# Patient Record
Sex: Female | Born: 1960 | Race: Black or African American | Hispanic: No | Marital: Married | Smoking: Current some day smoker
Health system: Southern US, Community
[De-identification: ages and names within clinical notes are randomized; demographics above are authoritative.]

## PROBLEM LIST (undated history)

## (undated) DIAGNOSIS — R42 Dizziness and giddiness: Secondary | ICD-10-CM

## (undated) DIAGNOSIS — G4730 Sleep apnea, unspecified: Secondary | ICD-10-CM

## (undated) DIAGNOSIS — I10 Essential (primary) hypertension: Secondary | ICD-10-CM

## (undated) DIAGNOSIS — E119 Type 2 diabetes mellitus without complications: Secondary | ICD-10-CM

## (undated) HISTORY — PX: UMBILICAL HERNIA REPAIR: SHX196

## (undated) HISTORY — PX: HERNIA REPAIR: SHX51

## (undated) HISTORY — PX: ABDOMINAL HYSTERECTOMY: SHX81

---

## 2004-03-20 ENCOUNTER — Other Ambulatory Visit: Payer: Self-pay

## 2004-12-01 ENCOUNTER — Emergency Department: Payer: Self-pay | Admitting: Emergency Medicine

## 2004-12-01 ENCOUNTER — Other Ambulatory Visit: Payer: Self-pay

## 2005-02-25 ENCOUNTER — Emergency Department: Payer: Self-pay | Admitting: Internal Medicine

## 2005-02-25 ENCOUNTER — Other Ambulatory Visit: Payer: Self-pay

## 2007-04-19 ENCOUNTER — Emergency Department: Payer: Self-pay | Admitting: Internal Medicine

## 2007-04-19 ENCOUNTER — Other Ambulatory Visit: Payer: Self-pay

## 2009-03-13 ENCOUNTER — Emergency Department: Payer: Self-pay | Admitting: Unknown Physician Specialty

## 2013-11-15 ENCOUNTER — Emergency Department: Payer: Self-pay | Admitting: Emergency Medicine

## 2013-11-15 LAB — URINALYSIS, COMPLETE
Bacteria: NONE SEEN
Bilirubin,UR: NEGATIVE
Blood: NEGATIVE
Glucose,UR: NEGATIVE mg/dL (ref 0–75)
Ketone: NEGATIVE
Leukocyte Esterase: NEGATIVE
Nitrite: NEGATIVE
Ph: 5 (ref 4.5–8.0)
Protein: NEGATIVE
RBC,UR: 1 /HPF (ref 0–5)
Specific Gravity: 1.024 (ref 1.003–1.030)
Squamous Epithelial: 1
WBC UR: 2 /HPF (ref 0–5)

## 2014-06-06 DIAGNOSIS — K429 Umbilical hernia without obstruction or gangrene: Secondary | ICD-10-CM | POA: Insufficient documentation

## 2014-07-06 ENCOUNTER — Ambulatory Visit: Payer: Self-pay | Admitting: Surgery

## 2014-07-06 LAB — POTASSIUM: Potassium: 3.7 mmol/L (ref 3.5–5.1)

## 2014-07-14 ENCOUNTER — Ambulatory Visit: Payer: Self-pay | Admitting: Surgery

## 2014-08-04 ENCOUNTER — Emergency Department: Payer: Self-pay | Admitting: Emergency Medicine

## 2014-08-04 LAB — CBC WITH DIFFERENTIAL/PLATELET
Basophil #: 0 10*3/uL (ref 0.0–0.1)
Basophil %: 0.4 %
Eosinophil #: 0.4 10*3/uL (ref 0.0–0.7)
Eosinophil %: 3.6 %
HCT: 44.4 % (ref 35.0–47.0)
HGB: 14.6 g/dL (ref 12.0–16.0)
Lymphocyte #: 5.2 10*3/uL — ABNORMAL HIGH (ref 1.0–3.6)
Lymphocyte %: 47.5 %
MCH: 28.1 pg (ref 26.0–34.0)
MCHC: 32.9 g/dL (ref 32.0–36.0)
MCV: 86 fL (ref 80–100)
Monocyte #: 0.8 x10 3/mm (ref 0.2–0.9)
Monocyte %: 7 %
Neutrophil #: 4.5 10*3/uL (ref 1.4–6.5)
Neutrophil %: 41.5 %
Platelet: 113 10*3/uL — ABNORMAL LOW (ref 150–440)
RBC: 5.19 10*6/uL (ref 3.80–5.20)
RDW: 14.4 % (ref 11.5–14.5)
WBC: 10.9 10*3/uL (ref 3.6–11.0)

## 2014-08-04 LAB — COMPREHENSIVE METABOLIC PANEL
Albumin: 3.2 g/dL — ABNORMAL LOW (ref 3.4–5.0)
Alkaline Phosphatase: 77 U/L (ref 46–116)
Anion Gap: 5 — ABNORMAL LOW (ref 7–16)
BUN: 11 mg/dL (ref 7–18)
Bilirubin,Total: 0.3 mg/dL (ref 0.2–1.0)
Calcium, Total: 9 mg/dL (ref 8.5–10.1)
Chloride: 105 mmol/L (ref 98–107)
Co2: 31 mmol/L (ref 21–32)
Creatinine: 0.8 mg/dL (ref 0.60–1.30)
EGFR (African American): >60
EGFR (Non-African Amer.): >60
Glucose: 113 mg/dL — ABNORMAL HIGH (ref 65–99)
Osmolality: 281 (ref 275–301)
Potassium: 4 mmol/L (ref 3.5–5.1)
SGOT(AST): 36 U/L (ref 15–37)
SGPT (ALT): 36 U/L (ref 14–63)
Sodium: 141 mmol/L (ref 136–145)
Total Protein: 7.7 g/dL (ref 6.4–8.2)

## 2014-08-07 LAB — WOUND CULTURE

## 2014-08-13 ENCOUNTER — Emergency Department: Payer: Self-pay | Admitting: Student

## 2014-10-29 NOTE — Op Note (Signed)
PATIENT NAME:  Karen SlickerAYLOR, Karen Potts MR#:  [id number] DATE OF BIRTH:  24-Oct-1960  DATE OF PROCEDURE:  07/14/2014  PREOPERATIVE DIAGNOSIS: Symptomatic umbilical hernia.   POSTOPERATIVE DIAGNOSIS: Symptomatic umbilical hernia.    PROCEDURE: Umbilical hernia repair.   SURGEON: Adella HareJ. Wilton Lela Gell, MD   ANESTHESIA: General.   INDICATIONS FOR PROCEDURE: This 54 year old female has recently felt a bulge at the navel and just slightly to the right of the midline, had some moderate painful discomfort and umbilical mass was palpated, and surgery was recommended for definitive treatment.   DESCRIPTION OF PROCEDURE: The patient was placed on the operating table in the supine position under general endotracheal anesthesia. The abdomen was very obese and was prepared with ChloraPrep, and applied ChloraPrep down deeply within the navel, and found that the navel appeared to be clean. The abdomen was draped in a sterile manner.   A transversely oriented supraumbilical incision was made some 7 cm in length, carried down deeply within the subcutaneous tissues, and I could further identify the extent of the navel. Dissection was carried down to the deep fascia. There was an umbilical hernia sac, which appeared to be approximately 4 cm in dimension and appeared to contain fluid, and was dissected free from surrounding structures up to the fascial ring defect. The defect itself appeared to be small in size. The sac was opened and suctioned. There was no bowel within the sac. A high ligation of the sac was done with a 0 Surgilon suture ligature. The fascial ring defect was only about 6 mm, and it was dilated with large enough to allow reduction of the stump of the hernia sac. Next, a repair was carried out with a 0 Surgilon figure-of-eight suture, which resolved the fascial defect. Next tissues surrounding the repair were infiltrated with 0.5% Sensorcaine with epinephrine, also subcutaneous tissues were infiltrated. The wound was  inspected and determined that hemostasis was intact. The skin was closed with a running 4-0 Monocryl subcuticular suture and LiquiBand. The patient appeared to tolerate the procedure satisfactorily and was prepared for transfer to the recovery room.    ____________________________ Shela CommonsJ. Renda RollsWilton Caidyn Henricksen, MD [email or web address] D: 07/14/2014 17:54:50 ET T: 07/14/2014 18:26:18 ET JOB#: [id number]  cc: Adella HareJ. Wilton Shyam Dawson, MD, <Dictator> Adella HareWILTON J Karter Hellmer MD ELECTRONICALLY SIGNED 07/15/2014 10:45

## 2015-02-15 ENCOUNTER — Emergency Department
Admission: EM | Admit: 2015-02-15 | Discharge: 2015-02-15 | Disposition: A | Discharge status: Home / Self Care | Payer: Worker's Compensation | Attending: Emergency Medicine | Admitting: Emergency Medicine

## 2015-02-15 ENCOUNTER — Encounter: Payer: Self-pay | Admitting: Emergency Medicine

## 2015-02-15 DIAGNOSIS — Y990 Civilian activity done for income or pay: Secondary | ICD-10-CM | POA: Diagnosis not present

## 2015-02-15 DIAGNOSIS — T1501XA Foreign body in cornea, right eye, initial encounter: Secondary | ICD-10-CM | POA: Diagnosis not present

## 2015-02-15 DIAGNOSIS — Y9289 Other specified places as the place of occurrence of the external cause: Secondary | ICD-10-CM | POA: Insufficient documentation

## 2015-02-15 DIAGNOSIS — Y9389 Activity, other specified: Secondary | ICD-10-CM | POA: Diagnosis not present

## 2015-02-15 DIAGNOSIS — X58XXXA Exposure to other specified factors, initial encounter: Secondary | ICD-10-CM | POA: Insufficient documentation

## 2015-02-15 DIAGNOSIS — T1591XA Foreign body on external eye, part unspecified, right eye, initial encounter: Secondary | ICD-10-CM

## 2015-02-15 DIAGNOSIS — Z720 Tobacco use: Secondary | ICD-10-CM | POA: Diagnosis not present

## 2015-02-15 MED ORDER — HYDROCODONE-ACETAMINOPHEN 5-325 MG PO TABS: 1.0000 | ORAL_TABLET | ORAL | Status: DC | PRN

## 2015-02-15 NOTE — ED Notes (Signed)
Patient to ED with report of getting metal in eye from work yesterday, patient reports has seen employee clinic and has been using eye wash most of day. Redness noted to right eye.

## 2015-02-15 NOTE — Discharge Instructions (Signed)
Eye, Foreign Body The term foreign body refers to any object near, on the surface of or in the eye that should not be there. A foreign body may be a small speck of dirt or dust, a hair or eyelash, a splinter or any object. CAUSES  Foreign bodies can get in the eye by:  Flying pieces of something that was broken or destroyed (debris).  A sudden injury (trauma) to the eye. SYMPTOMS  Symptoms depend on what the foreign body is and where it is in the eye. The most common locations are:  On the inner surface of the upper or lower eyelids or on the covering of the white part of the eye (conjunctiva). Symptoms in this location are:  Irritating and painful, especially when blinking.  Feeling like something is in the eye.  On the surface of the clear covering on the front of the eye (cornea). A corneal foreign body has symptoms that:  Are painful and irritating since the cornea is very sensitive.  Form small "rust rings" around a metallic foreign body. Metallic foreign bodies stick more firmly to the surface of the cornea.  Inside the eyeball. Infection can happen fast and can be hard to treat with antibiotics. This is an extremely dangerous situation. Foreign bodies inside the eye can threaten vision. A person may even loose their eye. Foreign bodies inside the eye may cause:  Great pain.  Immediate loss of vision. DIAGNOSIS  Foreign bodies are found during an exam by an eye specialist. Those that are on the eyelids, conjunctiva or cornea are usually (but not always) easily found. When a foreign body is inside the eyeball, a cataract may form almost right away. This makes it hard for an ophthalmologist to find the foreign body. Special tests may be needed, including ultrasound testing, X-rays and CT scans. TREATMENT   Foreign bodies that are on the eyelids, conjunctiva or cornea are often removed easily and painlessly.  If the foreign body has caused a scratch or abrasion of the cornea,  antibiotic drops, ointments and/or a tight patch called a "pressure patch" may be needed. Follow-up exams will be needed for several days until the abrasion heals.  Surgery is needed right away if the foreign body is inside the eyeball. This is a medical emergency. An antibiotic therapy will likely be given to stop an infection. HOME CARE INSTRUCTIONS  The use of eye patches is not universal. Their use varies from state to state and from caregiver to caregiver. If an eye patch was applied:  Keep the eye patch on for as long as directed by your caregiver until the follow-up appointment.  Do not remove the patch to put in medications unless instructed to do so. When replacing the patch, retape it as it was before. Follow the same procedure if the patch becomes loose.  WARNING: Do not drive or operate machinery while the eye is patched. The ability to judge distances will be impaired.  Only take over-the-counter or prescription medicines for pain, discomfort or fever as directed by the caregiver. If no eye patch was applied:  Keep the eye closed as much as possible. Do not rub the eye.  Wear dark glasses as needed to protect the eyes from bright light.  Do not wear contact lenses until the eye feels normal again, or as instructed.  Wear protective eye covering if there is a risk of eye injury. This is important when working with high speed tools.  Only take over-the-counter or   prescription medicines for pain, discomfort or fever as directed by the caregiver. SEEK IMMEDIATE MEDICAL CARE IF:   Pain increases in the eye or the vision changes.  You or your child has problems with the eye patch.  The injury to the eye appears to be getting larger.  There is discharge from the injured eye.  Swelling and/or soreness (inflammation) develops around the affected eye.  You or your child has an oral temperature above 102 F (38.9 C), not controlled by medicine.  Your baby is older than 3  months with a rectal temperature of 102 F (38.9 C) or higher.  Your baby is 3 months old or younger with a rectal temperature of 100.4 F (38 C) or higher. MAKE SURE YOU:   Understand these instructions.  Will watch your condition.  Will get help right away if you are not doing well or get worse. Document Released: 06/16/2005 Document Revised: 09/08/2011 Document Reviewed: 11/11/2012 ExitCare Patient Information 2015 [employer]. This information is not intended to replace advice given to you by your health care provider. Make sure you discuss any questions you have with your health care provider.  

## 2015-02-15 NOTE — ED Provider Notes (Signed)
[facility] Emergency Department Provider Note  ____________________________________________  Time seen: Approximately 6:07 PM  I have reviewed the triage vital signs and the nursing notes.   HISTORY  Chief Complaint Foreign Body in Eye    HPI Karen Potts is a 54 y.o. 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 Patient complains of continuous right hip pain. Despite irrigation   History reviewed. No pertinent past medical history.  There are no active problems to display for this patient.   History reviewed. No pertinent past surgical history.  Current Outpatient Rx  Name  Route  Sig  Dispense  Refill  . HYDROcodone-acetaminophen (NORCO) 5-325 MG per tablet   Oral   Take 1-2 tablets by mouth every 4 (four) hours as needed for moderate pain.   15 tablet   0     Allergies Review of patient's allergies indicates no known allergies.  History reviewed. No pertinent family history.  Social History Social History  Substance Use Topics  . Smoking status: Current Every Day Smoker  . Smokeless tobacco: None  . Alcohol Use: No    Review of Systems Constitutional: No fever/chills Eyes: No visual changes. Positive right eye irritation with redness. ENT: No sore throat. Cardiovascular: Denies chest pain. Respiratory: Denies shortness of breath. Gastrointestinal: No abdominal pain.  No nausea, no vomiting.  No diarrhea.  No constipation. Genitourinary: Negative for dysuria. Musculoskeletal: Negative for back pain. Skin: Negative for rash. Neurological: Negative for headaches, focal weakness or numbness.  10-point ROS otherwise negative.  ____________________________________________   PHYSICAL EXAM:  VITAL SIGNS: ED Triage Vitals  Enc Vitals Group     BP 02/15/15 1632 168/95 mmHg     Pulse Rate 02/15/15 1632 59     Resp 02/15/15 1632 18     Temp 02/15/15 1632  98.5 F (36.9 C)     Temp Source 02/15/15 1632 Oral     SpO2 02/15/15 1632 98 %     Weight 02/15/15 1632 300 lb (136.079 kg)     Height 02/15/15 1632  (1.702 m)     Head Cir --      Peak Flow --      Pain Score 02/15/15 1633 7     Pain Loc --      Pain Edu? --      Excl. in GC? --     Constitutional: Alert and oriented. Well appearing and in no acute distress. Eyes: Conjunctivae are normal. PERRL. EOMI. cornea with noted foreign body embedded at approximately 12:00 minimal rust ring noted. Neurologic:  Normal speech and language. No gross focal neurologic deficits are appreciated. No gait instability. Skin:  Skin is warm, dry and intact. No rash noted. Psychiatric: Mood and affect are normal. Speech and behavior are normal.  ____________________________________________   LABS (all labs ordered are listed, but only abnormal results are displayed)  Labs Reviewed - No data to display ____________________________________________  PROCEDURES  Procedure(s) performed: None  Critical Care performed: No  ____________________________________________   INITIAL IMPRESSION / ASSESSMENT AND PLAN / ED COURSE  Pertinent labs & imaging results that were available during my care of the patient were reviewed by me and considered in my medical decision making (see chart for details).  Tetracaine applied to the right eye with improvement in pain. Rust ring noted referred to ophthalmology on-call. Patient to be seen first thing in the morning at the medical  office. She voices understanding. Rx given for hydrocodone as needed for pain and follow up with Dr. Georgianne Fick. ____________________________________________   FINAL CLINICAL IMPRESSION(S) / ED DIAGNOSES  Final diagnoses:  Foreign body of right eye, initial encounter      Evangeline Dakin, PA-C 02/15/15 1809  Phineas Semen, MD 02/15/15 2141

## 2015-03-27 ENCOUNTER — Encounter: Payer: Self-pay | Admitting: Emergency Medicine

## 2015-03-27 ENCOUNTER — Emergency Department
Admission: EM | Admit: 2015-03-27 | Discharge: 2015-03-27 | Disposition: A | Discharge status: Home / Self Care | Payer: PRIVATE HEALTH INSURANCE | Attending: Emergency Medicine | Admitting: Emergency Medicine

## 2015-03-27 DIAGNOSIS — Z720 Tobacco use: Secondary | ICD-10-CM | POA: Diagnosis not present

## 2015-03-27 DIAGNOSIS — K6289 Other specified diseases of anus and rectum: Secondary | ICD-10-CM | POA: Diagnosis present

## 2015-03-27 DIAGNOSIS — I10 Essential (primary) hypertension: Secondary | ICD-10-CM | POA: Diagnosis not present

## 2015-03-27 DIAGNOSIS — K649 Unspecified hemorrhoids: Secondary | ICD-10-CM | POA: Diagnosis not present

## 2015-03-27 HISTORY — DX: Essential (primary) hypertension: I10

## 2015-03-27 MED ORDER — LIDOCAINE-HYDROCORTISONE ACE 3-1 % RE KIT: 1.0000 "application " | PACK | Freq: Two times a day (BID) | RECTAL | Status: DC

## 2015-03-27 NOTE — Discharge Instructions (Signed)
Please seek medical attention for any high fevers, chest pain, shortness of breath, change in behavior, persistent vomiting, bloody stool or any other new or concerning symptoms. ° ° ° °Hemorrhoids °Hemorrhoids are swollen veins around the rectum or anus. There are two types of hemorrhoids:  °· Internal hemorrhoids. These occur in the veins just inside the rectum. They may poke through to the outside and become irritated and painful. °· External hemorrhoids. These occur in the veins outside the anus and can be felt as a painful swelling or hard lump near the anus. °CAUSES °· Pregnancy.   °· Obesity.   °· Constipation or diarrhea.   °· Straining to have a bowel movement.   °· Sitting for long periods on the toilet. °· Heavy lifting or other activity that caused you to strain. °· Anal intercourse. °SYMPTOMS  °· Pain.   °· Anal itching or irritation.   °· Rectal bleeding.   °· Fecal leakage.   °· Anal swelling.   °· One or more lumps around the anus.   °DIAGNOSIS  °Your caregiver may be able to diagnose hemorrhoids by visual examination. Other examinations or tests that may be performed include:  °· Examination of the rectal area with a gloved hand (digital rectal exam).   °· Examination of anal canal using a small tube (scope).   °· A blood test if you have lost a significant amount of blood. °· A test to look inside the colon (sigmoidoscopy or colonoscopy). °TREATMENT °Most hemorrhoids can be treated at home. However, if symptoms do not seem to be getting better or if you have a lot of rectal bleeding, your caregiver may perform a procedure to help make the hemorrhoids get smaller or remove them completely. Possible treatments include:  °· Placing a rubber band at the base of the hemorrhoid to cut off the circulation (rubber band ligation).   °· Injecting a chemical to shrink the hemorrhoid (sclerotherapy).   °· Using a tool to burn the hemorrhoid (infrared light therapy).   °· Surgically removing the hemorrhoid  (hemorrhoidectomy).   °· Stapling the hemorrhoid to block blood flow to the tissue (hemorrhoid stapling).   °HOME CARE INSTRUCTIONS  °· Eat foods with fiber, such as whole grains, beans, nuts, fruits, and vegetables. Ask your doctor about taking products with added fiber in them (fiber supplements). °· Increase fluid intake. Drink enough water and fluids to keep your urine clear or pale yellow.   °· Exercise regularly.   °· Go to the bathroom when you have the urge to have a bowel movement. Do not wait.   °· Avoid straining to have bowel movements.   °· Keep the anal area dry and clean. Use wet toilet paper or moist towelettes after a bowel movement.   °· Medicated creams and suppositories may be used or applied as directed.   °· Only take over-the-counter or prescription medicines as directed by your caregiver.   °· Take warm sitz baths for 15-20 minutes, 3-4 times a day to ease pain and discomfort.   °· Place ice packs on the hemorrhoids if they are tender and swollen. Using ice packs between sitz baths may be helpful.   °¨ Put ice in a plastic bag.   °¨ Place a towel between your skin and the bag.   °¨ Leave the ice on for 15-20 minutes, 3-4 times a day.   °· Do not use a donut-shaped pillow or sit on the toilet for long periods. This increases blood pooling and pain.   °SEEK MEDICAL CARE IF: °· You have increasing pain and swelling that is not controlled by treatment or medicine. °· You have uncontrolled bleeding. °· You   have difficulty or you are unable to have a bowel movement. °· You have pain or inflammation outside the area of the hemorrhoids. °MAKE SURE YOU: °· Understand these instructions. °· Will watch your condition. °· Will get help right away if you are not doing well or get worse. °Document Released: 06/13/2000 Document Revised: 06/02/2012 Document Reviewed: 04/20/2012 °ExitCare® Patient Information ©2015 [employer]. This information is not intended to replace advice given to you by your health  care provider. Make sure you discuss any questions you have with your health care provider. ° °

## 2015-03-27 NOTE — ED Notes (Signed)
MD at bedside. 

## 2015-03-27 NOTE — ED Notes (Signed)
Pt arrived to the ED for complaints of rectal "burning and pain." Pt reports that her husband looked and states that it was ren and something hanging out. Pt also reports having diarrhea for 2 days.

## 2015-03-27 NOTE — ED Provider Notes (Signed)
[facility] Emergency Department Provider Note    ____________________________________________  Time seen: 2115  I have reviewed the triage vital signs and the nursing notes.   HISTORY  Chief Complaint Hemorrhoids  History limited by: Not Limited   HPI Karen Potts is a 54 y.o. female who presents to the emergency department today with concerns for hemorrhoids. The patient states that she first noticed something coming out and pain to her rectum today. This has been somewhat constant throughout the day. She states she has never had hemorrhoids in the past. She does state for the past couple of days she has had watery diarrhea. She states because of this she has been a longer time on the toilet than normal. The patient denies any GI bleeding or bleeding on wiping. She denies any fevers, nausea or vomiting.  Past Medical History  Diagnosis Date  . Hypertension     There are no active problems to display for this patient.   Past Surgical History  Procedure Laterality Date  . Umbilical hernia repair    . Abdominal hysterectomy      Current Outpatient Rx  Name  Route  Sig  Dispense  Refill  . HYDROcodone-acetaminophen (NORCO) 5-325 MG per tablet   Oral   Take 1-2 tablets by mouth every 4 (four) hours as needed for moderate pain.   15 tablet   0     Allergies Review of patient's allergies indicates no known allergies.  History reviewed. No pertinent family history.  Social History Social History  Substance Use Topics  . Smoking status: Current Every Day Smoker  . Smokeless tobacco: None  . Alcohol Use: No    Review of Systems  Constitutional: Negative for fever. Cardiovascular: Negative for chest pain. Respiratory: Negative for shortness of breath. Gastrointestinal: Negative for abdominal pain, vomiting. Positive for diarrhea Genitourinary: Negative for dysuria. Musculoskeletal: Negative for back pain. Skin: Negative for  rash. Neurological: Negative for headaches, focal weakness or numbness.  10-point ROS otherwise negative.  ____________________________________________   PHYSICAL EXAM:  VITAL SIGNS: ED Triage Vitals  Enc Vitals Group     BP 03/27/15 2054 155/68 mmHg     Pulse Rate 03/27/15 2054 55     Resp 03/27/15 2054 18     Temp 03/27/15 2054 97.9 F (36.6 C)     Temp src --      SpO2 03/27/15 2054 95 %     Weight 03/27/15 2054 295 lb (133.811 kg)     Height 03/27/15 2054  (1.702 m)     Head Cir --      Peak Flow --      Pain Score 03/27/15 2056 5   Constitutional: Alert and oriented. Well appearing and in no distress. Eyes: Conjunctivae are normal. PERRL. Normal extraocular movements. ENT   Head: Normocephalic and atraumatic.   Nose: No congestion/rhinnorhea.   Mouth/Throat: Mucous membranes are moist.   Neck: No stridor. Hematological/Lymphatic/Immunilogical: No cervical lymphadenopathy. Cardiovascular: Normal rate, regular rhythm.  No murmurs, rubs, or gallops. Respiratory: Normal respiratory effort without tachypnea nor retractions. Breath sounds are clear and equal bilaterally. No wheezes/rales/rhonchi. Gastrointestinal: Soft and nontender. No distention. There is no CVA tenderness. Rectal: Small hemorrhoid present, no evidence of thrombosis. No bleeding.  Musculoskeletal: Normal range of motion in all extremities. No joint effusions.   Neurologic:  Normal speech and language. No gross focal neurologic deficits are appreciated. Speech is normal.  Skin:  Skin is warm, dry and intact. No rash  noted. Psychiatric: Mood and affect are normal. Speech and behavior are normal. Patient exhibits appropriate insight and judgment.  ____________________________________________    LABS (pertinent positives/negatives)  None  ____________________________________________   EKG  None  ____________________________________________     RADIOLOGY  None   ____________________________________________   PROCEDURES  Procedure(s) performed: None  Critical Care performed: No  ____________________________________________   INITIAL IMPRESSION / ASSESSMENT AND PLAN / ED COURSE  Pertinent labs & imaging results that were available during my care of the patient were reviewed by me and considered in my medical decision making (see chart for details).  Patient presents to the emergency department today with concern for hemorrhoid. Physical exam is consistent with non thrombosed hemorrhoid. Discussed hemorrhoid care with patient.   ____________________________________________   FINAL CLINICAL IMPRESSION(S) / ED DIAGNOSES  Hemorrhoid  Phineas Semen, MD 03/27/15 2126

## 2016-02-06 ENCOUNTER — Other Ambulatory Visit: Payer: Self-pay | Admitting: Family Medicine

## 2016-02-07 NOTE — Telephone Encounter (Signed)
[employer] pt. Karen DillonEmily Drozdowski, CMA

## 2016-05-09 ENCOUNTER — Encounter: Payer: Self-pay | Admitting: Family Medicine

## 2016-08-14 ENCOUNTER — Other Ambulatory Visit: Payer: Self-pay | Admitting: Family Medicine

## 2016-08-14 DIAGNOSIS — I10 Essential (primary) hypertension: Secondary | ICD-10-CM

## 2016-08-14 MED ORDER — LISINOPRIL-HYDROCHLOROTHIAZIDE 10-12.5 MG PO TABS: 1.0000 | ORAL_TABLET | Freq: Every day | ORAL | 6 refills | Status: DC

## 2016-08-14 NOTE — Progress Notes (Signed)
Sent refill of BP meds to her pharmacy. Will continue [facility] with routine labs regularly.

## 2016-10-20 ENCOUNTER — Emergency Department
Admission: EM | Admit: 2016-10-20 | Discharge: 2016-10-21 | Disposition: A | Discharge status: Home / Self Care | Payer: PRIVATE HEALTH INSURANCE | Attending: Emergency Medicine | Admitting: Emergency Medicine

## 2016-10-20 ENCOUNTER — Emergency Department: Payer: PRIVATE HEALTH INSURANCE

## 2016-10-20 DIAGNOSIS — Z79899 Other long term (current) drug therapy: Secondary | ICD-10-CM | POA: Diagnosis not present

## 2016-10-20 DIAGNOSIS — I10 Essential (primary) hypertension: Secondary | ICD-10-CM | POA: Diagnosis not present

## 2016-10-20 DIAGNOSIS — F17200 Nicotine dependence, unspecified, uncomplicated: Secondary | ICD-10-CM | POA: Diagnosis not present

## 2016-10-20 DIAGNOSIS — R0602 Shortness of breath: Secondary | ICD-10-CM | POA: Diagnosis not present

## 2016-10-20 DIAGNOSIS — R0781 Pleurodynia: Secondary | ICD-10-CM | POA: Insufficient documentation

## 2016-10-20 DIAGNOSIS — R079 Chest pain, unspecified: Secondary | ICD-10-CM

## 2016-10-20 LAB — BASIC METABOLIC PANEL
Anion gap: 4 — ABNORMAL LOW (ref 5–15)
BUN: 17 mg/dL (ref 6–20)
CO2: 30 mmol/L (ref 22–32)
Calcium: 9.3 mg/dL (ref 8.9–10.3)
Chloride: 105 mmol/L (ref 101–111)
Creatinine, Ser: 0.78 mg/dL (ref 0.44–1.00)
GFR calc Af Amer: >60 mL/min (ref >60)
GFR calc non Af Amer: >60 mL/min (ref >60)
Glucose, Bld: 111 mg/dL — ABNORMAL HIGH (ref 65–99)
Potassium: 4.3 mmol/L (ref 3.5–5.1)
Sodium: 139 mmol/L (ref 135–145)

## 2016-10-20 LAB — HEPATIC FUNCTION PANEL
ALT: 17 U/L (ref 14–54)
AST: 39 U/L (ref 15–41)
Albumin: 3.8 g/dL (ref 3.5–5.0)
Alkaline Phosphatase: 54 U/L (ref 38–126)
Bilirubin, Direct: 0.3 mg/dL (ref 0.1–0.5)
Indirect Bilirubin: 0.6 mg/dL (ref 0.3–0.9)
Total Bilirubin: 0.9 mg/dL (ref 0.3–1.2)
Total Protein: 7.7 g/dL (ref 6.5–8.1)

## 2016-10-20 LAB — BRAIN NATRIURETIC PEPTIDE: B Natriuretic Peptide: 19 pg/mL (ref 0.0–100.0)

## 2016-10-20 LAB — FIBRIN DERIVATIVES D-DIMER (ARMC ONLY): Fibrin derivatives D-dimer (ARMC): 461.85 (ref 0.00–499.00)

## 2016-10-20 LAB — TROPONIN I: Troponin I: <0.03 ng/mL (ref <0.03)

## 2016-10-20 MED ORDER — KETOROLAC TROMETHAMINE 30 MG/ML IJ SOLN
15.0000 mg | Freq: Once | INTRAMUSCULAR | Status: AC
  Administered 2016-10-20: 15 mg via INTRAVENOUS
  Filled 2016-10-20: qty 1

## 2016-10-20 NOTE — ED Notes (Signed)
Pt arrived via ems for c/o chest pain on the left side of her chest with no radiation - pt denies N/V but does c/o shortness of breath with exertion - ems gave  of ASA and 1 ntg spray in route

## 2016-10-20 NOTE — ED Triage Notes (Signed)
Pt arrived via ems for c/o chest pain on the left side of her chest with no radiation - pt denies N/V but does c/o shortness of breath with exertion - ems gave 324mg of ASA and 1 ntg spray in route 

## 2016-10-20 NOTE — ED Provider Notes (Signed)
[facility] Emergency Department Provider Note  ____________________________________________   First MD Initiated Contact with Patient 10/20/16 2047     (approximate)  I have reviewed the triage vital signs and the nursing notes.   HISTORY  Chief Complaint Chest Pain    HPI Karen Potts is a 56 y.o. female who comes to the emergency department via EMS with left-sided sharp pleuritic chest pain that began today roughly around 7 PM. The pain is sharp under her left breast lasting seconds to a minute at a time and worse with deep inspiration. It is nonexertional. She does not have shortness of breath. No history of DVT or pulmonary embolism and reports no recent surgeries travel or immobilization. She does say that she's had some swelling and discomfort in her left leg recently. She's never had a heart attack or stroke. The pain is sharp moderate to severe.   Past Medical History:  Diagnosis Date  . Hypertension     There are no active problems to display for this patient.   Past Surgical History:  Procedure Laterality Date  . ABDOMINAL HYSTERECTOMY    . UMBILICAL HERNIA REPAIR      Prior to Admission medications   Medication Sig Start Date End Date Taking? Authorizing Provider  HYDROcodone-acetaminophen (NORCO) 5-325 MG per tablet Take 1-2 tablets by mouth every 4 (four) hours as needed for moderate pain. 02/15/15   Pierce Crane Beers, PA-C  lidocaine-hydrocortisone (ANAMANTLE) 3-1 % KIT Place 1 application rectally 2 (two) times daily. 03/27/15   Nance Pear, MD  lisinopril-hydrochlorothiazide (PRINZIDE,ZESTORETIC) 10-12.5 MG tablet Take 1 tablet by mouth daily. 08/14/16   [place], PA    Allergies Patient has no known allergies.  No family history on file.  Social History Social History  Substance Use Topics  . Smoking status: Current Every Day Smoker  . Smokeless tobacco: Never Used  . Alcohol use No    Review of  Systems Constitutional: No fever/chills Eyes: No visual changes. ENT: No sore throat. Cardiovascular: Positive chest pain. Respiratory: Denies shortness of breath. Gastrointestinal: No abdominal pain.  No nausea, no vomiting.  No diarrhea.  No constipation. Genitourinary: Negative for dysuria. Musculoskeletal: Negative for back pain. Skin: Negative for rash. Neurological: Negative for headaches, focal weakness or numbness.  10-point ROS otherwise negative.  ____________________________________________   PHYSICAL EXAM:  VITAL SIGNS: ED Triage Vitals  Enc Vitals Group     BP 10/20/16 2042 (!) 190/93     Pulse Rate 10/20/16 2042 (!) 58     Resp 10/20/16 2042 16     Temp 10/20/16 2042 97.5 F (36.4 C)     Temp Source 10/20/16 2042 Oral     SpO2 10/20/16 2042 99 %     Weight 10/20/16 2043 286 lb (129.7 kg)     Height 10/20/16 2043 _0  (1.702 m)     Head Circumference --      Peak Flow --      Pain Score 10/20/16 2042 2     Pain Loc --      Pain Edu? --      Excl. in [place]? --     Constitutional: Alert and oriented x 4 well appearing nontoxic no diaphoresis speaks in full, clear sentences Eyes: PERRL EOMI. Head: Atraumatic. Nose: No congestion/rhinnorhea. Mouth/Throat: No trismus Neck: No stridor.  Able to lie completely flat with no jugular venous distention Cardiovascular: Bradycardic rate, regular rhythm. Grossly normal heart sounds.  Good peripheral circulation. Respiratory:  Normal respiratory effort.  No retractions. Lungs CTAB and moving good air Gastrointestinal: Soft nondistended nontender no rebound or guarding no peritonitis Musculoskeletal: No lower extremity edema  left lower extremity is slightly tender to the touch but not warm Neurologic:  Normal speech and language. No gross focal neurologic deficits are appreciated. Skin:  Skin is warm, dry and intact. No rash noted. Psychiatric: Mood and affect are normal. Speech and behavior are  normal.    ____________________________________________   DIFFERENTIAL  Acute coronary syndrome, pulmonary embolism, pleuritic chest pain, Boerhaave syndrome, aortic dissection ____________________________________________   LABS (all labs ordered are listed, but only abnormal results are displayed)  Labs Reviewed  BASIC METABOLIC PANEL - Abnormal; Notable for the following:       Result Value   Glucose, Bld 111 (*)    Anion gap 4 (*)    All other components within normal limits  CBC - Abnormal; Notable for the following:    WBC 12.3 (*)    All other components within normal limits  TROPONIN I  HEPATIC FUNCTION PANEL  FIBRIN DERIVATIVES D-DIMER ([facility] ONLY)  BRAIN NATRIURETIC PEPTIDE  TROPONIN I  First troponin negative and d-dimer is less than 500 suggestive of no pulmonary embolism __________________________________________  EKG  ED ECG REPORT I, Darel Hong, the attending physician, personally viewed and interpreted this ECG.  Date: 10/20/2016 Rate: 51 Rhythm: normal sinus rhythm QRS Axis: normal Intervals: normal ST/T Wave abnormalities: normal Conduction Disturbances: none Narrative Interpretation: Borderline  ____________________________________________  RADIOLOGY  Chest x-ray with no acute disease ____________________________________________   PROCEDURES  Procedure(s) performed: no  Procedures  Critical Care performed: no  Observation: yes  ----------------------------------------- 9:21 PM on 10/20/2016 -----------------------------------------   OBSERVATION CARE: This patient is being placed under observation care for the following reasons: Chest pain with repeat testing to rule out ischemia   2230: The patient's pain is somewhat improved and she is reassured to know that her d-dimer is negative and first troponin is negative. She understands that a single troponin does not rule out myocardial ischemia and that she still requires the  second one.  ____________________________________________   INITIAL IMPRESSION / ASSESSMENT AND PLAN / ED COURSE  Pertinent labs & imaging results that were available during my care of the patient were reviewed by me and considered in my medical decision making (see chart for details).  On arrival the patient has somewhat atypical chest pain, however she does have a history of hypertension and she and her family seem quite concerned about the pain which is concerning. She received 324 mg of aspirin en route and a spray of nitroglycerin which did help somewhat. At this point differential is broad but most prominently includes acute coronary syndrome and pulmonary embolism. She will require at least 2 troponins and I believe she is low risk enough that a d-dimer would be able to rule out pulmonary embolism.      ____________________________________________   FINAL CLINICAL IMPRESSION(S) / ED DIAGNOSES  Final diagnoses:  Chest pain, unspecified type      NEW MEDICATIONS STARTED DURING THIS VISIT:  New Prescriptions   No medications on file     Note:  This document was prepared using Dragon voice recognition software and may include unintentional dictation errors.     Darel Hong, MD 10/20/16 [phone]

## 2016-10-21 LAB — CBC
HCT: 42.1 % (ref 35.0–47.0)
Hemoglobin: 14 g/dL (ref 12.0–16.0)
MCH: 28.4 pg (ref 26.0–34.0)
MCHC: 33.2 g/dL (ref 32.0–36.0)
MCV: 85.5 fL (ref 80.0–100.0)
Platelets: 96 10*3/uL — ABNORMAL LOW (ref 150–440)
RBC: 4.92 MIL/uL (ref 3.80–5.20)
RDW: 14.5 % (ref 11.5–14.5)
WBC: 12.3 10*3/uL — ABNORMAL HIGH (ref 3.6–11.0)

## 2016-10-21 LAB — TROPONIN I: Troponin I: <0.03 ng/mL (ref <0.03)

## 2016-10-21 NOTE — ED Provider Notes (Addendum)
-----------------------------------------   2:19 AM on 10/21/2016 -----------------------------------------   Blood pressure (!) 140/58, pulse (!) 54, temperature 97.5 F (36.4 C), temperature source Oral, resp. rate 16, height  (1.702 m), weight 286 lb (129.7 kg), SpO2 96 %.  Assuming care from Dr. Lamont Snowball.  In short, Karen Potts is a 56 y.o. female with a chief complaint of Chest Pain .  Refer to the original H&P for additional details.  The current plan of care is to follow up the results of the troponin.  The patient's repeat  Is unremarkable. The patient will be discharged to follow-up with her primary care physician      ----------------------------------------- 2:26 AM on 10/21/2016 -----------------------------------------   END OF OBSERVATION STATUS: After an appropriate period of observation, this patient is being discharged due to the following reason(s):  Negative troponin and no pain    Karen Apley, MD 10/21/16 1610    Karen Apley, MD 10/21/16 [phone]

## 2016-10-21 NOTE — Discharge Instructions (Signed)
Please follow-up with cardiology for further evaluation of your chest pain. At this time your chest pain is unremarkable.

## 2017-01-06 ENCOUNTER — Other Ambulatory Visit: Payer: Self-pay | Admitting: Family Medicine

## 2017-01-06 DIAGNOSIS — N63 Unspecified lump in unspecified breast: Secondary | ICD-10-CM

## 2017-01-15 ENCOUNTER — Other Ambulatory Visit: Payer: Self-pay | Admitting: Family Medicine

## 2017-01-15 DIAGNOSIS — N63 Unspecified lump in unspecified breast: Secondary | ICD-10-CM

## 2017-03-09 ENCOUNTER — Ambulatory Visit
Admission: RE | Admit: 2017-03-09 | Discharge: 2017-03-09 | Disposition: A | Discharge status: Home / Self Care | Payer: Self-pay | Source: Ambulatory Visit | Attending: Family Medicine | Admitting: Family Medicine

## 2017-03-09 DIAGNOSIS — N63 Unspecified lump in unspecified breast: Secondary | ICD-10-CM

## 2017-05-01 ENCOUNTER — Other Ambulatory Visit: Payer: Self-pay | Admitting: Family Medicine

## 2017-06-10 ENCOUNTER — Other Ambulatory Visit: Payer: Self-pay

## 2017-06-10 ENCOUNTER — Emergency Department: Payer: Worker's Compensation

## 2017-06-10 ENCOUNTER — Emergency Department
Admission: EM | Admit: 2017-06-10 | Discharge: 2017-06-10 | Disposition: A | Discharge status: Home / Self Care | Payer: Worker's Compensation | Attending: Emergency Medicine | Admitting: Emergency Medicine

## 2017-06-10 DIAGNOSIS — Z79899 Other long term (current) drug therapy: Secondary | ICD-10-CM | POA: Diagnosis not present

## 2017-06-10 DIAGNOSIS — Y929 Unspecified place or not applicable: Secondary | ICD-10-CM | POA: Insufficient documentation

## 2017-06-10 DIAGNOSIS — S43402A Unspecified sprain of left shoulder joint, initial encounter: Secondary | ICD-10-CM

## 2017-06-10 DIAGNOSIS — Y990 Civilian activity done for income or pay: Secondary | ICD-10-CM | POA: Diagnosis not present

## 2017-06-10 DIAGNOSIS — W000XXA Fall on same level due to ice and snow, initial encounter: Secondary | ICD-10-CM | POA: Diagnosis not present

## 2017-06-10 DIAGNOSIS — F17200 Nicotine dependence, unspecified, uncomplicated: Secondary | ICD-10-CM | POA: Insufficient documentation

## 2017-06-10 DIAGNOSIS — Y9301 Activity, walking, marching and hiking: Secondary | ICD-10-CM | POA: Insufficient documentation

## 2017-06-10 DIAGNOSIS — S4992XA Unspecified injury of left shoulder and upper arm, initial encounter: Secondary | ICD-10-CM | POA: Diagnosis present

## 2017-06-10 DIAGNOSIS — I10 Essential (primary) hypertension: Secondary | ICD-10-CM | POA: Insufficient documentation

## 2017-06-10 MED ORDER — NAPROXEN 500 MG PO TABS: 500.0000 mg | ORAL_TABLET | Freq: Two times a day (BID) | ORAL | 2 refills | Status: DC

## 2017-06-10 MED ORDER — KETOROLAC TROMETHAMINE 30 MG/ML IJ SOLN
30.0000 mg | Freq: Once | INTRAMUSCULAR | Status: AC
  Administered 2017-06-10: 30 mg via INTRAMUSCULAR
  Filled 2017-06-10: qty 1

## 2017-06-10 NOTE — ED Notes (Signed)
Pt states she slipped and fell on the ice walking into work. Pt states she landing on the right side, c/o right lower back/hip pain, pt ambulatory. Pt also c/o left shoulder pain with limited movement..Marland Kitchen

## 2017-06-10 NOTE — ED Triage Notes (Signed)
Pt to ER via POV c/o slip and trip on ice. Right hip pain and left shoulder pain since. Pt alert and oriented X4, active, cooperative, pt in NAD. RR even and unlabored, color WNL.

## 2017-06-10 NOTE — ED Notes (Signed)
Pt fall is a WC. Pt stated that she "does not need to pee at this moment." Pt aware to use call bell when does have urge to urinate.

## 2017-06-10 NOTE — ED Provider Notes (Signed)
[facility] Emergency Department Provider Note   ____________________________________________    I have reviewed the triage vital signs and the nursing notes.   HISTORY  Chief Complaint Fall     HPI Karen Potts is a 56 y.o. female who presents for fall.  Patient reports she slipped on ice at work and fell onto her side injuring her left shoulder.  She reports her shoulder is painful primarily at the [facility] joint and it is painful to lift her arm above 90 degrees.  She has put an ice pack on this with little improvement.  Fall occurred just prior to arrival no other injuries reported.  No neck pain.  No chest pain.  No abdominal pain.  No back pain.   Past Medical History:  Diagnosis Date  . Hypertension     There are no active problems to display for this patient.   Past Surgical History:  Procedure Laterality Date  . ABDOMINAL HYSTERECTOMY    . UMBILICAL HERNIA REPAIR      Prior to Admission medications   Medication Sig Start Date End Date Taking? Authorizing Provider  lisinopril-hydrochlorothiazide (PRINZIDE,ZESTORETIC) 10-12.5 MG tablet TAKE 1 TABLET BY MOUTH ONCE DAILY 05/01/17  Yes Chrismon, Vickki Muff, PA  HYDROcodone-acetaminophen (NORCO) 5-325 MG per tablet Take 1-2 tablets by mouth every 4 (four) hours as needed for moderate pain. Patient not taking: Reported on 06/10/2017 02/15/15   Beers, Pierce Crane, PA-C  lidocaine-hydrocortisone (ANAMANTLE) 3-1 % KIT Place 1 application rectally 2 (two) times daily. Patient not taking: Reported on 06/10/2017 03/27/15   Nance Pear, MD  naproxen (NAPROSYN) 500 MG tablet Take 1 tablet (500 mg total) by mouth 2 (two) times daily with a meal. 06/10/17   Lavonia Drafts, MD     Allergies Patient has no known allergies.  No family history on file.  Social History Social History   Tobacco Use  . Smoking status: Current Every Day Smoker  . Smokeless tobacco: Never Used  Substance Use Topics  .  Alcohol use: No  . Drug use: No    Review of Systems  Constitutional: No dizziness  ENT: No neck pain   Gastrointestinal: No abdominal pain.  No nausea, no vomiting.   Genitourinary: Negative for dysuria. Musculoskeletal: Negative for back pain.  Shoulder pain as above skin: Negative for laceration Neurological: Negative for headaches     ____________________________________________   PHYSICAL EXAM:  VITAL SIGNS: ED Triage Vitals  Enc Vitals Group     BP 06/10/17 1006 (!) 166/63     Pulse Rate 06/10/17 1006 (!) 59     Resp 06/10/17 1006 13     Temp 06/10/17 1006 97.7 F (36.5 C)     Temp Source 06/10/17 1006 Oral     SpO2 06/10/17 1006 100 %     Weight 06/10/17 1006 127 kg (280 lb)     Height 06/10/17 1006 1.702 m (_0 )     Head Circumference --      Peak Flow --      Pain Score 06/10/17 1008 7     Pain Loc --      Pain Edu? --      Excl. in [place]? --     Constitutional: Alert and oriented. No acute distress.  Eyes: Conjunctivae are normal.  Head: Atraumatic. Nose: No congestion/rhinnorhea. Mouth/Throat: Mucous membranes are moist.   Cardiovascular: Normal rate, regular rhythm.  Respiratory: Normal respiratory effort.  No retractions.  Musculoskeletal: Patient is able  to lift her left arm to approximately 90 degrees although it is uncomfortable for her, it is too painful for her to lift above 90 degrees.  She has tenderness palpation over the [facility] joint.  No bony abnormalities felt.  Clavicles normal.  No vertebral tenderness Neurologic:  Normal speech and language. No gross focal neurologic deficits are appreciated.   Skin:  Skin is warm, dry and intact. No rash noted.   ____________________________________________   LABS (all labs ordered are listed, but only abnormal results are displayed)  Labs Reviewed - No data to display ____________________________________________  EKG   ____________________________________________  RADIOLOGY  Left shoulder  x-ray ____________________________________________   PROCEDURES  Procedure(s) performed: No  Procedures   Critical Care performed: No ____________________________________________   INITIAL IMPRESSION / ASSESSMENT AND PLAN / ED COURSE  Pertinent labs & imaging results that were available during my care of the patient were reviewed by me and considered in my medical decision making (see chart for details).  Patient presents after a fall with left shoulder pain. Differential includes AC separation, muscle strain, rotator cuff tear  X-ray negative for bony injury.  Patient given intramuscular Toradol and sling, outpatient follow-up with Ortho  ____________________________________________   FINAL CLINICAL IMPRESSION(S) / ED DIAGNOSES  Final diagnoses:  Sprain of left shoulder, unspecified shoulder sprain type, initial encounter      NEW MEDICATIONS STARTED DURING THIS VISIT:  This SmartLink is deprecated. Use AVSMEDLIST instead to display the medication list for a patient.   Note:  This document was prepared using Dragon voice recognition software and may include unintentional dictation errors.    Lavonia Drafts, MD 06/10/17 1218

## 2017-06-30 DIAGNOSIS — I252 Old myocardial infarction: Secondary | ICD-10-CM

## 2017-06-30 HISTORY — DX: Old myocardial infarction: I25.2

## 2017-07-03 ENCOUNTER — Other Ambulatory Visit: Payer: Self-pay | Admitting: Physician Assistant

## 2017-07-03 DIAGNOSIS — Z77018 Contact with and (suspected) exposure to other hazardous metals: Secondary | ICD-10-CM

## 2017-07-03 DIAGNOSIS — M25512 Pain in left shoulder: Secondary | ICD-10-CM

## 2017-07-08 ENCOUNTER — Ambulatory Visit
Admission: RE | Admit: 2017-07-08 | Discharge: 2017-07-08 | Disposition: A | Discharge status: Home / Self Care | Payer: Worker's Compensation | Source: Ambulatory Visit | Attending: Physician Assistant | Admitting: Physician Assistant

## 2017-07-08 DIAGNOSIS — M25512 Pain in left shoulder: Secondary | ICD-10-CM

## 2017-07-08 DIAGNOSIS — Z77018 Contact with and (suspected) exposure to other hazardous metals: Secondary | ICD-10-CM

## 2017-07-28 ENCOUNTER — Ambulatory Visit: Payer: Self-pay | Admitting: Orthopedic Surgery

## 2017-07-31 DIAGNOSIS — M75120 Complete rotator cuff tear or rupture of unspecified shoulder, not specified as traumatic: Secondary | ICD-10-CM | POA: Insufficient documentation

## 2017-08-07 ENCOUNTER — Other Ambulatory Visit: Payer: Self-pay

## 2017-08-07 ENCOUNTER — Encounter
Admission: RE | Admit: 2017-08-07 | Discharge: 2017-08-07 | Disposition: A | Discharge status: Home / Self Care | Payer: Worker's Compensation | Source: Ambulatory Visit | Attending: Orthopedic Surgery | Admitting: Orthopedic Surgery

## 2017-08-07 DIAGNOSIS — R9431 Abnormal electrocardiogram [ECG] [EKG]: Secondary | ICD-10-CM | POA: Insufficient documentation

## 2017-08-07 DIAGNOSIS — I10 Essential (primary) hypertension: Secondary | ICD-10-CM | POA: Diagnosis not present

## 2017-08-07 DIAGNOSIS — R001 Bradycardia, unspecified: Secondary | ICD-10-CM | POA: Diagnosis not present

## 2017-08-07 DIAGNOSIS — Z01818 Encounter for other preprocedural examination: Secondary | ICD-10-CM | POA: Insufficient documentation

## 2017-08-07 HISTORY — DX: Sleep apnea, unspecified: G47.30

## 2017-08-07 LAB — BASIC METABOLIC PANEL
Anion gap: 8 (ref 5–15)
BUN: 14 mg/dL (ref 6–20)
CO2: 26 mmol/L (ref 22–32)
Calcium: 9.1 mg/dL (ref 8.9–10.3)
Chloride: 106 mmol/L (ref 101–111)
Creatinine, Ser: 0.73 mg/dL (ref 0.44–1.00)
GFR calc Af Amer: >60 mL/min (ref >60)
GFR calc non Af Amer: >60 mL/min (ref >60)
Glucose, Bld: 110 mg/dL — ABNORMAL HIGH (ref 65–99)
Potassium: 3.8 mmol/L (ref 3.5–5.1)
Sodium: 140 mmol/L (ref 135–145)

## 2017-08-07 LAB — CBC
HCT: 42.5 % (ref 35.0–47.0)
Hemoglobin: 13.8 g/dL (ref 12.0–16.0)
MCH: 27.6 pg (ref 26.0–34.0)
MCHC: 32.6 g/dL (ref 32.0–36.0)
MCV: 84.9 fL (ref 80.0–100.0)
Platelets: 95 10*3/uL — ABNORMAL LOW (ref 150–440)
RBC: 5.01 MIL/uL (ref 3.80–5.20)
RDW: 14.9 % — ABNORMAL HIGH (ref 11.5–14.5)
WBC: 9.4 10*3/uL (ref 3.6–11.0)

## 2017-08-07 NOTE — Care Management (Signed)
EKG reviewed. P waves present. I don't think this is a junctional rhythm.

## 2017-08-07 NOTE — Patient Instructions (Signed)
Your procedure is scheduled on: Friday 08/14/17 Report to DAY SURGERY DEPARTMENT LOCATED ON 2ND FLOOR MEDICAL MALL ENTRANCE. To find out your arrival time please call [phone] between 1PM - 3PM on Thursday 08/13/17.  Remember: Instructions that are not followed completely may result in serious medical risk, up to and including death, or upon the discretion of your surgeon and anesthesiologist your surgery may need to be rescheduled.     _X__ 1. Do not eat food after midnight the night before your procedure.                 No gum chewing or hard candies. You may drink clear liquids up to 2 hours                 before you are scheduled to arrive for your surgery- DO not drink clear                 liquids within 2 hours of the start of your surgery.                 Clear Liquids include:  water, apple juice without pulp, clear carbohydrate                 drink such as Clearfast of Gartorade, Black Coffee or Tea (Do not add                 anything to coffee or tea).  __X__2.  On the morning of surgery brush your teeth with toothpaste and water, you                 may rinse your mouth with mouthwash if you wish.  Do not swallow any              toothpaste of mouthwash.     _X__ 3.  No Alcohol for 24 hours before or after surgery.   _X__ 4.  Do Not Smoke or use e-cigarettes For 24 Hours Prior to Your Surgery.                 Do not use any chewable tobacco products for at least 6 hours prior to                 surgery.  ____  5.  Bring all medications with you on the day of surgery if instructed.   __X__  6.  Notify your doctor if there is any change in your medical condition      (cold, fever, infections).     Do not wear jewelry, make-up, hairpins, clips or nail polish. Do not wear lotions, powders, or perfumes. You may wear deodorant. Do not shave 48 hours prior to surgery. Men may shave face and neck. Do not bring valuables to the hospital.    [facility] not [facility]  for any belongings or valuables.  Contacts, dentures or bridgework may not be worn into surgery. Leave your suitcase in the car. After surgery it may be brought to your room. For patients admitted to the hospital, discharge time is determined by your treatment team.   Patients discharged the day of surgery will not be allowed to drive home.   Please read over the following fact sheets that you were given:   MRSA Information   __X__ Take these medicines the morning of surgery with A SIP OF WATER:    1. NONE  2. YOU MAY TAKE YOUR TRAMADOL ONLY IF NEEDED  3.   4.  5.  6.  ____ Fleet Enema (as directed)   __X__ Use CHG Soap as directed  ____ Use inhalers on the day of surgery  ____ Stop metformin/Janumet/Farxiga 2 days prior to surgery    ____ Take 1/2 of usual insulin dose the night before surgery. No insulin the morning          of surgery.   ____ Stop Blood Thinners Coumadin/Plavix/Xarelto/Pleta/Pradaxa/Eliquis/Effient/Aspirin  on   __X__ Stop Anti-inflammatories such as Advil, Ibuprofen, Motrin, BC or Goodies  Powder, Naprosyn, Naproxen, Aleve  STOP MELOXICAM TODAY   __X__ Stop herbal supplements, fish oil or vitamin E until after surgery.    ____ Bring C-Pap to the hospital.

## 2017-08-13 MED ORDER — CEFAZOLIN SODIUM-DEXTROSE 2-4 GM/100ML-% IV SOLN
2.0000 g | INTRAVENOUS | Status: AC
  Administered 2017-08-14: 2 g via INTRAVENOUS

## 2017-08-14 ENCOUNTER — Encounter: Payer: Self-pay | Admitting: *Deleted

## 2017-08-14 ENCOUNTER — Ambulatory Visit
Admission: RE | Admit: 2017-08-14 | Discharge: 2017-08-14 | Disposition: A | Discharge status: Home / Self Care | Payer: Worker's Compensation | Source: Ambulatory Visit | Attending: Orthopedic Surgery | Admitting: Orthopedic Surgery

## 2017-08-14 ENCOUNTER — Encounter
Admission: RE | Disposition: A | Discharge status: Home / Self Care | Payer: Self-pay | Source: Ambulatory Visit | Attending: Orthopedic Surgery

## 2017-08-14 ENCOUNTER — Other Ambulatory Visit: Payer: Self-pay

## 2017-08-14 ENCOUNTER — Ambulatory Visit: Payer: Worker's Compensation | Admitting: Anesthesiology

## 2017-08-14 DIAGNOSIS — I10 Essential (primary) hypertension: Secondary | ICD-10-CM | POA: Diagnosis not present

## 2017-08-14 DIAGNOSIS — F17200 Nicotine dependence, unspecified, uncomplicated: Secondary | ICD-10-CM | POA: Diagnosis not present

## 2017-08-14 DIAGNOSIS — G4730 Sleep apnea, unspecified: Secondary | ICD-10-CM | POA: Diagnosis not present

## 2017-08-14 DIAGNOSIS — M75122 Complete rotator cuff tear or rupture of left shoulder, not specified as traumatic: Secondary | ICD-10-CM | POA: Insufficient documentation

## 2017-08-14 DIAGNOSIS — K219 Gastro-esophageal reflux disease without esophagitis: Secondary | ICD-10-CM | POA: Insufficient documentation

## 2017-08-14 DIAGNOSIS — M7542 Impingement syndrome of left shoulder: Secondary | ICD-10-CM | POA: Diagnosis not present

## 2017-08-14 DIAGNOSIS — Z79899 Other long term (current) drug therapy: Secondary | ICD-10-CM | POA: Insufficient documentation

## 2017-08-14 HISTORY — PX: SHOULDER ARTHROSCOPY WITH OPEN ROTATOR CUFF REPAIR: SHX6092

## 2017-08-14 SURGERY — ARTHROSCOPY, SHOULDER WITH REPAIR, ROTATOR CUFF, OPEN
Anesthesia: General | Laterality: Left

## 2017-08-14 MED ORDER — LIDOCAINE HCL (PF) 1 % IJ SOLN
INTRAMUSCULAR | Status: DC | PRN
  Administered 2017-08-14: .8 mL via SUBCUTANEOUS

## 2017-08-14 MED ORDER — EPINEPHRINE PF 1 MG/ML IJ SOLN
INTRAMUSCULAR | Status: AC
  Filled 2017-08-14: qty 1

## 2017-08-14 MED ORDER — LIDOCAINE HCL (PF) 1 % IJ SOLN
INTRAMUSCULAR | Status: AC
  Filled 2017-08-14: qty 5

## 2017-08-14 MED ORDER — LIDOCAINE HCL (CARDIAC) 20 MG/ML IV SOLN
INTRAVENOUS | Status: DC | PRN
  Administered 2017-08-14: 80 mg via INTRAVENOUS

## 2017-08-14 MED ORDER — MIDAZOLAM HCL 2 MG/2ML IJ SOLN
INTRAMUSCULAR | Status: AC
  Administered 2017-08-14: 1 mg via INTRAVENOUS
  Filled 2017-08-14: qty 2

## 2017-08-14 MED ORDER — PROPOFOL 10 MG/ML IV BOLUS
INTRAVENOUS | Status: DC | PRN
  Administered 2017-08-14: 180 mg via INTRAVENOUS

## 2017-08-14 MED ORDER — ROCURONIUM BROMIDE 100 MG/10ML IV SOLN
INTRAVENOUS | Status: DC | PRN
  Administered 2017-08-14: 50 mg via INTRAVENOUS

## 2017-08-14 MED ORDER — PROPOFOL 10 MG/ML IV BOLUS
INTRAVENOUS | Status: AC
  Filled 2017-08-14: qty 20

## 2017-08-14 MED ORDER — BUPIVACAINE HCL (PF) 0.5 % IJ SOLN
INTRAMUSCULAR | Status: DC | PRN
  Administered 2017-08-14: 10 mL via PERINEURAL

## 2017-08-14 MED ORDER — SUGAMMADEX SODIUM 200 MG/2ML IV SOLN
INTRAVENOUS | Status: DC | PRN
  Administered 2017-08-14: 200 mg via INTRAVENOUS

## 2017-08-14 MED ORDER — DEXAMETHASONE SODIUM PHOSPHATE 10 MG/ML IJ SOLN
INTRAMUSCULAR | Status: AC
  Filled 2017-08-14: qty 1

## 2017-08-14 MED ORDER — LACTATED RINGERS IV SOLN
INTRAVENOUS | Status: DC
  Administered 2017-08-14: 13:00:00 via INTRAVENOUS

## 2017-08-14 MED ORDER — BUPIVACAINE-EPINEPHRINE (PF) 0.25% -1:200000 IJ SOLN
INTRAMUSCULAR | Status: DC | PRN
  Administered 2017-08-14: 20 mL

## 2017-08-14 MED ORDER — CHLORHEXIDINE GLUCONATE 4 % EX LIQD: 60.0000 mL | Freq: Once | CUTANEOUS | Status: DC

## 2017-08-14 MED ORDER — FENTANYL CITRATE (PF) 100 MCG/2ML IJ SOLN
INTRAMUSCULAR | Status: DC | PRN
  Administered 2017-08-14: 25 ug via INTRAVENOUS
  Administered 2017-08-14: 50 ug via INTRAVENOUS
  Administered 2017-08-14: 25 ug via INTRAVENOUS

## 2017-08-14 MED ORDER — ACETAMINOPHEN 500 MG PO TABS
1000.0000 mg | ORAL_TABLET | Freq: Once | ORAL | Status: AC
  Administered 2017-08-14: 1000 mg via ORAL

## 2017-08-14 MED ORDER — BUPIVACAINE HCL (PF) 0.25 % IJ SOLN
INTRAMUSCULAR | Status: AC
  Filled 2017-08-14: qty 30

## 2017-08-14 MED ORDER — FENTANYL CITRATE (PF) 100 MCG/2ML IJ SOLN
INTRAMUSCULAR | Status: AC
  Administered 2017-08-14: 50 ug via INTRAVENOUS
  Filled 2017-08-14: qty 2

## 2017-08-14 MED ORDER — FENTANYL CITRATE (PF) 100 MCG/2ML IJ SOLN
50.0000 ug | Freq: Once | INTRAMUSCULAR | Status: AC
  Administered 2017-08-14: 50 ug via INTRAVENOUS

## 2017-08-14 MED ORDER — ONDANSETRON HCL 4 MG/2ML IJ SOLN
INTRAMUSCULAR | Status: DC | PRN
  Administered 2017-08-14: 4 mg via INTRAVENOUS

## 2017-08-14 MED ORDER — CEFAZOLIN SODIUM-DEXTROSE 2-4 GM/100ML-% IV SOLN
INTRAVENOUS | Status: AC
  Filled 2017-08-14: qty 100

## 2017-08-14 MED ORDER — ONDANSETRON HCL 4 MG/2ML IJ SOLN
INTRAMUSCULAR | Status: AC
  Filled 2017-08-14: qty 2

## 2017-08-14 MED ORDER — EPHEDRINE SULFATE 50 MG/ML IJ SOLN
INTRAMUSCULAR | Status: DC | PRN
  Administered 2017-08-14: 15 mg via INTRAVENOUS
  Administered 2017-08-14: 5 mg via INTRAVENOUS

## 2017-08-14 MED ORDER — BUPIVACAINE LIPOSOME 1.3 % IJ SUSP
INTRAMUSCULAR | Status: DC | PRN
  Administered 2017-08-14: 20 mL via PERINEURAL

## 2017-08-14 MED ORDER — HYDROCODONE-ACETAMINOPHEN 5-325 MG PO TABS: 1.0000 | ORAL_TABLET | ORAL | 0 refills | Status: DC | PRN

## 2017-08-14 MED ORDER — ACETAMINOPHEN 500 MG PO TABS
ORAL_TABLET | ORAL | Status: AC
  Administered 2017-08-14: 1000 mg via ORAL
  Filled 2017-08-14: qty 2

## 2017-08-14 MED ORDER — BUPIVACAINE LIPOSOME 1.3 % IJ SUSP
INTRAMUSCULAR | Status: AC
  Filled 2017-08-14: qty 20

## 2017-08-14 MED ORDER — MIDAZOLAM HCL 2 MG/2ML IJ SOLN
INTRAMUSCULAR | Status: AC
  Filled 2017-08-14: qty 2

## 2017-08-14 MED ORDER — MIDAZOLAM HCL 2 MG/2ML IJ SOLN
INTRAMUSCULAR | Status: AC
  Administered 2017-08-14: 2 mg via INTRAVENOUS
  Filled 2017-08-14: qty 2

## 2017-08-14 MED ORDER — EPINEPHRINE 30 MG/30ML IJ SOLN
INTRAMUSCULAR | Status: AC
  Filled 2017-08-14: qty 1

## 2017-08-14 MED ORDER — BUPIVACAINE HCL (PF) 0.5 % IJ SOLN
INTRAMUSCULAR | Status: AC
  Filled 2017-08-14: qty 10

## 2017-08-14 MED ORDER — FAMOTIDINE 20 MG PO TABS
20.0000 mg | ORAL_TABLET | Freq: Once | ORAL | Status: AC
  Administered 2017-08-14: 20 mg via ORAL

## 2017-08-14 MED ORDER — DEXAMETHASONE SODIUM PHOSPHATE 10 MG/ML IJ SOLN
INTRAMUSCULAR | Status: DC | PRN
  Administered 2017-08-14: 10 mg via INTRAVENOUS

## 2017-08-14 MED ORDER — MIDAZOLAM HCL 2 MG/2ML IJ SOLN
1.0000 mg | Freq: Once | INTRAMUSCULAR | Status: AC
  Administered 2017-08-14: 1 mg via INTRAVENOUS

## 2017-08-14 MED ORDER — ROCURONIUM BROMIDE 50 MG/5ML IV SOLN
INTRAVENOUS | Status: AC
  Filled 2017-08-14: qty 1

## 2017-08-14 MED ORDER — DOCUSATE SODIUM 100 MG PO CAPS: 100.0000 mg | ORAL_CAPSULE | Freq: Every day | ORAL | 2 refills | Status: DC | PRN

## 2017-08-14 MED ORDER — FAMOTIDINE 20 MG PO TABS
ORAL_TABLET | ORAL | Status: AC
  Administered 2017-08-14: 20 mg via ORAL
  Filled 2017-08-14: qty 1

## 2017-08-14 MED ORDER — MIDAZOLAM HCL 2 MG/2ML IJ SOLN
2.0000 mg | Freq: Once | INTRAMUSCULAR | Status: AC
  Administered 2017-08-14: 2 mg via INTRAVENOUS

## 2017-08-14 MED ORDER — LIDOCAINE HCL (PF) 2 % IJ SOLN
INTRAMUSCULAR | Status: AC
  Filled 2017-08-14: qty 10

## 2017-08-14 MED ORDER — FENTANYL CITRATE (PF) 100 MCG/2ML IJ SOLN
INTRAMUSCULAR | Status: AC
  Filled 2017-08-14: qty 2

## 2017-08-14 MED ORDER — MIDAZOLAM HCL 2 MG/2ML IJ SOLN
INTRAMUSCULAR | Status: DC | PRN
  Administered 2017-08-14: 2 mg via INTRAVENOUS

## 2017-08-14 SURGICAL SUPPLY — 59 items
ADAPTER IRRIG TUBE 2 SPIKE SOL (ADAPTER) ×4 IMPLANT
ANCHOR SUT BIO SW 4.75X19.1 (Anchor) ×2 IMPLANT
BLADE FULL RADIUS 3.5 (BLADE) ×2 IMPLANT
BLADE INCISOR PLUS 4.5 (BLADE) ×2 IMPLANT
BRUSH SCRUB EZ  4% CHG (MISCELLANEOUS) ×1
BRUSH SCRUB EZ 4% CHG (MISCELLANEOUS) ×1 IMPLANT
BUR BR 5.5 WIDE MOUTH (BURR) ×2 IMPLANT
CANNULA 5.75X7 CRYSTAL CLEAR (CANNULA) ×2 IMPLANT
CANNULA PARTIAL THREAD 2X7 (CANNULA) ×2 IMPLANT
CANNULA TWIST IN 8.25X9CM (CANNULA) ×2 IMPLANT
CHLORAPREP W/TINT 26ML (MISCELLANEOUS) ×2 IMPLANT
COOLER POLAR GLACIER W/PUMP (MISCELLANEOUS) IMPLANT
CRADLE LAMINECT ARM (MISCELLANEOUS) ×2 IMPLANT
DEVICE SUCT BLK HOLE OR FLOOR (MISCELLANEOUS) IMPLANT
DRAPE IMP U-DRAPE 54X76 (DRAPES) ×4 IMPLANT
DRAPE INCISE IOBAN 66X45 STRL (DRAPES) ×2 IMPLANT
DRAPE SHEET LG 3/4 BI-LAMINATE (DRAPES) ×2 IMPLANT
DRAPE STERI 35X30 U-POUCH (DRAPES) ×2 IMPLANT
DRAPE U-SHAPE 47X51 STRL (DRAPES) ×2 IMPLANT
ELECT REM PT RETURN 9FT ADLT (ELECTROSURGICAL) ×2
ELECTRODE REM PT RTRN 9FT ADLT (ELECTROSURGICAL) ×1 IMPLANT
GAUZE PETRO XEROFOAM 1X8 (MISCELLANEOUS) ×2 IMPLANT
GAUZE SPONGE 4X4 12PLY STRL (GAUZE/BANDAGES/DRESSINGS) ×2 IMPLANT
GLOVE SURG ORTHO 8.0 STRL STRW (GLOVE) ×2 IMPLANT
GOWN STRL REUS W/ TWL LRG LVL3 (GOWN DISPOSABLE) ×2 IMPLANT
GOWN STRL REUS W/ TWL XL LVL3 (GOWN DISPOSABLE) ×2 IMPLANT
GOWN STRL REUS W/TWL LRG LVL3 (GOWN DISPOSABLE) ×2
GOWN STRL REUS W/TWL XL LVL3 (GOWN DISPOSABLE) ×2
IV LACTATED RINGER IRRG 3000ML (IV SOLUTION) ×8
IV LR IRRIG 3000ML ARTHROMATIC (IV SOLUTION) ×8 IMPLANT
KIT STABILIZATION SHOULDER (MISCELLANEOUS) ×2 IMPLANT
KIT TURNOVER KIT A (KITS) ×2 IMPLANT
MANIFOLD NEPTUNE II (INSTRUMENTS) ×4 IMPLANT
MASK FACE SPIDER DISP (MASK) ×2 IMPLANT
MAT BLUE FLOOR 46X72 FLO (MISCELLANEOUS) ×2 IMPLANT
NDL SAFETY ECLIPSE 18X1.5 (NEEDLE) ×1 IMPLANT
NEEDLE HYPO 18GX1.5 SHARP (NEEDLE) ×1
NEEDLE HYPO 22GX1.5 SAFETY (NEEDLE) ×2 IMPLANT
NEEDLE SCORPION MULTI FIRE (NEEDLE) ×2 IMPLANT
NEEDLE SPNL 18GX3.5 QUINCKE PK (NEEDLE) ×2 IMPLANT
PACK ARTHROSCOPY SHOULDER (MISCELLANEOUS) ×2 IMPLANT
PAD ABD DERMACEA PRESS 5X9 (GAUZE/BANDAGES/DRESSINGS) IMPLANT
PAD WRAPON POLAR SHDR XLG (MISCELLANEOUS) ×1 IMPLANT
SET TUBE SUCT SHAVER OUTFL 24K (TUBING) ×2 IMPLANT
SET TUBE TIP INTRA-ARTICULAR (MISCELLANEOUS) ×2 IMPLANT
SLING ARM LRG DEEP (SOFTGOODS) IMPLANT
SPONGE XRAY 4X4 16PLY STRL (MISCELLANEOUS) IMPLANT
STRAP SAFETY 5IN WIDE (MISCELLANEOUS) ×2 IMPLANT
STRIP CLOSURE SKIN 1/4X4 (GAUZE/BANDAGES/DRESSINGS) IMPLANT
SUT ETHILON NAB PS2 4-0 18IN (SUTURE) ×2 IMPLANT
SUT FIBERWIRE #2 38 T-5 BLUE (SUTURE)
SUT TIGER TAPE 7 IN WHITE (SUTURE) ×2 IMPLANT
SUTURE FIBERWR #2 38 T-5 BLUE (SUTURE) IMPLANT
SYR 10ML LL (SYRINGE) ×2 IMPLANT
TAPE MICROFOAM 4IN (TAPE) ×2 IMPLANT
TUBING ARTHRO INFLOW-ONLY STRL (TUBING) ×2 IMPLANT
TUBING CONNECTING 10 (TUBING) ×2 IMPLANT
WAND HAND CNTRL MULTIVAC 90 (MISCELLANEOUS) ×2 IMPLANT
WRAPON POLAR PAD SHDR XLG (MISCELLANEOUS) ×2

## 2017-08-14 NOTE — H&P (Signed)
The patient has been re-examined, and the chart reviewed, and there have been no interval changes to the documented history and physical.  Plan a left shoulder rotator cuff repair today.  Anesthesia is consulted regarding a peripheral nerve block for post-operative pain.  The risks, benefits, and alternatives have been discussed at length, and the patient is willing to proceed.

## 2017-08-14 NOTE — Op Note (Signed)
08/14/2017  4:21 PM  PATIENT:  Karen Potts  57 y.o. 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    The risks benefits and alternatives were discussed with the patient preoperatively including but not limited to the risks of infection, bleeding, nerve injury, persistent pain or weakness, failure of the hardware, re-tear of the rotator cuff and the need for further surgery. Medical risks include DVT and pulmonary embolism, myocardial infarction, stroke, pneumonia, respiratory failure and death. Patient understood these risks and wished to proceed.  OPERATIVE IMPLANTS: Arthrex SpeedFix System  OPERATIVE PROCEDURE: The patient was met in the preoperative area. The left shoulder was signed with my initials according the hospital's correct site of surgery protocol. The patient is brought to the OR and underwent a supraclavicular block and general endotracheal intubation by the anesthesia service.  The patient was placed in a beachchair position. A spider arm positioner was used for this case. Examination under anesthesia revealed negative sulcus sign, full range of motion and crepitus.  The patient was prepped and draped in a sterile fashion. A timeout was performed to verify the patient's name, date of birth, medical record number, correct site of  surgery and correct procedure to be performed there was also used to verify the patient received antibiotics that all appropriate instruments, implants and radiographs studies were available in the room. Once all in attendance were in agreement case began.  Bony landmarks were drawn out with a surgical marker along with proposed arthroscopy incisions. These were pre-injected with 1% lidocaine plain. An 11 blade was used to establish a posterior portal through which the arthroscope was placed in the glenohumeral joint. A full diagnostic examination of the shoulder was performed. The anterior portal was established under direct visualization with an 18-gauge spinal needle.  A 5.75 mm arthroscopic cannula was placed through the anterior portal.   The intra-articular portion of the biceps tendon was found to have a partial tear involving greater than 50% of the diameter. Therefore the decision was made to perform a tenotomy. An electrocautery was used to release the biceps tendon off the superior labrum. The arthroscopic shaver was then used to debride the frayed edges of the labrum. There were no anterior or superior labral tears seen.  Subscapularis tendon was intact. Patient had a full-thickness tear involving the supraspinatus with retraction. There were no loose bodies within the inferior recess and no evidence of HAGL lesion.  The arthroscope was then placed in the subacromial space. A lateral portal was then established using an 18-gauge spinal needle for localization.   The greater tuberosity was debrided using a 5.5 mm resector shaver blade to remove all remaining foreign fibers of the rotator cuff.  Debridement was performed until punctate bleeding was seen at the greater tuberosity footprint, which will allow for rotator cuff healing.  Extensive bursitis was encountered and debrided using a 4-0 resector shaver blade and a 90 ArthroCare wand  from the lateral portal. Using the SpeedFix system with  FiberTape, the cuff was mobilized and the tape passed through the rotator cuff. The tape was then crossed in usual fashion and fixated on the lateral side with one SwiveLock anchor. The final construct was stable and moved as a unit with excellent coverage of the humeral head.  All incisions were copiously irrigated. Skin closure for the arthroscopic incisions was performed with 4-0 nylon.  A dry sterile dressing including Steri-Strips was applied . The patient was placed in an abduction sling.  All sharp and instrument counts were correct at the conclusion of the case. I was scrubbed and present for the entire case. I spoke with the patient's family in the post-op consultation room and informed them that the case had been performed without complication and the patient was stable in recovery room.   Kurtis Bushman, MD

## 2017-08-14 NOTE — Progress Notes (Signed)
Dr Randa Ngopiscitello in to see pt   Continues to state that she can not breath  Pt has had a block   Versed one mg given for anxiousness

## 2017-08-14 NOTE — Progress Notes (Signed)
More alert up to chair  Breathing sensation a little better

## 2017-08-14 NOTE — Anesthesia Post-op Follow-up Note (Signed)
Anesthesia QCDR form completed.        

## 2017-08-14 NOTE — Anesthesia Postprocedure Evaluation (Signed)
Anesthesia Post Note  Patient: Programmer, systemsJanifer R Slatten  Procedure(s) Performed: SHOULDER ARTHROSCOPY WITH ROTATOR CUFF REPAIR, BICEPS TENOTOMY, SUBACROMIAL DECOMPRESSION (Left )  Patient location during evaluation: PACU Anesthesia Type: General Level of consciousness: awake and alert Pain management: pain level controlled Vital Signs Assessment: post-procedure vital signs reviewed and stable Respiratory status: spontaneous breathing, nonlabored ventilation, respiratory function stable and patient connected to nasal cannula oxygen Cardiovascular status: blood pressure returned to baseline and stable Postop Assessment: no apparent nausea or vomiting Anesthetic complications: no Comments: Patient initially endorsed dyspnea while saturating 100% on room air which has since resolved.  No other symptoms. Pain well controlled.   Lungs clear bilaterally at that time.  It was explained to her that her dyspnea was probably from her nerve block and that it should resolve.     Last Vitals:  Vitals:   08/14/17 1724 08/14/17 1735  BP: (!) 160/56 (!) 147/63  Pulse: 60 60  Resp: 12 (!) 21  Temp:  (!) 36.2 C  SpO2: 100% 100%    Last Pain:  Vitals:   08/14/17 1735  TempSrc:   PainSc: 2                  Cleda MccreedyJoseph K Beverley Allender

## 2017-08-14 NOTE — Anesthesia Procedure Notes (Signed)
Anesthesia Regional Block: Interscalene brachial plexus block   Pre-Anesthetic Checklist: ,, timeout performed, Correct Patient, Correct Site, Correct Laterality, Correct Procedure, Correct Position, site marked, Risks and benefits discussed,  Surgical consent,  Pre-op evaluation,  At surgeon's request and post-op pain management  Laterality: Left  Prep: chloraprep       Needles:  Injection technique: Single-shot  Needle Type: Echogenic Stimulator Needle     Needle Length: 9cm  Needle Gauge: 21     Additional Needles:   Procedures:, nerve stimulator,,, ultrasound used (permanent image in chart),,,,   Nerve Stimulator or Paresthesia:  Response: biceps flexion, 0.8 mA,   Additional Responses:   Narrative:  Start time: 08/14/2017 1:20 PM End time: 08/14/2017 1:25 PM Injection made incrementally with aspirations every 5 mL.  Performed by: Personally   Additional Notes: Functioning IV was confirmed and monitors were applied.  A 50mm 22ga Stimuplex needle was used. Sterile prep and drape,hand hygiene and sterile gloves were used.  Negative aspiration and negative test dose prior to incremental administration of local anesthetic. The patient tolerated the procedure well.

## 2017-08-14 NOTE — Transfer of Care (Signed)
Immediate Anesthesia Transfer of Care Note  Patient: Karen SlickerJanifer R Potts  Procedure(s) Performed: SHOULDER ARTHROSCOPY WITH ROTATOR CUFF REPAIR, BICEPS TENOTOMY, SUBACROMIAL DECOMPRESSION (Left )  Patient Location: PACU  Anesthesia Type:General  Level of Consciousness: sedated  Airway & Oxygen Therapy: Patient Spontanous Breathing and Patient connected to face mask oxygen  Post-op Assessment: Report given to RN and Post -op Vital signs reviewed and stable  Post vital signs: Reviewed and stable  Last Vitals:  Vitals:   08/14/17 1344 08/14/17 1614  BP: (!) 117/54 (!) 165/58  Pulse: (!) 55 78  Resp: 15   Temp:  (!) 35.8 C  SpO2: 96% 100%    Last Pain:  Vitals:   08/14/17 1309  TempSrc:   PainSc: 0-No pain         Complications: No apparent anesthesia complications

## 2017-08-14 NOTE — Anesthesia Procedure Notes (Signed)
Procedure Name: Intubation Date/Time: 08/14/2017 2:05 PM Performed by: Eben Burow, CRNA Pre-anesthesia Checklist: Patient identified, Emergency Drugs available, Suction available, Patient being monitored and Timeout performed Patient Re-evaluated:Patient Re-evaluated prior to induction Oxygen Delivery Method: Circle system utilized Preoxygenation: Pre-oxygenation with 100% oxygen Induction Type: IV induction Ventilation: Mask ventilation without difficulty Laryngoscope Size: Miller and 2 Grade View: Grade I Tube type: Oral Tube size: 7.0 mm Number of attempts: 1 Airway Equipment and Method: Stylet and LTA kit utilized Placement Confirmation: ETT inserted through vocal cords under direct vision,  positive ETCO2 and breath sounds checked- equal and bilateral Secured at: 21 cm Tube secured with: Tape Dental Injury: Teeth and Oropharynx as per pre-operative assessment

## 2017-08-14 NOTE — Anesthesia Preprocedure Evaluation (Addendum)
Anesthesia Evaluation  Patient identified by MRN, date of birth, ID band Patient awake    Reviewed: Allergy & Precautions, H&P , NPO status , Patient's Chart, lab work & pertinent test results  History of Anesthesia Complications Negative for: history of anesthetic complications  Airway Mallampati: III  TM Distance: >3 FB Neck ROM: full    Dental  (+) Chipped, Poor Dentition   Pulmonary neg shortness of breath, sleep apnea and Continuous Positive Airway Pressure Ventilation , Current Smoker,           Cardiovascular Exercise Tolerance: Good hypertension, Pt. on medications (-) angina(-) Past MI      Neuro/Psych negative neurological ROS  negative psych ROS   GI/Hepatic negative GI ROS, Neg liver ROS, neg GERD  ,  Endo/Other  negative endocrine ROS  Renal/GU      Musculoskeletal   Abdominal   Peds  Hematology negative hematology ROS (+)   Anesthesia Other Findings Past Medical History: No date: Hypertension No date: Sleep apnea  Past Surgical History: No date: ABDOMINAL HYSTERECTOMY No date: UMBILICAL HERNIA REPAIR     Reproductive/Obstetrics negative OB ROS                           Anesthesia Physical Anesthesia Plan  ASA: III  Anesthesia Plan: General ETT   Post-op Pain Management: GA combined w/ Regional for post-op pain   Induction: Intravenous  PONV Risk Score and Plan: Ondansetron, Dexamethasone and Midazolam  Airway Management Planned: Oral ETT  Additional Equipment:   Intra-op Plan:   Post-operative Plan: Extubation in OR  Informed Consent: I have reviewed the patients History and Physical, chart, labs and discussed the procedure including the risks, benefits and alternatives for the proposed anesthesia with the patient or authorized representative who has indicated his/her understanding and acceptance.   Dental Advisory Given  Plan Discussed with:  Anesthesiologist, CRNA and Surgeon  Anesthesia Plan Comments: (Patient consented for risks of anesthesia including but not limited to:  - adverse reactions to medications - damage to teeth, lips or other oral mucosa - sore throat or hoarseness - Damage to heart, brain, lungs or loss of life  Patient voiced understanding.)        Anesthesia Quick Evaluation

## 2017-08-14 NOTE — Progress Notes (Signed)
Pt states that she can not breath  Lungs present posteriorly  resp slight shallow but pt sleepy   Oxygen sat 100  On room air

## 2017-08-14 NOTE — Discharge Instructions (Signed)
Apply ice to shoulder 20 minutes on 20 minutes off, use a light cloth between the ice and the skin Call with questions or concerns, such as fever, drainage or shortness of breath to Dr. Odis LusterBowers with [facility] at [phone] May perform elbow, wrist and hand range of motion and shoulder pendulums     Interscalene Nerve Block with Exparel  1.  For your surgery you have received an Interscalene Nerve Block with Exparel. 2. Nerve Blocks affect many types of nerves, including nerves that control movement, pain and normal sensation.  You may experience feelings such as numbness, tingling, heaviness, weakness or the inability to move your arm or the feeling or sensation that your arm has "fallen asleep". 3. A nerve block with Exparel can last up to 5 days.  Usually the weakness wears off first.  The tingling and heaviness usually wear off next.  Finally you may start to notice pain.  Keep in mind that this may occur in any order.  Once a nerve block starts to wear off it is usually completely gone within 60 minutes. 4. ISNB may cause mild shortness of breath, a hoarse voice, blurry vision, unequal pupils, or drooping of the face on the same side as the nerve block.  These symptoms will usually resolve with the numbness.  Very rarely the procedure itself can cause mild seizures. 5. If needed, your surgeon will give you a prescription for pain medication.  It will take about 60 minutes for the oral pain medication to become fully effective.  So, it is recommended that you start taking this medication before the nerve block first begins to wear off, or when you first begin to feel discomfort. 6. Take your pain medication only as prescribed.  Pain medication can cause sedation and decrease your breathing if you take more than you need for the level of pain that you have. 7. Nausea is a common side effect of many pain medications.  You may want to eat something before taking your pain medicine to prevent  nausea. 8. After an Interscalene nerve block, you cannot feel pain, pressure or extremes in temperature in the effected arm.  Because your arm is numb it is at an increased risk for injury.  To decrease the possibility of injury, please practice the following:  a. While you are awake change the position of your arm frequently to prevent too much pressure on any one area for prolonged periods of time. b.  If you have a cast or tight dressing, check the color or your fingers every couple of hours.  Call your surgeon with the appearance of any discoloration (white or blue). c. If you are given a sling to wear before you go home, please wear it  at all times until the block has completely worn off.  Do not get up at night without your sling. d. Please contact [facility] or your surgeon if you do not begin to regain sensation after 7 days from the surgery.  Anesthesia may be contacted by calling the Same Day Surgery Department, Mon. through Fri., 6 am to 4 pm at [phone].   e. If you experience any other problems or concerns, please contact your surgeon's office. f. If you experience severe or prolonged shortness of breath go to the nearest emergency department.

## 2017-08-17 ENCOUNTER — Encounter: Payer: Self-pay | Admitting: Orthopedic Surgery

## 2017-09-28 ENCOUNTER — Ambulatory Visit: Payer: Self-pay | Attending: Oncology

## 2017-09-28 VITALS — Ht 68.0 in | Wt 292.0 lb

## 2017-09-28 DIAGNOSIS — N63 Unspecified lump in unspecified breast: Secondary | ICD-10-CM

## 2017-09-28 NOTE — Progress Notes (Signed)
Subjective:     Patient ID: Karen Potts, female   DOB: May 26, 1961, 57 y.o.   MRN: [id number]  HPI   Review of Systems     Objective:   Physical Exam  Pulmonary/Chest: Right breast exhibits no inverted nipple, no mass, no nipple discharge, no skin change and no tenderness. Left breast exhibits no inverted nipple, no mass, no nipple discharge, no skin change and no tenderness. Breasts are symmetrical.    Scar from rotator cuff surgery February 2019       Assessment:     57 [facility]  Patient screened, and meets [facility] eligibility.  Patient does not have insurance, Medicare or Medicaid.  Handout given on Affordable Care Act.  Instructed patient on breast self awareness using teach back method.  Patient had rotator cuff surgery on left shoulder in February 2019.  She is unable to lift arm for exam.  [facility].  Patient needs to be able to lift arm for mammogram.  She is going for physical therapy.     Plan:     Karen Potts to schedule mammogram appointment for one month out.  Orders are in.

## 2017-10-28 HISTORY — PX: BREAST BIOPSY: SHX20

## 2017-11-03 ENCOUNTER — Other Ambulatory Visit: Payer: Self-pay

## 2017-11-13 ENCOUNTER — Other Ambulatory Visit: Payer: Self-pay | Admitting: *Deleted

## 2017-11-13 ENCOUNTER — Ambulatory Visit
Admission: RE | Admit: 2017-11-13 | Discharge: 2017-11-13 | Disposition: A | Discharge status: Home / Self Care | Payer: Self-pay | Source: Ambulatory Visit | Attending: Oncology | Admitting: Oncology

## 2017-11-13 DIAGNOSIS — N63 Unspecified lump in unspecified breast: Secondary | ICD-10-CM | POA: Insufficient documentation

## 2017-11-25 ENCOUNTER — Ambulatory Visit
Admission: RE | Admit: 2017-11-25 | Discharge: 2017-11-25 | Disposition: A | Discharge status: Home / Self Care | Payer: Self-pay | Source: Ambulatory Visit | Attending: Oncology | Admitting: Oncology

## 2017-11-25 DIAGNOSIS — N63 Unspecified lump in unspecified breast: Secondary | ICD-10-CM | POA: Insufficient documentation

## 2017-11-26 LAB — SURGICAL PATHOLOGY

## 2017-12-13 ENCOUNTER — Emergency Department: Payer: Medicaid Other

## 2017-12-13 ENCOUNTER — Other Ambulatory Visit: Payer: Self-pay

## 2017-12-13 ENCOUNTER — Encounter: Payer: Self-pay | Admitting: *Deleted

## 2017-12-13 ENCOUNTER — Emergency Department
Admission: EM | Admit: 2017-12-13 | Discharge: 2017-12-13 | Disposition: A | Discharge status: Home / Self Care | Payer: Medicaid Other | Attending: Emergency Medicine | Admitting: Emergency Medicine

## 2017-12-13 DIAGNOSIS — Z79899 Other long term (current) drug therapy: Secondary | ICD-10-CM | POA: Insufficient documentation

## 2017-12-13 DIAGNOSIS — R42 Dizziness and giddiness: Secondary | ICD-10-CM | POA: Diagnosis present

## 2017-12-13 DIAGNOSIS — F17210 Nicotine dependence, cigarettes, uncomplicated: Secondary | ICD-10-CM | POA: Diagnosis not present

## 2017-12-13 DIAGNOSIS — I10 Essential (primary) hypertension: Secondary | ICD-10-CM | POA: Diagnosis not present

## 2017-12-13 DIAGNOSIS — R1031 Right lower quadrant pain: Secondary | ICD-10-CM | POA: Insufficient documentation

## 2017-12-13 DIAGNOSIS — R109 Unspecified abdominal pain: Secondary | ICD-10-CM

## 2017-12-13 HISTORY — DX: Dizziness and giddiness: R42

## 2017-12-13 LAB — URINALYSIS, COMPLETE (UACMP) WITH MICROSCOPIC
Bilirubin Urine: NEGATIVE
Glucose, UA: NEGATIVE mg/dL
Hgb urine dipstick: NEGATIVE
Ketones, ur: NEGATIVE mg/dL
Leukocytes, UA: NEGATIVE
Nitrite: NEGATIVE
Protein, ur: NEGATIVE mg/dL
Specific Gravity, Urine: 1.012 (ref 1.005–1.030)
pH: 7 (ref 5.0–8.0)

## 2017-12-13 LAB — CBC
HCT: 43.1 % (ref 35.0–47.0)
Hemoglobin: 14.4 g/dL (ref 12.0–16.0)
MCH: 28.4 pg (ref 26.0–34.0)
MCHC: 33.4 g/dL (ref 32.0–36.0)
MCV: 85 fL (ref 80.0–100.0)
Platelets: 96 10*3/uL — ABNORMAL LOW (ref 150–440)
RBC: 5.07 MIL/uL (ref 3.80–5.20)
RDW: 14.8 % — ABNORMAL HIGH (ref 11.5–14.5)
WBC: 8.5 10*3/uL (ref 3.6–11.0)

## 2017-12-13 LAB — COMPREHENSIVE METABOLIC PANEL
ALT: 29 U/L (ref 14–54)
AST: 37 U/L (ref 15–41)
Albumin: 4 g/dL (ref 3.5–5.0)
Alkaline Phosphatase: 46 U/L (ref 38–126)
Anion gap: 8 (ref 5–15)
BUN: 10 mg/dL (ref 6–20)
CO2: 25 mmol/L (ref 22–32)
Calcium: 9.1 mg/dL (ref 8.9–10.3)
Chloride: 102 mmol/L (ref 101–111)
Creatinine, Ser: 0.64 mg/dL (ref 0.44–1.00)
GFR calc Af Amer: >60 mL/min (ref >60)
GFR calc non Af Amer: >60 mL/min (ref >60)
Glucose, Bld: 123 mg/dL — ABNORMAL HIGH (ref 65–99)
Potassium: 3.6 mmol/L (ref 3.5–5.1)
Sodium: 135 mmol/L (ref 135–145)
Total Bilirubin: 1 mg/dL (ref 0.3–1.2)
Total Protein: 7.7 g/dL (ref 6.5–8.1)

## 2017-12-13 LAB — LIPASE, BLOOD: Lipase: 30 U/L (ref 11–51)

## 2017-12-13 MED ORDER — MECLIZINE HCL 12.5 MG PO TABS: 12.5000 mg | ORAL_TABLET | Freq: Three times a day (TID) | ORAL | 1 refills | Status: AC | PRN

## 2017-12-13 MED ORDER — MECLIZINE HCL 25 MG PO TABS
25.0000 mg | ORAL_TABLET | Freq: Once | ORAL | Status: AC
  Administered 2017-12-13: 25 mg via ORAL
  Filled 2017-12-13: qty 1

## 2017-12-13 NOTE — Discharge Instructions (Addendum)
You were seen in the emergency room for abdominal pain and dizziness (vertigo). It is important that you follow up closely with your primary care doctor in the next couple of days.  Please return to the emergency room right away if you are to develop a fever, severe nausea, your pain becomes severe or worsens, you are unable to keep food down, begin vomiting any dark or bloody fluid, you develop any dark or bloody stools, feel dehydrated, or other new concerns or symptoms arise.

## 2017-12-13 NOTE — ED Provider Notes (Signed)
[facility] Emergency Department Provider Note   ____________________________________________   First MD Initiated Contact with Patient 12/13/17 1152     (approximate)  I have reviewed the triage vital signs and the nursing notes.   HISTORY  Chief Complaint Right flank discomfort and then dizziness   HPI Karen Potts is a 57 y.o. female history of hypertension and vertigo  Patient reports this morning she got up, around 930 she experienced a what feels like a cramp" that occurred in her right flank and right lower abdomen.  It was sharp in nature and then went away after about an hour.  During the episode she got up and felt "swimmy headed" reporting she felt slightly dizzy as though she was spinning.  She reports she has had the same with regard to the spinning treated and told it was vertigo in the past, last had this a couple years ago.  She reports that that did not concern her too much, but she was concerned because of the flank pain.  She reports she gets the pain off and on and she has had a few times in the last few years, and it comes as a very crampy hard to describe pain in the right abdomen.  It is since gone away.  Patient reports right now all of her symptoms are gone.  She is not having any nausea.  She did not have any vomiting.  She is not having any ongoing abdominal pain.  The dizziness seems to have resolved itself.  At the present time, reports everything seems much better, but she would like to know what is causing these episodes of pain in her abdomen that seem to come and go like this several times the last few years.  She never had any speech changes.  No numbness.  No weakness in the arms legs or face.  Past Medical History:  Diagnosis Date  . Hypertension   . Sleep apnea   . Vertigo     There are no active problems to display for this patient.   Past Surgical History:  Procedure Laterality Date  . ABDOMINAL HYSTERECTOMY      . SHOULDER ARTHROSCOPY WITH OPEN ROTATOR CUFF REPAIR Left 08/14/2017   Procedure: SHOULDER ARTHROSCOPY WITH ROTATOR CUFF REPAIR, BICEPS TENOTOMY, SUBACROMIAL DECOMPRESSION;  Surgeon: Lyndle Herrlich, MD;  Location: [facility] ORS;  Service: Orthopedics;  Laterality: Left;  . UMBILICAL HERNIA REPAIR      Prior to Admission medications   Medication Sig Start Date End Date Taking? Authorizing Provider  lisinopril-hydrochlorothiazide (PRINZIDE,ZESTORETIC) 10-12.5 MG tablet TAKE 1 TABLET BY MOUTH ONCE DAILY 05/01/17  Yes Chrismon, Jodell Cipro, PA  meloxicam (MOBIC) 15 MG tablet Take 15 mg by mouth daily.   Yes [provider]  docusate sodium (COLACE) 100 MG capsule Take 1 capsule (100 mg total) by mouth daily as needed. Patient not taking: Reported on 12/13/2017 08/14/17 08/14/18  Lyndle Herrlich, MD  HYDROcodone-acetaminophen (NORCO/VICODIN) 5-325 MG tablet Take 1 tablet by mouth every 4 (four) hours as needed for moderate pain. Patient not taking: Reported on 12/13/2017 08/14/17 08/14/18  Lyndle Herrlich, MD  meclizine (ANTIVERT) 12.5 MG tablet Take 1 tablet (12.5 mg total) by mouth 3 (three) times daily as needed for dizziness or nausea. 12/13/17   Sharyn Creamer, MD    Allergies Patient has no known allergies.  No family history on file.  Social History Social History   Tobacco Use  . Smoking status: Current Every  Day Smoker    Types: Cigarettes  . Smokeless tobacco: Never Used  Substance Use Topics  . Alcohol use: No  . Drug use: No    Review of Systems Constitutional: No fever/chills Eyes: No visual changes. ENT: No sore throat. Cardiovascular: Denies chest pain. Respiratory: Denies shortness of breath. Gastrointestinal: No nausea, no vomiting.  No diarrhea.  No constipation. Genitourinary: Negative for dysuria. Musculoskeletal: Negative for back pain. Skin: Negative for rash. Neurological: Negative for headaches, focal weakness or  numbness.    ____________________________________________   PHYSICAL EXAM:  VITAL SIGNS: ED Triage Vitals  Enc Vitals Group     BP 12/13/17 1141 (!) 167/74     Pulse Rate 12/13/17 1141 (!) 53     Resp 12/13/17 1141 16     Temp 12/13/17 1141 98.1 F (36.7 C)     Temp Source 12/13/17 1141 Oral     SpO2 12/13/17 1141 100 %     Weight 12/13/17 1143 300 lb (136.1 kg)     Height 12/13/17 1143 5\' 7"  (1.702 m)     Head Circumference --      Peak Flow --      Pain Score 12/13/17 1143 0     Pain Loc --      Pain Edu? --      Excl. in GC? --     Constitutional: Alert and oriented. Well appearing and in no acute distress. Eyes: Conjunctivae are normal. Head: Atraumatic. Nose: No congestion/rhinnorhea. Mouth/Throat: Mucous membranes are moist. Neck: No stridor.   Cardiovascular: Normal rate, regular rhythm. Grossly normal heart sounds.  Good peripheral circulation. Respiratory: Normal respiratory effort.  No retractions. Lungs CTAB. Gastrointestinal: Soft and nontender. No distention.  No pain at McBurney's point.  Negative Murphy.  Palpation in all quadrants reveals no pain or discomfort.  Patient reports all of her abdominal pain is completely gone now. Musculoskeletal: No lower extremity tenderness nor edema. Neurologic:  Normal speech and language. No gross focal neurologic deficits are appreciated.  Normal cranial nerve exam.  Extraocular movements are normal.  No observed ataxia.  Moves all extremities well 5 out of 5 strength normal sensation.  Clear speech. Skin:  Skin is warm, dry and intact. No rash noted. Psychiatric: Mood and affect are normal. Speech and behavior are normal.  ____________________________________________   LABS (all labs ordered are listed, but only abnormal results are displayed)  Labs Reviewed  CBC - Abnormal; Notable for the following components:      Result Value   RDW 14.8 (*)    Platelets 96 (*)    All other components within normal limits   URINALYSIS, COMPLETE (UACMP) WITH MICROSCOPIC - Abnormal; Notable for the following components:   Color, Urine YELLOW (*)    APPearance CLEAR (*)    Bacteria, UA RARE (*)    All other components within normal limits  COMPREHENSIVE METABOLIC PANEL - Abnormal; Notable for the following components:   Glucose, Bld 123 (*)    All other components within normal limits  LIPASE, BLOOD  CBG MONITORING, ED   ____________________________________________  EKG  Reviewed and entered by me at 11:45 AM Heart rate 50 QRS 100 QTc 430 Normal sinus rhythm, no evidence of acute ischemia. ____________________________________________  RADIOLOGY  CT head negative for acute  Patient reports all symptoms resolved.  No ongoing abdominal pain.  The present time with resolution of symptoms, no pain and blood work demonstrates I think the likelihood of an acute intra-abdominal process extremely low.  I do not see indication for CT or abdominal imaging at the present time. ____________________________________________   PROCEDURES  Procedure(s) performed: None  Procedures  Critical Care performed: No  ____________________________________________   INITIAL IMPRESSION / ASSESSMENT AND PLAN / ED COURSE  Pertinent labs & imaging results that were available during my care of the patient were reviewed by me and considered in my medical decision making (see chart for details).  Patient returns for evaluation of what she describes as an episode of dizziness and vertigo seem to be connected with an episode of a crampy right-sided abdominal pain that is now resolved.  Very reassuring exam with no ongoing abdominal pain, reassuring lab work normal white count.  She also reports her dizziness and vertigo seem to have resolved now as well.  She does have a history of vertigo and she has a normal neurologic examination without evidence of central process.  CT of the head negative for acute, no evidence of  intracranial hemorrhage, old stroke or mass lesion.  Suspect the abdominal pain may be something crampy in nature, cannot exclude kidney stone though no blood seen in the urine and her symptoms are completely resolved now.  ----------------------------------------- 3:39 PM on 12/13/2017 -----------------------------------------  Patient reports she remains well.  No ongoing pain or symptoms.  No abdominal pain, vertigo has gone away.  Comfortable with plan for discharge close follow-up and I discussed with her and her family careful return precautions regarding both abdominal pain and dizziness.  Return precautions and treatment recommendations and follow-up discussed with the patient who is agreeable with the plan.       ____________________________________________   FINAL CLINICAL IMPRESSION(S) / ED DIAGNOSES  Final diagnoses:  Vertigo  Right flank pain      NEW MEDICATIONS STARTED DURING THIS VISIT:  New Prescriptions   MECLIZINE (ANTIVERT) 12.5 MG TABLET    Take 1 tablet (12.5 mg total) by mouth 3 (three) times daily as needed for dizziness or nausea.     Note:  This document was prepared using Dragon voice recognition software and may include unintentional dictation errors.     Sharyn CreamerQuale, Tiana Sivertson, MD 12/13/17 1540

## 2017-12-13 NOTE — ED Notes (Signed)
Patient taken to MRI from ultrasound.

## 2017-12-13 NOTE — ED Triage Notes (Signed)
Per EMS report, patient was awakened at 0930 this morning with a RLQ pain that was sharp, stabbing. Patient states pain increased when rolling over, but has since resolved. Patient c/o dizziness that occurred since then, "head is swimming." Patient states dizziness is resolving. Patient states she has a history of vertigo. Patient c/o nausea.

## 2018-01-06 ENCOUNTER — Other Ambulatory Visit: Payer: Self-pay | Admitting: Family Medicine

## 2018-01-15 ENCOUNTER — Other Ambulatory Visit: Payer: Self-pay

## 2018-01-15 DIAGNOSIS — N63 Unspecified lump in unspecified breast: Secondary | ICD-10-CM

## 2018-01-15 NOTE — Progress Notes (Signed)
Patient to be scheduled for 6 month follow-up mammo and ultrasound left breast.  Orders are in.  Copy to [facility].

## 2018-01-20 NOTE — Progress Notes (Signed)
Mailed letter with scheduled appointment information .  Patient [facility] 05/[facility] at 11:00.

## 2018-02-24 ENCOUNTER — Encounter
Admission: EM | Disposition: A | Discharge status: Home / Self Care | Payer: Self-pay | Source: Home / Self Care | Attending: Internal Medicine

## 2018-02-24 ENCOUNTER — Emergency Department: Payer: Medicaid Other

## 2018-02-24 ENCOUNTER — Inpatient Hospital Stay
Admission: EM | Admit: 2018-02-24 | Discharge: 2018-02-26 | DRG: 247 | Disposition: A | Discharge status: Home / Self Care | Payer: Medicaid Other | Attending: Internal Medicine | Admitting: Internal Medicine

## 2018-02-24 ENCOUNTER — Other Ambulatory Visit: Payer: Self-pay

## 2018-02-24 DIAGNOSIS — R079 Chest pain, unspecified: Secondary | ICD-10-CM

## 2018-02-24 DIAGNOSIS — F17210 Nicotine dependence, cigarettes, uncomplicated: Secondary | ICD-10-CM | POA: Diagnosis present

## 2018-02-24 DIAGNOSIS — E119 Type 2 diabetes mellitus without complications: Secondary | ICD-10-CM | POA: Diagnosis present

## 2018-02-24 DIAGNOSIS — I10 Essential (primary) hypertension: Secondary | ICD-10-CM | POA: Diagnosis present

## 2018-02-24 DIAGNOSIS — Z6841 Body Mass Index (BMI) 40.0 and over, adult: Secondary | ICD-10-CM | POA: Diagnosis not present

## 2018-02-24 DIAGNOSIS — Z90710 Acquired absence of both cervix and uterus: Secondary | ICD-10-CM | POA: Diagnosis not present

## 2018-02-24 DIAGNOSIS — I200 Unstable angina: Secondary | ICD-10-CM | POA: Diagnosis present

## 2018-02-24 DIAGNOSIS — I214 Non-ST elevation (NSTEMI) myocardial infarction: Secondary | ICD-10-CM | POA: Diagnosis present

## 2018-02-24 DIAGNOSIS — I25110 Atherosclerotic heart disease of native coronary artery with unstable angina pectoris: Secondary | ICD-10-CM | POA: Diagnosis present

## 2018-02-24 DIAGNOSIS — G4733 Obstructive sleep apnea (adult) (pediatric): Secondary | ICD-10-CM | POA: Diagnosis present

## 2018-02-24 DIAGNOSIS — R001 Bradycardia, unspecified: Secondary | ICD-10-CM | POA: Diagnosis present

## 2018-02-24 DIAGNOSIS — Z79899 Other long term (current) drug therapy: Secondary | ICD-10-CM | POA: Diagnosis not present

## 2018-02-24 DIAGNOSIS — Z716 Tobacco abuse counseling: Secondary | ICD-10-CM | POA: Diagnosis not present

## 2018-02-24 DIAGNOSIS — Z713 Dietary counseling and surveillance: Secondary | ICD-10-CM | POA: Diagnosis not present

## 2018-02-24 HISTORY — PX: LEFT HEART CATH AND CORONARY ANGIOGRAPHY: CATH118249

## 2018-02-24 HISTORY — DX: Type 2 diabetes mellitus without complications: E11.9

## 2018-02-24 HISTORY — PX: CORONARY STENT INTERVENTION: CATH118234

## 2018-02-24 LAB — LIPID PANEL
Cholesterol: 228 mg/dL — ABNORMAL HIGH (ref 0–200)
HDL: 36 mg/dL — ABNORMAL LOW (ref >40)
LDL Cholesterol: 165 mg/dL — ABNORMAL HIGH (ref 0–99)
Total CHOL/HDL Ratio: 6.3 RATIO
Triglycerides: 134 mg/dL (ref <150)
VLDL: 27 mg/dL (ref 0–40)

## 2018-02-24 LAB — CBC
HCT: 43.6 % (ref 35.0–47.0)
Hemoglobin: 14.7 g/dL (ref 12.0–16.0)
MCH: 28.6 pg (ref 26.0–34.0)
MCHC: 33.8 g/dL (ref 32.0–36.0)
MCV: 84.6 fL (ref 80.0–100.0)
Platelets: UNDETERMINED 10*3/uL (ref 150–440)
RBC: 5.15 MIL/uL (ref 3.80–5.20)
RDW: 14.7 % — ABNORMAL HIGH (ref 11.5–14.5)
WBC: 9.8 10*3/uL (ref 3.6–11.0)

## 2018-02-24 LAB — TROPONIN I
Troponin I: <0.03 ng/mL (ref <0.03)
Troponin I: 19.44 ng/mL (ref <0.03)

## 2018-02-24 LAB — BASIC METABOLIC PANEL
Anion gap: 11 (ref 5–15)
BUN: 11 mg/dL (ref 6–20)
CO2: 24 mmol/L (ref 22–32)
Calcium: 9 mg/dL (ref 8.9–10.3)
Chloride: 104 mmol/L (ref 98–111)
Creatinine, Ser: 0.88 mg/dL (ref 0.44–1.00)
GFR calc Af Amer: >60 mL/min (ref >60)
GFR calc non Af Amer: >60 mL/min (ref >60)
Glucose, Bld: 169 mg/dL — ABNORMAL HIGH (ref 70–99)
Potassium: 3.8 mmol/L (ref 3.5–5.1)
Sodium: 139 mmol/L (ref 135–145)

## 2018-02-24 LAB — GLUCOSE, CAPILLARY
Glucose-Capillary: 152 mg/dL — ABNORMAL HIGH (ref 70–99)
Glucose-Capillary: 116 mg/dL — ABNORMAL HIGH (ref 70–99)
Glucose-Capillary: 105 mg/dL — ABNORMAL HIGH (ref 70–99)

## 2018-02-24 LAB — HEMOGLOBIN A1C
Hgb A1c MFr Bld: 6.4 % — ABNORMAL HIGH (ref 4.8–5.6)
Mean Plasma Glucose: 136.98 mg/dL

## 2018-02-24 LAB — POCT ACTIVATED CLOTTING TIME: Activated Clotting Time: 379 seconds

## 2018-02-24 SURGERY — LEFT HEART CATH AND CORONARY ANGIOGRAPHY
Anesthesia: Moderate Sedation | Site: Groin | Laterality: Right

## 2018-02-24 MED ORDER — OXYCODONE HCL 5 MG PO TABS
5.0000 mg | ORAL_TABLET | ORAL | Status: DC | PRN
  Administered 2018-02-25 – 2018-02-26 (×4): 5 mg via ORAL
  Filled 2018-02-24 (×4): qty 1

## 2018-02-24 MED ORDER — FENTANYL CITRATE (PF) 100 MCG/2ML IJ SOLN
INTRAMUSCULAR | Status: DC | PRN
  Administered 2018-02-24 (×2): 25 ug via INTRAVENOUS

## 2018-02-24 MED ORDER — SODIUM CHLORIDE 0.9% FLUSH: 3.0000 mL | INTRAVENOUS | Status: DC | PRN

## 2018-02-24 MED ORDER — IOPAMIDOL (ISOVUE-300) INJECTION 61%
INTRAVENOUS | Status: DC | PRN
  Administered 2018-02-24: 280 mL via INTRA_ARTERIAL

## 2018-02-24 MED ORDER — MORPHINE SULFATE (PF) 2 MG/ML IV SOLN
INTRAVENOUS | Status: AC
  Filled 2018-02-24: qty 1

## 2018-02-24 MED ORDER — LABETALOL HCL 5 MG/ML IV SOLN: 10.0000 mg | INTRAVENOUS | Status: AC | PRN

## 2018-02-24 MED ORDER — BIVALIRUDIN TRIFLUOROACETATE 250 MG IV SOLR
INTRAVENOUS | Status: AC
  Filled 2018-02-24: qty 250

## 2018-02-24 MED ORDER — PRASUGREL HCL 10 MG PO TABS
10.0000 mg | ORAL_TABLET | Freq: Every day | ORAL | Status: DC
  Administered 2018-02-25 – 2018-02-26 (×2): 10 mg via ORAL
  Filled 2018-02-24 (×2): qty 1

## 2018-02-24 MED ORDER — HEPARIN SODIUM (PORCINE) 5000 UNIT/ML IJ SOLN: 4000.0000 [IU] | Freq: Once | INTRAMUSCULAR | Status: DC

## 2018-02-24 MED ORDER — MIDAZOLAM HCL 2 MG/2ML IJ SOLN
INTRAMUSCULAR | Status: DC | PRN
  Administered 2018-02-24 (×2): 1 mg via INTRAVENOUS

## 2018-02-24 MED ORDER — HYDROCHLOROTHIAZIDE 12.5 MG PO CAPS
12.5000 mg | ORAL_CAPSULE | Freq: Every day | ORAL | Status: DC
  Administered 2018-02-25: 12.5 mg via ORAL
  Filled 2018-02-24: qty 1

## 2018-02-24 MED ORDER — ACETAMINOPHEN 325 MG PO TABS: 650.0000 mg | ORAL_TABLET | ORAL | Status: DC | PRN

## 2018-02-24 MED ORDER — TICAGRELOR 90 MG PO TABS
ORAL_TABLET | ORAL | Status: DC | PRN
  Administered 2018-02-24: 180 mg via ORAL

## 2018-02-24 MED ORDER — ONDANSETRON HCL 4 MG/2ML IJ SOLN: 4.0000 mg | Freq: Four times a day (QID) | INTRAMUSCULAR | Status: DC | PRN

## 2018-02-24 MED ORDER — SODIUM CHLORIDE 0.9 % IV SOLN
INTRAVENOUS | Status: DC
  Administered 2018-02-25: 01:00:00 via INTRAVENOUS

## 2018-02-24 MED ORDER — BIVALIRUDIN BOLUS VIA INFUSION - CUPID
INTRAVENOUS | Status: DC | PRN
  Administered 2018-02-24: 102 mg via INTRAVENOUS

## 2018-02-24 MED ORDER — ACETAMINOPHEN 650 MG RE SUPP: 650.0000 mg | Freq: Four times a day (QID) | RECTAL | Status: DC | PRN

## 2018-02-24 MED ORDER — NITROGLYCERIN 1 MG/10 ML FOR IR/CATH LAB
INTRA_ARTERIAL | Status: DC | PRN
  Administered 2018-02-24: 200 ug via INTRACORONARY

## 2018-02-24 MED ORDER — INSULIN ASPART 100 UNIT/ML ~~LOC~~ SOLN: 0.0000 [IU] | Freq: Every day | SUBCUTANEOUS | Status: DC

## 2018-02-24 MED ORDER — ACETAMINOPHEN 325 MG PO TABS: 650.0000 mg | ORAL_TABLET | Freq: Four times a day (QID) | ORAL | Status: DC | PRN

## 2018-02-24 MED ORDER — ASPIRIN 81 MG PO CHEW
81.0000 mg | CHEWABLE_TABLET | Freq: Every day | ORAL | Status: DC
  Administered 2018-02-25 – 2018-02-26 (×2): 81 mg via ORAL
  Filled 2018-02-24 (×2): qty 1

## 2018-02-24 MED ORDER — NITROGLYCERIN 0.4 MG SL SUBL: 0.4000 mg | SUBLINGUAL_TABLET | SUBLINGUAL | Status: DC | PRN

## 2018-02-24 MED ORDER — HYDRALAZINE HCL 20 MG/ML IJ SOLN: 5.0000 mg | INTRAMUSCULAR | Status: AC | PRN

## 2018-02-24 MED ORDER — ONDANSETRON HCL 4 MG PO TABS: 4.0000 mg | ORAL_TABLET | Freq: Four times a day (QID) | ORAL | Status: DC | PRN

## 2018-02-24 MED ORDER — POLYETHYLENE GLYCOL 3350 17 G PO PACK: 17.0000 g | PACK | Freq: Every day | ORAL | Status: DC | PRN

## 2018-02-24 MED ORDER — LISINOPRIL-HYDROCHLOROTHIAZIDE 10-12.5 MG PO TABS: 1.0000 | ORAL_TABLET | Freq: Every day | ORAL | Status: DC

## 2018-02-24 MED ORDER — SODIUM CHLORIDE 0.9% FLUSH
3.0000 mL | Freq: Two times a day (BID) | INTRAVENOUS | Status: DC
  Administered 2018-02-25 (×2): 3 mL via INTRAVENOUS

## 2018-02-24 MED ORDER — LISINOPRIL 10 MG PO TABS
10.0000 mg | ORAL_TABLET | Freq: Every day | ORAL | Status: DC
  Administered 2018-02-25: 10 mg via ORAL
  Filled 2018-02-24: qty 1

## 2018-02-24 MED ORDER — SODIUM CHLORIDE 0.9% FLUSH: 3.0000 mL | Freq: Two times a day (BID) | INTRAVENOUS | Status: DC

## 2018-02-24 MED ORDER — HEPARIN SODIUM (PORCINE) 1000 UNIT/ML IJ SOLN
4000.0000 [IU] | Freq: Once | INTRAMUSCULAR | Status: AC
  Administered 2018-02-24: 4000 [IU] via INTRAVENOUS
  Filled 2018-02-24: qty 4

## 2018-02-24 MED ORDER — MORPHINE SULFATE (PF) 4 MG/ML IV SOLN
2.0000 mg | INTRAVENOUS | Status: DC | PRN
  Administered 2018-02-24 (×2): 1 mg via INTRAVENOUS
  Administered 2018-02-25: 2 mg via INTRAVENOUS
  Filled 2018-02-24: qty 1

## 2018-02-24 MED ORDER — ASPIRIN 81 MG PO CHEW
CHEWABLE_TABLET | ORAL | Status: AC
  Filled 2018-02-24: qty 4

## 2018-02-24 MED ORDER — ATROPINE SULFATE 1 MG/10ML IJ SOSY
PREFILLED_SYRINGE | INTRAMUSCULAR | Status: AC
  Filled 2018-02-24: qty 10

## 2018-02-24 MED ORDER — HEPARIN SODIUM (PORCINE) 5000 UNIT/ML IJ SOLN
5000.0000 [IU] | Freq: Three times a day (TID) | INTRAMUSCULAR | Status: DC
  Administered 2018-02-24 – 2018-02-26 (×4): 5000 [IU] via SUBCUTANEOUS
  Filled 2018-02-24 (×4): qty 1

## 2018-02-24 MED ORDER — INSULIN ASPART 100 UNIT/ML ~~LOC~~ SOLN: 0.0000 [IU] | Freq: Three times a day (TID) | SUBCUTANEOUS | Status: DC

## 2018-02-24 MED ORDER — SODIUM CHLORIDE 0.9 % IV SOLN: 250.0000 mL | INTRAVENOUS | Status: DC | PRN

## 2018-02-24 MED ORDER — HEPARIN (PORCINE) IN NACL 1000-0.9 UT/500ML-% IV SOLN
INTRAVENOUS | Status: AC
  Filled 2018-02-24: qty 1000

## 2018-02-24 MED ORDER — FENTANYL CITRATE (PF) 100 MCG/2ML IJ SOLN
INTRAMUSCULAR | Status: AC
  Filled 2018-02-24: qty 2

## 2018-02-24 MED ORDER — SODIUM CHLORIDE 0.9 % WEIGHT BASED INFUSION
1.0000 mL/kg/h | INTRAVENOUS | Status: AC
  Administered 2018-02-24 (×2): 1 mL/kg/h via INTRAVENOUS

## 2018-02-24 MED ORDER — SODIUM CHLORIDE 0.9 % IV SOLN
INTRAVENOUS | Status: AC | PRN
  Administered 2018-02-24 (×2): 1.75 mg/kg/h via INTRAVENOUS

## 2018-02-24 MED ORDER — TICAGRELOR 90 MG PO TABS
ORAL_TABLET | ORAL | Status: AC
  Filled 2018-02-24: qty 2

## 2018-02-24 MED ORDER — NITROGLYCERIN 5 MG/ML IV SOLN
INTRAVENOUS | Status: AC
  Filled 2018-02-24: qty 10

## 2018-02-24 MED ORDER — SODIUM CHLORIDE 0.9 % IV SOLN
0.2500 mg/kg/h | INTRAVENOUS | Status: AC
  Filled 2018-02-24: qty 250

## 2018-02-24 MED ORDER — SODIUM CHLORIDE 0.9 % WEIGHT BASED INFUSION: 1.0000 mL/kg/h | INTRAVENOUS | Status: DC

## 2018-02-24 MED ORDER — ASPIRIN 81 MG PO CHEW: 81.0000 mg | CHEWABLE_TABLET | ORAL | Status: DC

## 2018-02-24 MED ORDER — SODIUM CHLORIDE 0.9 % WEIGHT BASED INFUSION: 3.0000 mL/kg/h | INTRAVENOUS | Status: AC

## 2018-02-24 MED ORDER — ONDANSETRON HCL 4 MG/2ML IJ SOLN
INTRAMUSCULAR | Status: AC
  Filled 2018-02-24: qty 2

## 2018-02-24 MED ORDER — ATORVASTATIN CALCIUM 20 MG PO TABS
40.0000 mg | ORAL_TABLET | Freq: Every day | ORAL | Status: DC
  Administered 2018-02-25: 40 mg via ORAL
  Filled 2018-02-24: qty 2

## 2018-02-24 MED ORDER — ASPIRIN 81 MG PO CHEW: 81.0000 mg | CHEWABLE_TABLET | Freq: Every day | ORAL | Status: DC

## 2018-02-24 MED ORDER — ATROPINE SULFATE 1 MG/10ML IJ SOSY
PREFILLED_SYRINGE | INTRAMUSCULAR | Status: DC | PRN
  Administered 2018-02-24: 1 mg via INTRAVENOUS

## 2018-02-24 MED ORDER — ONDANSETRON HCL 4 MG/2ML IJ SOLN
4.0000 mg | Freq: Once | INTRAMUSCULAR | Status: AC
  Administered 2018-02-24: 4 mg via INTRAVENOUS

## 2018-02-24 MED ORDER — MIDAZOLAM HCL 2 MG/2ML IJ SOLN
INTRAMUSCULAR | Status: AC
  Filled 2018-02-24: qty 2

## 2018-02-24 SURGICAL SUPPLY — 17 items
BALLN TREK RX 2.5X15 (BALLOONS) ×4
BALLOON TREK RX 2.5X15 (BALLOONS) ×2 IMPLANT
CATH INFINITI 5FR ANG PIGTAIL (CATHETERS) ×4 IMPLANT
CATH INFINITI 5FR JL4 (CATHETERS) ×4 IMPLANT
CATH INFINITI JR4 5F (CATHETERS) ×4 IMPLANT
CATH VISTA GUIDE 6FR JR4 (CATHETERS) ×4 IMPLANT
CATH VISTA GUIDE 6FR JR4 SH (CATHETERS) ×4 IMPLANT
DEVICE CLOSURE MYNXGRIP 6/7F (Vascular Products) ×4 IMPLANT
DEVICE INFLAT 30 PLUS (MISCELLANEOUS) ×4 IMPLANT
KIT MANI 3VAL PERCEP (MISCELLANEOUS) ×4 IMPLANT
NEEDLE PERC 18GX7CM (NEEDLE) ×4 IMPLANT
PACK CARDIAC CATH (CUSTOM PROCEDURE TRAY) ×4 IMPLANT
SHEATH AVANTI 6FR X 11CM (SHEATH) ×4 IMPLANT
STENT SIERRA 2.50 X 12 MM (Permanent Stent) ×4 IMPLANT
STENT SIERRA 2.75 X 18 MM (Permanent Stent) ×4 IMPLANT
WIRE ASAHI PROWATER 180CM (WIRE) ×4 IMPLANT
WIRE GUIDERIGHT .035X150 (WIRE) ×4 IMPLANT

## 2018-02-24 NOTE — ED Notes (Addendum)
Pt state she was getting out of shower when "the weakness came over me." went to lay down when CP, SOB, N began. Pt was dry heaving during assessment prior to zofran administration. +smoker, +HTN, prediabetic.   Pt states HR is usually 60.   Alert and oriented.

## 2018-02-24 NOTE — Consult Note (Signed)
[facility] Card[facility]  CARDIOLOGY CONSULT NOTE  Patient ID: JOSI ROEDIGER MRN: [id number] DOB/AGE: 07/26/1960 57 y.o.  Admit date: 02/24/2018 Referring Physician Mody Primary Physician [facility] Primary Cardiologist  Reason for Consultation unstable angina  HPI: 57 year old female referred for evaluation of new onset chest pain.  Patient has a history of essential hypertension.  She was a you state of health until 11 AM this morning when she developed sternal chest pain.  She describes substernal left-sided chest pain, pressure-like in sensation, with pain radiating down her left arm.  She was brought to [facility] ED where initial ECG revealed sinus bradycardia, less than 1 mm ST elevation in leads III and aVF, ST-T abnormalities laterally.  Review of systems complete and found to be negative unless listed above     Past Medical History:  Diagnosis Date  . Diabetes mellitus without complication (HCC)   . Hypertension   . Sleep apnea   . Vertigo     Past Surgical History:  Procedure Laterality Date  . ABDOMINAL HYSTERECTOMY    . SHOULDER ARTHROSCOPY WITH OPEN ROTATOR CUFF REPAIR Left 08/14/2017   Procedure: SHOULDER ARTHROSCOPY WITH ROTATOR CUFF REPAIR, BICEPS TENOTOMY, SUBACROMIAL DECOMPRESSION;  Surgeon: Lyndle Herrlich, MD;  Location: [facility] ORS;  Service: Orthopedics;  Laterality: Left;  . UMBILICAL HERNIA REPAIR       (Not in a hospital admission) Social History   Socioeconomic History  . Marital status: Married    Spouse name: Not on file  . Number of children: Not on file  . Years of education: Not on file  . Highest education level: Not on file  Occupational History  . Not on file  Social Needs  . Financial resource strain: Not on file  . Food insecurity:    Worry: Not on file    Inability: Not on file  . Transportation needs:    Medical: Not on file    Non-medical: Not on file  Tobacco Use  . Smoking status: Current Every Day Smoker    Types: Cigarettes  .  Smokeless tobacco: Never Used  Substance and Sexual Activity  . Alcohol use: No  . Drug use: No  . Sexual activity: Yes    Birth control/protection: Condom  Lifestyle  . Physical activity:    Days per week: Not on file    Minutes per session: Not on file  . Stress: Not on file  Relationships  . Social connections:    Talks on phone: Not on file    Gets together: Not on file    Attends religious service: Not on file    Active member of club or organization: Not on file    Attends meetings of clubs or organizations: Not on file    Relationship status: Not on file  . Intimate partner violence:    Fear of current or ex partner: Not on file    Emotionally abused: Not on file    Physically abused: Not on file    Forced sexual activity: Not on file  Other Topics Concern  . Not on file  Social History Narrative  . Not on file    History reviewed. No pertinent family history.    Review of systems complete and found to be negative unless listed above      PHYSICAL EXAM  General: Well developed, well nourished, in no acute distress HEENT:  Normocephalic and atramatic Neck:  No JVD.  Lungs: Clear bilaterally to auscultation and percussion. Heart: HRRR . Normal S1  and S2 without gallops or murmurs.  Abdomen: Bowel sounds are positive, abdomen soft and non-tender  Msk:  Back normal, normal gait. Normal strength and tone for age. Extremities: No clubbing, cyanosis or edema.   Neuro: Alert and oriented X 3. Psych:  Good affect, responds appropriately  Labs:   Lab Results  Component Value Date   WBC 9.8 02/24/2018   HGB 14.7 02/24/2018   HCT 43.6 02/24/2018   MCV 84.6 02/24/2018   PLT PENDING 02/24/2018   Recent Labs  Lab 02/24/18 1307  NA 139  K 3.8  CL 104  CO2 24  BUN 11  CREATININE 0.88  CALCIUM 9.0  GLUCOSE 169*   Lab Results  Component Value Date   TROPONINI <0.03 02/24/2018   No results found for: CHOL No results found for: HDL No results found for:  LDLCALC No results found for: TRIG No results found for: CHOLHDL No results found for: LDLDIRECT    Radiology: Dg Chest Portable 1 View  Result Date: 02/24/2018 CLINICAL DATA:  Chest pain since this morning EXAM: PORTABLE CHEST 1 VIEW COMPARISON:  Portable exam at 1315 hours compared to 10/20/2016 FINDINGS: Normal heart size, mediastinal contours, and pulmonary vascularity. Lungs clear. No pleural effusion or pneumothorax. Bones unremarkable. IMPRESSION: No acute abnormalities. Electronically Signed   By: Ulyses SouthwardMark  Boles M.D.   On: 02/24/2018 13:36    EKG: Normal sinus rhythm, less than 1 mm ST elevation in leads III and aVF, ST-T abnormalities laterally  ASSESSMENT AND PLAN:   1.  New onset chest pain, abnormal ECG, nondiagnostic ST elevation inferiorly, and lateral ST-T wave abnormalities, certainly consistent with unstable angina  Recommendations  1.  Proceed with urgent cardiac catheterization with selective coronary arteriography, and possible percutaneous coronary mention.  The risks, benefits and alternatives of cardiac catheterization and PCI were explained to the patient and informed consent was obtained.  Signed: Marcina MillardAlexander Adler Chartrand MD,PhD, [facility] Pa[facility]ACC 02/24/2018, 1:51 PM

## 2018-02-24 NOTE — ED Provider Notes (Signed)
[facility] Emergency Department Provider Note  Time seen: 1:09 PM  I have reviewed the triage vital signs and the nursing notes.   HISTORY  Chief Complaint Chest Pain    HPI Karen Potts is a 57 y.o. female with a past medical history of hypertension, morbid obesity, presents to the emergency department for chest pain.  According to the patient around 1130 this morning she had acute onset of chest pain states it was 10 out of 10 in severity associated with nausea diaphoresis and shortness of breath.  Patient vomited several times.  Patient states the pain failed to resolve so eventually she called EMS EMS brought the patient to the emergency department.  Upon arrival status post full-strength aspirin nitroglycerin and fentanyl patient rates her pain as an 8/10 currently appears uncomfortable and vomited once again in the emergency department.  Patient also states pain in her left arm described as a dull aching pain.     Past Medical History:  Diagnosis Date  . Hypertension   . Sleep apnea   . Vertigo     There are no active problems to display for this patient.   Past Surgical History:  Procedure Laterality Date  . ABDOMINAL HYSTERECTOMY    . SHOULDER ARTHROSCOPY WITH OPEN ROTATOR CUFF REPAIR Left 08/14/2017   Procedure: SHOULDER ARTHROSCOPY WITH ROTATOR CUFF REPAIR, BICEPS TENOTOMY, SUBACROMIAL DECOMPRESSION;  Surgeon: Lyndle HerrlichBowers, James R, MD;  Location: [facility] ORS;  Service: Orthopedics;  Laterality: Left;  . UMBILICAL HERNIA REPAIR      Prior to Admission medications   Medication Sig Start Date End Date Taking? Authorizing Provider  docusate sodium (COLACE) 100 MG capsule Take 1 capsule (100 mg total) by mouth daily as needed. Patient not taking: Reported on 12/13/2017 08/14/17 08/14/18  Lyndle HerrlichBowers, James R, MD  HYDROcodone-acetaminophen (NORCO/VICODIN) 5-325 MG tablet Take 1 tablet by mouth every 4 (four) hours as needed for moderate pain. Patient not  taking: Reported on 12/13/2017 08/14/17 08/14/18  Lyndle HerrlichBowers, James R, MD  lisinopril-hydrochlorothiazide (PRINZIDE,ZESTORETIC) 10-12.5 MG tablet TAKE 1 TABLET BY MOUTH ONCE DAILY 05/01/17   Chrismon, Jodell Ciproennis E, PA  meclizine (ANTIVERT) 12.5 MG tablet Take 1 tablet (12.5 mg total) by mouth 3 (three) times daily as needed for dizziness or nausea. 12/13/17   Sharyn CreamerQuale, Mark, MD  meloxicam (MOBIC) 15 MG tablet Take 15 mg by mouth daily.    [provider]    No Known Allergies  No family history on file.  Social History Social History   Tobacco Use  . Smoking status: Current Every Day Smoker    Types: Cigarettes  . Smokeless tobacco: Never Used  Substance Use Topics  . Alcohol use: No  . Drug use: No    Review of Systems Constitutional: Negative for fever. Eyes: Negative for visual complaints ENT: Negative for recent illness/congestion Cardiovascular: Positive for 10/10 chest pain. Respiratory: Positive for shortness of breath Gastrointestinal: Negative for abdominal pain.  Positive for nausea and vomiting.  Negative for diarrhea Genitourinary: Negative for urinary compaints Musculoskeletal: Negative for leg pain. Skin: Positive for diaphoresis, now resolved. Neurological: Negative for headache All other ROS negative  ____________________________________________   PHYSICAL EXAM:  VITAL SIGNS: ED Triage Vitals  Enc Vitals Group     BP --      Pulse Rate 02/24/18 1308 (!) 52     Resp 02/24/18 1308 (!) 22     Temp 02/24/18 1308 98.5 F (36.9 C)     Temp Source 02/24/18 1308 Oral  SpO2 02/24/18 1308 100 %     Weight 02/24/18 1306 300 lb (136.1 kg)     Height 02/24/18 1306 5\' 7"  (1.702 m)     Head Circumference --      Peak Flow --      Pain Score 02/24/18 1306 7     Pain Loc --      Pain Edu? --      Excl. in GC? --    Constitutional: Alert and oriented. Well appearing and in no distress. Eyes: Normal exam ENT   Head: Normocephalic and atraumatic.    Mouth/Throat: Mucous membranes are moist. Cardiovascular: Normal rate, regular rhythm. No murmur Respiratory: Normal respiratory effort without tachypnea nor retractions. Breath sounds are clear.  Mild chest wall tenderness to palpation. Gastrointestinal: Soft and nontender. No distention.   Musculoskeletal: Nontender with normal range of motion in all extremities. Neurologic:  Normal speech and language. No gross focal neurologic deficits  Skin:  Skin is warm, dry and intact.  Psychiatric: Mood and affect are normal.  ____________________________________________    EKG  EKG reviewed and interpreted by myself shows sinus rhythm at 51 bpm narrow QRS, normal axis, normal intervals.  Patient does have slight ST elevation in lead III possibly aVF, she has T wave inversion mild ST depression in leads I and aVL and somewhat in lead V6.  I reviewed the patient's old EKG from 2 months ago which appeared largely normal, definitely new changes from prior EKG.  ____________________________________________    RADIOLOGY  X-ray negative  ____________________________________________   INITIAL IMPRESSION / ASSESSMENT AND PLAN / ED COURSE  Pertinent labs & imaging results that were available during my care of the patient were reviewed by me and considered in my medical decision making (see chart for details).  Patient presents to the emergency department with acute onset of chest pain nausea shortness of breath diaphoresis with pain radiating to the left arm.  Patient has a history of hypertension and is morbidly obese estimates weighed around 300 pounds.  Patient has concerning EKG findings although it does not definitively meet STEMI criteria I highly suspect ACS to be the cause of the patient's discomfort.  We will discuss with on-call cardiology for STEMI to discuss further management.  I will bolus 4000 units of IV heparin while awaiting cardiology response.  Dr. Darrold Junker has seen the patient,  will be taking up to the Cath Lab to evaluate given her suspicious EKG and concerning story although it does not currently meet STEMI criteria.  I discussed with the hospitalist they will be admitting to their service.  Labs are normal including a negative troponin however again given her concerning story and EKG she will be taken to the catheterization lab for evaluation.   CRITICAL CARE Performed by: Minna Antis   Total critical care time: 30 minutes  Critical care time was exclusive of separately billable procedures and treating other patients.  Critical care was necessary to treat or prevent imminent or life-threatening deterioration.  Critical care was time spent personally by me on the following activities: development of treatment plan with patient and/or surrogate as well as nursing, discussions with consultants, evaluation of patient's response to treatment, examination of patient, obtaining history from patient or surrogate, ordering and performing treatments and interventions, ordering and review of laboratory studies, ordering and review of radiographic studies, pulse oximetry and re-evaluation of patient's condition.   ____________________________________________   FINAL CLINICAL IMPRESSION(S) / ED DIAGNOSES  chest pain    Monya Kozakiewicz,  Caryn Bee, MD 02/24/18 1347

## 2018-02-24 NOTE — ED Notes (Signed)
Heparin push verified with Trula Orehristina RN

## 2018-02-24 NOTE — H&P (Addendum)
[facility] [place] [facility]   PATIENT NAME: Karen Potts    MR#:  [id number]  DATE OF BIRTH:  1960/12/23  DATE OF ADMISSION:  02/24/2018  PRIMARY CARE PHYSICIAN: [facility]   REQUESTING/REFERRING PHYSICIAN: dr Lenard Lance  CHIEF COMPLAINT:   Chest pain HISTORY OF PRESENT ILLNESS:  Karen Potts  is a 57 y.o. female with a known history of obesity, diabetes, essential hypertension, sleep apnea not using CPAP who presents today to the emergency room due to chest pain.  Patient reports after she took a shower at approximate 11:00 AM this morning she developed sudden onset 10 out of 10 substernal chest pain radiating to the left arm.  It was associate with shortness of breath, lightheadedness and nausea. Her family called EMS and she presents to the ER with the symptoms.  In the EMS truck she received baby aspirin and when she came to the ER she received nitroglycerin and morphine.  Her EKG shows some minimal ST elevations in aVF and lead III as well as ST depressions in 1 and aVL.  She was evaluated by Dr. Evette Georges who is recommending patient be emergently sent to cardiac catheterization lab due to ongoing chest pain and concerning EKG changes.  He is currently having 5 out of 10 chest pain.  No exacerbating factors are reported.  She does report that the nitro and morphine did relieve some of the pain.  PAST MEDICAL HISTORY:   Past Medical History:  Diagnosis Date  . Diabetes mellitus without complication (HCC)   . Hypertension   . Sleep apnea   . Vertigo     PAST SURGICAL HISTORY:   Past Surgical History:  Procedure Laterality Date  . ABDOMINAL HYSTERECTOMY    . SHOULDER ARTHROSCOPY WITH OPEN ROTATOR CUFF REPAIR Left 08/14/2017   Procedure: SHOULDER ARTHROSCOPY WITH ROTATOR CUFF REPAIR, BICEPS TENOTOMY, SUBACROMIAL DECOMPRESSION;  Surgeon: Lyndle Herrlich, MD;  Location: [facility] ORS;  Service: Orthopedics;  Laterality: Left;  .  UMBILICAL HERNIA REPAIR      SOCIAL HISTORY:   Social History   Tobacco Use  . Smoking status: Current Every Day Smoker    Types: Cigarettes  . Smokeless tobacco: Never Used  Substance Use Topics  . Alcohol use: No    FAMILY HISTORY:  History reviewed. No pertinent family history.  DRUG ALLERGIES:  No Known Allergies  REVIEW OF SYSTEMS:   Review of Systems  Constitutional: Negative.  Negative for chills, fever and malaise/fatigue.  HENT: Negative.  Negative for ear discharge, ear pain, hearing loss, nosebleeds and sore throat.   Eyes: Negative.  Negative for blurred vision and pain.  Respiratory: Positive for shortness of breath. Negative for cough, hemoptysis and wheezing.   Cardiovascular: Positive for chest pain. Negative for palpitations and leg swelling.  Gastrointestinal: Negative.  Negative for abdominal pain, blood in stool, diarrhea, nausea and vomiting.  Genitourinary: Negative.  Negative for dysuria.  Musculoskeletal: Negative.  Negative for back pain.  Skin: Negative.   Neurological: Negative for dizziness, tremors, speech change, focal weakness, seizures and headaches.  Endo/Heme/Allergies: Negative.  Does not bruise/bleed easily.  Psychiatric/Behavioral: Negative.  Negative for depression, hallucinations and suicidal ideas.    MEDICATIONS AT HOME:   Prior to Admission medications   Medication Sig Start Date End Date Taking? Authorizing Provider  lisinopril-hydrochlorothiazide (PRINZIDE,ZESTORETIC) 10-12.5 MG tablet TAKE 1 TABLET BY MOUTH ONCE DAILY 05/01/17  Yes Chrismon, Jodell Cipro, PA  meclizine (ANTIVERT) 12.5 MG tablet Take 1 tablet (12.5  mg total) by mouth 3 (three) times daily as needed for dizziness or nausea. 12/13/17  Yes Sharyn CreamerQuale, Mark, MD  docusate sodium (COLACE) 100 MG capsule Take 1 capsule (100 mg total) by mouth daily as needed. Patient not taking: Reported on 12/13/2017 08/14/17 08/14/18  Lyndle HerrlichBowers, James R, MD  HYDROcodone-acetaminophen (NORCO/VICODIN)  5-325 MG tablet Take 1 tablet by mouth every 4 (four) hours as needed for moderate pain. Patient not taking: Reported on 12/13/2017 08/14/17 08/14/18  Lyndle HerrlichBowers, James R, MD      VITAL SIGNS:  Blood pressure (!) 153/62, pulse (!) 50, temperature 98.5 F (36.9 C), temperature source Oral, resp. rate 17, height 5\' 7"  (1.702 m), weight 136.1 kg, SpO2 100 %.  PHYSICAL EXAMINATION:   Physical Exam  Constitutional: She is oriented to person, place, and time. No distress.  Obese in moderate distress due to chest pain  HENT:  Head: Normocephalic.  Eyes: No scleral icterus.  Neck: Normal range of motion. Neck supple. No JVD present. No tracheal deviation present.  Cardiovascular: Normal rate, regular rhythm and normal heart sounds. Exam reveals no gallop and no friction rub.  No murmur heard. Pulmonary/Chest: Effort normal and breath sounds normal. No respiratory distress. She has no wheezes. She has no rales. She exhibits no tenderness.  Abdominal: Soft. Bowel sounds are normal. She exhibits no distension and no mass. There is no tenderness. There is no rebound and no guarding.  Musculoskeletal: Normal range of motion. She exhibits no edema.       Right lower leg: Normal.       Left lower leg: Normal.  Neurological: She is alert and oriented to person, place, and time.  Skin: Skin is warm. No rash noted. No erythema.  Psychiatric: Judgment normal.      LABORATORY PANEL:   CBC Recent Labs  Lab 02/24/18 1307  WBC 9.8  HGB 14.7  HCT 43.6  PLT PENDING   ------------------------------------------------------------------------------------------------------------------  Chemistries  Recent Labs  Lab 02/24/18 1307  NA 139  K 3.8  CL 104  CO2 24  GLUCOSE 169*  BUN 11  CREATININE 0.88  CALCIUM 9.0   ------------------------------------------------------------------------------------------------------------------  Cardiac Enzymes Recent Labs  Lab 02/24/18 1307  TROPONINI <0.03    ------------------------------------------------------------------------------------------------------------------  RADIOLOGY:  Dg Chest Portable 1 View  Result Date: 02/24/2018 CLINICAL DATA:  Chest pain since this morning EXAM: PORTABLE CHEST 1 VIEW COMPARISON:  Portable exam at 1315 hours compared to 10/20/2016 FINDINGS: Normal heart size, mediastinal contours, and pulmonary vascularity. Lungs clear. No pleural effusion or pneumothorax. Bones unremarkable. IMPRESSION: No acute abnormalities. Electronically Signed   By: Ulyses SouthwardMark  Boles M.D.   On: 02/24/2018 13:36    EKG:  Slight ST elevation in lead III and aVF with ST depression in 1 and aVL  IMPRESSION AND PLAN:   57 year old female with a history of diabetes, essential hypertension, OSA not using CPAP who presents to the ER due to chest pain.  1.  Unstable angina without complete ST elevation MI: Patient will go emergently to cardiac catheterization for further evaluation due to EKG changes as well as ongoing chest pain. Follow telemetry and troponins Start aspirin Check lipid panel and A1c Check echocardiogram [facility]  2.  Essential hypertension: Continue lisinopril/HCTZ Consider adding beta-blocker pending cardiac catheterization   3.  Diabetes: Continue sliding scale with ADA diet Check A1c  4.  Obesity: Patient is encouraged to lose weight as tolerated  5.  OSA: Patient reports that she has not used  her CPAP in several years.  6. Tobacco dependence: Patient is encouraged to quit smoking. Counseling was provided for 4 minutes.   All the records are reviewed and case discussed with ED provider. Management plans discussed with the patient and she is in agreement  CODE STATUS: FULL  TOTAL TIME TAKING CARE OF THIS PATIENT: 52 minutes.    Emersynn Deatley M.D on 02/24/2018 at 1:55 PM  Between 7am to 6pm - Pager - [phone]  After 6pm go to [email or web address] - Social research officer, government  [facility]  [place] [facility]  Office  [phone]  CC: Primary care physician; [facility]

## 2018-02-24 NOTE — ED Triage Notes (Signed)
Pt arrives to ED via [facility] from home for dizziness after shower and CP that began this AM. Arrives in hospital gown. 1 nitro given by EMS. 50 mcg fentanyl, 324 ASA given in route. VSS with EMS. Describes CP as pressure.

## 2018-02-24 NOTE — ED Notes (Signed)
Admitting MD at bedside.

## 2018-02-24 NOTE — ED Notes (Signed)
Pt signed consent form for cath lab.  Zoll pads placed on pt at this time.

## 2018-02-24 NOTE — Progress Notes (Signed)
Pt complaining of chronic lower back pain 8/10, worsened by lying flat. Morphine in signed and held orders per Hospitalist. Dr. Darrold JunkerParaschos paged and updated on Pt pain, ok per MD to use Morphine post cardiac cath if needed. Pt transferred to hospital bed and states her back is more comfortable now 5/10, will continue to reassess for need of pain meds.

## 2018-02-24 NOTE — Progress Notes (Signed)
Family Meeting Note  Advance Directive:no  Today a meeting took place with the Patient. The following clinical team members were present during this meeting:MD  The following were discussed:Patient's diagnosis: , Patient's progosis: > 12 months and Goals for treatment: Full Code  Additional follow-up to be provided: Chaplain consultation to start advanced directives  Time spent during discussion: 16 minutes  Caelie R[place], MD

## 2018-02-24 NOTE — Progress Notes (Signed)
Pt taken to Cath Lab for procedure emergently from [facility], Bag of fluids hung at 75 ml/hr, unable to start bolus rate.

## 2018-02-24 NOTE — ED Notes (Signed)
Cardiologist at bedside.  

## 2018-02-25 ENCOUNTER — Encounter: Payer: Self-pay | Admitting: Cardiology

## 2018-02-25 ENCOUNTER — Other Ambulatory Visit: Payer: Self-pay

## 2018-02-25 LAB — BASIC METABOLIC PANEL
Anion gap: 5 (ref 5–15)
Anion gap: 7 (ref 5–15)
BUN: 10 mg/dL (ref 6–20)
BUN: 12 mg/dL (ref 6–20)
CO2: 24 mmol/L (ref 22–32)
CO2: 25 mmol/L (ref 22–32)
Calcium: 8.3 mg/dL — ABNORMAL LOW (ref 8.9–10.3)
Calcium: 8.3 mg/dL — ABNORMAL LOW (ref 8.9–10.3)
Chloride: 110 mmol/L (ref 98–111)
Chloride: 109 mmol/L (ref 98–111)
Creatinine, Ser: 0.68 mg/dL (ref 0.44–1.00)
Creatinine, Ser: 0.71 mg/dL (ref 0.44–1.00)
GFR calc Af Amer: >60 mL/min (ref >60)
GFR calc Af Amer: >60 mL/min (ref >60)
GFR calc non Af Amer: >60 mL/min (ref >60)
GFR calc non Af Amer: >60 mL/min (ref >60)
Glucose, Bld: 113 mg/dL — ABNORMAL HIGH (ref 70–99)
Glucose, Bld: 121 mg/dL — ABNORMAL HIGH (ref 70–99)
Potassium: 3.4 mmol/L — ABNORMAL LOW (ref 3.5–5.1)
Potassium: 3.7 mmol/L (ref 3.5–5.1)
Sodium: 139 mmol/L (ref 135–145)
Sodium: 141 mmol/L (ref 135–145)

## 2018-02-25 LAB — CBC
HCT: 39.6 % (ref 35.0–47.0)
HCT: 37.7 % (ref 35.0–47.0)
Hemoglobin: 13.3 g/dL (ref 12.0–16.0)
Hemoglobin: 12.4 g/dL (ref 12.0–16.0)
MCH: 28.3 pg (ref 26.0–34.0)
MCH: 28.2 pg (ref 26.0–34.0)
MCHC: 33.4 g/dL (ref 32.0–36.0)
MCHC: 33 g/dL (ref 32.0–36.0)
MCV: 84.8 fL (ref 80.0–100.0)
MCV: 85.3 fL (ref 80.0–100.0)
Platelets: UNDETERMINED 10*3/uL (ref 150–440)
Platelets: UNDETERMINED 10*3/uL (ref 150–440)
RBC: 4.68 MIL/uL (ref 3.80–5.20)
RBC: 4.42 MIL/uL (ref 3.80–5.20)
RDW: 14.5 % (ref 11.5–14.5)
RDW: 14.9 % — ABNORMAL HIGH (ref 11.5–14.5)
WBC: 10.9 10*3/uL (ref 3.6–11.0)
WBC: 11.1 10*3/uL — ABNORMAL HIGH (ref 3.6–11.0)

## 2018-02-25 LAB — GLUCOSE, CAPILLARY
Glucose-Capillary: 118 mg/dL — ABNORMAL HIGH (ref 70–99)
Glucose-Capillary: 102 mg/dL — ABNORMAL HIGH (ref 70–99)
Glucose-Capillary: 119 mg/dL — ABNORMAL HIGH (ref 70–99)
Glucose-Capillary: 136 mg/dL — ABNORMAL HIGH (ref 70–99)

## 2018-02-25 LAB — TROPONIN I
Troponin I: 40.73 ng/mL (ref <0.03)
Troponin I: 29.39 ng/mL (ref <0.03)

## 2018-02-25 MED ORDER — LISINOPRIL 10 MG PO TABS
10.0000 mg | ORAL_TABLET | Freq: Every day | ORAL | Status: DC
  Administered 2018-02-26: 10 mg via ORAL
  Filled 2018-02-25 (×2): qty 1

## 2018-02-25 MED ORDER — POTASSIUM CHLORIDE CRYS ER 20 MEQ PO TBCR
40.0000 meq | EXTENDED_RELEASE_TABLET | Freq: Once | ORAL | Status: AC
  Administered 2018-02-25: 40 meq via ORAL
  Filled 2018-02-25: qty 2

## 2018-02-25 MED ORDER — FUROSEMIDE 20 MG PO TABS
20.0000 mg | ORAL_TABLET | Freq: Every day | ORAL | Status: DC
  Administered 2018-02-25 – 2018-02-26 (×2): 20 mg via ORAL
  Filled 2018-02-25 (×2): qty 1

## 2018-02-25 NOTE — Progress Notes (Signed)
[facility] Cardiology  SUBJECTIVE:  Patient laying in bed, denies chest pain.   Vitals:   02/24/18 1928 02/25/18 0231 02/25/18 0438 02/25/18 0755  BP: (!) 136/55 (!) 153/53 (!) 137/50 119/61  Pulse: (!) 57 (!) 43 (!) 45 (!) 48  Resp: 16  17 16   Temp: 98.5 F (36.9 C)  98.6 F (37 C) 97.9 F (36.6 C)  TempSrc: Oral  Oral Oral  SpO2: 98%  97% 99%  Weight: 134.7 kg     Height: 5\' 7"  (1.702 m)        Intake/Output Summary (Last 24 hours) at 02/25/2018 1146 Last data filed at 02/25/2018 1018 Gross per 24 hour  Intake 1463.34 ml  Output 500 ml  Net 963.34 ml      PHYSICAL EXAM  General: Well developed, well nourished, in no acute distress HEENT:  Normocephalic and atramatic Neck:  No JVD.  Lungs: Clear bilaterally to auscultation and percussion. Heart: HRRR . Normal S1 and S2 without gallops or murmurs.  Abdomen: Bowel sounds are positive, abdomen soft and non-tender  Msk:  Back normal, normal gait. Normal strength and tone for age. Extremities: No clubbing, cyanosis or edema.   Neuro: Alert and oriented X 3. Psych:  Good affect, responds appropriately   LABS: Basic Metabolic Panel: Recent Labs    02/25/18 0134 02/25/18 0743  NA 139 141  K 3.4* 3.7  CL 110 109  CO2 24 25  GLUCOSE 113* 121*  BUN 10 12  CREATININE 0.68 0.71  CALCIUM 8.3* 8.3*   Liver Function Tests: No results for input(s): AST, ALT, ALKPHOS, BILITOT, PROT, ALBUMIN in the last 72 hours. No results for input(s): LIPASE, AMYLASE in the last 72 hours. CBC: Recent Labs    02/25/18 0134 02/25/18 0743  WBC 10.9 11.1*  HGB 13.3 12.4  HCT 39.6 37.7  MCV 84.8 85.3  PLT PLATELET CLUMPS NOTED ON SMEAR, UNABLE TO ESTIMATE PLATELET CLUMPS NOTED ON SMEAR, UNABLE TO ESTIMATE   Cardiac Enzymes: Recent Labs    02/24/18 1939 02/25/18 0134 02/25/18 0743  TROPONINI 19.44* 40.73* 29.39*   BNP: Invalid input(s): POCBNP D-Dimer: No results for input(s): DDIMER in the last 72 hours. Hemoglobin A1C: Recent  Labs    02/24/18 1944  HGBA1C 6.4*   Fasting Lipid Panel: Recent Labs    02/24/18 1944  CHOL 228*  HDL 36*  LDLCALC 165*  TRIG 134  CHOLHDL 6.3   Thyroid Function Tests: No results for input(s): TSH, T4TOTAL, T3FREE, THYROIDAB in the last 72 hours.  Invalid input(s): FREET3 Anemia Panel: No results for input(s): VITAMINB12, FOLATE, FERRITIN, TIBC, IRON, RETICCTPCT in the last 72 hours.  Dg Chest Portable 1 View  Result Date: 02/24/2018 CLINICAL DATA:  Chest pain since this morning EXAM: PORTABLE CHEST 1 VIEW COMPARISON:  Portable exam at 1315 hours compared to 10/20/2016 FINDINGS: Normal heart size, mediastinal contours, and pulmonary vascularity. Lungs clear. No pleural effusion or pneumothorax. Bones unremarkable. IMPRESSION: No acute abnormalities. Electronically Signed   By: Ulyses Southward M.D.   On: 02/24/2018 13:36     Echo   TELEMETRY: Sinus rhythm:  ASSESSMENT AND PLAN:  Active Problems:   Unstable angina (HCC)   NSTEMI (non-ST elevated myocardial infarction) (HCC)    1.  Non-ST elevation myocardial infarction 2.  One-vessel CAD, status post DES proximal and mid RCA 3.  Sinus bradycardia, asymptomatic  Recommendations  1.  Continue current medication 2.  Increase activity, begin ambulation 3.  Defer beta-blocker with baseline bradycardia 4.  Probable discharge in a.m.  Sign off for now, please call if any questions   Marcina MillardAlexander Zyen Triggs, MD, PhD, [facility] 02/25/2018 11:46 AM

## 2018-02-25 NOTE — Plan of Care (Signed)
  Problem: Education: Goal: Understanding of CV disease, CV risk reduction, and recovery process will improve Outcome: Progressing   Problem: Cardiovascular: Goal: Vascular access site(s) Level 0-1 will be maintained Outcome: Progressing   Problem: Health Behavior/Discharge Planning: Goal: Ability to safely manage health-related needs after discharge will improve Outcome: Progressing   

## 2018-02-25 NOTE — Care Management (Signed)
Patient to discharge on Effient which will be new medication.  Is on the medicaid preferred list under generic.  Communicated to attending.  [facility]

## 2018-02-25 NOTE — Progress Notes (Signed)
[facility] -  at [facility]   PATIENT NAME: Karen AcostaJanifer Potts    MR#:  [id number]  DATE OF BIRTH:  Nov 27, 1960  SUBJECTIVE:  CHIEF COMPLAINT:   Chief Complaint  Patient presents with  . Chest Pain  Patient seen today Has some shortness of breath whenever she gets up Chest pain better Has some groin pain at the cath site No fever and cough  REVIEW OF SYSTEMS:    ROS  CONSTITUTIONAL: No documented fever. No fatigue, weakness. No weight gain, no weight loss.  EYES: No blurry or double vision.  ENT: No tinnitus. No postnasal drip. No redness of the oropharynx.  RESPIRATORY: No cough, no wheeze, no hemoptysis.  Mild dyspnea.  CARDIOVASCULAR: Some chest pain. No orthopnea. No palpitations. No syncope.  GASTROINTESTINAL: No nausea, no vomiting or diarrhea. No abdominal pain. No melena or hematochezia.  GENITOURINARY: No dysuria or hematuria.  ENDOCRINE: No polyuria or nocturia. No heat or cold intolerance.  HEMATOLOGY: No anemia. No bruising. No bleeding.  INTEGUMENTARY: No rashes. No lesions.  MUSCULOSKELETAL: No arthritis. No swelling. No gout.  NEUROLOGIC: No numbness, tingling, or ataxia. No seizure-type activity.  PSYCHIATRIC: No anxiety. No insomnia. No ADD.   DRUG ALLERGIES:  No Known Allergies  VITALS:  Blood pressure 119/61, pulse (!) 48, temperature 97.9 F (36.6 C), temperature source Oral, resp. rate 16, height 5\' 7"  (1.702 m), weight 134.7 kg, SpO2 99 %.  PHYSICAL EXAMINATION:   Physical Exam  GENERAL:  57 y.o.-year-old patient lying in the bed with no acute distress.  EYES: Pupils equal, round, reactive to light and accommodation. No scleral icterus. Extraocular muscles intact.  HEENT: Head atraumatic, normocephalic. Oropharynx and nasopharynx clear.  NECK:  Supple, no jugular venous distention. No thyroid enlargement, no tenderness.  LUNGS: Normal breath sounds bilaterally, no wheezing, rales, rhonchi. No use of accessory muscles of  respiration.  CARDIOVASCULAR: S1, S2 normal. No murmurs, rubs, or gallops.  ABDOMEN: Soft, nontender, nondistended. Bowel sounds present. No organomegaly or mass.  EXTREMITIES: No cyanosis, clubbing or edema b/l.    Groin area Catheterization site no discharge NEUROLOGIC: Cranial nerves II through XII are intact. No focal Motor or sensory deficits b/l.   PSYCHIATRIC: The patient is alert and oriented x 3.  SKIN: No obvious rash, lesion, or ulcer.   LABORATORY PANEL:   CBC Recent Labs  Lab 02/25/18 0743  WBC 11.1*  HGB 12.4  HCT 37.7  PLT PLATELET CLUMPS NOTED ON SMEAR, UNABLE TO ESTIMATE   ------------------------------------------------------------------------------------------------------------------ Chemistries  Recent Labs  Lab 02/25/18 0743  NA 141  K 3.7  CL 109  CO2 25  GLUCOSE 121*  BUN 12  CREATININE 0.71  CALCIUM 8.3*   ------------------------------------------------------------------------------------------------------------------  Cardiac Enzymes Recent Labs  Lab 02/25/18 0743  TROPONINI 29.39*   ------------------------------------------------------------------------------------------------------------------  RADIOLOGY:  Dg Chest Portable 1 View  Result Date: 02/24/2018 CLINICAL DATA:  Chest pain since this morning EXAM: PORTABLE CHEST 1 VIEW COMPARISON:  Portable exam at 1315 hours compared to 10/20/2016 FINDINGS: Normal heart size, mediastinal contours, and pulmonary vascularity. Lungs clear. No pleural effusion or pneumothorax. Bones unremarkable. IMPRESSION: No acute abnormalities. Electronically Signed   By: Ulyses SouthwardMark  Boles M.D.   On: 02/24/2018 13:36     ASSESSMENT AND PLAN:  57 year old female patient with history of obesity, type 2 diabetes mellitus, hypertension, sleep apnea currently under hospitalist service for stable angina  -Coronary artery disease with obstruction Status post PCI DES  proximal and mid RCA Continue aspirin, statin, ace  inhibitor Has bradycardia, hold betablocker Cardiac rehab Appreciate cardiology evaluation  -Hypertension Continue ACE inhibitor  Add lasix Discontinue Hctz  -Diabetes mellitus type 2 Diabetic diet with sliding scale coverage with insulin  -Obstructive sleep apnea CPAP at bedtime  -Obesity Weight loss advised  -Tobacco abuse Tobacco cessation provided for 6 minutes Nicotine patch offered   All the records are reviewed and case discussed with Care Management/Social Worker. Management plans discussed with the patient, family and they are in agreement.  CODE STATUS:   DVT Prophylaxis: SCDs  TOTAL TIME TAKING CARE OF THIS PATIENT: 35 minutes.   POSSIBLE D/C IN 1 to 2 DAYS, DEPENDING ON CLINICAL CONDITION.  Ihor Austin M.D on 02/25/2018 at 11:48 AM  Between 7am to 6pm - Pager - [phone]  After 6pm go to [email or web address] - password [email or web address]  [facility] [place] [facility]  Office  [phone]  CC: Primary care physician; Center, [facility]  Note: This dictation was prepared with Dragon dictation along with smaller phrase technology. Any transcriptional errors that result from this process are unintentional.

## 2018-02-25 NOTE — Progress Notes (Addendum)
Patient is postop day 1 cardiac cath/ PCI through the right groin groin. Vascular site remained clean dry and intact. Post cath tenderness around the vascular site being managed with PRN med. Patient complaining of shortness of breath at rest. Dr. Darrold JunkerParaschos on the floor and notified. No new orders received.

## 2018-02-25 NOTE — Plan of Care (Signed)
  Problem: Education: Goal: Knowledge of General Education information will improve Description Including pain rating scale, medication(s)/side effects and non-pharmacologic comfort measures Outcome: Progressing   Problem: Health Behavior/Discharge Planning: Goal: Ability to manage health-related needs will improve Outcome: Progressing   Problem: Pain Managment: Goal: General experience of comfort will improve Outcome: Progressing   Problem: Education: Goal: Understanding of CV disease, CV risk reduction, and recovery process will improve Outcome: Progressing

## 2018-02-25 NOTE — Progress Notes (Signed)
Chaplain provided Advanced Directives education to the patient. Patient said, "I will be around for a long time," was not predisposed towards completion.  Still, Chaplain explained the booklet and the patient confirmed that she understood the explanation. Patient will probably require additional education.  Chaplain provided emotional support, active listening, and prayer. Patient was appreciative of the visit.

## 2018-02-25 NOTE — Progress Notes (Signed)
Patient is postop day 1 cardiac cath/ PCI through thr right groin groin. Vascular assess site remained clean dry and intact. Post cath tenderness around the vascular site is been managed with PRN pain med. Patient remained in asymptomatic SB in the 40s overnight.

## 2018-02-25 NOTE — Progress Notes (Addendum)
Pt was offered to walk in the nurses station but she states that she would like to to walked around 7:30AM-8:00Am. Will continue to monitor. Pt also complained of Right AC IV pained and pt states wants it removed. IV was removed. Will continue to monitor.

## 2018-02-26 LAB — BASIC METABOLIC PANEL
Anion gap: 8 (ref 5–15)
BUN: 14 mg/dL (ref 6–20)
CO2: 25 mmol/L (ref 22–32)
Calcium: 8.4 mg/dL — ABNORMAL LOW (ref 8.9–10.3)
Chloride: 106 mmol/L (ref 98–111)
Creatinine, Ser: 0.69 mg/dL (ref 0.44–1.00)
GFR calc Af Amer: >60 mL/min (ref >60)
GFR calc non Af Amer: >60 mL/min (ref >60)
Glucose, Bld: 121 mg/dL — ABNORMAL HIGH (ref 70–99)
Potassium: 3.6 mmol/L (ref 3.5–5.1)
Sodium: 139 mmol/L (ref 135–145)

## 2018-02-26 LAB — GLUCOSE, CAPILLARY: Glucose-Capillary: 111 mg/dL — ABNORMAL HIGH (ref 70–99)

## 2018-02-26 LAB — HIV ANTIBODY (ROUTINE TESTING W REFLEX): HIV Screen 4th Generation wRfx: NONREACTIVE

## 2018-02-26 MED ORDER — PRASUGREL HCL 10 MG PO TABS: 10.0000 mg | ORAL_TABLET | Freq: Every day | ORAL | 0 refills | Status: AC

## 2018-02-26 MED ORDER — NITROGLYCERIN 0.4 MG SL SUBL: 0.4000 mg | SUBLINGUAL_TABLET | SUBLINGUAL | 12 refills | Status: AC | PRN

## 2018-02-26 MED ORDER — ATORVASTATIN CALCIUM 80 MG PO TABS: 80.0000 mg | ORAL_TABLET | Freq: Every day | ORAL | 0 refills | Status: AC

## 2018-02-26 MED ORDER — ASPIRIN 81 MG PO CHEW: 81.0000 mg | CHEWABLE_TABLET | Freq: Every day | ORAL | 0 refills | Status: AC

## 2018-02-26 NOTE — Progress Notes (Signed)
Discharge instructions regarding Cardiac Rehab, Follow up appointments with PCP and Dr. Darrold JunkerParaschos, diet and exercise changes for heart disease prevention, and quitting smoking.

## 2018-02-26 NOTE — Care Management (Addendum)
[facility] No anticipated discharge needs. Update at 1254P: RNCM request that RN contact MD for outpatient cardiac rehab order to be entered in Epic.

## 2018-02-26 NOTE — Progress Notes (Signed)
Discharged to home with her sister.

## 2018-02-26 NOTE — Discharge Summary (Signed)
[facility] - [place] at [facility]   PATIENT NAME: Karen Potts    MR#:  [id number]  DATE OF BIRTH:  1960-08-01  DATE OF ADMISSION:  02/24/2018 ADMITTING PHYSICIAN: Marcina Millard, MD  DATE OF DISCHARGE: 02/26/2018 12:54 PM  PRIMARY CARE PHYSICIAN: Center, [facility]   ADMISSION DIAGNOSIS:  Chest pain, unspecified type [R07.9] NSTEMI (non-ST elevated myocardial infarction) (HCC) [I21.4] Hypertension Sleep apnea Type 2 diabetes mellitus DISCHARGE DIAGNOSIS:  Active Problems:   Unstable angina (HCC)   NSTEMI (non-ST elevated myocardial infarction) (HCC) Hypertension Sleep apnea Type 2 diabetes mellitus  SECONDARY DIAGNOSIS:   Past Medical History:  Diagnosis Date  . Diabetes mellitus without complication (HCC)   . Hypertension   . Sleep apnea   . Vertigo      ADMITTING HISTORY Karen Potts  is a 57 y.o. female with a known history of obesity, diabetes, essential hypertension, sleep apnea not using CPAP who presents today to the emergency room due to chest pain.  Patient reports after she took a shower at approximate 11:00 AM this morning she developed sudden onset 10 out of 10 substernal chest pain radiating to the left arm.  It was associate with shortness of breath, lightheadedness and nausea.Her family called EMS and she presents to the ER with the symptoms.  In the EMS truck she received baby aspirin and when she came to the ER she received nitroglycerin and morphine.  Her EKG shows some minimal ST elevations in aVF and lead III as well as ST depressions in 1 and aVL.  She was evaluated by Dr. Evette Georges who is recommending patient be emergently sent to cardiac catheterization lab due to ongoing chest pain and concerning EKG changes.He is currently having 5 out of 10 chest pain.  No exacerbating factors are reported.  She does report that the nitro and morphine did relieve some of the pain.   HOSPITAL COURSE:  Patient was admitted  to telemetry.  Was seen by [facility] cardiology during the hospitalization.  Her troponins were significantly elevated underwent cardiac catheterization and stents were placed successfully in the proximal and mid RCA.  Patient received aspirin, statin and ACE inhibitor during the hospitalization.  In view of bradycardia beta-blocker was held.  Cardiology follow-up was appreciated.  Patient's chest pain and shortness of breath completely resolved.  Tobacco cessation was counseled.  She will will be discharged home on cardiac meds along with enrollment in cardiac rehab program.  CONSULTS OBTAINED:  Treatment Team:  Marcina Millard, MD  DRUG ALLERGIES:  No Known Allergies  DISCHARGE MEDICATIONS:   Allergies as of 02/26/2018   No Known Allergies     Medication List    STOP taking these medications   docusate sodium 100 MG capsule Commonly known as:  COLACE   HYDROcodone-acetaminophen 5-325 MG tablet Commonly known as:  NORCO/VICODIN     TAKE these medications   aspirin 81 MG chewable tablet Chew 1 tablet (81 mg total) by mouth daily. Start taking on:  02/27/2018   atorvastatin 80 MG tablet Commonly known as:  LIPITOR Take 1 tablet (80 mg total) by mouth daily at 6 PM. What changed:    medication strength  how much to take   lisinopril-hydrochlorothiazide 10-12.5 MG tablet Commonly known as:  PRINZIDE,ZESTORETIC TAKE 1 TABLET BY MOUTH ONCE DAILY   meclizine 12.5 MG tablet Commonly known as:  ANTIVERT Take 1 tablet (12.5 mg total) by mouth 3 (three) times daily as needed for dizziness or nausea.  metFORMIN 500 MG tablet Commonly known as:  GLUCOPHAGE Take 500 mg by mouth 2 (two) times daily with a meal.   nitroGLYCERIN 0.4 MG SL tablet Commonly known as:  NITROSTAT Place 1 tablet (0.4 mg total) under the tongue every 5 (five) minutes as needed for chest pain.   prasugrel 10 MG Tabs tablet Commonly known as:  EFFIENT Take 1 tablet (10 mg total) by mouth  daily. Start taking on:  02/27/2018       Today  Patient seen and evaluated today No chest pain No shortness of breath Tolerating diet well  VITAL SIGNS:  Blood pressure (!) 134/55, pulse (!) 49, temperature 98.3 F (36.8 C), temperature source Oral, resp. rate 18, height 5\' 7"  (1.702 m), weight 134.7 kg, SpO2 97 %.  I/O:    Intake/Output Summary (Last 24 hours) at 02/26/2018 1520 Last data filed at 02/26/2018 1044 Gross per 24 hour  Intake 600 ml  Output 1400 ml  Net -800 ml    PHYSICAL EXAMINATION:  Physical Exam  GENERAL:  57 y.o.-year-old patient lying in the bed with no acute distress.  LUNGS: Normal breath sounds bilaterally, no wheezing, rales,rhonchi or crepitation. No use of accessory muscles of respiration.  CARDIOVASCULAR: S1, S2 normal. No murmurs, rubs, or gallops.  ABDOMEN: Soft, non-tender, non-distended. Bowel sounds present. No organomegaly or mass.  NEUROLOGIC: Moves all 4 extremities. PSYCHIATRIC: The patient is alert and oriented x 3.  SKIN: No obvious rash, lesion, or ulcer.   DATA REVIEW:   CBC Recent Labs  Lab 02/25/18 0743  WBC 11.1*  HGB 12.4  HCT 37.7  PLT PLATELET CLUMPS NOTED ON SMEAR, UNABLE TO ESTIMATE    Chemistries  Recent Labs  Lab 02/26/18 0443  NA 139  K 3.6  CL 106  CO2 25  GLUCOSE 121*  BUN 14  CREATININE 0.69  CALCIUM 8.4*    Cardiac Enzymes Recent Labs  Lab 02/25/18 0743  TROPONINI 29.39*    Microbiology Results  No results found for this or any previous visit.  RADIOLOGY:  No results found.  Follow up with PCP in 1 week.  Management plans discussed with the patient, family and they are in agreement.  CODE STATUS: Full code    Code Status Orders  (From admission, onward)         Start     Ordered   02/24/18 1939  Full code  Continuous     02/24/18 1938        Code Status History    Date Active Date Inactive Code Status Order ID Comments User Context   02/24/2018 1544 02/24/2018 1938 Full  Code [id number]  Marcina MillardParaschos, Alexander, MD Inpatient      TOTAL TIME TAKING CARE OF THIS PATIENT ON DAY OF DISCHARGE: more than 35 minutes.   Ihor AustinPavan Bowen Goyal M.D on 02/26/2018 at 3:20 PM  Between 7am to 6pm - Pager - [phone]  After 6pm go to [email or web address] - password [email or web address]  [facility] [place] [facility]  Office  [phone]  CC: Primary care physician; [facility]  Note: This dictation was prepared with Dragon dictation along with smaller phrase technology. Any transcriptional errors that result from this process are unintentional.

## 2018-03-01 ENCOUNTER — Telehealth: Payer: Self-pay

## 2018-03-01 NOTE — Telephone Encounter (Signed)
EMMI Follow-up: Noted on the report that the patient wanted to know who to call if there was a change in her condition and she had other questions/problems.  I talked with Mrs. Karen Potts and she wanted to know if she could continue with her physical therapy (PT) on her arm as she had shoulder surgery prior to her having a heart attack and was getting towards the end of her PT appointments.  I suggested she contact her PCP if she had any change in condition and to call on Tuesday to talk with someone in the Cardiac/Pulmonary Dept. About continuing her PT and setting up a follow-up appointment..   She said to thank the RN CM who gave her the coupon for the Effient as it would have been over $400 without the coupon and she wouldn't have been able to get it. She thanked me for calling back from the [facility] call. I let her know there would be a second automated call with a different series of questions and to let us know if she had concerns at that time.

## 2018-03-08 ENCOUNTER — Encounter: Payer: Medicaid Other | Attending: Cardiology

## 2018-03-10 ENCOUNTER — Telehealth: Payer: Self-pay

## 2018-03-10 NOTE — Telephone Encounter (Signed)
EMMI Follow-up: Noted on the report that the patient had some other questions.  I called and talked with Ms. Karen Potts and she had gone to her follow-up appointment this morning and PCP suggested when she sees the cardiologist to ask about putting her on a less expensive blood thinner.  She is interested and feels like the 8 week course will be helpful to her but due to the cost she is waiting to see if they approve her Medicaid.  No needs noted for today.

## 2018-03-15 DIAGNOSIS — Z955 Presence of coronary angioplasty implant and graft: Secondary | ICD-10-CM | POA: Insufficient documentation

## 2018-04-10 ENCOUNTER — Other Ambulatory Visit: Payer: Self-pay

## 2018-04-10 ENCOUNTER — Emergency Department
Admission: EM | Admit: 2018-04-10 | Discharge: 2018-04-10 | Disposition: A | Discharge status: Home / Self Care | Payer: Medicaid Other | Attending: Emergency Medicine | Admitting: Emergency Medicine

## 2018-04-10 ENCOUNTER — Encounter: Payer: Self-pay | Admitting: Emergency Medicine

## 2018-04-10 DIAGNOSIS — I252 Old myocardial infarction: Secondary | ICD-10-CM | POA: Diagnosis not present

## 2018-04-10 DIAGNOSIS — R42 Dizziness and giddiness: Secondary | ICD-10-CM

## 2018-04-10 DIAGNOSIS — E119 Type 2 diabetes mellitus without complications: Secondary | ICD-10-CM | POA: Insufficient documentation

## 2018-04-10 DIAGNOSIS — Z79899 Other long term (current) drug therapy: Secondary | ICD-10-CM | POA: Insufficient documentation

## 2018-04-10 DIAGNOSIS — F17210 Nicotine dependence, cigarettes, uncomplicated: Secondary | ICD-10-CM | POA: Insufficient documentation

## 2018-04-10 DIAGNOSIS — I10 Essential (primary) hypertension: Secondary | ICD-10-CM | POA: Diagnosis not present

## 2018-04-10 DIAGNOSIS — Z7984 Long term (current) use of oral hypoglycemic drugs: Secondary | ICD-10-CM | POA: Insufficient documentation

## 2018-04-10 LAB — COMPREHENSIVE METABOLIC PANEL
ALT: 32 U/L (ref 0–44)
AST: 41 U/L (ref 15–41)
Albumin: 3.9 g/dL (ref 3.5–5.0)
Alkaline Phosphatase: 47 U/L (ref 38–126)
Anion gap: 10 (ref 5–15)
BUN: 14 mg/dL (ref 6–20)
CO2: 25 mmol/L (ref 22–32)
Calcium: 9 mg/dL (ref 8.9–10.3)
Chloride: 105 mmol/L (ref 98–111)
Creatinine, Ser: 0.7 mg/dL (ref 0.44–1.00)
GFR calc Af Amer: >60 mL/min (ref >60)
GFR calc non Af Amer: >60 mL/min (ref >60)
Glucose, Bld: 125 mg/dL — ABNORMAL HIGH (ref 70–99)
Potassium: 3.5 mmol/L (ref 3.5–5.1)
Sodium: 140 mmol/L (ref 135–145)
Total Bilirubin: 0.8 mg/dL (ref 0.3–1.2)
Total Protein: 7.6 g/dL (ref 6.5–8.1)

## 2018-04-10 LAB — CBC WITH DIFFERENTIAL/PLATELET
Abs Immature Granulocytes: 0.01 10*3/uL (ref 0.00–0.07)
Basophils Absolute: 0 10*3/uL (ref 0.0–0.1)
Basophils Relative: 0 %
Eosinophils Absolute: 0.4 10*3/uL (ref 0.0–0.5)
Eosinophils Relative: 4 %
HCT: 42 % (ref 36.0–46.0)
Hemoglobin: 13.7 g/dL (ref 12.0–15.0)
Immature Granulocytes: 0 %
Lymphocytes Relative: 56 %
Lymphs Abs: 5 10*3/uL — ABNORMAL HIGH (ref 0.7–4.0)
MCH: 27.9 pg (ref 26.0–34.0)
MCHC: 32.6 g/dL (ref 30.0–36.0)
MCV: 85.5 fL (ref 80.0–100.0)
Monocytes Absolute: 0.6 10*3/uL (ref 0.1–1.0)
Monocytes Relative: 6 %
Neutro Abs: 3.1 10*3/uL (ref 1.7–7.7)
Neutrophils Relative %: 34 %
Platelets: 107 10*3/uL — ABNORMAL LOW (ref 150–400)
RBC: 4.91 MIL/uL (ref 3.87–5.11)
RDW: 14.6 % (ref 11.5–15.5)
WBC: 9.2 10*3/uL (ref 4.0–10.5)
nRBC: 0 % (ref 0.0–0.2)

## 2018-04-10 LAB — GLUCOSE, CAPILLARY: Glucose-Capillary: 112 mg/dL — ABNORMAL HIGH (ref 70–99)

## 2018-04-10 MED ORDER — DIAZEPAM 5 MG PO TABS: 5.0000 mg | ORAL_TABLET | Freq: Once | ORAL | Status: DC

## 2018-04-10 MED ORDER — MECLIZINE HCL 25 MG PO TABS: 25.0000 mg | ORAL_TABLET | Freq: Three times a day (TID) | ORAL | 0 refills | Status: DC | PRN

## 2018-04-10 MED ORDER — MECLIZINE HCL 25 MG PO TABS
25.0000 mg | ORAL_TABLET | Freq: Once | ORAL | Status: AC
  Administered 2018-04-10: 25 mg via ORAL
  Filled 2018-04-10: qty 1

## 2018-04-10 NOTE — ED Notes (Signed)
Pt able to ambulate to the rest room with minimal assistance. Pt reports that when ambulating she does not have sensation of room spinning.

## 2018-04-10 NOTE — ED Notes (Addendum)
Dr. Derrill Kay notified of pts symptoms of sudden onset dizziness and headache by Aundra Millet, RN. MD advised not to call Code stroke until he has evaluated pt

## 2018-04-10 NOTE — ED Provider Notes (Signed)
[facility] Provider Note  ____________________________________________   I have reviewed the triage vital signs and the nursing notes.   HISTORY  Chief Complaint Dizzinesss  History limited by: Not Limited   HPI Karen Potts is a 57 y.o. female who presents to the emergency department today concerns for dizziness.  The patient first noticed it this morning.  She states it is a sensation of the room spinning around her.  It is worse when she moves her head or tried to get up.  When she lies flat she states that the symptoms resolved.  She denies any headache with this but states she gets an odd feeling in her head when the dizziness comes.  Patient also states she has some left arm numbness and tingling which has been present since her heart attack a couple of months ago.  Appears the symptoms wax and wane and they were little worse this morning although by the time my evaluation the numbness is resolved.  Patient denies any recent head trauma.    Per medical record review patient has a history of DM, HTN.  Past Medical History:  Diagnosis Date  . Diabetes mellitus without complication (HCC)   . Hypertension   . Sleep apnea   . Vertigo     Patient Active Problem List   Diagnosis Date Noted  . Unstable angina (HCC) 02/24/2018  . NSTEMI (non-ST elevated myocardial infarction) (HCC) 02/24/2018    Past Surgical History:  Procedure Laterality Date  . ABDOMINAL HYSTERECTOMY    . CORONARY STENT INTERVENTION N/A 02/24/2018   Procedure: CORONARY STENT INTERVENTION;  Surgeon: Marcina Millard, MD;  Location: [facility];  Service: Cardiovascular;  Laterality: N/A;  . LEFT HEART CATH AND CORONARY ANGIOGRAPHY Right 02/24/2018   Procedure: LEFT HEART CATH AND CORONARY ANGIOGRAPHY;  Surgeon: Marcina Millard, MD;  Location: [facility];  Service: Cardiovascular;  Laterality: Right;  . SHOULDER ARTHROSCOPY WITH OPEN  ROTATOR CUFF REPAIR Left 08/14/2017   Procedure: SHOULDER ARTHROSCOPY WITH ROTATOR CUFF REPAIR, BICEPS TENOTOMY, SUBACROMIAL DECOMPRESSION;  Surgeon: Lyndle Herrlich, MD;  Location: [facility];  Service: Orthopedics;  Laterality: Left;  . UMBILICAL HERNIA REPAIR      Prior to Admission medications   Medication Sig Start Date End Date Taking? Authorizing Provider  atorvastatin (LIPITOR) 80 MG tablet Take 1 tablet (80 mg total) by mouth daily at 6 PM. 02/26/18 03/28/18  Pyreddy, Vivien Rota, MD  lisinopril-hydrochlorothiazide (PRINZIDE,ZESTORETIC) 10-12.5 MG tablet TAKE 1 TABLET BY MOUTH ONCE DAILY 05/01/17   Chrismon, Jodell Cipro, PA  meclizine (ANTIVERT) 12.5 MG tablet Take 1 tablet (12.5 mg total) by mouth 3 (three) times daily as needed for dizziness or nausea. 12/13/17   Sharyn Creamer, MD  metFORMIN (GLUCOPHAGE) 500 MG tablet Take 500 mg by mouth 2 (two) times daily with a meal.    [provider]  nitroGLYCERIN (NITROSTAT) 0.4 MG SL tablet Place 1 tablet (0.4 mg total) under the tongue every 5 (five) minutes as needed for chest pain. 02/26/18   Ihor Austin, MD    Allergies Patient has no known allergies.  No family history on file.  Social History Social History   Tobacco Use  . Smoking status: Current Every Day Smoker    Types: Cigarettes  . Smokeless tobacco: Never Used  Substance Use Topics  . Alcohol use: No  . Drug use: No    Review of Systems Constitutional: No fever/chills Eyes: No visual changes. ENT: No sore throat.  Cardiovascular: Denies chest pain. Respiratory: Denies shortness of breath. Gastrointestinal: No abdominal pain.  No nausea, no vomiting.  No diarrhea.   Genitourinary: Negative for dysuria. Musculoskeletal: Negative for back pain. Skin: Negative for rash. Neurological: Positive for dizziness. ____________________________________________   PHYSICAL EXAM:  VITAL SIGNS: ED Triage Vitals  Enc Vitals Group     BP 04/10/18 0903 (!) 175/63     Pulse Rate  04/10/18 0903 (!) 49     Resp 04/10/18 0903 15     Temp 04/10/18 0915 97.7 F (36.5 C)     Temp Source 04/10/18 0903 Oral     SpO2 04/10/18 0903 94 %     Weight 04/10/18 0903 290 lb (131.5 kg)     Height 04/10/18 0903 5\' 7"  (1.702 m)     Head Circumference --      Peak Flow --      Pain Score 04/10/18 0903 6   Constitutional: Alert and oriented.  Eyes: Conjunctivae are normal.  ENT      Head: Normocephalic and atraumatic.      Nose: No congestion/rhinnorhea.      Mouth/Throat: Mucous membranes are moist.      Neck: No stridor. Hematological/Lymphatic/Immunilogical: No cervical lymphadenopathy. Cardiovascular: Normal rate, regular rhythm.  No murmurs, rubs, or gallops.  Respiratory: Normal respiratory effort without tachypnea nor retractions. Breath sounds are clear and equal bilaterally. No wheezes/rales/rhonchi. Gastrointestinal: Soft and non tender. No rebound. No guarding.  Genitourinary: Deferred Musculoskeletal: Normal range of motion in all extremities. No lower extremity edema. Neurologic:  Normal speech and language. No gross focal neurologic deficits are appreciated.  Skin:  Skin is warm, dry and intact. No rash noted. Psychiatric: Mood and affect are normal. Speech and behavior are normal. Patient exhibits appropriate insight and judgment.  ____________________________________________    LABS (pertinent positives/negatives)  CBC wbc 9.2, hgb 13.7, plt 107 CMP wnl except glu 125   ____________________________________________   EKG  I, Phineas Semen, attending physician, personally viewed and interpreted this EKG  EKG Time: 0906 Rate: 52 Rhythm: sinus bradycardia Axis: normal Intervals: qtc 448 QRS: narrow, LVH ST changes: no st elevation Impression: abnormal ekg   ____________________________________________     RADIOLOGY  None  ____________________________________________   PROCEDURES  Procedures  ____________________________________________   INITIAL IMPRESSION / ASSESSMENT AND PLAN / ED COURSE  Pertinent labs & imaging results that were available during my care of the patient were reviewed by me and considered in my medical decision making (see chart for details).   Patient presented to the emergency department today because of concerns for dizziness. Concern primarily for vertigo which patient has had in the past. Patient was given antivert with good relief of her symptoms. Will discharge with antivert. Will give ENT follow up information  ____________________________________________   FINAL CLINICAL IMPRESSION(S) / ED DIAGNOSES  Final diagnoses:  Vertigo     Note: This dictation was prepared with Dragon dictation. Any transcriptional errors that result from this process are unintentional     Phineas Semen, MD 04/10/18 1219

## 2018-04-10 NOTE — Discharge Instructions (Addendum)
Please seek medical attention for any high fevers, chest pain, shortness of breath, change in behavior, persistent vomiting, bloody stool or any other new or concerning symptoms.  

## 2018-04-10 NOTE — ED Notes (Signed)
Pt c/o tingling in left arm, pt noted to have little effort against gravity in left arm. Pt states that left arm is weaker than right at baseline but she is usually able to hold it up.

## 2018-04-10 NOTE — ED Triage Notes (Signed)
Pt to ED via [facility] from home for sudden onset dizziness this morning. Pt states that she woke up around 0800 and then dizziness started around 0805. Upon arrival to ED pt also c/o headache 6/10. Pt is in NAD at this time.

## 2018-04-10 NOTE — ED Notes (Signed)
RN in to reassess pt, pt states that dizziness has improved some but she still has dizziness when turning head. Pt has no further needs at this time. RN will continue to monitor.

## 2018-04-10 NOTE — ED Notes (Signed)
Discharge instructions and RX were reviewed with pt and family. Pt and family denies any questions at this time and verbalized understanding of D/C instructions and medication. Pt signed discharge document, however, computer logged off and did not save signature.

## 2018-05-31 ENCOUNTER — Ambulatory Visit
Admission: RE | Admit: 2018-05-31 | Discharge: 2018-05-31 | Disposition: A | Discharge status: Home / Self Care | Payer: Medicaid Other | Source: Ambulatory Visit | Attending: Oncology | Admitting: Oncology

## 2018-05-31 DIAGNOSIS — N63 Unspecified lump in unspecified breast: Secondary | ICD-10-CM

## 2018-06-02 ENCOUNTER — Other Ambulatory Visit: Payer: Self-pay | Admitting: *Deleted

## 2018-06-02 ENCOUNTER — Encounter: Payer: Self-pay | Admitting: *Deleted

## 2018-06-02 DIAGNOSIS — N63 Unspecified lump in unspecified breast: Secondary | ICD-10-CM

## 2018-06-02 NOTE — Progress Notes (Signed)
Letter mailed to inform patient of need to return in 6 months for her next mammogram.  Appointment to return to our [facility] clinic on 12/01/18.  [facility] to [place].

## 2018-07-05 ENCOUNTER — Encounter: Payer: Medicaid Other | Attending: Cardiology | Admitting: *Deleted

## 2018-07-05 ENCOUNTER — Encounter: Payer: Self-pay | Admitting: *Deleted

## 2018-07-05 VITALS — Ht 68.5 in | Wt 294.0 lb

## 2018-07-05 DIAGNOSIS — I252 Old myocardial infarction: Secondary | ICD-10-CM | POA: Insufficient documentation

## 2018-07-05 DIAGNOSIS — Z87891 Personal history of nicotine dependence: Secondary | ICD-10-CM | POA: Insufficient documentation

## 2018-07-05 DIAGNOSIS — Z79899 Other long term (current) drug therapy: Secondary | ICD-10-CM | POA: Diagnosis not present

## 2018-07-05 DIAGNOSIS — E119 Type 2 diabetes mellitus without complications: Secondary | ICD-10-CM | POA: Insufficient documentation

## 2018-07-05 DIAGNOSIS — Z7984 Long term (current) use of oral hypoglycemic drugs: Secondary | ICD-10-CM | POA: Insufficient documentation

## 2018-07-05 DIAGNOSIS — G4730 Sleep apnea, unspecified: Secondary | ICD-10-CM | POA: Diagnosis not present

## 2018-07-05 DIAGNOSIS — Z955 Presence of coronary angioplasty implant and graft: Secondary | ICD-10-CM | POA: Diagnosis not present

## 2018-07-05 DIAGNOSIS — I10 Essential (primary) hypertension: Secondary | ICD-10-CM | POA: Insufficient documentation

## 2018-07-05 DIAGNOSIS — Z7902 Long term (current) use of antithrombotics/antiplatelets: Secondary | ICD-10-CM | POA: Diagnosis not present

## 2018-07-05 DIAGNOSIS — I214 Non-ST elevation (NSTEMI) myocardial infarction: Secondary | ICD-10-CM

## 2018-07-05 DIAGNOSIS — Z720 Tobacco use: Secondary | ICD-10-CM | POA: Insufficient documentation

## 2018-07-05 NOTE — Patient Instructions (Signed)
Patient Instructions  Patient Details  Name: Karen Potts MRN: [id number] Date of Birth: August 17, 1960 Referring Provider:  Marcina Millard, MD  Below are your personal goals for exercise, nutrition, and risk factors. Our goal is to help you stay on track towards obtaining and maintaining these goals. We will be discussing your progress on these goals with you throughout the program.  Initial Exercise Prescription: Initial Exercise Prescription - 07/05/18 1400      Date of Initial Exercise RX and Referring Provider   Date  07/05/18    Referring Provider  Paraschos, Alexander MD      Treadmill   MPH  1.9    Grade  0.5    Minutes  15    METs  2.59      Recumbant Bike   Level  3    RPM  50    Watts  42    Minutes  15    METs  2.5      NuStep   Level  3    SPM  80    Minutes  15    METs  2.5      Prescription Details   Frequency (times per week)  2    Duration  Progress to 30 minutes of continuous aerobic without signs/symptoms of physical distress      Intensity   THRR 40-80% of Max Heartrate  109-144    Ratings of Perceived Exertion  11-13    Perceived Dyspnea  0-4      Progression   Progression  Continue to progress workloads to maintain intensity without signs/symptoms of physical distress.      Resistance Training   Training Prescription  Yes    Weight  3 lbs    Reps  10-15       Exercise Goals: Frequency: Be able to perform aerobic exercise two to three times per week in program working toward 2-5 days per week of home exercise.  Intensity: Work with a perceived exertion of 11 (fairly light) - 15 (hard) while following your exercise prescription.  We will make changes to your prescription with you as you progress through the program.   Duration: Be able to do 30 to 45 minutes of continuous aerobic exercise in addition to a 5 minute warm-up and a 5 minute cool-down routine.   Nutrition Goals: Your personal nutrition goals will be established when  you do your nutrition analysis with the dietician.  The following are general nutrition guidelines to follow: Cholesterol < 200mg /day Sodium < 1500mg /day Fiber: Women over 50 yrs - 21 grams per day  Personal Goals: Personal Goals and Risk Factors at Admission - 07/05/18 1256      Core Components/Risk Factors/Patient Goals on Admission    Weight Management  Yes;Obesity    Intervention  Weight Management: Develop a combined nutrition and exercise program designed to reach desired caloric intake, while maintaining appropriate intake of nutrient and fiber, sodium and fats, and appropriate energy expenditure required for the weight goal.    Admit Weight  294 lb (133.4 kg)    Goal Weight: Short Term  290 lb (131.5 kg)    Goal Weight: Long Term  275 lb (124.7 kg)    Expected Outcomes  Short Term: Continue to assess and modify interventions until short term weight is achieved;Long Term: Adherence to nutrition and physical activity/exercise program aimed toward attainment of established weight goal;Weight Loss: Understanding of general recommendations for a balanced deficit meal plan, which promotes 1-2  lb weight loss per week and includes a negative energy balance of [phone] kcal/d;Understanding recommendations for meals to include 15-35% energy as protein, 25-35% energy from fat, 35-60% energy from carbohydrates, less than 200mg  of dietary cholesterol, 20-35 gm of total fiber daily    Tobacco Cessation  Yes   Karen Potts said she quit smoking the day of her heart attack.   Number of packs per day  0    Expected Outcomes  Long Term: Complete abstinence from all tobacco products for at least 12 months from quit date.    Diabetes  Yes    Intervention  Provide education about signs/symptoms and action to take for hypo/hyperglycemia.;Provide education about proper nutrition, including hydration, and aerobic/resistive exercise prescription along with prescribed medications to achieve blood glucose in normal  ranges: Fasting glucose 65-99 mg/dL    Expected Outcomes  Short Term: Participant verbalizes understanding of the signs/symptoms and immediate care of hyper/hypoglycemia, proper foot care and importance of medication, aerobic/resistive exercise and nutrition plan for blood glucose control.;Long Term: Attainment of HbA1C < 7%.    Stress  Yes    Intervention  Offer individual and/or small group education and counseling on adjustment to heart disease, stress management and health-related lifestyle change. Teach and support self-help strategies.;Refer participants experiencing significant psychosocial distress to appropriate mental health specialists for further evaluation and treatment. When possible, include family members and significant others in education/counseling sessions.    Expected Outcomes  Short Term: Participant demonstrates changes in health-related behavior, relaxation and other stress management skills, ability to obtain effective social support, and compliance with psychotropic medications if prescribed.       Tobacco Use Initial Evaluation: Social History   Tobacco Use  Smoking Status Former Smoker  . Types: Cigarettes  . Last attempt to quit: 02/24/2018  . Years since quitting: 0.3  Smokeless Tobacco Never Used  Tobacco Comment   Karen Potts said she quit smoking the day of her heart attack.    Exercise Goals and Review: Exercise Goals    Row Name 07/05/18 1419             Exercise Goals   Increase Physical Activity  Yes       Intervention  Provide advice, education, support and counseling about physical activity/exercise needs.;Develop an individualized exercise prescription for aerobic and resistive training based on initial evaluation findings, risk stratification, comorbidities and participant's personal goals.       Expected Outcomes  Short Term: Attend rehab on a regular basis to increase amount of physical activity.;Long Term: Add in home exercise to make exercise  part of routine and to increase amount of physical activity.;Long Term: Exercising regularly at least 3-5 days a week.       Increase Strength and Stamina  Yes       Intervention  Provide advice, education, support and counseling about physical activity/exercise needs.;Develop an individualized exercise prescription for aerobic and resistive training based on initial evaluation findings, risk stratification, comorbidities and participant's personal goals.       Expected Outcomes  Short Term: Increase workloads from initial exercise prescription for resistance, speed, and METs.;Short Term: Perform resistance training exercises routinely during rehab and add in resistance training at home;Long Term: Improve cardiorespiratory fitness, muscular endurance and strength as measured by increased METs and functional capacity (6MWT)       Able to understand and use rate of perceived exertion (RPE) scale  Yes       Intervention  Provide education and explanation on how  to use RPE scale       Expected Outcomes  Short Term: Able to use RPE daily in rehab to express subjective intensity level;Long Term:  Able to use RPE to guide intensity level when exercising independently       Able to understand and use Dyspnea scale  Yes       Intervention  Provide education and explanation on how to use Dyspnea scale       Expected Outcomes  Short Term: Able to use Dyspnea scale daily in rehab to express subjective sense of shortness of breath during exertion;Long Term: Able to use Dyspnea scale to guide intensity level when exercising independently       Knowledge and understanding of Target Heart Rate Range (THRR)  Yes       Intervention  Provide education and explanation of THRR including how the numbers were predicted and where they are located for reference       Expected Outcomes  Short Term: Able to state/look up THRR;Short Term: Able to use daily as guideline for intensity in rehab;Long Term: Able to use THRR to govern  intensity when exercising independently       Able to check pulse independently  Yes       Intervention  Provide education and demonstration on how to check pulse in carotid and radial arteries.;Review the importance of being able to check your own pulse for safety during independent exercise       Expected Outcomes  Short Term: Able to explain why pulse checking is important during independent exercise;Long Term: Able to check pulse independently and accurately       Understanding of Exercise Prescription  Yes       Intervention  Provide education, explanation, and written materials on patient's individual exercise prescription       Expected Outcomes  Short Term: Able to explain program exercise prescription;Long Term: Able to explain home exercise prescription to exercise independently          Copy of goals given to participant.

## 2018-07-05 NOTE — Progress Notes (Signed)
Cardiac Individual Treatment Plan  Patient Details  Name: Karen Potts MRN: [id number] Date of Birth: Aug 19, 1960 Referring Provider:     Cardiac Rehab from 07/05/2018 in [facility] Cardiac and Pulmonary Rehab  Referring Provider  Isaias Cowman MD      Initial Encounter Date:    Cardiac Rehab from 07/05/2018 in [facility] Cardiac and Pulmonary Rehab  Date  07/05/18      Visit Diagnosis: NSTEMI (non-ST elevated myocardial infarction) [facility])  Status post coronary artery stent placement  Patient's Home Medications on Admission:  Current Outpatient Medications:  .  lisinopril-hydrochlorothiazide (PRINZIDE,ZESTORETIC) 10-12.5 MG tablet, TAKE 1 TABLET BY MOUTH ONCE DAILY, Disp: 90 tablet, Rfl: 1 .  loratadine (CLARITIN) 10 MG tablet, Take by mouth., Disp: , Rfl:  .  meclizine (ANTIVERT) 25 MG tablet, Take 1 tablet (25 mg total) by mouth 3 (three) times daily as needed for dizziness., Disp: 30 tablet, Rfl: 0 .  metFORMIN (GLUCOPHAGE) 500 MG tablet, Take 500 mg by mouth 2 (two) times daily with a meal., Disp: , Rfl:  .  nitroGLYCERIN (NITROSTAT) 0.4 MG SL tablet, Place 1 tablet (0.4 mg total) under the tongue every 5 (five) minutes as needed for chest pain., Disp: 30 tablet, Rfl: 12 .  prasugrel (EFFIENT) 10 MG TABS tablet, Take by mouth., Disp: , Rfl:  .  atorvastatin (LIPITOR) 80 MG tablet, Take 1 tablet (80 mg total) by mouth daily at 6 PM. (Patient not taking: Reported on 07/05/2018), Disp: 30 tablet, Rfl: 0 .  cyclobenzaprine (FLEXERIL) 10 MG tablet, cyclobenzaprine 10 mg tablet  Take 1 tablet every 8 hours by oral route for 3 days., Disp: , Rfl:  .  meclizine (ANTIVERT) 12.5 MG tablet, Take 1 tablet (12.5 mg total) by mouth 3 (three) times daily as needed for dizziness or nausea. (Patient not taking: Reported on 07/05/2018), Disp: 30 tablet, Rfl: 1 .  meloxicam (MOBIC) 15 MG tablet, meloxicam 15 mg tablet, Disp: , Rfl:  .  metFORMIN (GLUCOPHAGE) 500 MG tablet, Take by mouth., Disp: , Rfl:    Past Medical History: Past Medical History:  Diagnosis Date  . Diabetes mellitus without complication ([place])   . Hypertension   . Sleep apnea   . Vertigo     Tobacco Use: Social History   Tobacco Use  Smoking Status Former Smoker  . Types: Cigarettes  . Last attempt to quit: 02/24/2018  . Years since quitting: 0.3  Smokeless Tobacco Never Used  Tobacco Comment   Khamora said she quit smoking the day of her heart attack.    Labs: Recent Review Flowsheet Data    Labs for ITP Cardiac and Pulmonary Rehab Latest Ref Rng & Units 02/24/2018   Cholestrol 0 - 200 mg/dL 228(H)   LDLCALC 0 - 99 mg/dL 165(H)   HDL >40 mg/dL 36(L)   Trlycerides <150 mg/dL 134   Hemoglobin A1c 4.8 - 5.6 % 6.4(H)       Exercise Target Goals: Exercise Program Goal: Individual exercise prescription set using results from initial 6 min walk test and THRR while considering  patient's activity barriers and safety.   Exercise Prescription Goal: Initial exercise prescription builds to 30-45 minutes a day of aerobic activity, 2-3 days per week.  Home exercise guidelines will be given to patient during program as part of exercise prescription that the participant will acknowledge.  Activity Barriers & Risk Stratification: Activity Barriers & Cardiac Risk Stratification - 07/05/18 1413      Activity Barriers & Cardiac Risk Stratification  Activity Barriers  Balance Concerns;Deconditioning;Muscular Weakness;Shortness of Breath;Joint Problems   vertigo, rotator cuff repair 07/2017, hip pain   Cardiac Risk Stratification  Moderate       6 Minute Walk: 6 Minute Walk    Row Name 07/05/18 1410         6 Minute Walk   Phase  Initial     Distance  1020 feet     Walk Time  6 minutes     # of Rest Breaks  0     MPH  1.93     METS  3     RPE  17     Perceived Dyspnea   3     VO2 Peak  10.53     Symptoms  Yes (comment)     Comments  hip pain 8/10; side stitch 4/10     Resting HR  73 bpm     Resting  BP  142/74     Resting Oxygen Saturation   97 %     Exercise Oxygen Saturation  during 6 min walk  99 %     Max Ex. HR  145 bpm     Max Ex. BP  176/84     2 Minute Post BP  172/80 140/80        Oxygen Initial Assessment:   Oxygen Re-Evaluation:   Oxygen Discharge (Final Oxygen Re-Evaluation):   Initial Exercise Prescription: Initial Exercise Prescription - 07/05/18 1400      Date of Initial Exercise RX and Referring Provider   Date  07/05/18    Referring Provider  Paraschos, Alexander MD      Treadmill   MPH  1.9    Grade  0.5    Minutes  15    METs  2.59      Recumbant Bike   Level  3    RPM  50    Watts  42    Minutes  15    METs  2.5      NuStep   Level  3    SPM  80    Minutes  15    METs  2.5      Prescription Details   Frequency (times per week)  2    Duration  Progress to 30 minutes of continuous aerobic without signs/symptoms of physical distress      Intensity   THRR 40-80% of Max Heartrate  109-144    Ratings of Perceived Exertion  11-13    Perceived Dyspnea  0-4      Progression   Progression  Continue to progress workloads to maintain intensity without signs/symptoms of physical distress.      Resistance Training   Training Prescription  Yes    Weight  3 lbs    Reps  10-15       Perform Capillary Blood Glucose checks as needed.  Exercise Prescription Changes:  Exercise Prescription Changes    Row Name 07/05/18 1400             Response to Exercise   Blood Pressure (Admit)  142/74       Blood Pressure (Exercise)  176/84       Blood Pressure (Exit)  172/80 rck 142/74       Heart Rate (Admit)  73 bpm       Heart Rate (Exercise)  145 bpm       Heart Rate (Exit)  75 bpm       Oxygen  Saturation (Admit)  97 %       Oxygen Saturation (Exercise)  99 %       Rating of Perceived Exertion (Exercise)  17       Perceived Dyspnea (Exercise)  3       Symptoms  SOB, hip pain 8/10; side stitch 4/10       Comments  walk test results           Exercise Comments:   Exercise Goals and Review:  Exercise Goals    Row Name 07/05/18 1419             Exercise Goals   Increase Physical Activity  Yes       Intervention  Provide advice, education, support and counseling about physical activity/exercise needs.;Develop an individualized exercise prescription for aerobic and resistive training based on initial evaluation findings, risk stratification, comorbidities and participant's personal goals.       Expected Outcomes  Short Term: Attend rehab on a regular basis to increase amount of physical activity.;Long Term: Add in home exercise to make exercise part of routine and to increase amount of physical activity.;Long Term: Exercising regularly at least 3-5 days a week.       Increase Strength and Stamina  Yes       Intervention  Provide advice, education, support and counseling about physical activity/exercise needs.;Develop an individualized exercise prescription for aerobic and resistive training based on initial evaluation findings, risk stratification, comorbidities and participant's personal goals.       Expected Outcomes  Short Term: Increase workloads from initial exercise prescription for resistance, speed, and METs.;Short Term: Perform resistance training exercises routinely during rehab and add in resistance training at home;Long Term: Improve cardiorespiratory fitness, muscular endurance and strength as measured by increased METs and functional capacity (6MWT)       Able to understand and use rate of perceived exertion (RPE) scale  Yes       Intervention  Provide education and explanation on how to use RPE scale       Expected Outcomes  Short Term: Able to use RPE daily in rehab to express subjective intensity level;Long Term:  Able to use RPE to guide intensity level when exercising independently       Able to understand and use Dyspnea scale  Yes       Intervention  Provide education and explanation on how to use Dyspnea  scale       Expected Outcomes  Short Term: Able to use Dyspnea scale daily in rehab to express subjective sense of shortness of breath during exertion;Long Term: Able to use Dyspnea scale to guide intensity level when exercising independently       Knowledge and understanding of Target Heart Rate Range (THRR)  Yes       Intervention  Provide education and explanation of THRR including how the numbers were predicted and where they are located for reference       Expected Outcomes  Short Term: Able to state/look up THRR;Short Term: Able to use daily as guideline for intensity in rehab;Long Term: Able to use THRR to govern intensity when exercising independently       Able to check pulse independently  Yes       Intervention  Provide education and demonstration on how to check pulse in carotid and radial arteries.;Review the importance of being able to check your own pulse for safety during independent exercise       Expected Outcomes  Short Term: Able to explain why pulse checking is important during independent exercise;Long Term: Able to check pulse independently and accurately       Understanding of Exercise Prescription  Yes       Intervention  Provide education, explanation, and written materials on patient's individual exercise prescription       Expected Outcomes  Short Term: Able to explain program exercise prescription;Long Term: Able to explain home exercise prescription to exercise independently          Exercise Goals Re-Evaluation :   Discharge Exercise Prescription (Final Exercise Prescription Changes): Exercise Prescription Changes - 07/05/18 1400      Response to Exercise   Blood Pressure (Admit)  142/74    Blood Pressure (Exercise)  176/84    Blood Pressure (Exit)  172/80   rck 142/74   Heart Rate (Admit)  73 bpm    Heart Rate (Exercise)  145 bpm    Heart Rate (Exit)  75 bpm    Oxygen Saturation (Admit)  97 %    Oxygen Saturation (Exercise)  99 %    Rating of Perceived  Exertion (Exercise)  17    Perceived Dyspnea (Exercise)  3    Symptoms  SOB, hip pain 8/10; side stitch 4/10    Comments  walk test results       Nutrition:  Target Goals: Understanding of nutrition guidelines, daily intake of sodium <158m, cholesterol <202m calories 30% from fat and 7% or less from saturated fats, daily to have 5 or more servings of fruits and vegetables.  Biometrics: Pre Biometrics - 07/05/18 1420      Pre Biometrics   Height  5' 8.5" (1.74 m)    Weight  294 lb (133.4 kg)    Waist Circumference  46.5 inches    Hip Circumference  59.5 inches    Waist to Hip Ratio  0.78 %    BMI (Calculated)  44.05    Single Leg Stand  11.38 seconds        Nutrition Therapy Plan and Nutrition Goals: Nutrition Therapy & Goals - 07/05/18 1252      Nutrition Therapy   Drug/Food Interactions  Statins/Certain Fruits      Intervention Plan   Intervention  Prescribe, educate and counsel regarding individualized specific dietary modifications aiming towards targeted core components such as weight, hypertension, lipid management, diabetes, heart failure and other comorbidities.    Expected Outcomes  Short Term Goal: Understand basic principles of dietary content, such as calories, fat, sodium, cholesterol and nutrients.;Long Term Goal: Adherence to prescribed nutrition plan.       Nutrition Assessments: Nutrition Assessments - 07/05/18 1252      MEDFICTS Scores   Pre Score  41       Nutrition Goals Re-Evaluation:   Nutrition Goals Discharge (Final Nutrition Goals Re-Evaluation):   Psychosocial: Target Goals: Acknowledge presence or absence of significant depression and/or stress, maximize coping skills, provide positive support system. Participant is able to verbalize types and ability to use techniques and skills needed for reducing stress and depression.   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 One month before it closed she fell at work and  hurt her shoulder so got Workmen's Comp help and unemployment. Had a heart attack then was told the place she rented was being sold and they had to move in less than a month. Currently she is living in a hotel. Her grandchildren and son moved in with his girlfriend. Her husband is in the [place] [facility] waiting for long term placement.       Family Dynamics   Good Support System?  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 One month before it closed she fell at work and hurt her shoulder so got Workmen's Comp help and unemployment which has run out. Had a heart attack then was told the place she rented was being sold and they had to move in less than a month. Currently she is living in a hotel. Her grandchildren and son moved in with his girlfriend. Her husband is in the [place] hospital waiting for long term placement. Alyssabeth recently got Medicaid. She said she quit reapplying for food stamps since they based it off her husbands income so she only got $12/month. [place] said Voc Rehab may not help her now since "I really dont' have a place to live yet". Has filled out paperwork for [place] [place] [facility] housing and waiting to get approved. Serenna said she prays a lot.      Barriers   Psychosocial barriers to participate in program  The patient should benefit from training in stress management and relaxation.      Screening Interventions   Interventions  Encouraged to exercise;Program counselor consult       Quality of Life Scores:  Quality of Life - 07/05/18 1251      Quality of Life   Select  Quality of Life      Quality of Life Scores    Health/Function Pre  15 %    Socioeconomic Pre  11 %    Psych/Spiritual Pre  19.71 %    Family Pre  14.4 %    GLOBAL Pre  15.19 %      Scores of 19 and below usually indicate a poorer quality of life in these areas.  A difference of  2-3 points is a clinically meaningful difference.  A difference of 2-3 points in the total score of the Quality of Life Index has been associated with significant improvement in overall quality of life, self-image, physical symptoms, and general health in studies assessing change in quality of life.  PHQ-9: Recent Review Flowsheet Data    Depression screen [facility] 2/9 07/05/2018   Decreased Interest 0   Down, Depressed, Hopeless 1   PHQ - 2 Score 1   Altered sleeping 0   Tired, decreased energy 3   Change in appetite 2   Feeling bad or failure about yourself  1   Trouble concentrating 0   Moving slowly or fidgety/restless 0   Suicidal thoughts 0   PHQ-9 Score 7   Difficult doing work/chores Not difficult at all     Interpretation of Total Score  Total Score Depression Severity:  1-4 = Minimal depression, 5-9 = Mild depression, 10-14 = Moderate depression, 15-19 = Moderately severe depression, 20-27 = Severe depression   Psychosocial Evaluation and Intervention: Psychosocial Evaluation - 07/05/18 1251      Psychosocial Evaluation & Interventions   Interventions  Therapist referral    Continue  Psychosocial Services   Follow up required by counselor       Psychosocial Re-Evaluation:   Psychosocial Discharge (Final Psychosocial Re-Evaluation):   Vocational Rehabilitation: Provide vocational rehab assistance to qualifying candidates.   Vocational Rehab Evaluation & Intervention: Vocational Rehab - 07/05/18 1255      Initial Vocational Rehab Evaluation & Intervention   Assessment shows need for Vocational Rehabilitation  No      Vocational Rehab Re-Evaulation   Comments  Sabrin wants to wait until she gets housing before she thinks about Voc  Rehab.       Education: Education Goals: Education classes will be provided on a variety of topics geared toward better understanding of heart health and risk factor modification. Participant will state understanding/return demonstration of topics presented as noted by education test scores.  Learning Barriers/Preferences: Learning Barriers/Preferences - 07/05/18 1253      Learning Barriers/Preferences   Learning Barriers  Sight    Learning Preferences  Verbal Instruction;Written Material       Education Topics:  AED/CPR: - Group verbal and written instruction with the use of models to demonstrate the basic use of the AED with the basic ABC's of resuscitation.   General Nutrition Guidelines/Fats and Fiber: -Group instruction provided by verbal, written material, models and posters to present the general guidelines for heart healthy nutrition. Gives an explanation and review of dietary fats and fiber.   Controlling Sodium/Reading Food Labels: -Group verbal and written material supporting the discussion of sodium use in heart healthy nutrition. Review and explanation with models, verbal and written materials for utilization of the food label.   Exercise Physiology & General Exercise Guidelines: - Group verbal and written instruction with models to review the exercise physiology of the cardiovascular system and associated critical values. Provides general exercise guidelines with specific guidelines to those with heart or lung disease.    Aerobic Exercise & Resistance Training: - Gives group verbal and written instruction on the various components of exercise. Focuses on aerobic and resistive training programs and the benefits of this training and how to safely progress through these programs..   Flexibility, Balance, Mind/Body Relaxation: Provides group verbal/written instruction on the benefits of flexibility and balance training, including mind/body exercise modes such as yoga,  pilates and tai chi.  Demonstration and skill practice provided.   Stress and Anxiety: - Provides group verbal and written instruction about the health risks of elevated stress and causes of high stress.  Discuss the correlation between heart/lung disease and anxiety and treatment options. Review healthy ways to manage with stress and anxiety.   Depression: - Provides group verbal and written instruction on the correlation between heart/lung disease and depressed mood, treatment options, and the stigmas associated with seeking treatment.   Anatomy & Physiology of the Heart: - Group verbal and written instruction and models provide basic cardiac anatomy and physiology, with the coronary electrical and arterial systems. Review of Valvular disease and Heart Failure   Cardiac Procedures: - Group verbal and written instruction to review commonly prescribed medications for heart disease. Reviews the medication, class of the drug, and side effects. Includes the steps to properly store meds and maintain the prescription regimen. (beta blockers and nitrates)   Cardiac Medications I: - Group verbal and written instruction to review commonly prescribed medications for heart disease. Reviews the medication, class of the drug, and side effects. Includes the steps to properly store meds and maintain the prescription regimen.   Cardiac Medications II: -Group verbal and written  instruction to review commonly prescribed medications for heart disease. Reviews the medication, class of the drug, and side effects. (all other drug classes)    Go Sex-Intimacy & Heart Disease, Get SMART - Goal Setting: - Group verbal and written instruction through game format to discuss heart disease and the return to sexual intimacy. Provides group verbal and written material to discuss and apply goal setting through the application of the S.M.A.R.T. Method.   Other Matters of the Heart: - Provides group verbal, written  materials and models to describe Stable Angina and Peripheral Artery. Includes description of the disease process and treatment options available to the cardiac patient.   Exercise & Equipment Safety: - Individual verbal instruction and demonstration of equipment use and safety with use of the equipment.   Cardiac Rehab from 07/05/2018 in [facility] Cardiac and Pulmonary Rehab  Date  07/05/18  Educator  Renita Papa, RN  Instruction Review Code  1- Verbalizes Understanding      Infection Prevention: - Provides verbal and written material to individual with discussion of infection control including proper hand washing and proper equipment cleaning during exercise session.   Cardiac Rehab from 07/05/2018 in [facility] Cardiac and Pulmonary Rehab  Date  07/05/18  Educator  Renita Papa, RN  Instruction Review Code  1- Verbalizes Understanding      Falls Prevention: - Provides verbal and written material to individual with discussion of falls prevention and safety.   Cardiac Rehab from 07/05/2018 in [facility] Cardiac and Pulmonary Rehab  Date  07/05/18  Educator  Renita Papa, RN  Instruction Review Code  1- Verbalizes Understanding      Diabetes: - Individual verbal and written instruction to review signs/symptoms of diabetes, desired ranges of glucose level fasting, after meals and with exercise. Acknowledge that pre and post exercise glucose checks will be done for 3 sessions at entry of program.   Know Your Numbers and Risk Factors: -Group verbal and written instruction about important numbers in your health.  Discussion of what are risk factors and how they play a role in the disease process.  Review of Cholesterol, Blood Pressure, Diabetes, and BMI and the role they play in your overall health.   Sleep Hygiene: -Provides group verbal and written instruction about how sleep can affect your health.  Define sleep hygiene, discuss sleep cycles and impact of sleep habits. Review good sleep  hygiene tips.    Other: -Provides group and verbal instruction on various topics (see comments)   Knowledge Questionnaire Score: Knowledge Questionnaire Score - 07/05/18 1254      Knowledge Questionnaire Score   Pre Score  20       Core Components/Risk Factors/Patient Goals at Admission: Personal Goals and Risk Factors at Admission - 07/05/18 1256      Core Components/Risk Factors/Patient Goals on Admission    Weight Management  Yes;Obesity    Intervention  Weight Management: Develop a combined nutrition and exercise program designed to reach desired caloric intake, while maintaining appropriate intake of nutrient and fiber, sodium and fats, and appropriate energy expenditure required for the weight goal.    Admit Weight  294 lb (133.4 kg)    Goal Weight: Short Term  290 lb (131.5 kg)    Goal Weight: Long Term  275 lb (124.7 kg)    Expected Outcomes  Short Term: Continue to assess and modify interventions until short term weight is achieved;Long Term: Adherence to nutrition and physical activity/exercise program aimed toward attainment of established weight goal;Weight Loss:  Understanding of general recommendations for a balanced deficit meal plan, which promotes 1-2 lb weight loss per week and includes a negative energy balance of [phone] kcal/d;Understanding recommendations for meals to include 15-35% energy as protein, 25-35% energy from fat, 35-60% energy from carbohydrates, less than 221m of dietary cholesterol, 20-35 gm of total fiber daily    Tobacco Cessation  Yes   Latrece said she quit smoking the day of her heart attack.   Number of packs per day  0    Expected Outcomes  Long Term: Complete abstinence from all tobacco products for at least 12 months from quit date.    Diabetes  Yes    Intervention  Provide education about signs/symptoms and action to take for hypo/hyperglycemia.;Provide education about proper nutrition, including hydration, and aerobic/resistive exercise  prescription along with prescribed medications to achieve blood glucose in normal ranges: Fasting glucose 65-99 mg/dL    Expected Outcomes  Short Term: Participant verbalizes understanding of the signs/symptoms and immediate care of hyper/hypoglycemia, proper foot care and importance of medication, aerobic/resistive exercise and nutrition plan for blood glucose control.;Long Term: Attainment of HbA1C < 7%.    Stress  Yes    Intervention  Offer individual and/or small group education and counseling on adjustment to heart disease, stress management and health-related lifestyle change. Teach and support self-help strategies.;Refer participants experiencing significant psychosocial distress to appropriate mental health specialists for further evaluation and treatment. When possible, include family members and significant others in education/counseling sessions.    Expected Outcomes  Short Term: Participant demonstrates changes in health-related behavior, relaxation and other stress management skills, ability to obtain effective social support, and compliance with psychotropic medications if prescribed.       Core Components/Risk Factors/Patient Goals Review:    Core Components/Risk Factors/Patient Goals at Discharge (Final Review):    ITP Comments: ITP Comments    Row Name 07/05/18 1420           ITP Comments  Med review completed. Initial ITP created. [facility]          Comments: Med Review completed. Initial ITP Created.   d [facility]2018.

## 2018-07-05 NOTE — Progress Notes (Signed)
Daily Session Note  Patient Details  Name: Karen Potts MRN: [id number] Date of Birth: August 01, 1960 Referring Provider:    Encounter Date: 07/05/2018  Check In: Session Check In - 07/05/18 1234      Check-In   Supervising physician immediately available to respond to emergencies  See telemetry face sheet for immediately available ER MD    Location  [facility]-Cardiac & Pulmonary Rehab    Staff Present  Karen Papa, RN BSN;Karen Stead, RN, BSN;Karen Hawkins, MA, RCEP, CCRP, Exercise Physiologist    Medication changes reported      No    Fall or balance concerns reported     No    Tobacco Cessation  Use Decreased   states she quit smoking the dayof her heart attack.   Warm-up and Cool-down  Not performed (comment)    Resistance Training Performed  Yes    VAD Patient?  No    PAD/SET Patient?  No      Pain Assessment   Currently in Pain?  No/denies          Social History   Tobacco Use  Smoking Status Former Smoker  . Types: Cigarettes  Smokeless Tobacco Never Used  Tobacco Comment   Karen Potts said she quit smoking the day of her heart attack.    Goals Met:  Proper associated with RPD/PD & O2 Sat Personal goals reviewed No report of cardiac concerns or symptoms Strength training completed today  Goals Unmet:  Not Applicable  Comments:  Med Review completed

## 2018-07-08 DIAGNOSIS — Z955 Presence of coronary angioplasty implant and graft: Secondary | ICD-10-CM

## 2018-07-08 DIAGNOSIS — I252 Old myocardial infarction: Secondary | ICD-10-CM | POA: Diagnosis not present

## 2018-07-08 DIAGNOSIS — I214 Non-ST elevation (NSTEMI) myocardial infarction: Secondary | ICD-10-CM

## 2018-07-08 LAB — GLUCOSE, CAPILLARY
Glucose-Capillary: 158 mg/dL — ABNORMAL HIGH (ref 70–99)
Glucose-Capillary: 151 mg/dL — ABNORMAL HIGH (ref 70–99)

## 2018-07-08 NOTE — Progress Notes (Signed)
Daily Session Note  Patient Details  Name: Karen Potts MRN: [id number] Date of Birth: [birth year] Referring Provider:     Cardiac Rehab from 07/05/2018 in [facility] Cardiac and Pulmonary Rehab  Referring Provider  Isaias Cowman MD      Encounter Date: 07/08/2018  Check In: Session Check In - 07/08/18 0926      Check-In   Supervising physician immediately available to respond to emergencies  See telemetry face sheet for immediately available ER MD    Location  [facility]    Staff Present  Gerlene Burdock, RN, BSN;Dawanna Grauberger BS, Exercise Physiologist;Jessica Stanhope, MA, RCEP, CCRP, Exercise Physiologist    Medication changes reported      No    Fall or balance concerns reported     No    Tobacco Cessation  No Change    Warm-up and Cool-down  Performed as group-led instruction    Resistance Training Performed  Yes    VAD Patient?  No    PAD/SET Patient?  No      Pain Assessment   Currently in Pain?  No/denies    Multiple Pain Sites  No          Social History   Tobacco Use  Smoking Status Former Smoker  . Types: Cigarettes  . Last attempt to quit: 02/24/2018  . Years since quitting: 0.3  Smokeless Tobacco Never Used  Tobacco Comment   Heli said she quit smoking the day of her heart attack.    Goals Met:  Independence with exercise equipment Exercise tolerated well No report of cardiac concerns or symptoms Strength training completed today  Goals Unmet:  Not Applicable  Comments: Pt able to follow exercise prescription today without complaint.  Will continue to monitor for progression. First full day of exercise!  Patient was oriented to gym and equipment including functions, settings, policies, and procedures.  Patient's individual exercise prescription and treatment plan were reviewed.  All starting workloads were established based on the results of the 6 minute walk test done at initial orientation visit.  The plan for exercise  progression was also introduced and progression will be customized based on patient's performance and goals.   Dr. Emily Filbert is Medical Director for [place] and [facility].

## 2018-07-13 ENCOUNTER — Encounter: Payer: Medicaid Other | Admitting: *Deleted

## 2018-07-13 DIAGNOSIS — I214 Non-ST elevation (NSTEMI) myocardial infarction: Secondary | ICD-10-CM

## 2018-07-13 DIAGNOSIS — I252 Old myocardial infarction: Secondary | ICD-10-CM | POA: Diagnosis not present

## 2018-07-13 DIAGNOSIS — Z955 Presence of coronary angioplasty implant and graft: Secondary | ICD-10-CM

## 2018-07-13 LAB — GLUCOSE, CAPILLARY
Glucose-Capillary: 161 mg/dL — ABNORMAL HIGH (ref 70–99)
Glucose-Capillary: 128 mg/dL — ABNORMAL HIGH (ref 70–99)

## 2018-07-13 NOTE — Progress Notes (Signed)
Daily Session Note  Patient Details  Name: Karen Potts MRN: [id number] Date of Birth: 07/25/1960 Referring Provider:     Cardiac Rehab from 07/05/2018 in [facility] Rehab  Referring Provider  Isaias Cowman MD      Encounter Date: 07/13/2018  Check In: Session Check In - 07/13/18 0931      Check-In   Supervising physician immediately available to respond to emergencies  See telemetry face sheet for immediately available ER MD    Location  [facility]    Staff Present  Heath Lark, RN, BSN, CCRP;Seham Gardenhire [place], MA, RCEP, CCRP, Exercise Physiologist;Joseph [employer], [place], ACSM CEP, Exercise Physiologist    Medication changes reported      No    Fall or balance concerns reported     No    Warm-up and Cool-down  Performed as group-led instruction    Resistance Training Performed  Yes    VAD Patient?  No    PAD/SET Patient?  No      Pain Assessment   Currently in Pain?  No/denies          Social History   Tobacco Use  Smoking Status Former Smoker  . Types: Cigarettes  . Last attempt to quit: 02/24/2018  . Years since quitting: 0.3  Smokeless Tobacco Never Used  Tobacco Comment   [name] said she quit smoking the day of her heart attack.    Goals Met:  Independence with exercise equipment Exercise tolerated well No report of cardiac concerns or symptoms Strength training completed today  Goals Unmet:  Not Applicable  Comments: Pt able to follow exercise prescription today without complaint.  Will continue to monitor for progression.    Dr. Emily Filbert is Medical Director for [place] and [facility].

## 2018-07-15 ENCOUNTER — Encounter: Payer: Medicaid Other | Admitting: *Deleted

## 2018-07-15 DIAGNOSIS — Z955 Presence of coronary angioplasty implant and graft: Secondary | ICD-10-CM

## 2018-07-15 DIAGNOSIS — I252 Old myocardial infarction: Secondary | ICD-10-CM | POA: Diagnosis not present

## 2018-07-15 DIAGNOSIS — I214 Non-ST elevation (NSTEMI) myocardial infarction: Secondary | ICD-10-CM

## 2018-07-15 LAB — GLUCOSE, CAPILLARY
Glucose-Capillary: 125 mg/dL — ABNORMAL HIGH (ref 70–99)
Glucose-Capillary: 113 mg/dL — ABNORMAL HIGH (ref 70–99)

## 2018-07-15 NOTE — Progress Notes (Signed)
Daily Session Note  Patient Details  Name: Karen Potts MRN: [id number] Date of Birth: 03-28-1961 Referring Provider:     Cardiac Rehab from 07/05/2018 in [facility] Rehab  Referring Provider  Isaias Cowman MD      Encounter Date: 07/15/2018  Check In: Session Check In - 07/15/18 0929      Check-In   Supervising physician immediately available to respond to emergencies  See telemetry face sheet for immediately available ER MD    Location  [facility]    Staff Present  Vida Rigger RN, BSN;Jeanna Durrell BS, Exercise Physiologist;Noor Witte Luan Pulling, MA, RCEP, CCRP, Exercise Physiologist;Joseph Tessie Fass RCP,RRT,BSRT    Medication changes reported      No    Fall or balance concerns reported     No    Warm-up and Cool-down  Performed as group-led instruction    Resistance Training Performed  Yes    VAD Patient?  No    PAD/SET Patient?  No      Pain Assessment   Currently in Pain?  No/denies          Social History   Tobacco Use  Smoking Status Former Smoker  . Types: Cigarettes  . Last attempt to quit: 02/24/2018  . Years since quitting: 0.3  Smokeless Tobacco Never Used  Tobacco Comment   Jurni said she quit smoking the day of her heart attack.    Goals Met:  Independence with exercise equipment Exercise tolerated well No report of cardiac concerns or symptoms Strength training completed today  Goals Unmet:  Not Applicable  Comments: Pt able to follow exercise prescription today without complaint.  Will continue to monitor for progression.    Dr. Emily Filbert is Medical Director for [place] and [facility].

## 2018-07-20 DIAGNOSIS — Z955 Presence of coronary angioplasty implant and graft: Secondary | ICD-10-CM

## 2018-07-20 DIAGNOSIS — I252 Old myocardial infarction: Secondary | ICD-10-CM | POA: Diagnosis not present

## 2018-07-20 DIAGNOSIS — I214 Non-ST elevation (NSTEMI) myocardial infarction: Secondary | ICD-10-CM

## 2018-07-20 NOTE — Progress Notes (Signed)
Daily Session Note  Patient Details  Name: Karen Potts MRN: [id number] Date of Birth: Nov 26, 1960 Referring Provider:     Cardiac Rehab from 07/05/2018 in [facility] Cardiac and Pulmonary Rehab  Referring Provider  Isaias Cowman MD      Encounter Date: 07/20/2018  Check In: Session Check In - 07/20/18 0945      Check-In   Staff Present  Heath Lark, RN, BSN, CCRP;Jeanna Durrell BS, Exercise Physiologist;Jessica Eleele, MA, RCEP, CCRP, Exercise Physiologist    Medication changes reported      No    Fall or balance concerns reported     No    Tobacco Cessation  No Change    Warm-up and Cool-down  Performed as group-led instruction    Resistance Training Performed  Yes    VAD Patient?  No    PAD/SET Patient?  No      Pain Assessment   Currently in Pain?  No/denies    Multiple Pain Sites  No          Social History   Tobacco Use  Smoking Status Former Smoker  . Types: Cigarettes  . Last attempt to quit: 02/24/2018  . Years since quitting: 0.4  Smokeless Tobacco Never Used  Tobacco Comment   Florestine said she quit smoking the day of her heart attack.    Goals Met:  Independence with exercise equipment Exercise tolerated well No report of cardiac concerns or symptoms Strength training completed today  Goals Unmet:  Not Applicable  Comments: Pt able to follow exercise prescription today without complaint.  Will continue to monitor for progression.  Reviewed home exercise with pt today.  Pt plans to walk or join [facility] for exercise.  Reviewed THR, pulse, RPE, sign and symptoms, NTG use, and when to call 911 or MD.  Also discussed weather considerations and indoor options.  She is also interested in getting help with purchasing new shoes.  We will work with the foundation to help her.  Pt voiced understanding.    Dr. Emily Filbert is Medical Director for [place] and [facility].

## 2018-07-22 DIAGNOSIS — I214 Non-ST elevation (NSTEMI) myocardial infarction: Secondary | ICD-10-CM

## 2018-07-22 DIAGNOSIS — I252 Old myocardial infarction: Secondary | ICD-10-CM | POA: Diagnosis not present

## 2018-07-22 NOTE — Progress Notes (Signed)
Daily Session Note  Patient Details  Name: Karen Potts MRN: [id number] Date of Birth: 10-28-1960 Referring Provider:     Cardiac Rehab from 07/05/2018 in [facility] Cardiac and Pulmonary Rehab  Referring Provider  Karen Cowman MD      Encounter Date: 07/22/2018  Check In: Session Check In - 07/22/18 0935      Check-In   Supervising physician immediately available to respond to emergencies  See telemetry face sheet for immediately available ER MD    Location  [facility]-Cardiac [facility] Pulmonary Rehab    Staff Present  Karen Sam, MA, RCEP, CCRP, Exercise Physiologist;Karen Enterkin, RN, BSN;Karen Potts RCP,RRT,BSRT    Medication changes reported      No    Fall or balance concerns reported     No    Warm-up and Cool-down  Performed as group-led instruction    Resistance Training Performed  Yes    VAD Patient?  No    PAD/SET Patient?  No      Pain Assessment   Currently in Pain?  No/denies          Social History   Tobacco Use  Smoking Status Former Smoker  . Types: Cigarettes  . Last attempt to quit: 02/24/2018  . Years since quitting: 0.4  Smokeless Tobacco Never Used  Tobacco Comment   Karen Potts said she quit smoking the day of her heart attack.    Goals Met:  Independence with exercise equipment Exercise tolerated well No report of cardiac concerns or symptoms Strength training completed today  Goals Unmet:  Not Applicable  Comments: Pt able to follow exercise prescription today without complaint.  Will continue to monitor for progression.    Dr. Emily Potts is Medical Director for [place] and [facility].

## 2018-07-27 DIAGNOSIS — Z955 Presence of coronary angioplasty implant and graft: Secondary | ICD-10-CM

## 2018-07-27 DIAGNOSIS — I214 Non-ST elevation (NSTEMI) myocardial infarction: Secondary | ICD-10-CM

## 2018-07-27 NOTE — Progress Notes (Signed)
Daily Session Note  Patient Details  Name: Karen Potts MRN: [id number] Date of Birth: 1960-12-17 Referring Provider:     Cardiac Rehab from 07/05/2018 in [facility] Cardiac and Pulmonary Rehab  Referring Provider  Karen Cowman MD      Encounter Date: 07/27/2018  Check In: Session Check In - 07/27/18 1001      Check-In   Supervising physician immediately available to respond to emergencies  See telemetry face sheet for immediately available ER MD    Location  [facility]Cardiac & Pulmonary Rehab    Staff Present  Karen Hong, RN BSN;Karen Potts BS, Exercise Physiologist;Karen Luan Pulling, MA, RCEP, CCRP, Exercise Physiologist;Karen Potts, BA, ACSM CEP, Exercise Physiologist    Medication changes reported      No    Fall or balance concerns reported     No    Tobacco Cessation  No Change    Warm-up and Cool-down  Performed as group-led instruction    Resistance Training Performed  Yes    VAD Patient?  No    PAD/SET Patient?  No      Pain Assessment   Currently in Pain?  No/denies    Multiple Pain Sites  No          Social History   Tobacco Use  Smoking Status Former Smoker  . Types: Cigarettes  . Last attempt to quit: 02/24/2018  . Years since quitting: 0.4  Smokeless Tobacco Never Used  Tobacco Comment   [name] said she quit smoking the day of her heart attack.    Goals Met:  Independence with exercise equipment Exercise tolerated well No report of cardiac concerns or symptoms Strength training completed today  Goals Unmet:  Not Applicable  Comments: Pt able to follow exercise prescription today without complaint.  Will continue to monitor for progression.    Dr. Emily Potts is Medical Director for [place] and [facility].

## 2018-07-28 ENCOUNTER — Encounter: Payer: Self-pay | Admitting: *Deleted

## 2018-07-28 DIAGNOSIS — Z955 Presence of coronary angioplasty implant and graft: Secondary | ICD-10-CM

## 2018-07-28 DIAGNOSIS — I214 Non-ST elevation (NSTEMI) myocardial infarction: Secondary | ICD-10-CM

## 2018-07-28 NOTE — Progress Notes (Signed)
Cardiac Individual Treatment Plan  Patient Details  Name: Karen Potts MRN: [id number] Date of Birth: 18-Sep-1960 Referring Provider:     Cardiac Rehab from 07/05/2018 in [facility] Cardiac and Pulmonary Rehab  Referring Provider  Isaias Cowman MD      Initial Encounter Date:    Cardiac Rehab from 07/05/2018 in [facility] Cardiac and Pulmonary Rehab  Date  07/05/18      Visit Diagnosis: NSTEMI (non-ST elevated myocardial infarction) [facility])  Status post coronary artery stent placement  Patient's Home Medications on Admission:  Current Outpatient Medications:  .  atorvastatin (LIPITOR) 80 MG tablet, Take 1 tablet (80 mg total) by mouth daily at 6 PM. (Patient not taking: Reported on 07/05/2018), Disp: 30 tablet, Rfl: 0 .  cyclobenzaprine (FLEXERIL) 10 MG tablet, cyclobenzaprine 10 mg tablet  Take 1 tablet every 8 hours by oral route for 3 days., Disp: , Rfl:  .  lisinopril-hydrochlorothiazide (PRINZIDE,ZESTORETIC) 10-12.5 MG tablet, TAKE 1 TABLET BY MOUTH ONCE DAILY, Disp: 90 tablet, Rfl: 1 .  loratadine (CLARITIN) 10 MG tablet, Take by mouth., Disp: , Rfl:  .  meclizine (ANTIVERT) 12.5 MG tablet, Take 1 tablet (12.5 mg total) by mouth 3 (three) times daily as needed for dizziness or nausea. (Patient not taking: Reported on 07/05/2018), Disp: 30 tablet, Rfl: 1 .  meclizine (ANTIVERT) 25 MG tablet, Take 1 tablet (25 mg total) by mouth 3 (three) times daily as needed for dizziness., Disp: 30 tablet, Rfl: 0 .  meloxicam (MOBIC) 15 MG tablet, meloxicam 15 mg tablet, Disp: , Rfl:  .  metFORMIN (GLUCOPHAGE) 500 MG tablet, Take 500 mg by mouth 2 (two) times daily with a meal., Disp: , Rfl:  .  metFORMIN (GLUCOPHAGE) 500 MG tablet, Take by mouth., Disp: , Rfl:  .  nitroGLYCERIN (NITROSTAT) 0.4 MG SL tablet, Place 1 tablet (0.4 mg total) under the tongue every 5 (five) minutes as needed for chest pain., Disp: 30 tablet, Rfl: 12 .  prasugrel (EFFIENT) 10 MG TABS tablet, Take by mouth., Disp: , Rfl:    Past Medical History: Past Medical History:  Diagnosis Date  . Diabetes mellitus without complication ([place])   . Hypertension   . Sleep apnea   . Vertigo     Tobacco Use: Social History   Tobacco Use  Smoking Status Former Smoker  . Types: Cigarettes  . Last attempt to quit: 02/24/2018  . Years since quitting: 0.4  Smokeless Tobacco Never Used  Tobacco Comment   Arabelle said she quit smoking the day of her heart attack.    Labs: Recent Review Flowsheet Data    Labs for ITP Cardiac and Pulmonary Rehab Latest Ref Rng & Units 02/24/2018   Cholestrol 0 - 200 mg/dL 228(H)   LDLCALC 0 - 99 mg/dL 165(H)   HDL >40 mg/dL 36(L)   Trlycerides <150 mg/dL 134   Hemoglobin A1c 4.8 - 5.6 % 6.4(H)       Exercise Target Goals: Exercise Program Goal: Individual exercise prescription set using results from initial 6 min walk test and THRR while considering  patient's activity barriers and safety.   Exercise Prescription Goal: Initial exercise prescription builds to 30-45 minutes a day of aerobic activity, 2-3 days per week.  Home exercise guidelines will be given to patient during program as part of exercise prescription that the participant will acknowledge.  Activity Barriers & Risk Stratification: Activity Barriers & Cardiac Risk Stratification - 07/05/18 1413      Activity Barriers & Cardiac Risk Stratification  Activity Barriers  Balance Concerns;Deconditioning;Muscular Weakness;Shortness of Breath;Joint Problems   vertigo, rotator cuff repair 07/2017, hip pain   Cardiac Risk Stratification  Moderate       6 Minute Walk: 6 Minute Walk    Row Name 07/05/18 1410         6 Minute Walk   Phase  Initial     Distance  1020 feet     Walk Time  6 minutes     # of Rest Breaks  0     MPH  1.93     METS  3     RPE  17     Perceived Dyspnea   3     VO2 Peak  10.53     Symptoms  Yes (comment)     Comments  hip pain 8/10; side stitch 4/10     Resting HR  73 bpm     Resting  BP  142/74     Resting Oxygen Saturation   97 %     Exercise Oxygen Saturation  during 6 min walk  99 %     Max Ex. HR  145 bpm     Max Ex. BP  176/84     2 Minute Post BP  172/80 140/80        Oxygen Initial Assessment:   Oxygen Re-Evaluation:   Oxygen Discharge (Final Oxygen Re-Evaluation):   Initial Exercise Prescription: Initial Exercise Prescription - 07/05/18 1400      Date of Initial Exercise RX and Referring Provider   Date  07/05/18    Referring Provider  Paraschos, Alexander MD      Treadmill   MPH  1.9    Grade  0.5    Minutes  15    METs  2.59      Recumbant Bike   Level  3    RPM  50    Watts  42    Minutes  15    METs  2.5      NuStep   Level  3    SPM  80    Minutes  15    METs  2.5      Prescription Details   Frequency (times per week)  2    Duration  Progress to 30 minutes of continuous aerobic without signs/symptoms of physical distress      Intensity   THRR 40-80% of Max Heartrate  109-144    Ratings of Perceived Exertion  11-13    Perceived Dyspnea  0-4      Progression   Progression  Continue to progress workloads to maintain intensity without signs/symptoms of physical distress.      Resistance Training   Training Prescription  Yes    Weight  3 lbs    Reps  10-15       Perform Capillary Blood Glucose checks as needed.  Exercise Prescription Changes: Exercise Prescription Changes    Row Name 07/05/18 1400 07/20/18 1100           Response to Exercise   Blood Pressure (Admit)  142/74  130/62      Blood Pressure (Exercise)  176/84  132/74      Blood Pressure (Exit)  172/80 rck 142/74  128/62      Heart Rate (Admit)  73 bpm  72 bpm      Heart Rate (Exercise)  145 bpm  91 bpm      Heart Rate (Exit)  75 bpm  67 bpm      Oxygen Saturation (Admit)  97 %  -      Oxygen Saturation (Exercise)  99 %  -      Rating of Perceived Exertion (Exercise)  17  15      Perceived Dyspnea (Exercise)  3  -      Symptoms  SOB, hip pain  8/10; side stitch 4/10  none      Comments  walk test results  -      Duration  -  Continue with 30 min of aerobic exercise without signs/symptoms of physical distress.      Intensity  -  THRR unchanged        Progression   Progression  -  Continue to progress workloads to maintain intensity without signs/symptoms of physical distress.      Average METs  -  1.63        Resistance Training   Training Prescription  -  Yes      Weight  -  3 lbs      Reps  -  10-15        Interval Training   Interval Training  -  No        Treadmill   MPH  -  0.5      Grade  -  0.5      Minutes  -  15      METs  -  1.4        NuStep   Level  -  3      Minutes  -  15      METs  -  1.6        Arm Ergometer   Level  -  1      Minutes  -  15      METs  -  1.9        Home Exercise Plan   Plans to continue exercise at  -  [employer] (comment) [employer] under husband, walking      Frequency  -  Add 3 additional days to program exercise sessions.      Initial Home Exercises Provided  -  07/20/18         Exercise Comments: Exercise Comments    Row Name 07/08/18 8675           Exercise Comments   First full day of exercise!  Patient was oriented to gym and equipment including functions, settings, policies, and procedures.  Patient's individual exercise prescription and treatment plan were reviewed.  All starting workloads were established based on the results of the 6 minute walk test done at initial orientation visit.  The plan for exercise progression was also introduced and progression will be customized based on patient's performance and goals.          Exercise Goals and Review: Exercise Goals    Row Name 07/05/18 1419             Exercise Goals   Increase Physical Activity  Yes       Intervention  Provide advice, education, support and counseling about physical activity/exercise needs.;Develop an individualized exercise prescription for aerobic and resistive training based on  initial evaluation findings, risk stratification, comorbidities and participant's personal goals.       Expected Outcomes  Short Term: Attend rehab on a regular basis to increase amount of physical activity.;Long Term: Add in home exercise to make exercise part of routine  and to increase amount of physical activity.;Long Term: Exercising regularly at least 3-5 days a week.       Increase Strength and Stamina  Yes       Intervention  Provide advice, education, support and counseling about physical activity/exercise needs.;Develop an individualized exercise prescription for aerobic and resistive training based on initial evaluation findings, risk stratification, comorbidities and participant's personal goals.       Expected Outcomes  Short Term: Increase workloads from initial exercise prescription for resistance, speed, and METs.;Short Term: Perform resistance training exercises routinely during rehab and add in resistance training at home;Long Term: Improve cardiorespiratory fitness, muscular endurance and strength as measured by increased METs and functional capacity (6MWT)       Able to understand and use rate of perceived exertion (RPE) scale  Yes       Intervention  Provide education and explanation on how to use RPE scale       Expected Outcomes  Short Term: Able to use RPE daily in rehab to express subjective intensity level;Long Term:  Able to use RPE to guide intensity level when exercising independently       Able to understand and use Dyspnea scale  Yes       Intervention  Provide education and explanation on how to use Dyspnea scale       Expected Outcomes  Short Term: Able to use Dyspnea scale daily in rehab to express subjective sense of shortness of breath during exertion;Long Term: Able to use Dyspnea scale to guide intensity level when exercising independently       Knowledge and understanding of Target Heart Rate Range (THRR)  Yes       Intervention  Provide education and explanation of  THRR including how the numbers were predicted and where they are located for reference       Expected Outcomes  Short Term: Able to state/look up THRR;Short Term: Able to use daily as guideline for intensity in rehab;Long Term: Able to use THRR to govern intensity when exercising independently       Able to check pulse independently  Yes       Intervention  Provide education and demonstration on how to check pulse in carotid and radial arteries.;Review the importance of being able to check your own pulse for safety during independent exercise       Expected Outcomes  Short Term: Able to explain why pulse checking is important during independent exercise;Long Term: Able to check pulse independently and accurately       Understanding of Exercise Prescription  Yes       Intervention  Provide education, explanation, and written materials on patient's individual exercise prescription       Expected Outcomes  Short Term: Able to explain program exercise prescription;Long Term: Able to explain home exercise prescription to exercise independently          Exercise Goals Re-Evaluation : Exercise Goals Re-Evaluation    Row Name 07/08/18 0929 07/20/18 1126           Exercise Goal Re-Evaluation   Exercise Goals Review  Increase Physical Activity;Able to understand and use rate of perceived exertion (RPE) scale;Knowledge and understanding of Target Heart Rate Range (THRR);Understanding of Exercise Prescription;Increase Strength and Stamina  Increase Physical Activity;Increase Strength and Stamina;Able to understand and use rate of perceived exertion (RPE) scale;Knowledge and understanding of Target Heart Rate Range (THRR);Able to check pulse independently;Understanding of Exercise Prescription      Comments  Reviewed RPE scale, THR and program prescription with pt today.  Pt voiced understanding and was given a copy of goals to take home.   Reviewed home exercise with pt today.  Pt plans to walk or join [facility]  for exercise.  Reviewed THR, pulse, RPE, sign and symptoms, NTG use, and when to call 911 or MD.  Also discussed weather considerations and indoor options.  She is also interested in getting help with purchasing new shoes.  We will work with the foundation to help her.       Expected Outcomes  Short: Use RPE daily to regulate intensity. Long: Follow program prescription in THR.  Short: Talk to [facility] about using husband's membership  Long: Exercise more at home         Discharge Exercise Prescription (Final Exercise Prescription Changes): Exercise Prescription Changes - 07/20/18 1100      Response to Exercise   Blood Pressure (Admit)  130/62    Blood Pressure (Exercise)  132/74    Blood Pressure (Exit)  128/62    Heart Rate (Admit)  72 bpm    Heart Rate (Exercise)  91 bpm    Heart Rate (Exit)  67 bpm    Rating of Perceived Exertion (Exercise)  15    Symptoms  none    Duration  Continue with 30 min of aerobic exercise without signs/symptoms of physical distress.    Intensity  THRR unchanged      Progression   Progression  Continue to progress workloads to maintain intensity without signs/symptoms of physical distress.    Average METs  1.63      Resistance Training   Training Prescription  Yes    Weight  3 lbs    Reps  10-15      Interval Training   Interval Training  No      Treadmill   MPH  0.5    Grade  0.5    Minutes  15    METs  1.4      NuStep   Level  3    Minutes  15    METs  1.6      Arm Ergometer   Level  1    Minutes  15    METs  1.9      Home Exercise Plan   Plans to continue exercise at  [employer] (comment)   [employer] under husband, walking   Frequency  Add 3 additional days to program exercise sessions.    Initial Home Exercises Provided  07/20/18       Nutrition:  Target Goals: Understanding of nutrition guidelines, daily intake of sodium <1513m, cholesterol <2070m calories 30% from fat and 7% or less from saturated fats, daily to have 5 or  more servings of fruits and vegetables.  Biometrics: Pre Biometrics - 07/05/18 1420      Pre Biometrics   Height  5' 8.5" (1.74 m)    Weight  294 lb (133.4 kg)    Waist Circumference  46.5 inches    Hip Circumference  59.5 inches    Waist to Hip Ratio  0.78 %    BMI (Calculated)  44.05    Single Leg Stand  11.38 seconds        Nutrition Therapy Plan and Nutrition Goals: Nutrition Therapy & Goals - 07/08/18 1104      Nutrition Therapy   Diet  DM    Drug/Food Interactions  Statins/Certain Fruits    Protein (specify units)  11oz    Fiber  20 grams    Whole Grain Foods  3 servings   recently switched from white to white-wheat bread   Saturated Fats  13 max. grams    Fruits and Vegetables  4 servings/day   8 ideal; eats 2 meals/day but tries to include soft fruits and vegetables   Sodium  1500 grams      Personal Nutrition Goals   Nutrition Goal  Include sources of protein and fiber as part of your regular, daily diet to better manage BG levels    Personal Goal #2  Continue to decrease sugar concentration of soda by watering it down, and work on decreasing soda / sugar sweetened beverage intake in general    Personal Goal #3  Great job for switching from white to white-wheat bread. Work your way to choosing 100% whole wheat bread, especially since you eat bread so often    Comments  Pt and her husband are both diabetics. She had all of her teeth pulled last year and is waiting on Medicare to approve her for dentures. Wt loss of 10-15# reported d/t not being able to eat all of the foods she used to with teeth and eating less overall. She is happier at this weight. She has recently applied for Food Stamps d/t change in both job and living situations. She eats soft fruits (whole or fruit cup) and vegetables. She rinses off canned vegetables before consuming. Does not eat fried foods often d/t difficulty chewing. She has been trialing yogurt as a snack option and typically eats 2  meals/day. Brunch: cereal, sausage egg biscuit from [employer], eggs, bread. Dinner: salad, vegetables, hamburger, fish, potatoes, bread, peanut butter and jelly sandwich. Beverages: water (trying to drink more), 2% milk, orange juice, soda. Explains she drinks OJ and milk together at brunch and has been watering down her soda d/t disliking diet soda. She does not add extra salt to anything she eats      Intervention Plan   Intervention  Prescribe, educate and counsel regarding individualized specific dietary modifications aiming towards targeted core components such as weight, hypertension, lipid management, diabetes, heart failure and other comorbidities.;Nutrition handout(s) given to patient.   General meal planning guidelines for DM type II   Expected Outcomes  Short Term Goal: A plan has been developed with personal nutrition goals set during dietitian appointment.;Short Term Goal: Understand basic principles of dietary content, such as calories, fat, sodium, cholesterol and nutrients.;Long Term Goal: Adherence to prescribed nutrition plan.       Nutrition Assessments: Nutrition Assessments - 07/05/18 1252      MEDFICTS Scores   Pre Score  41       Nutrition Goals Re-Evaluation: Nutrition Goals Re-Evaluation    [place] Name 07/08/18 1133             Goals   Nutrition Goal  Great job for switching from white to white-wheat bread. Work your way to choosing 100% whole wheat bread, especially since you eat bread so often       Comment  She eats bread with most meals and recently switched to the white-wheat variety       Expected Outcome  She will choose 100% whole wheat bread and monitor portions of bread consumed daily in relation to other foods to encourage diet variety         Personal Goal #2 Re-Evaluation   Personal Goal #2  Continue to decrease sugar concentration of soda by watering it  down, and work on decreasing soda / sugar sweetened beverage intake in general         Personal  Goal #3 Re-Evaluation   Personal Goal #3  Include sources of protein and fiber as part of your regular, daily diet to better manage BG levels          Nutrition Goals Discharge (Final Nutrition Goals Re-Evaluation): Nutrition Goals Re-Evaluation - 07/08/18 1133      Goals   Nutrition Goal  Great job for switching from white to white-wheat bread. Work your way to choosing 100% whole wheat bread, especially since you eat bread so often    Comment  She eats bread with most meals and recently switched to the white-wheat variety    Expected Outcome  She will choose 100% whole wheat bread and monitor portions of bread consumed daily in relation to other foods to encourage diet variety      Personal Goal #2 Re-Evaluation   Personal Goal #2  Continue to decrease sugar concentration of soda by watering it down, and work on decreasing soda / sugar sweetened beverage intake in general      Personal Goal #3 Re-Evaluation   Personal Goal #3  Include sources of protein and fiber as part of your regular, daily diet to better manage BG levels       Psychosocial: Target Goals: Acknowledge presence or absence of significant depression and/or stress, maximize coping skills, provide positive support system. Participant is able to verbalize types and ability to use techniques and skills needed for reducing stress and depression.   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 One month before it closed she fell at work and hurt her shoulder so got Workmen's Comp help and unemployment. Had a heart attack then was told the place she rented was being sold and they had to move in less than a month. Currently she is living in a hotel. Her grandchildren  and son moved in with his girlfriend. Her husband is in the [place] hospital waiting for long term placement.       Family Dynamics   Good Support System?  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 One month before it closed she fell at work and hurt her shoulder so got Workmen's Comp help and unemployment which has run out. Had a heart attack then was told the place she rented was being sold and they had to move in less than a month. Currently she is living in a hotel. Her grandchildren and son moved in with his girlfriend. Her husband is in the [place] hospital waiting for long term placement. [place] recently got Medicaid. She said she quit reapplying for food stamps since they based it off her husbands income so she only got $12/month. Fredi said Voc Rehab may not help her now since "I really dont' have a place to live yet". Has filled out paperwork for [place] housing and waiting to get approved. Maicey said she prays a lot.      Barriers  Psychosocial barriers to participate in program  The patient should benefit from training in stress management and relaxation.      Screening Interventions   Interventions  Encouraged to exercise;Program counselor consult       Quality of Life Scores:  Quality of Life - 07/05/18 1251      Quality of Life   Select  Quality of Life      Quality of Life Scores   Health/Function Pre  15 %    Socioeconomic Pre  11 %    Psych/Spiritual Pre  19.71 %    Family Pre  14.4 %    GLOBAL Pre  15.19 %      Scores of 19 and below usually indicate a poorer quality of life in these areas.  A difference of  2-3 points is a clinically meaningful difference.  A difference of 2-3 points in the total score of the Quality of Life Index has been associated with significant improvement in overall quality of life, self-image, physical symptoms, and general  health in studies assessing change in quality of life.  PHQ-9: Recent Review Flowsheet Data    Depression screen [facility] 2/9 07/05/2018   Decreased Interest 0   Down, Depressed, Hopeless 1   PHQ - 2 Score 1   Altered sleeping 0   Tired, decreased energy 3   Change in appetite 2   Feeling bad or failure about yourself  1   Trouble concentrating 0   Moving slowly or fidgety/restless 0   Suicidal thoughts 0   PHQ-9 Score 7   Difficult doing work/chores Not difficult at all     Interpretation of Total Score  Total Score Depression Severity:  1-4 = Minimal depression, 5-9 = Mild depression, 10-14 = Moderate depression, 15-19 = Moderately severe depression, 20-27 = Severe depression   Psychosocial Evaluation and Intervention: Psychosocial Evaluation - 07/15/18 1048      Psychosocial Evaluation & Interventions   Interventions  Stress management education;Encouraged to exercise with the program and follow exercise prescription;Relaxation education    Comments  Counselor met with Ms. Lovena Le ([name]) for initial psychosocial evaluation.  She is a 58 year old who had a heart attack in August and unable to attend this program due to lack of insurance.  She has a good support system with a spouse; a son and (2) daughters who all live locally.  She also has quite a few good friends.  [name] has multiple health issues as well as her cardiac condition with a rotator cuff surgery in the last few years and chronic knee problems.  She reports sleeping well and has a good appetite.  She has a history of depression but no current symptoms.  [name] has multiple stressors with her spouse being in the [place] in [place] with serious health problems; her own living situation being "homeless" recently due to the house renting being sold; her health and finances/insurance issues.  She has goals for this program to increase her stamina and strength for her mental health and her health.  Counselor provided [name] a list of community  resources for homelessness and possible assistance during this time.  Counselor and staff will follow with her.     Expected Outcomes  Short:  [name] will obtain housing to help her life settle down and reduce stress.  She will exercise for her health and mental health.  She will attend and participate in the psychoeducational components of this program on stress and anxiety and learn positive  coping strategies to manage her stress better.  Long:  [name] will find a way to meet her housing and financial needs while caring for herself and others in positive ways.     Continue Psychosocial Services   Follow up required by counselor       Psychosocial Re-Evaluation:   Psychosocial Discharge (Final Psychosocial Re-Evaluation):   Vocational Rehabilitation: Provide vocational rehab assistance to qualifying candidates.   Vocational Rehab Evaluation & Intervention: Vocational Rehab - 07/05/18 1255      Initial Vocational Rehab Evaluation & Intervention   Assessment shows need for Vocational Rehabilitation  No      Vocational Rehab Re-Evaulation   Comments  Elliett wants to wait until she gets housing before she thinks about Voc Rehab.       Education: Education Goals: Education classes will be provided on a variety of topics geared toward better understanding of heart health and risk factor modification. Participant will state understanding/return demonstration of topics presented as noted by education test scores.  Learning Barriers/Preferences: Learning Barriers/Preferences - 07/05/18 1253      Learning Barriers/Preferences   Learning Barriers  Sight    Learning Preferences  Verbal Instruction;Written Material       Education Topics:  AED/CPR: - Group verbal and written instruction with the use of models to demonstrate the basic use of the AED with the basic ABC's of resuscitation.   General Nutrition Guidelines/Fats and Fiber: -Group instruction provided by verbal, written material,  models and posters to present the general guidelines for heart healthy nutrition. Gives an explanation and review of dietary fats and fiber.   Cardiac Rehab from 07/27/2018 in [facility] Cardiac and Pulmonary Rehab  Date  07/13/18  Educator  LB  Instruction Review Code  1- Verbalizes Understanding      Controlling Sodium/Reading Food Labels: -Group verbal and written material supporting the discussion of sodium use in heart healthy nutrition. Review and explanation with models, verbal and written materials for utilization of the food label.   Cardiac Rehab from 07/27/2018 in [facility] Cardiac and Pulmonary Rehab  Date  07/15/18  Educator  LB  Instruction Review Code  1- Verbalizes Understanding      Exercise Physiology & General Exercise Guidelines: - Group verbal and written instruction with models to review the exercise physiology of the cardiovascular system and associated critical values. Provides general exercise guidelines with specific guidelines to those with heart or lung disease.    Cardiac Rehab from 07/27/2018 in [facility] Cardiac and Pulmonary Rehab  Date  07/20/18  Educator  [facility]  Instruction Review Code  1- Verbalizes Understanding      Aerobic Exercise & Resistance Training: - Gives group verbal and written instruction on the various components of exercise. Focuses on aerobic and resistive training programs and the benefits of this training and how to safely progress through these programs..   Cardiac Rehab from 07/27/2018 in [facility] Cardiac and Pulmonary Rehab  Date  07/22/18  Educator  [place]  Instruction Review Code  1- Verbalizes Understanding      Flexibility, Balance, Mind/Body Relaxation: Provides group verbal/written instruction on the benefits of flexibility and balance training, including mind/body exercise modes such as yoga, pilates and tai chi.  Demonstration and skill practice provided.   Cardiac Rehab from 07/27/2018 in [facility] Cardiac and Pulmonary Rehab  Date  07/27/18   Educator  AS  Instruction Review Code  1- Verbalizes Understanding      Stress and Anxiety: - Provides group verbal and written instruction  about the health risks of elevated stress and causes of high stress.  Discuss the correlation between heart/lung disease and anxiety and treatment options. Review healthy ways to manage with stress and anxiety.   Depression: - Provides group verbal and written instruction on the correlation between heart/lung disease and depressed mood, treatment options, and the stigmas associated with seeking treatment.   Anatomy & Physiology of the Heart: - Group verbal and written instruction and models provide basic cardiac anatomy and physiology, with the coronary electrical and arterial systems. Review of Valvular disease and Heart Failure   Cardiac Procedures: - Group verbal and written instruction to review commonly prescribed medications for heart disease. Reviews the medication, class of the drug, and side effects. Includes the steps to properly store meds and maintain the prescription regimen. (beta blockers and nitrates)   Cardiac Medications I: - Group verbal and written instruction to review commonly prescribed medications for heart disease. Reviews the medication, class of the drug, and side effects. Includes the steps to properly store meds and maintain the prescription regimen.   Cardiac Medications II: -Group verbal and written instruction to review commonly prescribed medications for heart disease. Reviews the medication, class of the drug, and side effects. (all other drug classes)    Go Sex-Intimacy & Heart Disease, Get SMART - Goal Setting: - Group verbal and written instruction through game format to discuss heart disease and the return to sexual intimacy. Provides group verbal and written material to discuss and apply goal setting through the application of the S.M.A.R.T. Method.   Other Matters of the Heart: - Provides group verbal,  written materials and models to describe Stable Angina and Peripheral Artery. Includes description of the disease process and treatment options available to the cardiac patient.   Exercise & Equipment Safety: - Individual verbal instruction and demonstration of equipment use and safety with use of the equipment.   Cardiac Rehab from 07/27/2018 in [facility] Cardiac and Pulmonary Rehab  Date  07/05/18  Educator  Renita Papa, RN  Instruction Review Code  1- Verbalizes Understanding      Infection Prevention: - Provides verbal and written material to individual with discussion of infection control including proper hand washing and proper equipment cleaning during exercise session.   Cardiac Rehab from 07/27/2018 in [facility] Cardiac and Pulmonary Rehab  Date  07/05/18  Educator  Renita Papa, RN  Instruction Review Code  1- Verbalizes Understanding      Falls Prevention: - Provides verbal and written material to individual with discussion of falls prevention and safety.   Cardiac Rehab from 07/27/2018 in [facility] Cardiac and Pulmonary Rehab  Date  07/05/18  Educator  Renita Papa, RN  Instruction Review Code  1- Verbalizes Understanding      Diabetes: - Individual verbal and written instruction to review signs/symptoms of diabetes, desired ranges of glucose level fasting, after meals and with exercise. Acknowledge that pre and post exercise glucose checks will be done for 3 sessions at entry of program.   Know Your Numbers and Risk Factors: -Group verbal and written instruction about important numbers in your health.  Discussion of what are risk factors and how they play a role in the disease process.  Review of Cholesterol, Blood Pressure, Diabetes, and BMI and the role they play in your overall health.   Sleep Hygiene: -Provides group verbal and written instruction about how sleep can affect your health.  Define sleep hygiene, discuss sleep cycles and impact of sleep habits. Review good  sleep  hygiene tips.    Other: -Provides group and verbal instruction on various topics (see comments)   Knowledge Questionnaire Score: Knowledge Questionnaire Score - 07/05/18 1254      Knowledge Questionnaire Score   Pre Score  20       Core Components/Risk Factors/Patient Goals at Admission: Personal Goals and Risk Factors at Admission - 07/05/18 1256      Core Components/Risk Factors/Patient Goals on Admission    Weight Management  Yes;Obesity    Intervention  Weight Management: Develop a combined nutrition and exercise program designed to reach desired caloric intake, while maintaining appropriate intake of nutrient and fiber, sodium and fats, and appropriate energy expenditure required for the weight goal.    Admit Weight  294 lb (133.4 kg)    Goal Weight: Short Term  290 lb (131.5 kg)    Goal Weight: Long Term  275 lb (124.7 kg)    Expected Outcomes  Short Term: Continue to assess and modify interventions until short term weight is achieved;Long Term: Adherence to nutrition and physical activity/exercise program aimed toward attainment of established weight goal;Weight Loss: Understanding of general recommendations for a balanced deficit meal plan, which promotes 1-2 lb weight loss per week and includes a negative energy balance of [phone] kcal/d;Understanding recommendations for meals to include 15-35% energy as protein, 25-35% energy from fat, 35-60% energy from carbohydrates, less than 272m of dietary cholesterol, 20-35 gm of total fiber daily    Tobacco Cessation  Yes   Shakerra said she quit smoking the day of her heart attack.   Number of packs per day  0    Expected Outcomes  Long Term: Complete abstinence from all tobacco products for at least 12 months from quit date.    Diabetes  Yes    Intervention  Provide education about signs/symptoms and action to take for hypo/hyperglycemia.;Provide education about proper nutrition, including hydration, and aerobic/resistive  exercise prescription along with prescribed medications to achieve blood glucose in normal ranges: Fasting glucose 65-99 mg/dL    Expected Outcomes  Short Term: Participant verbalizes understanding of the signs/symptoms and immediate care of hyper/hypoglycemia, proper foot care and importance of medication, aerobic/resistive exercise and nutrition plan for blood glucose control.;Long Term: Attainment of HbA1C < 7%.    Stress  Yes    Intervention  Offer individual and/or small group education and counseling on adjustment to heart disease, stress management and health-related lifestyle change. Teach and support self-help strategies.;Refer participants experiencing significant psychosocial distress to appropriate mental health specialists for further evaluation and treatment. When possible, include family members and significant others in education/counseling sessions.    Expected Outcomes  Short Term: Participant demonstrates changes in health-related behavior, relaxation and other stress management skills, ability to obtain effective social support, and compliance with psychotropic medications if prescribed.       Core Components/Risk Factors/Patient Goals Review:    Core Components/Risk Factors/Patient Goals at Discharge (Final Review):    ITP Comments: ITP Comments    Row Name 07/05/18 1420 07/28/18 1354         ITP Comments  Med review completed. Initial ITP created. [facility]  30 day review completed. ITP sent to Dr. MEmily Filbert Medical Director of Cardiac Rehab. Continue with ITP unless changes are made by physician.         Comments: 30 day review

## 2018-07-29 DIAGNOSIS — Z955 Presence of coronary angioplasty implant and graft: Secondary | ICD-10-CM

## 2018-07-29 DIAGNOSIS — I214 Non-ST elevation (NSTEMI) myocardial infarction: Secondary | ICD-10-CM

## 2018-07-29 DIAGNOSIS — I252 Old myocardial infarction: Secondary | ICD-10-CM | POA: Diagnosis not present

## 2018-07-29 NOTE — Progress Notes (Signed)
Daily Session Note  Patient Details  Name: Karen Potts MRN: [id number] Date of Birth: Sep 15, 1960 Referring Provider:     Cardiac Rehab from 07/05/2018 in [facility] Cardiac and Pulmonary Rehab  Referring Provider  Isaias Cowman MD      Encounter Date: 07/29/2018  Check In: Session Check In - 07/29/18 0926      Check-In   Supervising physician immediately available to respond to emergencies  See telemetry face sheet for immediately available ER MD    Location  [facility]-Cardiac & Pulmonary Rehab    Staff Present  Gerlene Burdock, RN, BSN;Kemp Gomes BS, Exercise Physiologist;Jessica [place], MA, RCEP, CCRP, Exercise Physiologist    Medication changes reported      No    Fall or balance concerns reported     No    Tobacco Cessation  No Change    Warm-up and Cool-down  Performed as group-led instruction    Resistance Training Performed  Yes    VAD Patient?  No    PAD/SET Patient?  No      Pain Assessment   Currently in Pain?  No/denies    Multiple Pain Sites  No          Social History   Tobacco Use  Smoking Status Former Smoker  . Types: Cigarettes  . Last attempt to quit: 02/24/2018  . Years since quitting: 0.4  Smokeless Tobacco Never Used  Tobacco Comment   Amonda said she quit smoking the day of her heart attack.    Goals Met:  Independence with exercise equipment Exercise tolerated well No report of cardiac concerns or symptoms Strength training completed today  Goals Unmet:  Not Applicable  Comments: Pt able to follow exercise prescription today without complaint.  Will continue to monitor for progression.    Dr. Emily Filbert is Medical Director for [place] and [facility].

## 2018-08-03 ENCOUNTER — Encounter: Payer: Medicaid Other | Attending: Cardiology

## 2018-08-03 DIAGNOSIS — I214 Non-ST elevation (NSTEMI) myocardial infarction: Secondary | ICD-10-CM

## 2018-08-03 DIAGNOSIS — Z7902 Long term (current) use of antithrombotics/antiplatelets: Secondary | ICD-10-CM | POA: Insufficient documentation

## 2018-08-03 DIAGNOSIS — I10 Essential (primary) hypertension: Secondary | ICD-10-CM | POA: Insufficient documentation

## 2018-08-03 DIAGNOSIS — Z955 Presence of coronary angioplasty implant and graft: Secondary | ICD-10-CM | POA: Diagnosis not present

## 2018-08-03 DIAGNOSIS — Z87891 Personal history of nicotine dependence: Secondary | ICD-10-CM | POA: Diagnosis not present

## 2018-08-03 DIAGNOSIS — G4730 Sleep apnea, unspecified: Secondary | ICD-10-CM | POA: Insufficient documentation

## 2018-08-03 DIAGNOSIS — E119 Type 2 diabetes mellitus without complications: Secondary | ICD-10-CM | POA: Insufficient documentation

## 2018-08-03 DIAGNOSIS — Z79899 Other long term (current) drug therapy: Secondary | ICD-10-CM | POA: Insufficient documentation

## 2018-08-03 DIAGNOSIS — Z7984 Long term (current) use of oral hypoglycemic drugs: Secondary | ICD-10-CM | POA: Diagnosis not present

## 2018-08-03 DIAGNOSIS — I252 Old myocardial infarction: Secondary | ICD-10-CM | POA: Diagnosis not present

## 2018-08-03 NOTE — Progress Notes (Signed)
Daily Session Note  Patient Details  Name: Karen Potts MRN: [id number] Date of Birth: [birth year] Referring Provider:     Cardiac Rehab from 07/05/2018 in [facility] Cardiac and Pulmonary Rehab  Referring Provider  Karen Cowman MD      Encounter Date: 08/03/2018  Check In: Session Check In - 08/03/18 0929      Check-In   Supervising physician immediately available to respond to emergencies  See telemetry face sheet for immediately available ER MD    Location  [facility]-Cardiac & Pulmonary Rehab    Staff Present  Heath Lark, RN, BSN, CCRP;Jeanna Durrell BS, Exercise Physiologist;Jessica [place], MA, RCEP, CCRP, Exercise Physiologist    Medication changes reported      No    Fall or balance concerns reported     No    Tobacco Cessation  No Change    Warm-up and Cool-down  Performed as group-led instruction    Resistance Training Performed  Yes    VAD Patient?  No    PAD/SET Patient?  No      Pain Assessment   Currently in Pain?  No/denies    Multiple Pain Sites  No          Social History   Tobacco Use  Smoking Status Former Smoker  . Types: Cigarettes  . Last attempt to quit: 02/24/2018  . Years since quitting: 0.4  Smokeless Tobacco Never Used  Tobacco Comment   Karen Potts said she quit smoking the day of her heart attack.    Goals Met:  Independence with exercise equipment Exercise tolerated well No report of cardiac concerns or symptoms Strength training completed today  Goals Unmet:  Not Applicable  Comments: Pt able to follow exercise prescription today without complaint.  Will continue to monitor for progression.    Dr. Emily Potts is Medical Director for [place] and [facility].

## 2018-08-10 DIAGNOSIS — I252 Old myocardial infarction: Secondary | ICD-10-CM | POA: Diagnosis not present

## 2018-08-10 DIAGNOSIS — Z955 Presence of coronary angioplasty implant and graft: Secondary | ICD-10-CM

## 2018-08-10 DIAGNOSIS — I214 Non-ST elevation (NSTEMI) myocardial infarction: Secondary | ICD-10-CM

## 2018-08-10 NOTE — Progress Notes (Signed)
Daily Session Note  Patient Details  Name: Karen Potts MRN: [id number] Date of Birth: 03-Mar-1961 Referring Provider:     Cardiac Rehab from 07/05/2018 in [facility] Cardiac and Pulmonary Rehab  Referring Provider  Karen Cowman MD      Encounter Date: 08/10/2018  Check In: Session Check In - 08/10/18 0913      Check-In   Supervising physician immediately available to respond to emergencies  See telemetry face sheet for immediately available ER MD    Location  [facility]-Cardiac & Pulmonary Rehab    Staff Present  Karen Lark, RN, BSN, CCRP;Karen Potts BS, Exercise Physiologist;Karen [place], MA, RCEP, CCRP, Exercise Physiologist    Medication changes reported      No    Fall or balance concerns reported     No    Tobacco Cessation  No Change    Warm-up and Cool-down  Performed as group-led instruction    Resistance Training Performed  Yes    VAD Patient?  No    PAD/SET Patient?  No      Pain Assessment   Currently in Pain?  No/denies    Multiple Pain Sites  No          Social History   Tobacco Use  Smoking Status Former Smoker  . Types: Cigarettes  . Last attempt to quit: 02/24/2018  . Years since quitting: 0.4  Smokeless Tobacco Never Used  Tobacco Comment   Karen Potts said she quit smoking the day of her heart attack.    Goals Met:  Independence with exercise equipment Exercise tolerated well No report of cardiac concerns or symptoms Strength training completed today  Goals Unmet:  Not Applicable  Comments: Pt able to follow exercise prescription today without complaint.  Will continue to monitor for progression.    Dr. Emily Potts is Medical Director for [place] and [facility].

## 2018-08-12 DIAGNOSIS — I252 Old myocardial infarction: Secondary | ICD-10-CM | POA: Diagnosis not present

## 2018-08-12 DIAGNOSIS — I214 Non-ST elevation (NSTEMI) myocardial infarction: Secondary | ICD-10-CM

## 2018-08-12 DIAGNOSIS — Z955 Presence of coronary angioplasty implant and graft: Secondary | ICD-10-CM

## 2018-08-12 NOTE — Progress Notes (Signed)
Daily Session Note  Patient Details  Name: Karen Potts MRN: [id number] Date of Birth: 03/18/1961 Referring Provider:     Cardiac Rehab from 07/05/2018 in [facility] Cardiac and Pulmonary Rehab  Referring Provider  Isaias Cowman MD      Encounter Date: 08/12/2018  Check In: Session Check In - 08/12/18 0920      Check-In   Supervising physician immediately available to respond to emergencies  See telemetry face sheet for immediately available ER MD    Location  [facility]-Cardiac & Pulmonary Rehab    Staff Present  Gerlene Burdock, RN, BSN;Jeanna Durrell BS, Exercise Physiologist;Jessica [place], MA, RCEP, CCRP, Exercise Physiologist    Medication changes reported      No    Fall or balance concerns reported     No    Tobacco Cessation  No Change    Warm-up and Cool-down  Performed as group-led instruction    Resistance Training Performed  Yes    VAD Patient?  No    PAD/SET Patient?  No      Pain Assessment   Currently in Pain?  No/denies    Multiple Pain Sites  No          Social History   Tobacco Use  Smoking Status Former Smoker  . Types: Cigarettes  . Last attempt to quit: 02/24/2018  . Years since quitting: 0.4  Smokeless Tobacco Never Used  Tobacco Comment   Savayah said she quit smoking the day of her heart attack.    Goals Met:  Independence with exercise equipment Exercise tolerated well No report of cardiac concerns or symptoms Strength training completed today  Goals Unmet:  Not Applicable  Comments: Pt able to follow exercise prescription today without complaint.  Will continue to monitor for progression.    Dr. Emily Filbert is Medical Director for [place] and [facility].

## 2018-08-17 DIAGNOSIS — Z955 Presence of coronary angioplasty implant and graft: Secondary | ICD-10-CM

## 2018-08-17 DIAGNOSIS — I214 Non-ST elevation (NSTEMI) myocardial infarction: Secondary | ICD-10-CM

## 2018-08-17 DIAGNOSIS — I252 Old myocardial infarction: Secondary | ICD-10-CM | POA: Diagnosis not present

## 2018-08-17 NOTE — Progress Notes (Signed)
Daily Session Note  Patient Details  Name: Karen Potts MRN: [id number] Date of Birth: August 14, 1960 Referring Provider:     Cardiac Rehab from 07/05/2018 in [facility] Cardiac and Pulmonary Rehab  Referring Provider  Isaias Cowman MD      Encounter Date: 08/17/2018  Check In: Session Check In - 08/17/18 1106      Check-In   Supervising physician immediately available to respond to emergencies  See telemetry face sheet for immediately available ER MD    Location  [facility]-Cardiac & Pulmonary Rehab    Staff Present  Heath Lark, RN, BSN, CCRP;Maham Quintin BS, Exercise Physiologist;Amanda Sommer, BA, ACSM CEP, Exercise Physiologist    Medication changes reported      No    Fall or balance concerns reported     No    Tobacco Cessation  No Change    Warm-up and Cool-down  Performed as group-led instruction    Resistance Training Performed  Yes    VAD Patient?  No    PAD/SET Patient?  No      Pain Assessment   Currently in Pain?  No/denies    Multiple Pain Sites  No          Social History   Tobacco Use  Smoking Status Former Smoker  . Types: Cigarettes  . Last attempt to quit: 02/24/2018  . Years since quitting: 0.4  Smokeless Tobacco Never Used  Tobacco Comment   [name] said she quit smoking the day of her heart attack.    Goals Met:  Independence with exercise equipment Exercise tolerated well No report of cardiac concerns or symptoms Strength training completed today  Goals Unmet:  Not Applicable  Comments: Pt able to follow exercise prescription today without complaint.  Will continue to monitor for progression.    Dr. Emily Filbert is Medical Director for [place] and [facility].

## 2018-08-19 DIAGNOSIS — Z955 Presence of coronary angioplasty implant and graft: Secondary | ICD-10-CM

## 2018-08-19 DIAGNOSIS — I214 Non-ST elevation (NSTEMI) myocardial infarction: Secondary | ICD-10-CM

## 2018-08-19 NOTE — Progress Notes (Signed)
Daily Session Note  Patient Details  Name: Karen Potts MRN: [id number] Date of Birth: 10-07-1960 Referring Provider:     Cardiac Rehab from 07/05/2018 in [facility] Cardiac and Pulmonary Rehab  Referring Provider  Karen Cowman MD      Encounter Date: 08/19/2018  Check In: Session Check In - 08/19/18 0920      Check-In   Supervising physician immediately available to respond to emergencies  See telemetry face sheet for immediately available ER MD    Location  [facility]-Cardiac & Pulmonary Rehab    Staff Present  Gerlene Burdock, RN, BSN;Jeanna Durrell BS, Exercise Physiologist;Jessica [place], MA, RCEP, CCRP, Exercise Physiologist    Medication changes reported      No    Fall or balance concerns reported     No    Tobacco Cessation  No Change    Warm-up and Cool-down  Performed as group-led instruction    Resistance Training Performed  Yes    VAD Patient?  No    PAD/SET Patient?  No      Pain Assessment   Currently in Pain?  No/denies          Social History   Tobacco Use  Smoking Status Former Smoker  . Types: Cigarettes  . Last attempt to quit: 02/24/2018  . Years since quitting: 0.4  Smokeless Tobacco Never Used  Tobacco Comment   [name] said she quit smoking the day of her heart attack.    Goals Met:  Independence with exercise equipment Exercise tolerated well No report of cardiac concerns or symptoms Strength training completed today  Goals Unmet:  Not Applicable  Comments: Pt able to follow exercise prescription today without complaint.  Will continue to monitor for progression.    Dr. Emily Potts is Medical Director for [facility] and [facility].

## 2018-08-24 ENCOUNTER — Telehealth: Payer: Self-pay

## 2018-08-24 NOTE — Telephone Encounter (Signed)
Karen Potts has a Dr appt about her knee and cant attend HT today

## 2018-08-25 ENCOUNTER — Encounter: Payer: Self-pay | Admitting: *Deleted

## 2018-08-25 DIAGNOSIS — I214 Non-ST elevation (NSTEMI) myocardial infarction: Secondary | ICD-10-CM

## 2018-08-25 DIAGNOSIS — Z955 Presence of coronary angioplasty implant and graft: Secondary | ICD-10-CM

## 2018-08-25 NOTE — Progress Notes (Signed)
Cardiac Individual Treatment Plan  Patient Details  Name: Karen Potts MRN: [id number] Date of Birth: 18-Sep-1960 Referring Provider:     Cardiac Rehab from 07/05/2018 in [facility] Cardiac and Pulmonary Rehab  Referring Provider  Isaias Cowman MD      Initial Encounter Date:    Cardiac Rehab from 07/05/2018 in [facility] Cardiac and Pulmonary Rehab  Date  07/05/18      Visit Diagnosis: NSTEMI (non-ST elevated myocardial infarction) [facility])  Status post coronary artery stent placement  Patient's Home Medications on Admission:  Current Outpatient Medications:  .  atorvastatin (LIPITOR) 80 MG tablet, Take 1 tablet (80 mg total) by mouth daily at 6 PM. (Patient not taking: Reported on 07/05/2018), Disp: 30 tablet, Rfl: 0 .  cyclobenzaprine (FLEXERIL) 10 MG tablet, cyclobenzaprine 10 mg tablet  Take 1 tablet every 8 hours by oral route for 3 days., Disp: , Rfl:  .  lisinopril-hydrochlorothiazide (PRINZIDE,ZESTORETIC) 10-12.5 MG tablet, TAKE 1 TABLET BY MOUTH ONCE DAILY, Disp: 90 tablet, Rfl: 1 .  loratadine (CLARITIN) 10 MG tablet, Take by mouth., Disp: , Rfl:  .  meclizine (ANTIVERT) 12.5 MG tablet, Take 1 tablet (12.5 mg total) by mouth 3 (three) times daily as needed for dizziness or nausea. (Patient not taking: Reported on 07/05/2018), Disp: 30 tablet, Rfl: 1 .  meclizine (ANTIVERT) 25 MG tablet, Take 1 tablet (25 mg total) by mouth 3 (three) times daily as needed for dizziness., Disp: 30 tablet, Rfl: 0 .  meloxicam (MOBIC) 15 MG tablet, meloxicam 15 mg tablet, Disp: , Rfl:  .  metFORMIN (GLUCOPHAGE) 500 MG tablet, Take 500 mg by mouth 2 (two) times daily with a meal., Disp: , Rfl:  .  metFORMIN (GLUCOPHAGE) 500 MG tablet, Take by mouth., Disp: , Rfl:  .  nitroGLYCERIN (NITROSTAT) 0.4 MG SL tablet, Place 1 tablet (0.4 mg total) under the tongue every 5 (five) minutes as needed for chest pain., Disp: 30 tablet, Rfl: 12 .  prasugrel (EFFIENT) 10 MG TABS tablet, Take by mouth., Disp: , Rfl:    Past Medical History: Past Medical History:  Diagnosis Date  . Diabetes mellitus without complication ([place])   . Hypertension   . Sleep apnea   . Vertigo     Tobacco Use: Social History   Tobacco Use  Smoking Status Former Smoker  . Types: Cigarettes  . Last attempt to quit: 02/24/2018  . Years since quitting: 0.4  Smokeless Tobacco Never Used  Tobacco Comment   Arabelle said she quit smoking the day of her heart attack.    Labs: Recent Review Flowsheet Data    Labs for ITP Cardiac and Pulmonary Rehab Latest Ref Rng & Units 02/24/2018   Cholestrol 0 - 200 mg/dL 228(H)   LDLCALC 0 - 99 mg/dL 165(H)   HDL >40 mg/dL 36(L)   Trlycerides <150 mg/dL 134   Hemoglobin A1c 4.8 - 5.6 % 6.4(H)       Exercise Target Goals: Exercise Program Goal: Individual exercise prescription set using results from initial 6 min walk test and THRR while considering  patient's activity barriers and safety.   Exercise Prescription Goal: Initial exercise prescription builds to 30-45 minutes a day of aerobic activity, 2-3 days per week.  Home exercise guidelines will be given to patient during program as part of exercise prescription that the participant will acknowledge.  Activity Barriers & Risk Stratification: Activity Barriers & Cardiac Risk Stratification - 07/05/18 1413      Activity Barriers & Cardiac Risk Stratification  Activity Barriers  Balance Concerns;Deconditioning;Muscular Weakness;Shortness of Breath;Joint Problems   vertigo, rotator cuff repair 07/2017, hip pain   Cardiac Risk Stratification  Moderate       6 Minute Walk: 6 Minute Walk    Row Name 07/05/18 1410         6 Minute Walk   Phase  Initial     Distance  1020 feet     Walk Time  6 minutes     # of Rest Breaks  0     MPH  1.93     METS  3     RPE  17     Perceived Dyspnea   3     VO2 Peak  10.53     Symptoms  Yes (comment)     Comments  hip pain 8/10; side stitch 4/10     Resting HR  73 bpm     Resting  BP  142/74     Resting Oxygen Saturation   97 %     Exercise Oxygen Saturation  during 6 min walk  99 %     Max Ex. HR  145 bpm     Max Ex. BP  176/84     2 Minute Post BP  172/80 140/80        Oxygen Initial Assessment:   Oxygen Re-Evaluation:   Oxygen Discharge (Final Oxygen Re-Evaluation):   Initial Exercise Prescription: Initial Exercise Prescription - 07/05/18 1400      Date of Initial Exercise RX and Referring Provider   Date  07/05/18    Referring Provider  Paraschos, Alexander MD      Treadmill   MPH  1.9    Grade  0.5    Minutes  15    METs  2.59      Recumbant Bike   Level  3    RPM  50    Watts  42    Minutes  15    METs  2.5      NuStep   Level  3    SPM  80    Minutes  15    METs  2.5      Prescription Details   Frequency (times per week)  2    Duration  Progress to 30 minutes of continuous aerobic without signs/symptoms of physical distress      Intensity   THRR 40-80% of Max Heartrate  109-144    Ratings of Perceived Exertion  11-13    Perceived Dyspnea  0-4      Progression   Progression  Continue to progress workloads to maintain intensity without signs/symptoms of physical distress.      Resistance Training   Training Prescription  Yes    Weight  3 lbs    Reps  10-15       Perform Capillary Blood Glucose checks as needed.  Exercise Prescription Changes: Exercise Prescription Changes    Row Name 07/05/18 1400 07/20/18 1100 08/03/18 1400 08/18/18 1600       Response to Exercise   Blood Pressure (Admit)  142/74  130/62  134/70  142/78    Blood Pressure (Exercise)  176/84  132/74  132/76  164/74    Blood Pressure (Exit)  172/80 rck 142/74  128/62  112/58  140/70    Heart Rate (Admit)  73 bpm  72 bpm  79 bpm  69 bpm    Heart Rate (Exercise)  145 bpm  91 bpm  94  bpm  113 bpm    Heart Rate (Exit)  75 bpm  67 bpm  65 bpm  63 bpm    Oxygen Saturation (Admit)  97 %  -  -  -    Oxygen Saturation (Exercise)  99 %  -  -  -    Rating  of Perceived Exertion (Exercise)  _0 Perceived Dyspnea (Exercise)  3  -  -  -    Symptoms  SOB, hip pain 8/10; side stitch 4/10  none  none  none    Comments  walk test results  -  -  -    Duration  -  Continue with 30 min of aerobic exercise without signs/symptoms of physical distress.  Continue with 30 min of aerobic exercise without signs/symptoms of physical distress.  Continue with 30 min of aerobic exercise without signs/symptoms of physical distress.    Intensity  -  THRR unchanged  THRR unchanged  THRR unchanged      Progression   Progression  -  Continue to progress workloads to maintain intensity without signs/symptoms of physical distress.  Continue to progress workloads to maintain intensity without signs/symptoms of physical distress.  Continue to progress workloads to maintain intensity without signs/symptoms of physical distress.    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  Add 3 additional days to program exercise sessions.  Add 3 additional days to program exercise sessions.    Initial Home Exercises Provided  -  07/20/18  07/20/18  07/20/18       Exercise Comments: Exercise  Comments    Row Name 07/08/18 [phone]           Exercise Comments   First full day of exercise!  Patient was oriented to gym and equipment including functions, settings, policies, and procedures.  Patient's individual exercise prescription and treatment plan were reviewed.  All starting workloads were established based on the results of  the 6 minute walk test done at initial orientation visit.  The plan for exercise progression was also introduced and progression will be customized based on patient's performance and goals.          Exercise Goals and Review: Exercise Goals    Row Name 07/05/18 1419             Exercise Goals   Increase Physical Activity  Yes       Intervention  Provide advice, education, support and counseling about physical activity/exercise needs.;Develop an individualized exercise prescription for aerobic and resistive training based on initial evaluation findings, risk stratification, comorbidities and participant's personal goals.       Expected Outcomes  Short Term: Attend rehab on a regular basis to increase amount of physical activity.;Long Term: Add in home exercise to make exercise part of routine and to increase amount of physical activity.;Long Term: Exercising regularly at least 3-5 days a week.       Increase Strength and Stamina  Yes       Intervention  Provide advice, education, support and counseling about physical activity/exercise needs.;Develop an individualized exercise prescription for aerobic and resistive training based on initial evaluation findings, risk stratification, comorbidities and participant's personal goals.       Expected Outcomes  Short Term: Increase workloads from initial exercise prescription for resistance, speed, and METs.;Short Term: Perform resistance training exercises routinely during rehab and add in resistance training at home;Long Term: Improve cardiorespiratory fitness, muscular endurance and strength as measured by increased  METs and functional capacity (6MWT)       Able to understand and use rate of perceived exertion (RPE) scale  Yes       Intervention  Provide education and explanation on how to use RPE scale       Expected Outcomes  Short Term: Able to use RPE daily in rehab to express subjective intensity level;Long Term:  Able to use RPE to guide intensity level when exercising independently       Able to understand and use Dyspnea scale  Yes       Intervention  Provide education and explanation on how to use Dyspnea scale       Expected Outcomes  Short Term: Able to use Dyspnea scale daily in rehab to express subjective sense of shortness of breath during exertion;Long Term: Able to use Dyspnea scale to guide intensity level when exercising independently       Knowledge and understanding of Target Heart Rate Range (THRR)  Yes       Intervention  Provide education and explanation of THRR including how the numbers were predicted and where they are located for reference       Expected Outcomes  Short Term: Able to state/look up THRR;Short Term: Able to use daily as guideline for intensity in rehab;Long Term: Able to use THRR to govern intensity when exercising independently       Able to check pulse independently  Yes       Intervention  Provide education and demonstration on how to check pulse in carotid and radial arteries.;Review the importance of being able to check your own pulse for safety during independent exercise       Expected Outcomes  Short Term: Able to explain why pulse checking is important during independent exercise;Long Term: Able to check pulse independently and accurately       Understanding of Exercise Prescription  Yes       Intervention  Provide education,  explanation, and written materials on patient's individual exercise prescription       Expected Outcomes  Short Term: Able to explain program exercise prescription;Long Term: Able to explain home exercise prescription to exercise  independently          Exercise Goals Re-Evaluation : Exercise Goals Re-Evaluation    Row Name 07/08/18 0929 07/20/18 1126 08/03/18 1023 08/18/18 1606       Exercise Goal Re-Evaluation   Exercise Goals Review  Increase Physical Activity;Able to understand and use rate of perceived exertion (RPE) scale;Knowledge and understanding of Target Heart Rate Range (THRR);Understanding of Exercise Prescription;Increase Strength and Stamina  Increase Physical Activity;Increase Strength and Stamina;Able to understand and use rate of perceived exertion (RPE) scale;Knowledge and understanding of Target Heart Rate Range (THRR);Able to check pulse independently;Understanding of Exercise Prescription  Increase Physical Activity;Increase Strength and Stamina  Increase Physical Activity;Increase Strength and Stamina;Understanding of Exercise Prescription    Comments  Reviewed RPE scale, THR and program prescription with pt today.  Pt voiced understanding and was given a copy of goals to take home.   Reviewed home exercise with pt today.  Pt plans to walk or join [facility] for exercise.  Reviewed THR, pulse, RPE, sign and symptoms, NTG use, and when to call 911 or MD.  Also discussed weather considerations and indoor options.  She is also interested in getting help with purchasing new shoes.  We will work with the foundation to help her.   [name] has not been able to find out about the [employer]. She plans on joining when she is done with the program. Patient states that she walks everyday even though it may not be much.  [name] continues to do well in rehab.  She is up to 1.0 mph on the treadmill now!  We will continue to monitor her progress.     Expected Outcomes  Short: Use RPE daily to regulate intensity. Long: Follow program prescription in THR.  Short: Talk to [employer] about using husband's membership  Long: Exercise more at home  Short: join a gym after [employer]. Long: maintain exercise post [employer] independently.   Short: Continue to increase workloads.  Long: Ask about [employer] for outside class exercise.        Discharge Exercise Prescription (Final Exercise Prescription Changes): Exercise Prescription Changes - 08/18/18 1600      Response to Exercise   Blood Pressure (Admit)  142/78    Blood Pressure (Exercise)  164/74    Blood Pressure (Exit)  140/70    Heart Rate (Admit)  69 bpm    Heart Rate (Exercise)  113 bpm    Heart Rate (Exit)  63 bpm    Rating of Perceived Exertion (Exercise)  17    Symptoms  none    Duration  Continue with 30 min of aerobic exercise without signs/symptoms of physical distress.    Intensity  THRR unchanged      Progression   Progression  Continue to progress workloads to maintain intensity without signs/symptoms of physical distress.    Average METs  2.07      Resistance Training   Training Prescription  Yes    Weight  3 lbs    Reps  10-15      Interval Training   Interval Training  No      Treadmill   MPH  1    Grade  0.5    Minutes  15    METs  1.83  NuStep   Level  4    Minutes  15    METs  2.2      Arm Ergometer   Level  1    Minutes  15    METs  2.2      Home Exercise Plan   Plans to continue exercise at  [employer] (comment)   [employer] under [employer], walking   Frequency  Add 3 additional days to program exercise sessions.    Initial Home Exercises Provided  07/20/18       Nutrition:  Target Goals: Understanding of nutrition guidelines, daily intake of sodium <1547m, cholesterol <2044m calories 30% from fat and 7% or less from saturated fats, daily to have 5 or more servings of fruits and vegetables.  Biometrics: Pre Biometrics - 07/05/18 1420      Pre Biometrics   Height  5' 8.5" (1.74 m)    Weight  294 lb (133.4 kg)    Waist Circumference  46.5 inches    Hip Circumference  59.5 inches    Waist to Hip Ratio  0.78 %    BMI (Calculated)  44.05    Single Leg Stand  11.38 seconds        Nutrition Therapy Plan  and Nutrition Goals: Nutrition Therapy & Goals - 07/08/18 1104      Nutrition Therapy   Diet  DM    Drug/Food Interactions  Statins/Certain Fruits    Protein (specify units)  11oz    Fiber  20 grams    Whole Grain Foods  3 servings   recently switched from white to white-wheat bread   Saturated Fats  13 max. grams    Fruits and Vegetables  4 servings/day   8 ideal; eats 2 meals/day but tries to include soft fruits and vegetables   Sodium  1500 grams      Personal Nutrition Goals   Nutrition Goal  Include sources of protein and fiber as part of your regular, daily diet to better manage BG levels    Personal Goal #2  Continue to decrease sugar concentration of soda by watering it down, and work on decreasing soda / sugar sweetened beverage intake in general    Personal Goal #3  Great job for switching from white to white-wheat bread. Work your way to choosing 100% whole wheat bread, especially since you eat bread so often    Comments  Pt and her husband are both diabetics. She had all of her teeth pulled last year and is waiting on Medicare to approve her for dentures. Wt loss of 10-15# reported d/t not being able to eat all of the foods she used to with teeth and eating less overall. She is happier at this weight. She has recently applied for Food Stamps d/t change in both job and living situations. She eats soft fruits (whole or fruit cup) and vegetables. She rinses off canned vegetables before consuming. Does not eat fried foods often d/t difficulty chewing. She has been trialing yogurt as a snack option and typically eats 2 meals/day. Brunch: cereal, sausage egg biscuit from [employer], eggs, bread. Dinner: salad, vegetables, hamburger, fish, potatoes, bread, peanut butter and jelly sandwich. Beverages: water (trying to drink more), 2% milk, orange juice, soda. Explains she drinks OJ and milk together at brunch and has been watering down her soda d/t disliking diet soda. She does not add extra  salt to anything she eats      Intervention Plan   Intervention  Prescribe,  educate and counsel regarding individualized specific dietary modifications aiming towards targeted core components such as weight, hypertension, lipid management, diabetes, heart failure and other comorbidities.;Nutrition handout(s) given to patient.   General meal planning guidelines for DM type II   Expected Outcomes  Short Term Goal: A plan has been developed with personal nutrition goals set during dietitian appointment.;Short Term Goal: Understand basic principles of dietary content, such as calories, fat, sodium, cholesterol and nutrients.;Long Term Goal: Adherence to prescribed nutrition plan.       Nutrition Assessments: Nutrition Assessments - 07/05/18 1252      MEDFICTS Scores   Pre Score  41       Nutrition Goals Re-Evaluation: Nutrition Goals Re-Evaluation    [place] Name 07/08/18 1133 08/03/18 1027           Goals   Current Weight  -  292 lb (132.5 kg)      Nutrition Goal  Great job for switching from white to white-wheat bread. Work your way to choosing 100% whole wheat bread, especially since you eat bread so often  Lose weight and eat healthier.       Comment  She eats bread with most meals and recently switched to the white-wheat variety  [name] has made a few changes in her diet. She does not eat fried food. She eats more yogurt and drinks more water. She still has a habit with drinking sodas.       Expected Outcome  She will choose 100% whole wheat bread and monitor portions of bread consumed daily in relation to other foods to encourage diet variety  Short; lose 5 pounds the next two weeks. Long: make healthier eating choices independently.        Personal Goal #2 Re-Evaluation   Personal Goal #2  Continue to decrease sugar concentration of soda by watering it down, and work on decreasing soda / sugar sweetened beverage intake in general  -        Personal Goal #3 Re-Evaluation   Personal Goal  #3  Include sources of protein and fiber as part of your regular, daily diet to better manage BG levels  -         Nutrition Goals Discharge (Final Nutrition Goals Re-Evaluation): Nutrition Goals Re-Evaluation - 08/03/18 1027      Goals   Current Weight  292 lb (132.5 kg)    Nutrition Goal  Lose weight and eat healthier.     Comment  [name] has made a few changes in her diet. She does not eat fried food. She eats more yogurt and drinks more water. She still has a habit with drinking sodas.     Expected Outcome  Short; lose 5 pounds the next two weeks. Long: make healthier eating choices independently.       Psychosocial: Target Goals: Acknowledge presence or absence of significant depression and/or stress, maximize coping skills, provide positive support system. Participant is able to verbalize types and ability to use techniques and skills needed for reducing stress and depression.   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 One month before it closed she fell at work and hurt her shoulder so got Workmen's Comp  help and unemployment. Had a heart attack then was told the place she rented was being sold and they had to move in less than a month. Currently she is living in a hotel. Her grandchildren and son moved in with his girlfriend. Her husband is in the [place] [facility] waiting for long term placement.       Family Dynamics   Good Support System?  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 One month before it closed she fell at work and hurt her shoulder so got Workmen's Comp help and unemployment which has run out. Had a  heart attack then was told the place she rented was being sold and they had to move in less than a month. Currently she is living in a hotel. Her grandchildren and son moved in with his girlfriend. Her husband is in the [place] hospital waiting for long term placement. Evalyn recently got Medicaid. She said she quit reapplying for food stamps since they based it off her husbands income so she only got $12/month. Brodi said [facility] may not help her now since "I really dont' have a place to live yet". Has filled out paperwork for [place] housing and waiting to get approved. Fatuma said she prays a lot.      Barriers   Psychosocial barriers to participate in program  The patient should benefit from training in stress management and relaxation.      Screening Interventions   Interventions  Encouraged to exercise;Program counselor consult       Quality of Life Scores:  Quality of Life - 07/05/18 1251      Quality of Life   Select  Quality of Life      Quality of Life Scores   Health/Function Pre  15 %    Socioeconomic Pre  11 %    Psych/Spiritual Pre  19.71 %    Family Pre  14.4 %    GLOBAL Pre  15.19 %      Scores of 19 and below usually indicate a poorer quality of life in these areas.  A difference of  2-3 points is a clinically meaningful difference.  A difference of 2-3 points in the total score of the Quality of Life Index has been associated with significant improvement in overall quality of life, self-image, physical symptoms, and general health in studies assessing change in quality of life.  PHQ-9: Recent Review Flowsheet Data    Depression screen [facility] 2/9 07/05/2018   Decreased Interest 0   Down, Depressed, Hopeless 1   PHQ - 2 Score 1   Altered sleeping 0   Tired, decreased energy 3   Change in appetite 2   Feeling bad or failure about yourself  1   Trouble concentrating 0   Moving slowly or fidgety/restless 0   Suicidal thoughts 0   PHQ-9 Score 7   Difficult  doing work/chores Not difficult at all     Interpretation of Total Score  Total Score Depression Severity:  1-4 = Minimal depression, 5-9 = Mild depression, 10-14 = Moderate depression, 15-19 = Moderately severe depression, 20-27 = Severe depression   Psychosocial Evaluation and Intervention: Psychosocial Evaluation - 07/15/18 1048      Psychosocial Evaluation & Interventions   Interventions  Stress management education;Encouraged to exercise with the program and follow exercise prescription;Relaxation  education    Comments  Counselor met with Ms. Lovena Le ([name]) for initial psychosocial evaluation.  She is a 58 year old who had a heart attack in August and unable to attend this program due to lack of insurance.  She has a good support system with a spouse; a son and (2) daughters who all live locally.  She also has quite a few good friends.  [name] has multiple health issues as well as her cardiac condition with a rotator cuff surgery in the last few years and chronic knee problems.  She reports sleeping well and has a good appetite.  She has a history of depression but no current symptoms.  [name] has multiple stressors with her spouse being in the [place] in [place] with serious health problems; her own living situation being "homeless" recently due to the house renting being sold; her health and finances/insurance issues.  She has goals for this program to increase her stamina and strength for her mental health and her health.  Counselor provided [name] a list of community resources for homelessness and possible assistance during this time.  Counselor and staff will follow with her.     Expected Outcomes  Short:  [name] will obtain housing to help her life settle down and reduce stress.  She will exercise for her health and mental health.  She will attend and participate in the psychoeducational components of this program on stress and anxiety and learn positive coping strategies to manage her stress better.  Long:  [name]  will find a way to meet her housing and financial needs while caring for herself and others in positive ways.     Continue Psychosocial Services   Follow up required by counselor       Psychosocial Re-Evaluation: Psychosocial Re-Evaluation    [place] Name 08/12/18 1738             Psychosocial Re-Evaluation   Current issues with  Current Stress Concerns;Current Anxiety/Panic       Comments  Counselor follow up with [name] today reporting ongoing problems with spouse who has been transferred to a LT care facility locally and is "bothering her" constantly about visiting and doing things for him.  [name] is struggling with her own health issues and was experiencing back pain today and some nausea as well.  Counselor explored with [name] ways to set limits and boundaries for improved self-care.  [name] practiced assertive statements with counselor and reported feeling empowered and less pain in her back at the end of our time together. Counselor will follow with [name] next week.  Counselor also facilitated staff providing [name] with a gas card to help with expenses since she has no income currently and is struggling financially.         Expected Outcomes  Short:  [name] will set healthy boundaries and limits with others (particularly her spouse).  [name] will exercise for her health and as a stress reducer - for her mental health.  Long:  [name] will continue to practice assertiveness with others and develop positive self-care strategies - including exercise consistently.        Continue Psychosocial Services   Follow up required by counselor          Psychosocial Discharge (Final Psychosocial Re-Evaluation): Psychosocial Re-Evaluation - 08/12/18 1738      Psychosocial Re-Evaluation   Current issues with  Current Stress Concerns;Current Anxiety/Panic    Comments  Counselor follow up with [name] today reporting ongoing problems with spouse who  has been transferred to a LT care facility locally and is "bothering her" constantly  about visiting and doing things for him.  [name] is struggling with her own health issues and was experiencing back pain today and some nausea as well.  Counselor explored with [name] ways to set limits and boundaries for improved self-care.  [name] practiced assertive statements with counselor and reported feeling empowered and less pain in her back at the end of our time together. Counselor will follow with [name] next week.  Counselor also facilitated staff providing [name] with a gas card to help with expenses since she has no income currently and is struggling financially.      Expected Outcomes  Short:  [name] will set healthy boundaries and limits with others (particularly her spouse).  [name] will exercise for her health and as a stress reducer - for her mental health.  Long:  [name] will continue to practice assertiveness with others and develop positive self-care strategies - including exercise consistently.     Continue Psychosocial Services   Follow up required by counselor       Vocational Rehabilitation: Provide vocational rehab assistance to qualifying candidates.   Vocational Rehab Evaluation & Intervention: Vocational Rehab - 07/05/18 1255      Initial Vocational Rehab Evaluation & Intervention   Assessment shows need for Vocational Rehabilitation  No      Vocational Rehab Re-Evaulation   Comments  Barbie wants to wait until she gets housing before she thinks about Voc Rehab.       Education: Education Goals: Education classes will be provided on a variety of topics geared toward better understanding of heart health and risk factor modification. Participant will state understanding/return demonstration of topics presented as noted by education test scores.  Learning Barriers/Preferences: Learning Barriers/Preferences - 07/05/18 1253      Learning Barriers/Preferences   Learning Barriers  Sight    Learning Preferences  Verbal Instruction;Written Material       Education  Topics:  AED/CPR: - Group verbal and written instruction with the use of models to demonstrate the basic use of the AED with the basic ABC's of resuscitation.   General Nutrition Guidelines/Fats and Fiber: -Group instruction provided by verbal, written material, models and posters to present the general guidelines for heart healthy nutrition. Gives an explanation and review of dietary fats and fiber.   Cardiac Rehab from 08/19/2018 in [facility] Cardiac and Pulmonary Rehab  Date  07/13/18  Educator  LB  Instruction Review Code  1- Verbalizes Understanding      Controlling Sodium/Reading Food Labels: -Group verbal and written material supporting the discussion of sodium use in heart healthy nutrition. Review and explanation with models, verbal and written materials for utilization of the food label.   Cardiac Rehab from 08/19/2018 in [facility] Cardiac and Pulmonary Rehab  Date  07/15/18  Educator  LB  Instruction Review Code  1- Verbalizes Understanding      Exercise Physiology & General Exercise Guidelines: - Group verbal and written instruction with models to review the exercise physiology of the cardiovascular system and associated critical values. Provides general exercise guidelines with specific guidelines to those with heart or lung disease.    Cardiac Rehab from 08/19/2018 in [facility] Cardiac and Pulmonary Rehab  Date  07/20/18  Educator  [facility]  Instruction Review Code  1- Verbalizes Understanding      Aerobic Exercise & Resistance Training: - Gives group verbal and written instruction on the various components of exercise. Focuses on  aerobic and resistive training programs and the benefits of this training and how to safely progress through these programs..   Cardiac Rehab from 08/19/2018 in [facility] Cardiac and Pulmonary Rehab  Date  07/22/18  Educator  [place]  Instruction Review Code  1- Verbalizes Understanding      Flexibility, Balance, Mind/Body Relaxation: Provides group verbal/written  instruction on the benefits of flexibility and balance training, including mind/body exercise modes such as yoga, pilates and tai chi.  Demonstration and skill practice provided.   Cardiac Rehab from 08/19/2018 in [facility] Cardiac and Pulmonary Rehab  Date  07/27/18  Educator  AS  Instruction Review Code  1- Verbalizes Understanding      Stress and Anxiety: - Provides group verbal and written instruction about the health risks of elevated stress and causes of high stress.  Discuss the correlation between heart/lung disease and anxiety and treatment options. Review healthy ways to manage with stress and anxiety.   Cardiac Rehab from 08/19/2018 in [facility] Cardiac and Pulmonary Rehab  Date  08/17/18  Educator  [facility]  Instruction Review Code  1- Verbalizes Understanding      Depression: - Provides group verbal and written instruction on the correlation between heart/lung disease and depressed mood, treatment options, and the stigmas associated with seeking treatment.   Anatomy & Physiology of the Heart: - Group verbal and written instruction and models provide basic cardiac anatomy and physiology, with the coronary electrical and arterial systems. Review of Valvular disease and Heart Failure   Cardiac Procedures: - Group verbal and written instruction to review commonly prescribed medications for heart disease. Reviews the medication, class of the drug, and side effects. Includes the steps to properly store meds and maintain the prescription regimen. (beta blockers and nitrates)   Cardiac Rehab from 08/19/2018 in [facility] Cardiac and Pulmonary Rehab  Date  08/19/18  Educator  CE  Instruction Review Code  1- Verbalizes Understanding      Cardiac Medications I: - Group verbal and written instruction to review commonly prescribed medications for heart disease. Reviews the medication, class of the drug, and side effects. Includes the steps to properly store meds and maintain the prescription regimen.    Cardiac Rehab from 08/19/2018 in [facility] Cardiac and Pulmonary Rehab  Date  08/10/18  Educator  SB  Instruction Review Code  1- Verbalizes Understanding      Cardiac Medications II: -Group verbal and written instruction to review commonly prescribed medications for heart disease. Reviews the medication, class of the drug, and side effects. (all other drug classes)   Cardiac Rehab from 08/19/2018 in [facility] Cardiac and Pulmonary Rehab  Date  07/29/18  Educator  CE  Instruction Review Code  1- Verbalizes Understanding       Go Sex-Intimacy & Heart Disease, Get SMART - Goal Setting: - Group verbal and written instruction through game format to discuss heart disease and the return to sexual intimacy. Provides group verbal and written material to discuss and apply goal setting through the application of the S.M.A.R.T. Method.   Cardiac Rehab from 08/19/2018 in [facility] Cardiac and Pulmonary Rehab  Date  08/19/18  Educator  CE  Instruction Review Code  1- Verbalizes Understanding      Other Matters of the Heart: - Provides group verbal, written materials and models to describe Stable Angina and Peripheral Artery. Includes description of the disease process and treatment options available to the cardiac patient.   Exercise & Equipment Safety: - Individual verbal instruction and demonstration of equipment  use and safety with use of the equipment.   Cardiac Rehab from 08/19/2018 in [facility] Cardiac and Pulmonary Rehab  Date  07/05/18  Educator  Renita Papa, RN  Instruction Review Code  1- Verbalizes Understanding      Infection Prevention: - Provides verbal and written material to individual with discussion of infection control including proper hand washing and proper equipment cleaning during exercise session.   Cardiac Rehab from 08/19/2018 in [facility] Cardiac and Pulmonary Rehab  Date  07/05/18  Educator  Renita Papa, RN  Instruction Review Code  1- Verbalizes Understanding      Falls  Prevention: - Provides verbal and written material to individual with discussion of falls prevention and safety.   Cardiac Rehab from 08/19/2018 in [facility] Cardiac and Pulmonary Rehab  Date  07/05/18  Educator  Renita Papa, RN  Instruction Review Code  1- Verbalizes Understanding      Diabetes: - Individual verbal and written instruction to review signs/symptoms of diabetes, desired ranges of glucose level fasting, after meals and with exercise. Acknowledge that pre and post exercise glucose checks will be done for 3 sessions at entry of program.   Know Your Numbers and Risk Factors: -Group verbal and written instruction about important numbers in your health.  Discussion of what are risk factors and how they play a role in the disease process.  Review of Cholesterol, Blood Pressure, Diabetes, and BMI and the role they play in your overall health.   Cardiac Rehab from 08/19/2018 in [facility] Cardiac and Pulmonary Rehab  Date  07/29/18  Educator  CE  Instruction Review Code  1- Verbalizes Understanding      Sleep Hygiene: -Provides group verbal and written instruction about how sleep can affect your health.  Define sleep hygiene, discuss sleep cycles and impact of sleep habits. Review good sleep hygiene tips.    Cardiac Rehab from 08/19/2018 in [facility] Cardiac and Pulmonary Rehab  Date  08/03/18  Educator  [facility]  Instruction Review Code  1- Verbalizes Understanding      Other: -Provides group and verbal instruction on various topics (see comments)   Knowledge Questionnaire Score: Knowledge Questionnaire Score - 07/05/18 1254      Knowledge Questionnaire Score   Pre Score  20       Core Components/Risk Factors/Patient Goals at Admission: Personal Goals and Risk Factors at Admission - 07/05/18 1256      Core Components/Risk Factors/Patient Goals on Admission    Weight Management  Yes;Obesity    Intervention  Weight Management: Develop a combined nutrition and exercise program designed  to reach desired caloric intake, while maintaining appropriate intake of nutrient and fiber, sodium and fats, and appropriate energy expenditure required for the weight goal.    Admit Weight  294 lb (133.4 kg)    Goal Weight: Short Term  290 lb (131.5 kg)    Goal Weight: Long Term  275 lb (124.7 kg)    Expected Outcomes  Short Term: Continue to assess and modify interventions until short term weight is achieved;Long Term: Adherence to nutrition and physical activity/exercise program aimed toward attainment of established weight goal;Weight Loss: Understanding of general recommendations for a balanced deficit meal plan, which promotes 1-2 lb weight loss per week and includes a negative energy balance of [phone] kcal/d;Understanding recommendations for meals to include 15-35% energy as protein, 25-35% energy from fat, 35-60% energy from carbohydrates, less than 266m of dietary cholesterol, 20-35 gm of total fiber daily    Tobacco Cessation  Yes   Khristina said she quit smoking the day of her heart attack.   Number of packs per day  0    Expected Outcomes  Long Term: Complete abstinence from all tobacco products for at least 12 months from quit date.    Diabetes  Yes    Intervention  Provide education about signs/symptoms and action to take for hypo/hyperglycemia.;Provide education about proper nutrition, including hydration, and aerobic/resistive exercise prescription along with prescribed medications to achieve blood glucose in normal ranges: Fasting glucose 65-99 mg/dL    Expected Outcomes  Short Term: Participant verbalizes understanding of the signs/symptoms and immediate care of hyper/hypoglycemia, proper foot care and importance of medication, aerobic/resistive exercise and nutrition plan for blood glucose control.;Long Term: Attainment of HbA1C < 7%.    Stress  Yes    Intervention  Offer individual and/or small group education and counseling on adjustment to heart disease, stress management and  health-related lifestyle change. Teach and support self-help strategies.;Refer participants experiencing significant psychosocial distress to appropriate mental health specialists for further evaluation and treatment. When possible, include family members and significant others in education/counseling sessions.    Expected Outcomes  Short Term: Participant demonstrates changes in health-related behavior, relaxation and other stress management skills, ability to obtain effective social support, and compliance with psychotropic medications if prescribed.       Core Components/Risk Factors/Patient Goals Review:  Goals and Risk Factor Review    Row Name 08/03/18 1031             Core Components/Risk Factors/Patient Goals Review   Personal Goals Review  Weight Management/Obesity;Diabetes;Hypertension;Tobacco Cessation       Review  [name] is trying to quit smoking. She may smoke a cigarrette one every couple days. Her husband gets on her nerves and sometimes she will have one. Her heart attack was the main factor that has helped to start quitting. Explained to patient certain ways to quit smoking and triggers to stay away from.        Expected Outcomes  Short: go a whole week without smoking. Long: quit smoking for good.           Core Components/Risk Factors/Patient Goals at Discharge (Final Review):  Goals and Risk Factor Review - 08/03/18 1031      Core Components/Risk Factors/Patient Goals Review   Personal Goals Review  Weight Management/Obesity;Diabetes;Hypertension;Tobacco Cessation    Review  [name] is trying to quit smoking. She may smoke a cigarrette one every couple days. Her husband gets on her nerves and sometimes she will have one. Her heart attack was the main factor that has helped to start quitting. Explained to patient certain ways to quit smoking and triggers to stay away from.     Expected Outcomes  Short: go a whole week without smoking. Long: quit smoking for good.        ITP  Comments: ITP Comments    Row Name 07/05/18 1420 07/28/18 1354 08/25/18 0615       ITP Comments  Med review completed. Initial ITP created. Diagnosis can be found in [facility] 02/26/18  30 day review completed. ITP sent to Dr. Emily Filbert, Medical Director of Cardiac Rehab. Continue with ITP unless changes are made by physician.  30 day review. Continue with ITP unless directed changes by Medical Director chart review.        Comments:

## 2018-08-26 DIAGNOSIS — I214 Non-ST elevation (NSTEMI) myocardial infarction: Secondary | ICD-10-CM

## 2018-08-26 DIAGNOSIS — I252 Old myocardial infarction: Secondary | ICD-10-CM | POA: Diagnosis not present

## 2018-08-26 NOTE — Progress Notes (Signed)
Daily Session Note  Patient Details  Name: Karen Potts MRN: [id number] Date of Birth: 1960/10/08 Referring Provider:     Cardiac Rehab from 07/05/2018 in [facility] Cardiac and Pulmonary Rehab  Referring Provider  Karen Cowman MD      Encounter Date: 08/26/2018  Check In: Session Check In - 08/26/18 0920      Check-In   Supervising physician immediately available to respond to emergencies  See telemetry face sheet for immediately available ER MD    Location  [facility]-Cardiac & Pulmonary Rehab    Staff Present  Justin Mend RCP,RRT,BSRT;Carroll Enterkin, RN, BSN;Jeanna Durrell BS, Exercise Physiologist;Jessica Luan Pulling, MA, RCEP, CCRP, Exercise Physiologist    Medication changes reported      No    Fall or balance concerns reported     No    Warm-up and Cool-down  Performed as group-led instruction    Resistance Training Performed  Yes    VAD Patient?  No      Pain Assessment   Currently in Pain?  No/denies          Social History   Tobacco Use  Smoking Status Former Smoker  . Types: Cigarettes  . Last attempt to quit: 02/24/2018  . Years since quitting: 0.5  Smokeless Tobacco Never Used  Tobacco Comment   [name] said she quit smoking the day of her heart attack.    Goals Met:  Independence with exercise equipment Exercise tolerated well No report of cardiac concerns or symptoms Strength training completed today  Goals Unmet:  Not Applicable  Comments: Pt able to follow exercise prescription today without complaint.  Will continue to monitor for progression.    Dr. Emily Filbert is Medical Director for [place] and [facility].

## 2018-08-31 ENCOUNTER — Encounter: Payer: Medicaid Other | Attending: Cardiology

## 2018-08-31 DIAGNOSIS — Z955 Presence of coronary angioplasty implant and graft: Secondary | ICD-10-CM | POA: Insufficient documentation

## 2018-08-31 DIAGNOSIS — Z7984 Long term (current) use of oral hypoglycemic drugs: Secondary | ICD-10-CM | POA: Insufficient documentation

## 2018-08-31 DIAGNOSIS — I214 Non-ST elevation (NSTEMI) myocardial infarction: Secondary | ICD-10-CM

## 2018-08-31 DIAGNOSIS — G4730 Sleep apnea, unspecified: Secondary | ICD-10-CM | POA: Insufficient documentation

## 2018-08-31 DIAGNOSIS — Z79899 Other long term (current) drug therapy: Secondary | ICD-10-CM | POA: Insufficient documentation

## 2018-08-31 DIAGNOSIS — Z87891 Personal history of nicotine dependence: Secondary | ICD-10-CM | POA: Diagnosis not present

## 2018-08-31 DIAGNOSIS — Z7902 Long term (current) use of antithrombotics/antiplatelets: Secondary | ICD-10-CM | POA: Diagnosis not present

## 2018-08-31 DIAGNOSIS — I10 Essential (primary) hypertension: Secondary | ICD-10-CM | POA: Diagnosis not present

## 2018-08-31 DIAGNOSIS — I252 Old myocardial infarction: Secondary | ICD-10-CM | POA: Insufficient documentation

## 2018-08-31 DIAGNOSIS — E119 Type 2 diabetes mellitus without complications: Secondary | ICD-10-CM | POA: Diagnosis not present

## 2018-08-31 NOTE — Progress Notes (Signed)
Daily Session Note  Patient Details  Name: Karen Potts MRN: [id number] Date of Birth: June 17, 1961 Referring Provider:     Cardiac Rehab from 07/05/2018 in [facility] Cardiac and Pulmonary Rehab  Referring Provider  Isaias Cowman MD      Encounter Date: 08/31/2018  Check In: Session Check In - 08/31/18 0917      Check-In   Supervising physician immediately available to respond to emergencies  See telemetry face sheet for immediately available ER MD    Location  [facility]-Cardiac & Pulmonary Rehab    Staff Present  Alberteen Sam, MA, RCEP, CCRP, Exercise Physiologist;Wallace Gappa RCP,RRT,BSRT;Jeanna Durrell BS, Exercise Physiologist;Susanne Bice, RN, BSN, CCRP    Medication changes reported      No    Fall or balance concerns reported     No    Warm-up and Cool-down  Performed as group-led Higher education careers adviser Performed  Yes    VAD Patient?  No    PAD/SET Patient?  No      Pain Assessment   Currently in Pain?  No/denies          Social History   Tobacco Use  Smoking Status Former Smoker  . Types: Cigarettes  . Last attempt to quit: 02/24/2018  . Years since quitting: 0.5  Smokeless Tobacco Never Used  Tobacco Comment   Caelie said she quit smoking the day of her heart attack.    Goals Met:  Independence with exercise equipment Exercise tolerated well No report of cardiac concerns or symptoms Strength training completed today  Goals Unmet:  Not Applicable  Comments: Pt able to follow exercise prescription today without complaint.  Will continue to monitor for progression.    Dr. Emily Filbert is Medical Director for [place] and [facility].

## 2018-09-02 DIAGNOSIS — I214 Non-ST elevation (NSTEMI) myocardial infarction: Secondary | ICD-10-CM

## 2018-09-02 DIAGNOSIS — I252 Old myocardial infarction: Secondary | ICD-10-CM | POA: Diagnosis not present

## 2018-09-02 LAB — GLUCOSE, CAPILLARY: Glucose-Capillary: 120 mg/dL — ABNORMAL HIGH (ref 70–99)

## 2018-09-02 NOTE — Progress Notes (Signed)
Daily Session Note  Patient Details  Name: Karen Potts MRN: [id number] Date of Birth: [birth year] Referring Provider:     Cardiac Rehab from 07/05/2018 in [facility] Cardiac and Pulmonary Rehab  Referring Provider  Karen Cowman MD      Encounter Date: 09/02/2018  Check In: Session Check In - 09/02/18 0909      Check-In   Supervising physician immediately available to respond to emergencies  See telemetry face sheet for immediately available ER MD    Location  [facility]-Cardiac & Pulmonary Rehab    Staff Present  Karen Sam, MA, RCEP, CCRP, Exercise Physiologist;Karen Potts BS, Exercise Physiologist;Karen Enterkin, RN, BSN;Karen Potts RCP,RRT,BSRT    Medication changes reported      No    Fall or balance concerns reported     No    Warm-up and Cool-down  Performed as group-led Higher education careers adviser Performed  Yes    VAD Patient?  No    PAD/SET Patient?  No      Pain Assessment   Currently in Pain?  No/denies          Social History   Tobacco Use  Smoking Status Former Smoker  . Types: Cigarettes  . Last attempt to quit: 02/24/2018  . Years since quitting: 0.5  Smokeless Tobacco Never Used  Tobacco Comment   Karen Potts said she quit smoking the day of her heart attack.    Goals Met:  Independence with exercise equipment Exercise tolerated well No report of cardiac concerns or symptoms Strength training completed today  Goals Unmet:  Not Applicable  Comments: Pt able to follow exercise prescription today without complaint.  Will continue to monitor for progression.  Karen Potts got dizzy and lightheaded on treadmill. Her pressure was 158/84 and sugar was 120.  She felt better after and sitting an drinking some water.  She was able to get up and continue to walk on track for exercise.   Dr. Emily Potts is Medical Director for [place] and [facility].

## 2018-09-07 ENCOUNTER — Encounter: Payer: Medicaid Other | Admitting: *Deleted

## 2018-09-07 VITALS — Ht 68.5 in | Wt 287.0 lb

## 2018-09-07 DIAGNOSIS — Z955 Presence of coronary angioplasty implant and graft: Secondary | ICD-10-CM

## 2018-09-07 DIAGNOSIS — I214 Non-ST elevation (NSTEMI) myocardial infarction: Secondary | ICD-10-CM

## 2018-09-07 DIAGNOSIS — I252 Old myocardial infarction: Secondary | ICD-10-CM | POA: Diagnosis not present

## 2018-09-07 NOTE — Progress Notes (Signed)
Daily Session Note  Patient Details  Name: Karen Potts MRN: [id number] Date of Birth: Nov 14, 1960 Referring Provider:     Cardiac Rehab from 07/05/2018 in [facility] Cardiac and Pulmonary Rehab  Referring Provider  Isaias Cowman MD      Encounter Date: 09/07/2018  Check In: Session Check In - 09/07/18 0919      Check-In   Supervising physician immediately available to respond to emergencies  See telemetry face sheet for immediately available ER MD    Location  [facility]-Cardiac & Pulmonary Rehab    Staff Present  Heath Lark, RN, BSN, CCRP;Jeanna Durrell BS, Exercise Physiologist;Jeily Guthridge [place], MA, RCEP, CCRP, Exercise Physiologist;Joseph [employer], BA, ACSM CEP, Exercise Physiologist    Medication changes reported      No    Fall or balance concerns reported     No    Warm-up and Cool-down  Performed as group-led instruction    Resistance Training Performed  Yes    VAD Patient?  No    PAD/SET Patient?  No      Pain Assessment   Currently in Pain?  No/denies          Social History   Tobacco Use  Smoking Status Former Smoker  . Types: Cigarettes  . Last attempt to quit: 02/24/2018  . Years since quitting: 0.5  Smokeless Tobacco Never Used  Tobacco Comment   Ivan said she quit smoking the day of her heart attack.    Goals Met:  Independence with exercise equipment Exercise tolerated well No report of cardiac concerns or symptoms Strength training completed today  Goals Unmet:  Not Applicable  Comments: Pt able to follow exercise prescription today without complaint.  Will continue to monitor for progression.  [place] Name 07/05/18 1410 09/07/18 1526       6 Minute Walk   Phase  Initial  Discharge    Distance  1020 feet  1200 feet    Distance % Change  -  17.6 %    Distance Feet Change  -  180 ft    Walk Time  6 minutes  6 minutes    # of Rest Breaks  0  0    MPH  1.93  2.27    METS  3  2.84    RPE  17  17    Perceived Dyspnea   3  1    VO2 Peak  10.53  9.9    Symptoms  Yes (comment)  Yes (comment)    Comments  hip pain 8/10; side stitch 4/10  knee and hip pain 6/10    Resting HR  73 bpm  61 bpm    Resting BP  142/74  118/58    Resting Oxygen Saturation   97 %  -    Exercise Oxygen Saturation  during 6 min walk  99 %  99 %    Max Ex. HR  145 bpm  115 bpm    Max Ex. BP  176/84  158/64    2 Minute Post BP  172/80 140/80  -        Dr. Emily Filbert is Medical Director for [facility].

## 2018-09-09 ENCOUNTER — Other Ambulatory Visit: Payer: Self-pay

## 2018-09-09 DIAGNOSIS — I252 Old myocardial infarction: Secondary | ICD-10-CM | POA: Diagnosis not present

## 2018-09-09 DIAGNOSIS — I214 Non-ST elevation (NSTEMI) myocardial infarction: Secondary | ICD-10-CM

## 2018-09-09 NOTE — Progress Notes (Signed)
Daily Session Note  Patient Details  Name: Karen Potts MRN: [id number] Date of Birth: [birth year] Referring Provider:     Cardiac Rehab from 07/05/2018 in [facility] Cardiac and Pulmonary Rehab  Referring Provider  Isaias Cowman MD      Encounter Date: 09/09/2018  Check In: Session Check In - 09/09/18 1230      Check-In   Supervising physician immediately available to respond to emergencies  See telemetry face sheet for immediately available ER MD    Location  [facility]-Cardiac & Pulmonary Rehab    Staff Present  Alberteen Sam, MA, RCEP, CCRP, Exercise Physiologist;Jadin Creque [employer];Heath Lark, RN, BSN, CCRP    Medication changes reported      No    Fall or balance concerns reported     No    Warm-up and Cool-down  Performed as group-led instruction    Resistance Training Performed  Yes    VAD Patient?  No    PAD/SET Patient?  No      Pain Assessment   Currently in Pain?  No/denies          Social History   Tobacco Use  Smoking Status Former Smoker  . Types: Cigarettes  . Last attempt to quit: 02/24/2018  . Years since quitting: 0.5  Smokeless Tobacco Never Used  Tobacco Comment   Makynleigh said she quit smoking the day of her heart attack.    Goals Met:  Independence with exercise equipment Exercise tolerated well No report of cardiac concerns or symptoms Strength training completed today  Goals Unmet:  Not Applicable  Comments: Pt able to follow exercise prescription today without complaint.  Will continue to monitor for progression.    Dr. Emily Filbert is Medical Director for [facility].

## 2018-09-11 ENCOUNTER — Other Ambulatory Visit: Payer: Self-pay

## 2018-09-11 ENCOUNTER — Encounter: Payer: Self-pay | Admitting: Emergency Medicine

## 2018-09-11 ENCOUNTER — Emergency Department
Admission: EM | Admit: 2018-09-11 | Discharge: 2018-09-11 | Disposition: A | Discharge status: Home / Self Care | Payer: Medicaid Other | Attending: Emergency Medicine | Admitting: Emergency Medicine

## 2018-09-11 DIAGNOSIS — R109 Unspecified abdominal pain: Secondary | ICD-10-CM | POA: Diagnosis not present

## 2018-09-11 DIAGNOSIS — R42 Dizziness and giddiness: Secondary | ICD-10-CM

## 2018-09-11 DIAGNOSIS — Z5321 Procedure and treatment not carried out due to patient leaving prior to being seen by health care provider: Secondary | ICD-10-CM | POA: Diagnosis not present

## 2018-09-11 LAB — COMPREHENSIVE METABOLIC PANEL
ALT: 35 U/L (ref 0–44)
AST: 48 U/L — ABNORMAL HIGH (ref 15–41)
Albumin: 4 g/dL (ref 3.5–5.0)
Alkaline Phosphatase: 53 U/L (ref 38–126)
Anion gap: 11 (ref 5–15)
BUN: 21 mg/dL — ABNORMAL HIGH (ref 6–20)
CO2: 23 mmol/L (ref 22–32)
Calcium: 9.2 mg/dL (ref 8.9–10.3)
Chloride: 103 mmol/L (ref 98–111)
Creatinine, Ser: 0.76 mg/dL (ref 0.44–1.00)
GFR calc Af Amer: >60 mL/min (ref >60)
GFR calc non Af Amer: >60 mL/min (ref >60)
Glucose, Bld: 165 mg/dL — ABNORMAL HIGH (ref 70–99)
Potassium: 3.6 mmol/L (ref 3.5–5.1)
Sodium: 137 mmol/L (ref 135–145)
Total Bilirubin: 0.6 mg/dL (ref 0.3–1.2)
Total Protein: 7.9 g/dL (ref 6.5–8.1)

## 2018-09-11 LAB — CBC
HCT: 43.6 % (ref 36.0–46.0)
Hemoglobin: 14.2 g/dL (ref 12.0–15.0)
MCH: 27.7 pg (ref 26.0–34.0)
MCHC: 32.6 g/dL (ref 30.0–36.0)
MCV: 85.2 fL (ref 80.0–100.0)
Platelets: 133 10*3/uL — ABNORMAL LOW (ref 150–400)
RBC: 5.12 MIL/uL — ABNORMAL HIGH (ref 3.87–5.11)
RDW: 14.6 % (ref 11.5–15.5)
WBC: 12.2 10*3/uL — ABNORMAL HIGH (ref 4.0–10.5)
nRBC: 0 % (ref 0.0–0.2)

## 2018-09-11 LAB — TROPONIN I: Troponin I: <0.03 ng/mL (ref <0.03)

## 2018-09-11 LAB — LIPASE, BLOOD: Lipase: 44 U/L (ref 11–51)

## 2018-09-11 MED ORDER — ONDANSETRON HCL 4 MG/2ML IJ SOLN
4.0000 mg | Freq: Once | INTRAMUSCULAR | Status: AC | PRN
  Administered 2018-09-11: 4 mg via INTRAVENOUS
  Filled 2018-09-11: qty 2

## 2018-09-11 MED ORDER — MECLIZINE HCL 25 MG PO TABS
25.0000 mg | ORAL_TABLET | Freq: Once | ORAL | Status: AC
  Administered 2018-09-11: 25 mg via ORAL
  Filled 2018-09-11: qty 1

## 2018-09-11 MED ORDER — DIAZEPAM 5 MG PO TABS: 5.0000 mg | ORAL_TABLET | Freq: Three times a day (TID) | ORAL | 0 refills | Status: AC | PRN

## 2018-09-11 MED ORDER — DIAZEPAM 5 MG/ML IJ SOLN
5.0000 mg | Freq: Once | INTRAMUSCULAR | Status: AC
  Administered 2018-09-11: 5 mg via INTRAVENOUS
  Filled 2018-09-11: qty 2

## 2018-09-11 MED ORDER — SODIUM CHLORIDE 0.9 % IV BOLUS
1000.0000 mL | Freq: Once | INTRAVENOUS | Status: AC
  Administered 2018-09-11: 1000 mL via INTRAVENOUS

## 2018-09-11 NOTE — ED Provider Notes (Signed)
Reexamined the patient, she reports her vertigo has significantly improved, she is ready to go home again her bed.  She will follow-up with ENT   Jene Every, MD 09/11/18 [phone]

## 2018-09-11 NOTE — Discharge Instructions (Signed)
Please seek medical attention for any high fevers, chest pain, shortness of breath, change in behavior, persistent vomiting, bloody stool or any other new or concerning symptoms.  

## 2018-09-11 NOTE — ED Provider Notes (Signed)
[facility] Emergency Department Provider Note  ____________________________________________   I have reviewed the triage vital signs and the nursing notes.   HISTORY  Chief Complaint Dizziness; Nausea; and Emesis   History limited by: Not Limited   HPI Karen Potts is a 58 y.o. female who presents to the emergency department today because of concerns for dizziness, nausea and vomiting.  The patient states the symptoms started this morning.  She had felt fine when she went to bed last night.  She does have a history of vertigo and this reminds her of her previous episodes.  She has now had a few episodes of vertigo.  She does have some medicine from previous episodes at home and states she did not try any and she is not sure if any remembering day.  She has not ENT for this in the past.  Patient also has a history of heart disease and states that she has felt dizzy when she has had a heart attack in the past.  She however denies any chest pain or shortness of breath.  She denies any fevers.   Per medical record review patient has a history of DM  Past Medical History:  Diagnosis Date  . Diabetes mellitus without complication (HCC)   . Hypertension   . Sleep apnea   . Vertigo     Patient Active Problem List   Diagnosis Date Noted  . HTN (hypertension) 07/05/2018  . Tobacco use 07/05/2018  . S/P drug eluting coronary stent placement 03/15/2018  . Unstable angina (HCC) 02/24/2018  . NSTEMI (non-ST elevated myocardial infarction) (HCC) 02/24/2018  . Full thickness rotator cuff tear 07/31/2017    Past Surgical History:  Procedure Laterality Date  . ABDOMINAL HYSTERECTOMY    . BREAST BIOPSY Left 10/2017    APOCRINE TYPE CYST WITH FIBROSIS Of the Wall  . CORONARY STENT INTERVENTION N/A 02/24/2018   Procedure: CORONARY STENT INTERVENTION;  Surgeon: Marcina Millard, MD;  Location: [facility] INVASIVE CV LAB;  Service: Cardiovascular;  Laterality: N/A;  .  LEFT HEART CATH AND CORONARY ANGIOGRAPHY Right 02/24/2018   Procedure: LEFT HEART CATH AND CORONARY ANGIOGRAPHY;  Surgeon: Marcina Millard, MD;  Location: [facility] INVASIVE CV LAB;  Service: Cardiovascular;  Laterality: Right;  . SHOULDER ARTHROSCOPY WITH OPEN ROTATOR CUFF REPAIR Left 08/14/2017   Procedure: SHOULDER ARTHROSCOPY WITH ROTATOR CUFF REPAIR, BICEPS TENOTOMY, SUBACROMIAL DECOMPRESSION;  Surgeon: Lyndle Herrlich, MD;  Location: [facility] ORS;  Service: Orthopedics;  Laterality: Left;  . UMBILICAL HERNIA REPAIR      Prior to Admission medications   Medication Sig Start Date End Date Taking? Authorizing Provider  atorvastatin (LIPITOR) 80 MG tablet Take 1 tablet (80 mg total) by mouth daily at 6 PM. Patient not taking: Reported on 07/05/2018 02/26/18 07/05/18  Ihor Austin, MD  cyclobenzaprine (FLEXERIL) 10 MG tablet cyclobenzaprine 10 mg tablet  Take 1 tablet every 8 hours by oral route for 3 days.    [provider]  lisinopril-hydrochlorothiazide (PRINZIDE,ZESTORETIC) 10-12.5 MG tablet TAKE 1 TABLET BY MOUTH ONCE DAILY 05/01/17   Chrismon, Jodell Cipro, PA  loratadine (CLARITIN) 10 MG tablet Take by mouth. 06/10/18   [provider]  meclizine (ANTIVERT) 12.5 MG tablet Take 1 tablet (12.5 mg total) by mouth 3 (three) times daily as needed for dizziness or nausea. Patient not taking: Reported on 07/05/2018 12/13/17   Sharyn Creamer, MD  meclizine (ANTIVERT) 25 MG tablet Take 1 tablet (25 mg total) by mouth 3 (three) times daily as  needed for dizziness. 04/10/18   Phineas Semen, MD  meloxicam (MOBIC) 15 MG tablet meloxicam 15 mg tablet    [provider]  metFORMIN (GLUCOPHAGE) 500 MG tablet Take 500 mg by mouth 2 (two) times daily with a meal.    [provider]  metFORMIN (GLUCOPHAGE) 500 MG tablet Take by mouth.    [provider]  nitroGLYCERIN (NITROSTAT) 0.4 MG SL tablet Place 1 tablet (0.4 mg total) under the tongue every 5 (five) minutes as needed  for chest pain. 02/26/18   Ihor Austin, MD  prasugrel (EFFIENT) 10 MG TABS tablet Take by mouth. 04/27/18   [provider]    Allergies Patient has no known allergies.  Family History  Problem Relation Age of Onset  . Breast cancer Neg Hx     Social History Social History   Tobacco Use  . Smoking status: Former Smoker    Types: Cigarettes    Last attempt to quit: 02/24/2018    Years since quitting: 0.5  . Smokeless tobacco: Never Used  . Tobacco comment: Davi said she quit smoking the day of her heart attack.  Substance Use Topics  . Alcohol use: No  . Drug use: No    Review of Systems Constitutional: No fever/chills Eyes: No visual changes. ENT: No sore throat. Cardiovascular: Denies chest pain. Respiratory: Denies shortness of breath. Gastrointestinal: No abdominal pain.  Positive for nausea and vomiting.  Genitourinary: Negative for dysuria. Musculoskeletal: Negative for back pain. Skin: Negative for rash. Neurological: Positive for dizziness.   ____________________________________________   PHYSICAL EXAM:  VITAL SIGNS: ED Triage Vitals  Enc Vitals Group     BP 09/11/18 0445 (!) 160/57     Pulse Rate 09/11/18 0445 (!) 59     Resp 09/11/18 0445 20     Temp 09/11/18 0445 97.7 F (36.5 C)     Temp Source 09/11/18 0445 Oral     SpO2 09/11/18 0445 98 %     Weight 09/11/18 0449 285 lb (129.3 kg)     Height 09/11/18 0449 5\' 7"  (1.702 m)     Head Circumference --      Peak Flow --      Pain Score 09/11/18 0448 10   Constitutional: Alert and oriented.  Eyes: Conjunctivae are normal.  ENT      Head: Normocephalic and atraumatic.      Nose: No congestion/rhinnorhea.      Mouth/Throat: Mucous membranes are moist.      Neck: No stridor. Hematological/Lymphatic/Immunilogical: No cervical lymphadenopathy. Cardiovascular: Normal rate, regular rhythm.  No murmurs, rubs, or gallops.  Respiratory: Normal respiratory effort without tachypnea nor  retractions. Breath sounds are clear and equal bilaterally. No wheezes/rales/rhonchi. Gastrointestinal: Soft and non tender. No rebound. No guarding.  Genitourinary: Deferred Musculoskeletal: Normal range of motion in all extremities. No lower extremity edema. Neurologic:  Normal speech and language. No gross focal neurologic deficits are appreciated.  Skin:  Skin is warm, dry and intact. No rash noted. Psychiatric: Mood and affect are normal. Speech and behavior are normal. Patient exhibits appropriate insight and judgment.  ____________________________________________    LABS (pertinent positives/negatives)  Lipase 44 CBC wbc 12.2, hgb 14.2, plt 133 CMP wnl except glu 165, bun 21, ast 48  ____________________________________________   EKG  None  ____________________________________________    RADIOLOGY  None  ____________________________________________   PROCEDURES  Procedures  ____________________________________________   INITIAL IMPRESSION / ASSESSMENT AND PLAN / ED COURSE  Pertinent labs & imaging results that  were available during my care of the patient were reviewed by me and considered in my medical decision making (see chart for details).   Patient presented to the emergency department today because of concerns for nausea vomiting and dizziness.  Patient states she has a history of vertigo and this feels similar to her previous episodes.  On exam patient does appear quite uncomfortable with any movement.  She does state that it gets better when she stays still.  Did have some vomiting.  At this point I think vertigo likely.  Patient was initially given some meclizine without any significant relief.  Will trial Valium.  Patient does improve I think it would be reasonable for follow-up with ENT.   ____________________________________________   FINAL CLINICAL IMPRESSION(S) / ED DIAGNOSES  Final diagnoses:  Vertigo     Note: This dictation was prepared  with Dragon dictation. Any transcriptional errors that result from this process are unintentional     Phineas Semen, MD 09/11/18 [phone]

## 2018-09-11 NOTE — ED Triage Notes (Signed)
Patient presents to Emergency Department via [facility] from HOME with complaints of N/V/dizziness fro the last hour.  Pt has hx of vertigo, last episode 2 months ago, follows with ENT for same.   Hx of 2 stents Feb 27 2018

## 2018-09-11 NOTE — ED Notes (Signed)
MD at bedside. 

## 2018-09-22 ENCOUNTER — Encounter: Payer: Self-pay | Admitting: *Deleted

## 2018-09-22 DIAGNOSIS — I214 Non-ST elevation (NSTEMI) myocardial infarction: Secondary | ICD-10-CM

## 2018-09-22 NOTE — Progress Notes (Signed)
Cardiac Individual Treatment Plan  Patient Details  Name: Karen Potts MRN: [id number] Date of Birth: [birth year] Referring Provider:     Cardiac Rehab from 07/05/2018 in [facility] Cardiac and Pulmonary Rehab  Referring Provider  Isaias Cowman MD      Initial Encounter Date:    Cardiac Rehab from 07/05/2018 in [facility] Cardiac and Pulmonary Rehab  Date  07/05/18      Visit Diagnosis: NSTEMI (non-ST elevated myocardial infarction) [facility])  Patient's Home Medications on Admission:  Current Outpatient Medications:  .  atorvastatin (LIPITOR) 80 MG tablet, Take 1 tablet (80 mg total) by mouth daily at 6 PM. (Patient not taking: Reported on 07/05/2018), Disp: 30 tablet, Rfl: 0 .  cyclobenzaprine (FLEXERIL) 10 MG tablet, cyclobenzaprine 10 mg tablet  Take 1 tablet every 8 hours by oral route for 3 days., Disp: , Rfl:  .  diazepam (VALIUM) 5 MG tablet, Take 1 tablet (5 mg total) by mouth every 8 (eight) hours as needed (dizziness)., Disp: 30 tablet, Rfl: 0 .  lisinopril-hydrochlorothiazide (PRINZIDE,ZESTORETIC) 10-12.5 MG tablet, TAKE 1 TABLET BY MOUTH ONCE DAILY, Disp: 90 tablet, Rfl: 1 .  loratadine (CLARITIN) 10 MG tablet, Take by mouth., Disp: , Rfl:  .  meclizine (ANTIVERT) 12.5 MG tablet, Take 1 tablet (12.5 mg total) by mouth 3 (three) times daily as needed for dizziness or nausea. (Patient not taking: Reported on 07/05/2018), Disp: 30 tablet, Rfl: 1 .  meclizine (ANTIVERT) 25 MG tablet, Take 1 tablet (25 mg total) by mouth 3 (three) times daily as needed for dizziness., Disp: 30 tablet, Rfl: 0 .  meloxicam (MOBIC) 15 MG tablet, meloxicam 15 mg tablet, Disp: , Rfl:  .  metFORMIN (GLUCOPHAGE) 500 MG tablet, Take 500 mg by mouth 2 (two) times daily with a meal., Disp: , Rfl:  .  metFORMIN (GLUCOPHAGE) 500 MG tablet, Take by mouth., Disp: , Rfl:  .  nitroGLYCERIN (NITROSTAT) 0.4 MG SL tablet, Place 1 tablet (0.4 mg total) under the tongue every 5 (five) minutes as needed for chest pain., Disp: 30  tablet, Rfl: 12 .  prasugrel (EFFIENT) 10 MG TABS tablet, Take by mouth., Disp: , Rfl:   Past Medical History: Past Medical History:  Diagnosis Date  . Diabetes mellitus without complication ([place])   . Hypertension   . Sleep apnea   . Vertigo     Tobacco Use: Social History   Tobacco Use  Smoking Status Former Smoker  . Types: Cigarettes  . Last attempt to quit: 02/24/2018  . Years since quitting: 0.5  Smokeless Tobacco Never Used  Tobacco Comment   [name] said she quit smoking the day of her heart attack.    Labs: Recent Review Flowsheet Data    Labs for ITP Cardiac and Pulmonary Rehab Latest Ref Rng & Units 02/24/2018   Cholestrol 0 - 200 mg/dL 228(H)   LDLCALC 0 - 99 mg/dL 165(H)   HDL >40 mg/dL 36(L)   Trlycerides <150 mg/dL 134   Hemoglobin A1c 4.8 - 5.6 % 6.4(H)       Exercise Target Goals: Exercise Program Goal: Individual exercise prescription set using results from initial 6 min walk test and THRR while considering  patient's activity barriers and safety.   Exercise Prescription Goal: Initial exercise prescription builds to 30-45 minutes a day of aerobic activity, 2-3 days per week.  Home exercise guidelines will be given to patient during program as part of exercise prescription that the participant will acknowledge.  Activity Barriers & Risk Stratification: Activity  Barriers & Cardiac Risk Stratification - 07/05/18 1413      Activity Barriers & Cardiac Risk Stratification   Activity Barriers  Balance Concerns;Deconditioning;Muscular Weakness;Shortness of Breath;Joint Problems   vertigo, rotator cuff repair 07/2017, hip pain   Cardiac Risk Stratification  Moderate       6 Minute Walk: 6 Minute Walk    Row Name 07/05/18 1410 09/07/18 1526       6 Minute Walk   Phase  Initial  Discharge    Distance  1020 feet  1200 feet    Distance % Change  -  17.6 %    Distance Feet Change  -  180 ft    Walk Time  6 minutes  6 minutes    # of Rest Breaks  0  0     MPH  1.93  2.27    METS  3  2.84    RPE  17  17    Perceived Dyspnea   3  1    VO2 Peak  10.53  9.9    Symptoms  Yes (comment)  Yes (comment)    Comments  hip pain 8/10; side stitch 4/10  knee and hip pain 6/10    Resting HR  73 bpm  61 bpm    Resting BP  142/74  118/58    Resting Oxygen Saturation   97 %  -    Exercise Oxygen Saturation  during 6 min walk  99 %  99 %    Max Ex. HR  145 bpm  115 bpm    Max Ex. BP  176/84  158/64    2 Minute Post BP  172/80 140/80  -       Oxygen Initial Assessment:   Oxygen Re-Evaluation:   Oxygen Discharge (Final Oxygen Re-Evaluation):   Initial Exercise Prescription: Initial Exercise Prescription - 07/05/18 1400      Date of Initial Exercise RX and Referring Provider   Date  07/05/18    Referring Provider  Paraschos, Alexander MD      Treadmill   MPH  1.9    Grade  0.5    Minutes  15    METs  2.59      Recumbant Bike   Level  3    RPM  50    Watts  42    Minutes  15    METs  2.5      NuStep   Level  3    SPM  80    Minutes  15    METs  2.5      Prescription Details   Frequency (times per week)  2    Duration  Progress to 30 minutes of continuous aerobic without signs/symptoms of physical distress      Intensity   THRR 40-80% of Max Heartrate  109-144    Ratings of Perceived Exertion  11-13    Perceived Dyspnea  0-4      Progression   Progression  Continue to progress workloads to maintain intensity without signs/symptoms of physical distress.      Resistance Training   Training Prescription  Yes    Weight  3 lbs    Reps  10-15       Perform Capillary Blood Glucose checks as needed.  Exercise Prescription Changes: Exercise Prescription Changes    Row Name 07/05/18 1400 07/20/18 1100 08/03/18 1400 08/18/18 1600 09/01/18 1400     Response to Exercise   Blood Pressure (  Admit)  142/74  130/62  134/70  142/78  116/62   Blood Pressure (Exercise)  176/84  132/74  132/76  164/74  154/64   Blood Pressure  (Exit)  172/80 rck 142/74  128/62  112/58  140/70  122/56   Heart Rate (Admit)  73 bpm  72 bpm  79 bpm  69 bpm  97 bpm   Heart Rate (Exercise)  145 bpm  91 bpm  94 bpm  113 bpm  98 bpm   Heart Rate (Exit)  75 bpm  67 bpm  65 bpm  63 bpm  71 bpm   Oxygen Saturation (Admit)  97 %  -  -  -  -   Oxygen Saturation (Exercise)  99 %  -  -  -  -   Rating of Perceived Exertion (Exercise)  17  15  15  17  13    Perceived Dyspnea (Exercise)  3  -  -  -  -   Symptoms  SOB, hip pain 8/10; side stitch 4/10  none  none  none  none   Comments  walk test results  -  -  -  -   Duration  -  Continue with 30 min of aerobic exercise without signs/symptoms of physical distress.  Continue with 30 min of aerobic exercise without signs/symptoms of physical distress.  Continue with 30 min of aerobic exercise without signs/symptoms of physical distress.  Continue with 30 min of aerobic exercise without signs/symptoms of physical distress.   Intensity  -  THRR unchanged  THRR unchanged  THRR unchanged  THRR unchanged     Progression   Progression  -  Continue to progress workloads to maintain intensity without signs/symptoms of physical distress.  Continue to progress workloads to maintain intensity without signs/symptoms of physical distress.  Continue to progress workloads to maintain intensity without signs/symptoms of physical distress.  Continue to progress workloads to maintain intensity without signs/symptoms of physical distress.   Average METs  -  1.63  2.06  2.07  2.11     Resistance Training   Training Prescription  -  Yes  Yes  Yes  Yes   Weight  -  3 lbs  3 lbs  3 lbs  3 lbs   Reps  -  10-15  10-15  10-15  10-15     Interval Training   Interval Training  -  No  No  No  No     Treadmill   MPH  -  0.5  0.5  1  1    Grade  -  0.5  0.5  0.5  0.5   Minutes  -  15  15  15  15    METs  -  1.4  1.4  1.83  1.83     NuStep   Level  -  3  3  4  4    Minutes  -  15  15  15  15    METs  -  1.6  2.8  2.2  2.3      Arm Ergometer   Level  -  1  1  1  1    Minutes  -  15  15  15  15    METs  -  1.9  2  2.2  2.2     Home Exercise Plan   Plans to continue exercise at  -  [employer] (comment) [employer] under husband, walking  [employer] (comment) [employer] under [employer],  walking  [employer] (comment) [employer] under [facility], walking  [employer] (comment) [employer] under husband, walking   Frequency  -  Add 3 additional days to program exercise sessions.  Add 3 additional days to program exercise sessions.  Add 3 additional days to program exercise sessions.  Add 3 additional days to program exercise sessions.   Initial Home Exercises Provided  -  07/20/18  07/20/18  07/20/18  07/20/18   Row Name 09/15/18 1100             Response to Exercise   Blood Pressure (Admit)  140/62       Blood Pressure (Exercise)  156/64       Blood Pressure (Exit)  128/60       Heart Rate (Admit)  71 bpm       Heart Rate (Exercise)  89 bpm       Heart Rate (Exit)  63 bpm       Rating of Perceived Exertion (Exercise)  15       Symptoms  none       Duration  Continue with 30 min of aerobic exercise without signs/symptoms of physical distress.       Intensity  THRR unchanged         Progression   Progression  Continue to progress workloads to maintain intensity without signs/symptoms of physical distress.       Average METs  2.14         Resistance Training   Training Prescription  Yes       Weight  3 lbs       Reps  10-15         Interval Training   Interval Training  No         Treadmill   MPH  1       Grade  0.5       Minutes  15       METs  1.83         Recumbant Bike   Level  1       Watts  18       Minutes  15       METs  2.71         NuStep   Level  4       Minutes  15       METs  2.1         Home Exercise Plan   Plans to continue exercise at  [employer] (comment) [employer] under husband, walking       Frequency  Add 3 additional days to program exercise sessions.       Initial  Home Exercises Provided  07/20/18          Exercise Comments: Exercise Comments    Row Name 07/08/18 2010 09/02/18 1225         Exercise Comments   First full day of exercise!  Patient was oriented to gym and equipment including functions, settings, policies, and procedures.  Patient's individual exercise prescription and treatment plan were reviewed.  All starting workloads were established based on the results of the 6 minute walk test done at initial orientation visit.  The plan for exercise progression was also introduced and progression will be customized based on patient's performance and goals.  [name] got dizzy and lightheaded on treadmill. Her pressure was 158/84 and sugar was 120.  She felt better after and sitting an drinking some water.  She was able to get up and continue to walk on track for exercise.          Exercise Goals and Review: Exercise Goals    Row Name 07/05/18 1419             Exercise Goals   Increase Physical Activity  Yes       Intervention  Provide advice, education, support and counseling about physical activity/exercise needs.;Develop an individualized exercise prescription for aerobic and resistive training based on initial evaluation findings, risk stratification, comorbidities and participant's personal goals.       Expected Outcomes  Short Term: Attend rehab on a regular basis to increase amount of physical activity.;Long Term: Add in home exercise to make exercise part of routine and to increase amount of physical activity.;Long Term: Exercising regularly at least 3-5 days a week.       Increase Strength and Stamina  Yes       Intervention  Provide advice, education, support and counseling about physical activity/exercise needs.;Develop an individualized exercise prescription for aerobic and resistive training based on initial evaluation findings, risk stratification, comorbidities and participant's personal goals.       Expected Outcomes  Short Term:  Increase workloads from initial exercise prescription for resistance, speed, and METs.;Short Term: Perform resistance training exercises routinely during rehab and add in resistance training at home;Long Term: Improve cardiorespiratory fitness, muscular endurance and strength as measured by increased METs and functional capacity (6MWT)       Able to understand and use rate of perceived exertion (RPE) scale  Yes       Intervention  Provide education and explanation on how to use RPE scale       Expected Outcomes  Short Term: Able to use RPE daily in rehab to express subjective intensity level;Long Term:  Able to use RPE to guide intensity level when exercising independently       Able to understand and use Dyspnea scale  Yes       Intervention  Provide education and explanation on how to use Dyspnea scale       Expected Outcomes  Short Term: Able to use Dyspnea scale daily in rehab to express subjective sense of shortness of breath during exertion;Long Term: Able to use Dyspnea scale to guide intensity level when exercising independently       Knowledge and understanding of Target Heart Rate Range (THRR)  Yes       Intervention  Provide education and explanation of THRR including how the numbers were predicted and where they are located for reference       Expected Outcomes  Short Term: Able to state/look up THRR;Short Term: Able to use daily as guideline for intensity in rehab;Long Term: Able to use THRR to govern intensity when exercising independently       Able to check pulse independently  Yes       Intervention  Provide education and demonstration on how to check pulse in carotid and radial arteries.;Review the importance of being able to check your own pulse for safety during independent exercise       Expected Outcomes  Short Term: Able to explain why pulse checking is important during independent exercise;Long Term: Able to check pulse independently and accurately       Understanding of Exercise  Prescription  Yes       Intervention  Provide education, explanation, and written materials on patient's individual exercise prescription  Expected Outcomes  Short Term: Able to explain program exercise prescription;Long Term: Able to explain home exercise prescription to exercise independently          Exercise Goals Re-Evaluation : Exercise Goals Re-Evaluation    Row Name 07/08/18 0929 07/20/18 1126 08/03/18 1023 08/18/18 1606 09/01/18 1422     Exercise Goal Re-Evaluation   Exercise Goals Review  Increase Physical Activity;Able to understand and use rate of perceived exertion (RPE) scale;Knowledge and understanding of Target Heart Rate Range (THRR);Understanding of Exercise Prescription;Increase Strength and Stamina  Increase Physical Activity;Increase Strength and Stamina;Able to understand and use rate of perceived exertion (RPE) scale;Knowledge and understanding of Target Heart Rate Range (THRR);Able to check pulse independently;Understanding of Exercise Prescription  Increase Physical Activity;Increase Strength and Stamina  Increase Physical Activity;Increase Strength and Stamina;Understanding of Exercise Prescription  Increase Physical Activity;Increase Strength and Stamina;Understanding of Exercise Prescription   Comments  Reviewed RPE scale, THR and program prescription with pt today.  Pt voiced understanding and was given a copy of goals to take home.   Reviewed home exercise with pt today.  Pt plans to walk or join [facility] for exercise.  Reviewed THR, pulse, RPE, sign and symptoms, NTG use, and when to call 911 or MD.  Also discussed weather considerations and indoor options.  She is also interested in getting help with purchasing new shoes.  We will work with the foundation to help her.   [name] has not been able to find out about the [employer]. She plans on joining when she is done with the program. Patient states that she walks everyday even though it may not be much.  [name] continues to  do well in rehab.  She is up to 1.0 mph on the treadmill now!  We will continue to monitor her progress.   [name] has been doing well in rehab.  She wanted to keeping trying to use the recumbent bike, but her knee would continue to bother her so we switched her to the arm crank permanently.  We will continue to monitor her progress.    Expected Outcomes  Short: Use RPE daily to regulate intensity. Long: Follow program prescription in THR.  Short: Talk to [employer] about using husband's membership  Long: Exercise more at home  Short: join a gym after [employer]. Long: maintain exercise post [facility] independently.  Short: Continue to increase workloads.  Long: Ask about [employer]hip for outside class exercise.   Short: Increase arm crank workload.  Long: Continue walk more at home.    [place] Name 09/07/18 1116 09/15/18 1154           Exercise Goal Re-Evaluation   Exercise Goals Review  Increase Physical Activity;Able to understand and use rate of perceived exertion (RPE) scale;Knowledge and understanding of Target Heart Rate Range (THRR);Understanding of Exercise Prescription;Increase Strength and Stamina  Increase Physical Activity;Increase Strength and Stamina;Understanding of Exercise Prescription      Comments  [name] has noticed that she is moving a lot more at home since she started Cardiac Rehab. She has increased to Level 4 on the Nustep. She has an injection in her knee last week and it has helped a lot with knee pain. She hopes to continue exercising at the [facility] after graduation.   [name] continues to do well in rehab.  She is moving some at home and trying to walk in her room.  She misses [place] and looks forward to getting her videos.  We will continue to monitor her progress  at home.       Expected Outcomes  Short: Increase workoads on Nustep, begin using treadmill again since knee is feeling better. Long: Continue exercising after graduating at the [facility].  Short: Continue to walk and use videos at home. Long:  Continue to increase strength.          Discharge Exercise Prescription (Final Exercise Prescription Changes): Exercise Prescription Changes - 09/15/18 1100      Response to Exercise   Blood Pressure (Admit)  140/62    Blood Pressure (Exercise)  156/64    Blood Pressure (Exit)  128/60    Heart Rate (Admit)  71 bpm    Heart Rate (Exercise)  89 bpm    Heart Rate (Exit)  63 bpm    Rating of Perceived Exertion (Exercise)  15    Symptoms  none    Duration  Continue with 30 min of aerobic exercise without signs/symptoms of physical distress.    Intensity  THRR unchanged      Progression   Progression  Continue to progress workloads to maintain intensity without signs/symptoms of physical distress.    Average METs  2.14      Resistance Training   Training Prescription  Yes    Weight  3 lbs    Reps  10-15      Interval Training   Interval Training  No      Treadmill   MPH  1    Grade  0.5    Minutes  15    METs  1.83      Recumbant Bike   Level  1    Watts  18    Minutes  15    METs  2.71      NuStep   Level  4    Minutes  15    METs  2.1      Home Exercise Plan   Plans to continue exercise at  [employer] (comment)   [employer] under [employer], walking   Frequency  Add 3 additional days to program exercise sessions.    Initial Home Exercises Provided  07/20/18       Nutrition:  Target Goals: Understanding of nutrition guidelines, daily intake of sodium <1574m, cholesterol <203m calories 30% from fat and 7% or less from saturated fats, daily to have 5 or more servings of fruits and vegetables.  Biometrics: Pre Biometrics - 07/05/18 1420      Pre Biometrics   Height  5' 8.5" (1.74 m)    Weight  294 lb (133.4 kg)    Waist Circumference  46.5 inches    Hip Circumference  59.5 inches    Waist to Hip Ratio  0.78 %    BMI (Calculated)  44.05    Single Leg Stand  11.38 seconds      Post Biometrics - 09/07/18 1530       Post  Biometrics   Height  5' 8.5"  (1.74 m)    Weight  287 lb (130.2 kg)    Waist Circumference  46 inches    Hip Circumference  56 inches    Waist to Hip Ratio  0.82 %    BMI (Calculated)  43    Single Leg Stand  17.12 seconds       Nutrition Therapy Plan and Nutrition Goals: Nutrition Therapy & Goals - 07/08/18 1104      Nutrition Therapy   Diet  DM    Drug/Food Interactions  Statins/Certain Fruits  Protein (specify units)  11oz    Fiber  20 grams    Whole Grain Foods  3 servings   recently switched from white to white-wheat bread   Saturated Fats  13 max. grams    Fruits and Vegetables  4 servings/day   8 ideal; eats 2 meals/day but tries to include soft fruits and vegetables   Sodium  1500 grams      Personal Nutrition Goals   Nutrition Goal  Include sources of protein and fiber as part of your regular, daily diet to better manage BG levels    Personal Goal #2  Continue to decrease sugar concentration of soda by watering it down, and work on decreasing soda / sugar sweetened beverage intake in general    Personal Goal #3  Great job for switching from white to white-wheat bread. Work your way to choosing 100% whole wheat bread, especially since you eat bread so often    Comments  Pt and her husband are both diabetics. She had all of her teeth pulled last year and is waiting on Medicare to approve her for dentures. Wt loss of 10-15# reported d/t not being able to eat all of the foods she used to with teeth and eating less overall. She is happier at this weight. She has recently applied for Food Stamps d/t change in both job and living situations. She eats soft fruits (whole or fruit cup) and vegetables. She rinses off canned vegetables before consuming. Does not eat fried foods often d/t difficulty chewing. She has been trialing yogurt as a snack option and typically eats 2 meals/day. Brunch: cereal, sausage egg biscuit from [employer], eggs, bread. Dinner: salad, vegetables, hamburger, fish, potatoes, bread, peanut  butter and jelly sandwich. Beverages: water (trying to drink more), 2% milk, orange juice, soda. Explains she drinks OJ and milk together at brunch and has been watering down her soda d/t disliking diet soda. She does not add extra salt to anything she eats      Intervention Plan   Intervention  Prescribe, educate and counsel regarding individualized specific dietary modifications aiming towards targeted core components such as weight, hypertension, lipid management, diabetes, heart failure and other comorbidities.;Nutrition handout(s) given to patient.   General meal planning guidelines for DM type II   Expected Outcomes  Short Term Goal: A plan has been developed with personal nutrition goals set during dietitian appointment.;Short Term Goal: Understand basic principles of dietary content, such as calories, fat, sodium, cholesterol and nutrients.;Long Term Goal: Adherence to prescribed nutrition plan.       Nutrition Assessments: Nutrition Assessments - 07/05/18 1252      MEDFICTS Scores   Pre Score  41       Nutrition Goals Re-Evaluation: Nutrition Goals Re-Evaluation    [place] Name 07/08/18 1133 08/03/18 1027           Goals   Current Weight  -  292 lb (132.5 kg)      Nutrition Goal  Great job for switching from white to white-wheat bread. Work your way to choosing 100% whole wheat bread, especially since you eat bread so often  Lose weight and eat healthier.       Comment  She eats bread with most meals and recently switched to the white-wheat variety  [name] has made a few changes in her diet. She does not eat fried food. She eats more yogurt and drinks more water. She still has a habit with drinking sodas.  Expected Outcome  She will choose 100% whole wheat bread and monitor portions of bread consumed daily in relation to other foods to encourage diet variety  Short; lose 5 pounds the next two weeks. Long: make healthier eating choices independently.        Personal Goal #2  Re-Evaluation   Personal Goal #2  Continue to decrease sugar concentration of soda by watering it down, and work on decreasing soda / sugar sweetened beverage intake in general  -        Personal Goal #3 Re-Evaluation   Personal Goal #3  Include sources of protein and fiber as part of your regular, daily diet to better manage BG levels  -         Nutrition Goals Discharge (Final Nutrition Goals Re-Evaluation): Nutrition Goals Re-Evaluation - 08/03/18 1027      Goals   Current Weight  292 lb (132.5 kg)    Nutrition Goal  Lose weight and eat healthier.     Comment  [name] has made a few changes in her diet. She does not eat fried food. She eats more yogurt and drinks more water. She still has a habit with drinking sodas.     Expected Outcome  Short; lose 5 pounds the next two weeks. Long: make healthier eating choices independently.       Psychosocial: Target Goals: Acknowledge presence or absence of significant depression and/or stress, maximize coping skills, provide positive support system. Participant is able to verbalize types and ability to use techniques and skills needed for reducing stress and depression.   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 One month before it closed she fell at work and hurt her shoulder so got Workmen's Comp help and unemployment. Had a heart attack then was told the place she rented was being sold and they had to move in less than a month. Currently she is living in a hotel. Her grandchildren and son moved in with his girlfriend. Her husband is in the [place] hospital waiting for long term placement.       Family Dynamics   Good Support System?  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 One month before it closed she fell at work and hurt her shoulder so got Workmen's Comp help and unemployment which has run out. Had a heart attack then was told the place she rented was being sold and they had to move in less than a month. Currently she is living in a hotel. Her grandchildren and son moved in with his girlfriend. Her husband is in the [place] hospital waiting for long term placement. Shalayne recently got Medicaid. She said she quit reapplying for food stamps since they based it off her husbands income so she only got $12/month. Jesslyn said Voc Rehab may not help her now since "I really dont' have a place to live yet". Has filled out paperwork for [place], [place] housing and waiting to get approved. Jaquia said she prays a lot.      Barriers  Psychosocial barriers to participate in program  The patient should benefit from training in stress management and relaxation.      Screening Interventions   Interventions  Encouraged to exercise;Program counselor consult       Quality of Life Scores:  Quality of Life - 07/05/18 1251      Quality of Life   Select  Quality of Life      Quality of Life Scores   Health/Function Pre  15 %    Socioeconomic Pre  11 %    Psych/Spiritual Pre  19.71 %    Family Pre  14.4 %    GLOBAL Pre  15.19 %      Scores of 19 and below usually indicate a poorer quality of life in these areas.  A difference of  2-3 points is a clinically meaningful difference.  A difference of 2-3 points in the total score of the Quality of Life Index has been associated with significant improvement in overall quality of life, self-image, physical symptoms, and general health in studies assessing change in quality of life.  PHQ-9: Recent Review Flowsheet Data    Depression screen [facility] 2/9 07/05/2018   Decreased Interest 0   Down, Depressed, Hopeless 1   PHQ - 2 Score 1    Altered sleeping 0   Tired, decreased energy 3   Change in appetite 2   Feeling bad or failure about yourself  1   Trouble concentrating 0   Moving slowly or fidgety/restless 0   Suicidal thoughts 0   PHQ-9 Score 7   Difficult doing work/chores Not difficult at all     Interpretation of Total Score  Total Score Depression Severity:  1-4 = Minimal depression, 5-9 = Mild depression, 10-14 = Moderate depression, 15-19 = Moderately severe depression, 20-27 = Severe depression   Psychosocial Evaluation and Intervention: Psychosocial Evaluation - 07/15/18 1048      Psychosocial Evaluation & Interventions   Interventions  Stress management education;Encouraged to exercise with the program and follow exercise prescription;Relaxation education    Comments  Counselor met with Ms. Lovena Le ([name]) for initial psychosocial evaluation.  She is a 58 year old who had a heart attack in August and unable to attend this program due to lack of insurance.  She has a good support system with a spouse; a son and (2) daughters who all live locally.  She also has quite a few good friends.  [name] has multiple health issues as well as her cardiac condition with a rotator cuff surgery in the last few years and chronic knee problems.  She reports sleeping well and has a good appetite.  She has a history of depression but no current symptoms.  [name] has multiple stressors with her spouse being in the [place] in [place] with serious health problems; her own living situation being "homeless" recently due to the house renting being sold; her health and finances/insurance issues.  She has goals for this program to increase her stamina and strength for her mental health and her health.  Counselor provided [name] a list of community resources for homelessness and possible assistance during this time.  Counselor and staff will follow with her.     Expected Outcomes  Short:  [name] will obtain housing to help her life settle down and reduce stress.   She will exercise for her health and mental health.  She will attend and participate in the psychoeducational components of this program on stress and anxiety and learn positive  coping strategies to manage her stress better.  Long:  [name] will find a way to meet her housing and financial needs while caring for herself and others in positive ways.     Continue Psychosocial Services   Follow up required by counselor       Psychosocial Re-Evaluation: Psychosocial Re-Evaluation    [place] Name 08/12/18 1738 08/31/18 1044 09/07/18 1128         Psychosocial Re-Evaluation   Current issues with  Current Stress Concerns;Current Anxiety/Panic  -  Current Stress Concerns;Current Sleep Concerns     Comments  Counselor follow up with [name] today reporting ongoing problems with spouse who has been transferred to a LT care facility locally and is "bothering her" constantly about visiting and doing things for him.  [name] is struggling with her own health issues and was experiencing back pain today and some nausea as well.  Counselor explored with [name] ways to set limits and boundaries for improved self-care.  [name] practiced assertive statements with counselor and reported feeling empowered and less pain in her back at the end of our time together. Counselor will follow with [name] next week.  Counselor also facilitated staff providing [name] with a gas card to help with expenses since she has no income currently and is struggling financially.    Given gas card today  [name] reports that she is sleeping well. She says she is so happy. Her family is a stressor but that situation is currently okay. Her husband is in a long term care facility.      Expected Outcomes  Short:  [name] will set healthy boundaries and limits with others (particularly her spouse).  [name] will exercise for her health and as a stress reducer - for her mental health.  Long:  [name] will continue to practice assertiveness with others and develop positive self-care strategies -  including exercise consistently.   -  Short: Take care of [name]. Long: Continue exercising for stress reduction and find something that she enjoys to do for fun. Long: Look for resources when problems do arise in the future and continue taking care of her health.     Continue Psychosocial Services   Follow up required by counselor  -  -       Initial Review   Source of Stress Concerns  -  Transportation  -        Psychosocial Discharge (Final Psychosocial Re-Evaluation): Psychosocial Re-Evaluation - 09/07/18 1128      Psychosocial Re-Evaluation   Current issues with  Current Stress Concerns;Current Sleep Concerns    Comments  [name] reports that she is sleeping well. She says she is so happy. Her family is a stressor but that situation is currently okay. Her husband is in a long term care facility.     Expected Outcomes  Short: Take care of [name]. Long: Continue exercising for stress reduction and find something that she enjoys to do for fun. Long: Look for resources when problems do arise in the future and continue taking care of her health.       Vocational Rehabilitation: Provide vocational rehab assistance to qualifying candidates.   Vocational Rehab Evaluation & Intervention: Vocational Rehab - 07/05/18 1255      Initial Vocational Rehab Evaluation & Intervention   Assessment shows need for Vocational Rehabilitation  No      Vocational Rehab Re-Evaulation   Comments  Falicity wants to wait until she gets housing before she thinks about Voc Rehab.  Education: Education Goals: Education classes will be provided on a variety of topics geared toward better understanding of heart health and risk factor modification. Participant will state understanding/return demonstration of topics presented as noted by education test scores.  Learning Barriers/Preferences: Learning Barriers/Preferences - 07/05/18 1253      Learning Barriers/Preferences   Learning Barriers  Sight    Learning  Preferences  Verbal Instruction;Written Material       Education Topics:  AED/CPR: - Group verbal and written instruction with the use of models to demonstrate the basic use of the AED with the basic ABC's of resuscitation.   General Nutrition Guidelines/Fats and Fiber: -Group instruction provided by verbal, written material, models and posters to present the general guidelines for heart healthy nutrition. Gives an explanation and review of dietary fats and fiber.   Cardiac Rehab from 09/09/2018 in [facility] Cardiac and Pulmonary Rehab  Date  09/07/18  Educator  [facility]  Instruction Review Code  1- Verbalizes Understanding      Controlling Sodium/Reading Food Labels: -Group verbal and written material supporting the discussion of sodium use in heart healthy nutrition. Review and explanation with models, verbal and written materials for utilization of the food label.   Cardiac Rehab from 09/09/2018 in [facility] Cardiac and Pulmonary Rehab  Date  09/09/18  Educator  [facility]  Instruction Review Code  1- Verbalizes Understanding      Exercise Physiology & General Exercise Guidelines: - Group verbal and written instruction with models to review the exercise physiology of the cardiovascular system and associated critical values. Provides general exercise guidelines with specific guidelines to those with heart or lung disease.    Cardiac Rehab from 09/09/2018 in [facility] Cardiac and Pulmonary Rehab  Date  07/20/18  Educator  [facility]  Instruction Review Code  1- Verbalizes Understanding      Aerobic Exercise & Resistance Training: - Gives group verbal and written instruction on the various components of exercise. Focuses on aerobic and resistive training programs and the benefits of this training and how to safely progress through these programs..   Cardiac Rehab from 09/09/2018 in [facility] Cardiac and Pulmonary Rehab  Date  07/22/18  Educator  [place]  Instruction Review Code  1- Verbalizes Understanding       Flexibility, Balance, Mind/Body Relaxation: Provides group verbal/written instruction on the benefits of flexibility and balance training, including mind/body exercise modes such as yoga, pilates and tai chi.  Demonstration and skill practice provided.   Cardiac Rehab from 09/09/2018 in [facility] Cardiac and Pulmonary Rehab  Date  07/27/18  Educator  [facility]  Instruction Review Code  1- Verbalizes Understanding      Stress and Anxiety: - Provides group verbal and written instruction about the health risks of elevated stress and causes of high stress.  Discuss the correlation between heart/lung disease and anxiety and treatment options. Review healthy ways to manage with stress and anxiety.   Cardiac Rehab from 09/09/2018 in [facility] Cardiac and Pulmonary Rehab  Date  08/17/18  Educator  [facility]  Instruction Review Code  1- Verbalizes Understanding      Depression: - Provides group verbal and written instruction on the correlation between heart/lung disease and depressed mood, treatment options, and the stigmas associated with seeking treatment.   Cardiac Rehab from 09/09/2018 in [facility] Cardiac and Pulmonary Rehab  Date  08/31/18  Educator  [facility]  Instruction Review Code  1- Verbalizes Understanding      Anatomy & Physiology of the Heart: - Group verbal and written  instruction and models provide basic cardiac anatomy and physiology, with the coronary electrical and arterial systems. Review of Valvular disease and Heart Failure   Cardiac Procedures: - Group verbal and written instruction to review commonly prescribed medications for heart disease. Reviews the medication, class of the drug, and side effects. Includes the steps to properly store meds and maintain the prescription regimen. (beta blockers and nitrates)   Cardiac Rehab from 09/09/2018 in [facility] Cardiac and Pulmonary Rehab  Date  08/19/18  Educator  CE  Instruction Review Code  1- Verbalizes Understanding      Cardiac Medications I: - Group  verbal and written instruction to review commonly prescribed medications for heart disease. Reviews the medication, class of the drug, and side effects. Includes the steps to properly store meds and maintain the prescription regimen.   Cardiac Rehab from 09/09/2018 in [facility] Cardiac and Pulmonary Rehab  Date  08/10/18  Educator  [facility]  Instruction Review Code  1- Verbalizes Understanding      Cardiac Medications II: -Group verbal and written instruction to review commonly prescribed medications for heart disease. Reviews the medication, class of the drug, and side effects. (all other drug classes)   Cardiac Rehab from 09/09/2018 in [facility] Cardiac and Pulmonary Rehab  Date  09/02/18  Educator  CE  Instruction Review Code  1- Verbalizes Understanding       Go Sex-Intimacy & Heart Disease, Get SMART - Goal Setting: - Group verbal and written instruction through game format to discuss heart disease and the return to sexual intimacy. Provides group verbal and written material to discuss and apply goal setting through the application of the S.M.A.R.T. Method.   Cardiac Rehab from 09/09/2018 in [facility] Cardiac and Pulmonary Rehab  Date  08/19/18  Educator  CE  Instruction Review Code  1- Verbalizes Understanding      Other Matters of the Heart: - Provides group verbal, written materials and models to describe Stable Angina and Peripheral Artery. Includes description of the disease process and treatment options available to the cardiac patient.   Exercise & Equipment Safety: - Individual verbal instruction and demonstration of equipment use and safety with use of the equipment.   Cardiac Rehab from 09/09/2018 in [facility] Cardiac and Pulmonary Rehab  Date  07/05/18  Educator  Renita Papa, RN  Instruction Review Code  1- Verbalizes Understanding      Infection Prevention: - Provides verbal and written material to individual with discussion of infection control including proper hand washing and  proper equipment cleaning during exercise session.   Cardiac Rehab from 09/09/2018 in [facility] Cardiac and Pulmonary Rehab  Date  07/05/18  Educator  Renita Papa, RN  Instruction Review Code  1- Verbalizes Understanding      Falls Prevention: - Provides verbal and written material to individual with discussion of falls prevention and safety.   Cardiac Rehab from 09/09/2018 in [facility] Cardiac and Pulmonary Rehab  Date  07/05/18  Educator  Renita Papa, RN  Instruction Review Code  1- Verbalizes Understanding      Diabetes: - Individual verbal and written instruction to review signs/symptoms of diabetes, desired ranges of glucose level fasting, after meals and with exercise. Acknowledge that pre and post exercise glucose checks will be done for 3 sessions at entry of program.   Know Your Numbers and Risk Factors: -Group verbal and written instruction about important numbers in your health.  Discussion of what are risk factors and how they play a role in the disease process.  Review of Cholesterol, Blood Pressure, Diabetes, and BMI and the role they play in your overall health.   Cardiac Rehab from 09/09/2018 in [facility] Cardiac and Pulmonary Rehab  Date  09/02/18  Educator  CE  Instruction Review Code  1- Verbalizes Understanding      Sleep Hygiene: -Provides group verbal and written instruction about how sleep can affect your health.  Define sleep hygiene, discuss sleep cycles and impact of sleep habits. Review good sleep hygiene tips.    Cardiac Rehab from 09/09/2018 in [facility] Cardiac and Pulmonary Rehab  Date  08/03/18  Educator  [facility]  Instruction Review Code  1- Verbalizes Understanding      Other: -Provides group and verbal instruction on various topics (see comments)   Knowledge Questionnaire Score: Knowledge Questionnaire Score - 07/05/18 1254      Knowledge Questionnaire Score   Pre Score  20       Core Components/Risk Factors/Patient Goals at Admission: Personal Goals  and Risk Factors at Admission - 07/05/18 1256      Core Components/Risk Factors/Patient Goals on Admission    Weight Management  Yes;Obesity    Intervention  Weight Management: Develop a combined nutrition and exercise program designed to reach desired caloric intake, while maintaining appropriate intake of nutrient and fiber, sodium and fats, and appropriate energy expenditure required for the weight goal.    Admit Weight  294 lb (133.4 kg)    Goal Weight: Short Term  290 lb (131.5 kg)    Goal Weight: Long Term  275 lb (124.7 kg)    Expected Outcomes  Short Term: Continue to assess and modify interventions until short term weight is achieved;Long Term: Adherence to nutrition and physical activity/exercise program aimed toward attainment of established weight goal;Weight Loss: Understanding of general recommendations for a balanced deficit meal plan, which promotes 1-2 lb weight loss per week and includes a negative energy balance of [phone] kcal/d;Understanding recommendations for meals to include 15-35% energy as protein, 25-35% energy from fat, 35-60% energy from carbohydrates, less than 270m of dietary cholesterol, 20-35 gm of total fiber daily    Tobacco Cessation  Yes   Jeffery said she quit smoking the day of her heart attack.   Number of packs per day  0    Expected Outcomes  Long Term: Complete abstinence from all tobacco products for at least 12 months from quit date.    Diabetes  Yes    Intervention  Provide education about signs/symptoms and action to take for hypo/hyperglycemia.;Provide education about proper nutrition, including hydration, and aerobic/resistive exercise prescription along with prescribed medications to achieve blood glucose in normal ranges: Fasting glucose 65-99 mg/dL    Expected Outcomes  Short Term: Participant verbalizes understanding of the signs/symptoms and immediate care of hyper/hypoglycemia, proper foot care and importance of medication, aerobic/resistive  exercise and nutrition plan for blood glucose control.;Long Term: Attainment of HbA1C < 7%.    Stress  Yes    Intervention  Offer individual and/or small group education and counseling on adjustment to heart disease, stress management and health-related lifestyle change. Teach and support self-help strategies.;Refer participants experiencing significant psychosocial distress to appropriate mental health specialists for further evaluation and treatment. When possible, include family members and significant others in education/counseling sessions.    Expected Outcomes  Short Term: Participant demonstrates changes in health-related behavior, relaxation and other stress management skills, ability to obtain effective social support, and compliance with psychotropic medications if prescribed.       Core Components/Risk  Factors/Patient Goals Review:  Goals and Risk Factor Review    Row Name 08/03/18 1031 09/07/18 1136           Core Components/Risk Factors/Patient Goals Review   Personal Goals Review  Weight Management/Obesity;Diabetes;Hypertension;Tobacco Cessation  Weight Management/Obesity;Lipids;Hypertension;Diabetes;Tobacco Cessation      Review  [name] is trying to quit smoking. She may smoke a cigarrette one every couple days. Her husband gets on her nerves and sometimes she will have one. Her heart attack was the main factor that has helped to start quitting. Explained to patient certain ways to quit smoking and triggers to stay away from.   [name] is still trying to quit smoking. She has lost weight from 294 lbs down to 287 lbs. She attributes that to moving more and eating healthier. Her blood sugar readings are good. She takes it in the morning every other day. She does not have a blood pressure cuff at home. It is in storage. She takes her cholesterol medication as prescribed and is having bloodwork March 25th.       Expected Outcomes  Short: go a whole week without smoking. Long: quit smoking for  good.   Short: quit smoking. Long: Ask doctor about having bloodwork more regularly. Continue taking medications as prescribed. Either find blood pressure cuff or inquire about a way to get one so she can do home checks after graduation.         Core Components/Risk Factors/Patient Goals at Discharge (Final Review):  Goals and Risk Factor Review - 09/07/18 1136      Core Components/Risk Factors/Patient Goals Review   Personal Goals Review  Weight Management/Obesity;Lipids;Hypertension;Diabetes;Tobacco Cessation    Review  [name] is still trying to quit smoking. She has lost weight from 294 lbs down to 287 lbs. She attributes that to moving more and eating healthier. Her blood sugar readings are good. She takes it in the morning every other day. She does not have a blood pressure cuff at home. It is in storage. She takes her cholesterol medication as prescribed and is having bloodwork March 25th.     Expected Outcomes  Short: quit smoking. Long: Ask doctor about having bloodwork more regularly. Continue taking medications as prescribed. Either find blood pressure cuff or inquire about a way to get one so she can do home checks after graduation.       ITP Comments: ITP Comments    Row Name 07/05/18 1420 07/28/18 1354 08/25/18 0615 09/15/18 1154 09/22/18 1220   ITP Comments  Med review completed. Initial ITP created. Diagnosis can be found in [facility] 02/26/18  30 day review completed. ITP sent to Dr. Emily Filbert, Medical Director of Cardiac Rehab. Continue with ITP unless changes are made by physician.  30 day review. Continue with ITP unless directed changes by Medical Director chart review.  Our program is currently closed due to COVID-19.  We are communicating with patient via phone calls and emails.    30 day review. Continue with ITP unless directed changes by Medical Director chart review.      Comments:

## 2018-09-28 DIAGNOSIS — Z955 Presence of coronary angioplasty implant and graft: Secondary | ICD-10-CM

## 2018-09-28 DIAGNOSIS — I214 Non-ST elevation (NSTEMI) myocardial infarction: Secondary | ICD-10-CM

## 2018-10-25 ENCOUNTER — Encounter: Payer: Self-pay | Admitting: *Deleted

## 2018-10-25 DIAGNOSIS — Z955 Presence of coronary angioplasty implant and graft: Secondary | ICD-10-CM

## 2018-10-25 DIAGNOSIS — I214 Non-ST elevation (NSTEMI) myocardial infarction: Secondary | ICD-10-CM

## 2018-10-27 NOTE — Progress Notes (Signed)
Counselor contacted patient based on staff referral.  Conversation surrounding impact of COVID-19 on patient's ability to get out of the house and participate in cardiac rehab.  She expressed missing the program as it 'made her day.'  She continues to find ways to walk, and goes to the park when she's able to do so.  Family is a primary support for her.  Patient open to ongoing telephone contact.  Counselor to call back in 2 weeks, encouraged patient to call in as needed.

## 2018-11-10 NOTE — Progress Notes (Signed)
Counselor contacted patient regarding ongoing follow up.  Patient reported on spouse's unexpected death this past Nov 11, 2022 (Mother's Day).  Patient processed events of the day while counselor utilized active and reflective listening.  Patient focusing on arrangements for funeral at present.  Counselor to check in with patient next week.

## 2018-11-15 ENCOUNTER — Encounter: Payer: Self-pay | Admitting: *Deleted

## 2018-11-15 DIAGNOSIS — I214 Non-ST elevation (NSTEMI) myocardial infarction: Secondary | ICD-10-CM

## 2018-11-15 DIAGNOSIS — Z955 Presence of coronary angioplasty implant and graft: Secondary | ICD-10-CM

## 2018-11-17 NOTE — Progress Notes (Signed)
Counselor reached out to patient regarding ongoing follow up support.  Patient processed details related to spouse's death.  Counselor utilized active and reflective listening, offering grief education to assist in normalization process.  Children supportive.  Patient open to ongoing follow up.  Client to follow up in next week or two.

## 2018-11-26 ENCOUNTER — Other Ambulatory Visit: Payer: Self-pay

## 2018-11-26 NOTE — Progress Notes (Signed)
Counselor reached out to patient as discussed during last conversation.  Ongoing processing of death of spouse, normalization and education of patient's grief experience.  Patient reports that children call to check in frequently.  Patient expressed awareness that she is wrestling with how to not be a caregiver for someone.  Conversation surrounding idea of shifting to caring for herself.  Patient open to follow up in next 1 to 2 weeks.

## 2018-12-01 ENCOUNTER — Ambulatory Visit
Admission: RE | Admit: 2018-12-01 | Discharge: 2018-12-01 | Disposition: A | Discharge status: Home / Self Care | Payer: Medicaid Other | Source: Ambulatory Visit | Attending: Oncology | Admitting: Oncology

## 2018-12-01 ENCOUNTER — Ambulatory Visit: Payer: Medicaid Other | Attending: Oncology

## 2018-12-01 ENCOUNTER — Other Ambulatory Visit: Payer: Self-pay

## 2018-12-01 VITALS — BP 141/80 | HR 71 | Temp 98.0°F | Ht 67.0 in | Wt 287.0 lb

## 2018-12-01 DIAGNOSIS — N63 Unspecified lump in unspecified breast: Secondary | ICD-10-CM

## 2018-12-01 NOTE — Progress Notes (Signed)
  Subjective:     Patient ID: Karen Potts, female   DOB: 15-Apr-1961, 58 y.o.   MRN: [id number]  HPI   Review of Systems     Objective:   Physical Exam Chest:     Breasts:        Right: No swelling, bleeding, inverted nipple, mass, nipple discharge, skin change or tenderness.        Left: No swelling, bleeding, inverted nipple, mass, nipple discharge, skin change or tenderness.        Assessment:     58 year old patient returns for [facility] annual screening.  This is also a six month follow-up of a left breast mass.Patient screened, and meets [facility] eligibility.  Patient does not have insurance, Medicare or Medicaid.  Handout given on Affordable Care Act. Instructed patient on breast self awareness using teach back method.  Clinical breast exam unremarkable.  No mass or lump palpated.  Patient's husband passed away Nov 26, 2018.  She is trying  To get disability.  States she had a heart attack last year. Hasn ot taken her blood pressure medicine this morning.  Reviewed importance of taking meds regularly as prescribed.    Plan:     Sent for bilateral diagnostic mammogram, and ultrasound.

## 2018-12-02 NOTE — Progress Notes (Signed)
Birads 3 mammogram result with recommendation of 1 year follow-up diagnostic bilateral mammogram, and ultrasound of left breast mass for stability.  Mailed reminder for appointment scheduled 01/04/20 at 12:30p.m.

## 2018-12-07 ENCOUNTER — Encounter: Payer: Self-pay | Admitting: *Deleted

## 2018-12-07 DIAGNOSIS — Z955 Presence of coronary angioplasty implant and graft: Secondary | ICD-10-CM

## 2018-12-07 DIAGNOSIS — I214 Non-ST elevation (NSTEMI) myocardial infarction: Secondary | ICD-10-CM

## 2018-12-09 NOTE — Progress Notes (Signed)
Counselor reached out to patient regarding on going follow up.  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  Patient engaged in review of last 18 months, how life has shifted and changed.  Family remains a constant support.  Counselor to follow up in two weeks.

## 2018-12-23 NOTE — Progress Notes (Signed)
Counselor contacted patient for ongoing follow up.  Patient reported onset of vertigo yesterday, still feeling 'a little off.'  Counselor encouraged patient to reach out to physician as patient observed it has been happening more often.  Discussion surrounding impact of grief/stress on a person.  Patient became tearful talking about loss of spouse, has now been one month since his death.  Discussion surrounding family support, patient's reluctance to share her feelings with family due to life situations each is currently facing.  Patient remains in contact with her psychiatrist; she reported on their conversation from yesterday.  Patient remains open to counselor support.  Counselor will call again in two weeks.

## 2019-01-06 ENCOUNTER — Encounter: Payer: Medicaid Other | Attending: Cardiology

## 2019-01-06 DIAGNOSIS — I252 Old myocardial infarction: Secondary | ICD-10-CM | POA: Diagnosis not present

## 2019-01-06 DIAGNOSIS — Z955 Presence of coronary angioplasty implant and graft: Secondary | ICD-10-CM | POA: Diagnosis present

## 2019-01-06 DIAGNOSIS — I214 Non-ST elevation (NSTEMI) myocardial infarction: Secondary | ICD-10-CM

## 2019-01-06 NOTE — Progress Notes (Signed)
Daily Session Note  Patient Details  Name: DYNASTEE BRUMMELL MRN: [id number] Date of Birth: September 29, 1960 Referring Provider:     Cardiac Rehab from 07/05/2018 in [facility] Cardiac and Pul[facility] Rehab  Referring Provider  Isaias Cowman MD      Encounter Date: 01/06/2019  Check In: Session Check In - 01/06/19 0937      Check-In   Supervising physician immediately available to respond to emergencies  See telemetry face sheet for immediately available ER MD    Location  [facility]-Cardiac & Pulmonary Rehab    Staff Present  Justin Mend RCP,RRT,BSRT;Heath Lark, RN, BSN, CCRP;Jeanna Durrell BS, Exercise Physiologist;Melissa Caiola RDN, LDN    Virtual Visit  No    Medication changes reported      No    Fall or balance concerns reported     No    Tobacco Cessation  Use Increase   on and off since May about 2 Cigs a day   Resistance Training Performed  Yes    VAD Patient?  No    PAD/SET Patient?  No      Pain Assessment   Currently in Pain?  No/denies    Multiple Pain Sites  No          Social History   Tobacco Use  Smoking Status Former Smoker  . Types: Cigarettes  . Quit date: 02/24/2018  . Years since quitting: 0.8  Smokeless Tobacco Never Used  Tobacco Comment   Meggen said she quit smoking the day of her heart attack.    Goals Met:  Independence with exercise equipment Exercise tolerated well No report of cardiac concerns or symptoms Strength training completed today  Goals Unmet:  Not Applicable  Comments: Pt able to follow exercise prescription today without complaint.  Will continue to monitor for progression.    Dr. Emily Filbert is Medical Director for [place].

## 2019-01-12 ENCOUNTER — Encounter: Payer: Medicaid Other | Admitting: *Deleted

## 2019-01-12 ENCOUNTER — Other Ambulatory Visit: Payer: Self-pay

## 2019-01-12 ENCOUNTER — Encounter: Payer: Self-pay | Admitting: *Deleted

## 2019-01-12 DIAGNOSIS — Z955 Presence of coronary angioplasty implant and graft: Secondary | ICD-10-CM

## 2019-01-12 DIAGNOSIS — I214 Non-ST elevation (NSTEMI) myocardial infarction: Secondary | ICD-10-CM

## 2019-01-12 NOTE — Progress Notes (Signed)
Daily Session Note  Patient Details  Name: Karen Potts MRN: [id number] Date of Birth: March 03, 1961 Referring Provider:     Cardiac Rehab from 07/05/2018 in [facility] Cardiac and Pulmonary Rehab  Referring Provider  Isaias Cowman MD      Encounter Date: 01/12/2019  Check In: Session Check In - 01/12/19 0937      Check-In   Supervising physician immediately available to respond to emergencies  See telemetry face sheet for immediately available ER MD    Location  [facility]-Cardiac & Pulmonary Rehab    Staff Present  Alberteen Sam, MA, RCEP, CCRP, CCET;Joseph [place];Heath Lark, RN, BSN, CCRP    Virtual Visit  No    Medication changes reported      Yes    Comments  d/c'd meclazine    Fall or balance concerns reported     No    Warm-up and Cool-down  Performed on first and last piece of equipment    Resistance Training Performed  Yes    VAD Patient?  No    PAD/SET Patient?  No      Pain Assessment   Currently in Pain?  No/denies          Social History   Tobacco Use  Smoking Status Former Smoker  . Types: Cigarettes  . Quit date: 02/24/2018  . Years since quitting: 0.8  Smokeless Tobacco Never Used  Tobacco Comment   Kameah said she quit smoking the day of her heart attack.    Goals Met:  Independence with exercise equipment Exercise tolerated well No report of cardiac concerns or symptoms Strength training completed today  Goals Unmet:  Not Applicable  Comments: Pt able to follow exercise prescription today without complaint.  Will continue to monitor for progression.    Dr. Emily Filbert is Medical Director for [place] [facility].

## 2019-01-12 NOTE — Progress Notes (Signed)
Cardiac Individual Treatment Plan  Patient Details  Name: Karen Potts MRN: [id number] Date of Birth: 04/26/1961 Referring Provider:     Cardiac Rehab from 07/05/2018 in [facility] Cardiac and Pulmonary Rehab  Referring Provider  Karen Cowman MD      Initial Encounter Date:    Cardiac Rehab from 07/05/2018 in [facility] Cardiac and Pulmonary Rehab  Date  07/05/18      Visit Diagnosis: NSTEMI (non-ST elevated myocardial infarction) [facility])  Status post coronary artery stent placement  Patient's Home Medications on Admission:  Current Outpatient Medications:  .  atorvastatin (LIPITOR) 80 MG tablet, Take 1 tablet (80 mg total) by mouth daily at 6 PM. (Patient not taking: Reported on 07/05/2018), Disp: 30 tablet, Rfl: 0 .  cyclobenzaprine (FLEXERIL) 10 MG tablet, cyclobenzaprine 10 mg tablet  Take 1 tablet every 8 hours by oral route for 3 days., Disp: , Rfl:  .  diazepam (VALIUM) 5 MG tablet, Take 1 tablet (5 mg total) by mouth every 8 (eight) hours as needed (dizziness)., Disp: 30 tablet, Rfl: 0 .  lisinopril-hydrochlorothiazide (PRINZIDE,ZESTORETIC) 10-12.5 MG tablet, TAKE 1 TABLET BY MOUTH ONCE DAILY, Disp: 90 tablet, Rfl: 1 .  loratadine (CLARITIN) 10 MG tablet, Take by mouth., Disp: , Rfl:  .  meclizine (ANTIVERT) 12.5 MG tablet, Take 1 tablet (12.5 mg total) by mouth 3 (three) times daily as needed for dizziness or nausea. (Patient not taking: Reported on 07/05/2018), Disp: 30 tablet, Rfl: 1 .  meclizine (ANTIVERT) 25 MG tablet, Take 1 tablet (25 mg total) by mouth 3 (three) times daily as needed for dizziness., Disp: 30 tablet, Rfl: 0 .  meloxicam (MOBIC) 15 MG tablet, meloxicam 15 mg tablet, Disp: , Rfl:  .  metFORMIN (GLUCOPHAGE) 500 MG tablet, Take 500 mg by mouth 2 (two) times daily with a meal., Disp: , Rfl:  .  metFORMIN (GLUCOPHAGE) 500 MG tablet, Take by mouth., Disp: , Rfl:  .  nitroGLYCERIN (NITROSTAT) 0.4 MG SL tablet, Place 1 tablet (0.4 mg total) under the tongue every 5  (five) minutes as needed for chest pain., Disp: 30 tablet, Rfl: 12 .  prasugrel (EFFIENT) 10 MG TABS tablet, Take by mouth., Disp: , Rfl:   Past Medical History: Past Medical History:  Diagnosis Date  . Diabetes mellitus without complication ([place])   . Hypertension   . Sleep apnea   . Vertigo     Tobacco Use: Social History   Tobacco Use  Smoking Status Former Smoker  . Types: Cigarettes  . Quit date: 02/24/2018  . Years since quitting: 0.8  Smokeless Tobacco Never Used  Tobacco Comment   Karen Potts said she quit smoking the day of her heart attack.    Labs: Recent Review Flowsheet Data    Labs for ITP Cardiac and Pulmonary Rehab Latest Ref Rng & Units 02/24/2018   Cholestrol 0 - 200 mg/dL 228(H)   LDLCALC 0 - 99 mg/dL 165(H)   HDL >40 mg/dL 36(L)   Trlycerides <150 mg/dL 134   Hemoglobin A1c 4.8 - 5.6 % 6.4(H)       Exercise Target Goals: Exercise Program Goal: Individual exercise prescription set using results from initial 6 min walk test and THRR while considering  patient's activity barriers and safety.   Exercise Prescription Goal: Initial exercise prescription builds to 30-45 minutes a day of aerobic activity, 2-3 days per week.  Home exercise guidelines will be given to patient during program as part of exercise prescription that the participant will acknowledge.  Activity  Barriers & Risk Stratification:   6 Minute Walk: 6 Minute Walk    Row Name 09/07/18 1526         6 Minute Walk   Phase  Discharge     Distance  1200 feet     Distance % Change  17.6 %     Distance Feet Change  180 ft     Walk Time  6 minutes     # of Rest Breaks  0     MPH  2.27     METS  2.84     RPE  17     Perceived Dyspnea   1     VO2 Peak  9.9     Symptoms  Yes (comment)     Comments  knee and hip pain 6/10     Resting HR  61 bpm     Resting BP  118/58     Exercise Oxygen Saturation  during 6 min walk  99 %     Max Ex. HR  115 bpm     Max Ex. BP  158/64        Oxygen  Initial Assessment:   Oxygen Re-Evaluation:   Oxygen Discharge (Final Oxygen Re-Evaluation):   Initial Exercise Prescription:   Perform Capillary Blood Glucose checks as needed.  Exercise Prescription Changes: Exercise Prescription Changes    Row Name 07/20/18 1100 08/03/18 1400 08/18/18 1600 09/01/18 1400 09/15/18 1100     Response to Exercise   Blood Pressure (Admit)  130/62  134/70  142/78  116/62  140/62   Blood Pressure (Exercise)  132/74  132/76  164/74  154/64  156/64   Blood Pressure (Exit)  128/62  112/58  140/70  122/56  128/60   Heart Rate (Admit)  72 bpm  79 bpm  69 bpm  97 bpm  71 bpm   Heart Rate (Exercise)  91 bpm  94 bpm  113 bpm  98 bpm  89 bpm   Heart Rate (Exit)  67 bpm  65 bpm  63 bpm  71 bpm  63 bpm   Rating of Perceived Exertion (Exercise)  15  15  17  13  15    Symptoms  none  none  none  none  none   Duration  Continue with 30 min of aerobic exercise without signs/symptoms of physical distress.  Continue with 30 min of aerobic exercise without signs/symptoms of physical distress.  Continue with 30 min of aerobic exercise without signs/symptoms of physical distress.  Continue with 30 min of aerobic exercise without signs/symptoms of physical distress.  Continue with 30 min of aerobic exercise without signs/symptoms of physical distress.   Intensity  THRR unchanged  THRR unchanged  THRR unchanged  THRR unchanged  THRR unchanged     Progression   Progression  Continue to progress workloads to maintain intensity without signs/symptoms of physical distress.  Continue to progress workloads to maintain intensity without signs/symptoms of physical distress.  Continue to progress workloads to maintain intensity without signs/symptoms of physical distress.  Continue to progress workloads to maintain intensity without signs/symptoms of physical distress.  Continue to progress workloads to maintain intensity without signs/symptoms of physical distress.   Average METs  1.63   2.06  2.07  2.11  2.14     Resistance Training   Training Prescription  Yes  Yes  Yes  Yes  Yes   Weight  3 lbs  3 lbs  3 lbs  3 lbs  3 lbs   Reps  10-15  10-15  10-15  10-15  10-15     Interval Training   Interval Training  No  No  No  No  No     Treadmill   MPH  0.5  0.5  1  1  1    Grade  0.5  0.5  0.5  0.5  0.5   Minutes  15  15  15  15  15    METs  1.4  1.4  1.83  1.83  1.83     Recumbant Bike   Level  -  -  -  -  1   Watts  -  -  -  -  18   Minutes  -  -  -  -  15   METs  -  -  -  -  2.71     NuStep   Level  3  3  4  4  4    Minutes  15  15  15  15  15    METs  1.6  2.8  2.2  2.3  2.1     Arm Ergometer   Level  1  1  1  1   -   Minutes  15  15  15  15   -   METs  1.9  2  2.2  2.2  -     Home Exercise Plan   Plans to continue exercise at  [employer] (comment) [employer] under husband, walking  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  Add 3 additional days to program exercise sessions.  Add 3 additional days to program exercise sessions.  Add 3 additional days to program exercise sessions.  Add 3 additional days to program exercise sessions.   Initial Home Exercises Provided  07/20/18  07/20/18  07/20/18  07/20/18  07/20/18      Exercise Comments: Exercise Comments    Row Name 09/02/18 1225           Exercise Comments  [name] got dizzy and lightheaded on treadmill. Her pressure was 158/84 and sugar was 120.  She felt better after and sitting an drinking some water.  She was able to get up and continue to walk on track for exercise.           Exercise Goals and Review:   Exercise Goals Re-Evaluation : Exercise Goals Re-Evaluation    Row Name 07/20/18 1126 08/03/18 1023 08/18/18 1606 09/01/18 1422 09/07/18 1116      Exercise Goal Re-Evaluation   Exercise Goals Review  Increase Physical Activity;Increase Strength and Stamina;Able to understand and use rate of perceived exertion (RPE) scale;Knowledge and understanding of Target Heart Rate Range (THRR);Able to check pulse independently;Understanding of Exercise Prescription  Increase Physical Activity;Increase Strength and Stamina  Increase Physical Activity;Increase Strength and Stamina;Understanding of Exercise Prescription  Increase Physical Activity;Increase Strength and Stamina;Understanding of Exercise Prescription  Increase Physical Activity;Able to understand and use rate of perceived exertion (RPE) scale;Knowledge and understanding of Target Heart Rate Range (THRR);Understanding of Exercise Prescription;Increase Strength and Stamina   Comments  Reviewed home exercise with pt today.  Pt plans to walk or join [facility] for exercise.  Reviewed THR, pulse, RPE, sign and symptoms, NTG use, and when to call 911  or MD.  Also discussed weather considerations and indoor options.  She is also interested in getting help with purchasing new shoes.  We will work with the foundation to help her.   [name] has not been able to find out about the [employer]. She plans on joining when she is done with the program. Patient states that she walks everyday even though it may not be much.  [name] continues to do well in rehab.  She is up to 1.0 mph on the treadmill now!  We will continue to monitor her progress.   [name] has been doing well in rehab.  She wanted to keeping trying to use the recumbent bike, but her knee would continue to bother her so we switched her to the arm crank permanently.  We will continue to monitor her progress.   [name] has noticed that she is moving a lot more at home since she started Cardiac Rehab. She has increased to Level 4 on the Nustep. She has an injection in her knee last week and it has helped a lot with knee pain. She hopes to continue exercising at the [facility]  after graduation.    Expected Outcomes  Short: Talk to [facility] about using husband's membership  Long: Exercise more at home  Short: join a gym after [employer]. Long: maintain exercise post [employer] independently.  Short: Continue to increase workloads.  Long: Ask about [employer] mem[employer]hip for outside class exercise.   Short: Increase arm crank workload.  Long: Continue walk more at home.   Short: Increase workoads on Nustep, begin using treadmill again since knee is feeling better. Long: Continue exercising after graduating at the [facility].   [place] Name 09/15/18 1154 10/04/18 1150 10/25/18 1212 11/15/18 1249 12/07/18 1216     Exercise Goal Re-Evaluation   Exercise Goals Review  Increase Physical Activity;Increase Strength and Stamina;Understanding of Exercise Prescription  Increase Physical Activity;Increase Strength and Stamina;Understanding of Exercise Prescription  Increase Physical Activity;Increase Strength and Stamina;Understanding of Exercise Prescription  Increase Physical Activity;Increase Strength and Stamina;Understanding of Exercise Prescription  Increase Physical Activity;Increase Strength and Stamina;Understanding of Exercise Prescription   Comments  [name] continues to do well in rehab.  She is moving some at home and trying to walk in her room.  She misses [place] and looks forward to getting her videos.  We will continue to monitor her progress at home.   [name] has been exercising at home.  She is not getting out to walk due to the pollen, but she is doing the videos that we sent out each day.  I encouraged her to continue to use the vidoes to keep up her stamina.  Her kids are also encouraging her to stay active as well.   [name] is doing well with her exercise. She gets out to walk at least 3-4x a week.  She continues to use our videos as well.  She is starting to get bored in her room.  She wants to maintain her stamina as much as possible.  [name] has been doing some exercises.  She has tried the videos and does her  weights and stretching each day. She is going to try to get back into the routine of walking again, for her head and health.    She wants to be able to get moving.   [name] continues to keep moving in her room. She has been walking the complex some too. She also stretches and does her weights daily.  Her hip and knee have  been bothering her again. We talked about icing and staying active.    Expected Outcomes  Short: Continue to walk and use videos at home. Long: Continue to increase strength.   Short: Continue to use vidoes each day.  Long: Continue to rebuild stamina.   Short: Continue to get out to walk and use videos.  Long: Continue to build up strength and stamina.   Short: Get back into routine of walking again.  Long: Continue to rebuild strength and stamina.   Short: Continue to walk and ice knee.  Long: Continue to maintain stamina.       Discharge Exercise Prescription (Final Exercise Prescription Changes): Exercise Prescription Changes - 09/15/18 1100      Response to Exercise   Blood Pressure (Admit)  140/62    Blood Pressure (Exercise)  156/64    Blood Pressure (Exit)  128/60    Heart Rate (Admit)  71 bpm    Heart Rate (Exercise)  89 bpm    Heart Rate (Exit)  63 bpm    Rating of Perceived Exertion (Exercise)  15    Symptoms  none    Duration  Continue with 30 min of aerobic exercise without signs/symptoms of physical distress.    Intensity  THRR unchanged      Progression   Progression  Continue to progress workloads to maintain intensity without signs/symptoms of physical distress.    Average METs  2.14      Resistance Training   Training Prescription  Yes    Weight  3 lbs    Reps  10-15      Interval Training   Interval Training  No      Treadmill   MPH  1    Grade  0.5    Minutes  15    METs  1.83      Recumbant Bike   Level  1    Watts  18    Minutes  15    METs  2.71      NuStep   Level  4    Minutes  15    METs  2.1      Home Exercise Plan   Plans to  continue exercise at  [employer] (comment)   [employer] under husband, walking   Frequency  Add 3 additional days to program exercise sessions.    Initial Home Exercises Provided  07/20/18       Nutrition:  Target Goals: Understanding of nutrition guidelines, daily intake of sodium <1528m, cholesterol <2064m calories 30% from fat and 7% or less from saturated fats, daily to have 5 or more servings of fruits and vegetables.  Biometrics:  Post Biometrics - 09/07/18 1530       Post  Biometrics   Height  5' 8.5" (1.74 m)    Weight  287 lb (130.2 kg)    Waist Circumference  46 inches    Hip Circumference  56 inches    Waist to Hip Ratio  0.82 %    BMI (Calculated)  43    Single Leg Stand  17.12 seconds       Nutrition Therapy Plan and Nutrition Goals:   Nutrition Assessments:   Nutrition Goals Re-Evaluation: Nutrition Goals Re-Evaluation    [place] 08/03/18 1027 09/28/18 1414 01/06/19 1133         Goals   Current Weight  292 lb (132.5 kg)  -  -     Nutrition Goal  Lose weight and  eat healthier.   Lose weight and eat healthier  ST/LT: get back on track with [facility] eating with life stressors     Comment  [name] has made a few changes in her diet. She does not eat fried food. She eats more yogurt and drinks more water. She still has a habit with drinking sodas.   chicken soup and crackers, B: special K with strawberries cereal 2% milk and oatmeal. Biggest problem is with sodas: ~20oz/day mountain dew - adds water half, gets regular. drinks ~2 16oz/day.  canned vegetables rinse and drain and cook it with water. no cured meats or deli meat, but will have [place] breast.  grilled cheeses without butter, trying to stay away from butter.   Pt husband passed away in 2022-12-11, pt still dealing with loss and is not eating as healthy, but still is trying to manage changes she has made before his passing. Pt still thinks soda is her biggest problem, continues to water down, but will sometimes drink it  on its own, we discussed pairing carbohydrates like soda with healthy protein and fat to abvoid BG spikes. Pt is not taking BG     Expected Outcome  Short; lose 5 pounds the next two weeks. Long: make healthier eating choices independently.  ST: continue to lose weight LT: continue with healthy eating journey and making small changes  Get back on track with [facility] eating with life stressors        Nutrition Goals Discharge (Final Nutrition Goals Re-Evaluation): Nutrition Goals Re-Evaluation - 01/06/19 1133      Goals   Nutrition Goal  ST/LT: get back on track with [facility] eating with life stressors    Comment  Pt husband passed away in 11-Dec-2022, pt still dealing with loss and is not eating as healthy, but still is trying to manage changes she has made before his passing. Pt still thinks soda is her biggest problem, continues to water down, but will sometimes drink it on its own, we discussed pairing carbohydrates like soda with healthy protein and fat to abvoid BG spikes. Pt is not taking BG    Expected Outcome  Get back on track with [facility] eating with life stressors       Psychosocial: Target Goals: Acknowledge presence or absence of significant depression and/or stress, maximize coping skills, provide positive support system. Participant is able to verbalize types and ability to use techniques and skills needed for reducing stress and depression.   Initial Review & Psychosocial Screening:   Quality of Life Scores:   Scores of 19 and below usually indicate a poorer quality of life in these areas.  A difference of  2-3 points is a clinically meaningful difference.  A difference of 2-3 points in the total score of the Quality of Life Index has been associated with significant improvement in overall quality of life, self-image, physical symptoms, and general health in studies assessing change in quality of life.  PHQ-9: Recent Review Flowsheet Data    Depression screen [facility] 2/9 07/05/2018   Decreased Interest  0   Down, Depressed, Hopeless 1   PHQ - 2 Score 1   Altered sleeping 0   Tired, decreased energy 3   Change in appetite 2   Feeling bad or failure about yourself  1   Trouble concentrating 0   Moving slowly or fidgety/restless 0   Suicidal thoughts 0   PHQ-9 Score 7   Difficult doing work/chores Not difficult at all  Interpretation of Total Score  Total Score Depression Severity:  1-4 = Minimal depression, 5-9 = Mild depression, 10-14 = Moderate depression, 15-19 = Moderately severe depression, 20-27 = Severe depression   Psychosocial Evaluation and Intervention:   Psychosocial Re-Evaluation: Psychosocial Re-Evaluation    [place] Name 08/12/18 1738 08/31/18 1044 09/07/18 1128 10/04/18 1144 10/25/18 1219     Psychosocial Re-Evaluation   Current issues with  Current Stress Concerns;Current Anxiety/Panic  -  Current Stress Concerns;Current Sleep Concerns  Current Stress Concerns;Current Sleep Concerns  Current Stress Concerns;Current Sleep Concerns;Current Depression   Comments  Counselor follow up with [name] today reporting ongoing problems with spouse who has been transferred to a LT care facility locally and is "bothering her" constantly about visiting and doing things for him.  [name] is struggling with her own health issues and was experiencing back pain today and some nausea as well.  Counselor explored with [name] ways to set limits and boundaries for improved self-care.  [name] practiced assertive statements with counselor and reported feeling empowered and less pain in her back at the end of our time together. Counselor will follow with [name] next week.  Counselor also facilitated staff providing [name] with a gas card to help with expenses since she has no income currently and is struggling financially.    Given gas card today  [name] reports that she is sleeping well. She says she is so happy. Her family is a stressor but that situation is currently okay. Her husband is in a long term care facility.    [name] has been doing well at home.  Her kids call her daily to check up on her.  Her husband is making her crazy calling mulitple times a day since he has no visitors in his facility.  She is trying to cope with him as best she can. She is using her exercise to help as well.  She has been sleeping better and looks forward to our emails each day!!  [name] is doing okay.  Her kids are not calling or talking with her as frequently.  Her husband continues to make her crazy and makes odd request for her such as checking on his cars.  She continues to get frustrated with her housing situation, as she was approved for housing but with everything closed down, she can't move forward.  She is stressed about how things keep happening to her.  She started with shoulders, then knees, and then her heart.  She also has been having a rash on her legs and now it has started to spread and still having vertigo.  Her doctors keep defering to the other specialist.  I encouraged her to call her PCP to be seen, even if a virtual visit to show the rash and talk about how she is feeling.  I have also reached out to our conselor to get in touch with her to check up.  Some good news, she has gotten her Medicaid back during all of this and can get some of her medical bills covered this way.    Expected Outcomes  Short:  [name] will set healthy boundaries and limits with others (particularly her spouse).  [name] will exercise for her health and as a stress reducer - for her mental health.  Long:  [name] will continue to practice assertiveness with others and develop positive self-care strategies - including exercise consistently.   -  Short: Take care of [name]. Long: Continue exercising for stress reduction and find something that  she enjoys to do for fun. Long: Look for resources when problems do arise in the future and continue taking care of her health.  Short: Continue to practice self care and reach out to kids.  Long: Continue to exercise for stress  management.   Short: Continue to exercise for the mood boost. Talk to PCP.  Long: Continue to practice self care.    Interventions  -  -  -  Stress management education;Encouraged to attend Cardiac Rehabilitation for the exercise  Stress management education;Encouraged to attend Cardiac Rehabilitation for the exercise   Continue Psychosocial Services   Follow up required by counselor  -  -  Follow up required by staff  Follow up required by staff     Initial Review   Source of Stress Concerns  -  Transportation  -  -  -   [place] Name 11/15/18 1254 12/07/18 1219           Psychosocial Re-Evaluation   Current issues with  Current Stress Concerns;Current Sleep Concerns;Current Depression  Current Stress Concerns;Current Sleep Concerns;Current Depression      Comments  [name] is doing okay. She is still in shock.  She lost her husband on Mother's Day.  They buried him this past Saturday.  Her family and kids were all able ot be there!  She was happy to have everyone together for the grave side service.  She is now worried about money and getting into a home/apartment. She has moments of weepiness.  She has been caring for others so long she now feels a little lost. However, she has come to the realization that she now needs to take care of herself and make herself the priority.  She feels that God has put her right where she needs to be.  Her kids have been checking up on her and watching her closely.  Our conselor will be reaching out to her this week as well.  She is working with a case worker for housing but things are still on hold with pandemic and now she is awaiting on insurance as well.  We talked about making sure she takes the time to walk to help clear her head and for the mood boost.  Since her husband passes, she has not been able to sleep well and even took a valuim one night to get to sleep.  She will continue to work on herself.   [name] is holding up okay.  She still has moments of weepiness, but  her family has continued to check in on her daily.  She is working with case workers on her disabillty and her husband's benefits.  Sheis doing the best she can, but she is sleeping a little better.       Expected Outcomes  Short: Get out to walk for her head and heart.  Long: Continue to focus on herself now.   Short: Continue to get out of room to clear her head.  Long: Continue to practice self care.       Interventions  Stress management education;Encouraged to attend Cardiac Rehabilitation for the exercise  Stress management education;Encouraged to attend Cardiac Rehabilitation for the exercise      Continue Psychosocial Services   Follow up required by staff  Follow up required by staff         Psychosocial Discharge (Final Psychosocial Re-Evaluation): Psychosocial Re-Evaluation - 12/07/18 1219      Psychosocial Re-Evaluation   Current issues with  Current Stress Concerns;Current  Sleep Concerns;Current Depression    Comments  [name] is holding up okay.  She still has moments of weepiness, but her family has continued to check in on her daily.  She is working with case workers on her disabillty and her husband's benefits.  Sheis doing the best she can, but she is sleeping a little better.     Expected Outcomes  Short: Continue to get out of room to clear her head.  Long: Continue to practice self care.     Interventions  Stress management education;Encouraged to attend Cardiac Rehabilitation for the exercise    Continue Psychosocial Services   Follow up required by staff       Vocational Rehabilitation: Provide vocational rehab assistance to qualifying candidates.   Vocational Rehab Evaluation & Intervention:   Education: Education Goals: Education classes will be provided on a variety of topics geared toward better understanding of heart health and risk factor modification. Participant will state understanding/return demonstration of topics presented as noted by education test  scores.  Learning Barriers/Preferences:   Education Topics:  AED/CPR: - Group verbal and written instruction with the use of models to demonstrate the basic use of the AED with the basic ABC's of resuscitation.   General Nutrition Guidelines/Fats and Fiber: -Group instruction provided by verbal, written material, models and posters to present the general guidelines for heart healthy nutrition. Gives an explanation and review of dietary fats and fiber.   Cardiac Rehab from 09/09/2018 in [facility] Cardiac and Pulmonary Rehab  Date  09/07/18  Educator  [facility]  Instruction Review Code  1- Verbalizes Understanding      Controlling Sodium/Reading Food Labels: -Group verbal and written material supporting the discussion of sodium use in heart healthy nutrition. Review and explanation with models, verbal and written materials for utilization of the food label.   Cardiac Rehab from 09/09/2018 in [facility] Cardiac and Pulmonary Rehab  Date  09/09/18  Educator  [facility]  Instruction Review Code  1- Verbalizes Understanding      Exercise Physiology & General Exercise Guidelines: - Group verbal and written instruction with models to review the exercise physiology of the cardiovascular system and associated critical values. Provides general exercise guidelines with specific guidelines to those with heart or lung disease.    Cardiac Rehab from 09/09/2018 in [facility] Cardiac and Pulmonary Rehab  Date  07/20/18  Educator  [facility]  Instruction Review Code  1- Verbalizes Understanding      Aerobic Exercise & Resistance Training: - Gives group verbal and written instruction on the various components of exercise. Focuses on aerobic and resistive training programs and the benefits of this training and how to safely progress through these programs..   Cardiac Rehab from 09/09/2018 in [facility] Cardiac and Pulmonary Rehab  Date  07/22/18  Educator  [place]  Instruction Review Code  1- Verbalizes Understanding      Flexibility, Balance,  Mind/Body Relaxation: Provides group verbal/written instruction on the benefits of flexibility and balance training, including mind/body exercise modes such as yoga, pilates and tai chi.  Demonstration and skill practice provided.   Cardiac Rehab from 09/09/2018 in [facility] Cardiac and Pulmonary Rehab  Date  07/27/18  Educator  [facility]  Instruction Review Code  1- Verbalizes Understanding      Stress and Anxiety: - Provides group verbal and written instruction about the health risks of elevated stress and causes of high stress.  Discuss the correlation between heart/lung disease and anxiety and treatment options. Review healthy ways to manage with  stress and anxiety.   Cardiac Rehab from 09/09/2018 in [facility] Cardiac and Pulmonary Rehab  Date  08/17/18  Educator  [facility]  Instruction Review Code  1- Verbalizes Understanding      Depression: - Provides group verbal and written instruction on the correlation between heart/lung disease and depressed mood, treatment options, and the stigmas associated with seeking treatment.   Cardiac Rehab from 09/09/2018 in [facility] Cardiac and Pulmonary Rehab  Date  08/31/18  Educator  [facility]  Instruction Review Code  1- Verbalizes Understanding      Anatomy & Physiology of the Heart: - Group verbal and written instruction and models provide basic cardiac anatomy and physiology, with the coronary electrical and arterial systems. Review of Valvular disease and Heart Failure   Cardiac Procedures: - Group verbal and written instruction to review commonly prescribed medications for heart disease. Reviews the medication, class of the drug, and side effects. Includes the steps to properly store meds and maintain the prescription regimen. (beta blockers and nitrates)   Cardiac Rehab from 09/09/2018 in [facility] Cardiac and Pulmonary Rehab  Date  08/19/18  Educator  [facility]  Instruction Review Code  1- Verbalizes Understanding      Cardiac Medications I: - Group verbal and written  instruction to review commonly prescribed medications for heart disease. Reviews the medication, class of the drug, and side effects. Includes the steps to properly store meds and maintain the prescription regimen.   Cardiac Rehab from 09/09/2018 in [facility] Cardiac and Pulmonary Rehab  Date  08/10/18  Educator  [facility]  Instruction Review Code  1- Verbalizes Understanding      Cardiac Medications II: -Group verbal and written instruction to review commonly prescribed medications for heart disease. Reviews the medication, class of the drug, and side effects. (all other drug classes)   Cardiac Rehab from 09/09/2018 in [facility] Cardiac and Pulmonary Rehab  Date  09/02/18  Educator  CE  Instruction Review Code  1- Verbalizes Understanding       Go Sex-Intimacy & Heart Disease, Get SMART - Goal Setting: - Group verbal and written instruction through game format to discuss heart disease and the return to sexual intimacy. Provides group verbal and written material to discuss and apply goal setting through the application of the S.M.A.R.T. Method.   Cardiac Rehab from 09/09/2018 in [facility] Cardiac and Pulmonary Rehab  Date  08/19/18  Educator  CE  Instruction Review Code  1- Verbalizes Understanding      Other Matters of the Heart: - Provides group verbal, written materials and models to describe Stable Angina and Peripheral Artery. Includes description of the disease process and treatment options available to the cardiac patient.   Exercise & Equipment Safety: - Individual verbal instruction and demonstration of equipment use and safety with use of the equipment.   Cardiac Rehab from 09/09/2018 in [facility] Cardiac and Pulmonary Rehab  Date  07/05/18  Educator  Renita Papa, RN  Instruction Review Code  1- Verbalizes Understanding      Infection Prevention: - Provides verbal and written material to individual with discussion of infection control including proper hand washing and proper equipment  cleaning during exercise session.   Cardiac Rehab from 09/09/2018 in [facility] Cardiac and Pulmonary Rehab  Date  07/05/18  Educator  Renita Papa, RN  Instruction Review Code  1- Verbalizes Understanding      Falls Prevention: - Provides verbal and written material to individual with discussion of falls prevention and safety.   Cardiac Rehab from 09/09/2018  in [facility] Cardiac and Pulmonary Rehab  Date  07/05/18  Educator  Renita Papa, RN  Instruction Review Code  1- Verbalizes Understanding      Diabetes: - Individual verbal and written instruction to review signs/symptoms of diabetes, desired ranges of glucose level fasting, after meals and with exercise. Acknowledge that pre and post exercise glucose checks will be done for 3 sessions at entry of program.   Know Your Numbers and Risk Factors: -Group verbal and written instruction about important numbers in your health.  Discussion of what are risk factors and how they play a role in the disease process.  Review of Cholesterol, Blood Pressure, Diabetes, and BMI and the role they play in your overall health.   Cardiac Rehab from 09/09/2018 in [facility] Cardiac and Pulmonary Rehab  Date  09/02/18  Educator  CE  Instruction Review Code  1- Verbalizes Understanding      Sleep Hygiene: -Provides group verbal and written instruction about how sleep can affect your health.  Define sleep hygiene, discuss sleep cycles and impact of sleep habits. Review good sleep hygiene tips.    Cardiac Rehab from 09/09/2018 in [facility] Cardiac and Pulmonary Rehab  Date  08/03/18  Educator  [facility]  Instruction Review Code  1- Verbalizes Understanding      Other: -Provides group and verbal instruction on various topics (see comments)   Knowledge Questionnaire Score:   Core Components/Risk Factors/Patient Goals at Admission:   Core Components/Risk Factors/Patient Goals Review:  Goals and Risk Factor Review    Row Name 08/03/18 1031 09/07/18 1136 10/04/18  1147 10/25/18 1227 11/15/18 1252     Core Components/Risk Factors/Patient Goals Review   Personal Goals Review  Weight Management/Obesity;Diabetes;Hypertension;Tobacco Cessation  Weight Management/Obesity;Lipids;Hypertension;Diabetes;Tobacco Cessation  Weight Management/Obesity;Lipids;Hypertension;Diabetes;Tobacco Cessation  Weight Management/Obesity;Lipids;Hypertension;Diabetes;Tobacco Cessation  Weight Management/Obesity;Lipids;Hypertension;Diabetes;Tobacco Cessation   Review  [name] is trying to quit smoking. She may smoke a cigarrette one every couple days. Her husband gets on her nerves and sometimes she will have one. Her heart attack was the main factor that has helped to start quitting. Explained to patient certain ways to quit smoking and triggers to stay away from.   [name] is still trying to quit smoking. She has lost weight from 294 lbs down to 287 lbs. She attributes that to moving more and eating healthier. Her blood sugar readings are good. She takes it in the morning every other day. She does not have a blood pressure cuff at home. It is in storage. She takes her cholesterol medication as prescribed and is having bloodwork March 25th.   [name] is doing well in rehab. She is still working on the smoking, but the stress is making it hard for to quit just yet.  She had a follow up over the phone with her doctor and got a good report.  She has not been checking her pressures as she does not have a cuff at home.  She is knows to watch her fluid and keep moving as much as possible to help with all her numbers.  The stress is also causing her to stress eat and she nows she needs to cut back on the unhealthy snacking as it is making her swell and gain weight.   [name] has been doing okay at home.  She is still smoking with all of her stress.  Her weight has gone up 4 lbs over the last month from her stress eating.  She knows she needs to cut back.  She continues to keep  an eye on her sugars as well.    [name] is doing  the best she can.  She is still smoking some with her stress, but hopes to be able to quit once we get back to class.  She is doing the best she can and will now start to work on working on herself.    Expected Outcomes  Short: go a whole week without smoking. Long: quit smoking for good.   Short: quit smoking. Long: Ask doctor about having bloodwork more regularly. Continue taking medications as prescribed. Either find blood pressure cuff or inquire about a way to get one so she can do home checks after graduation.  Short: Continue to watch weight and cut back on snacking.   Long; Conitnue to work on quitting smoking.   Short: Work on weight loss and cut back on snacking.  Long: Continue to monitor risk factors.   Short: Try to lose weight.  Long: Continue to monitor risk factors.    [place] Name 12/07/18 1221             Core Components/Risk Factors/Patient Goals Review   Personal Goals Review  Weight Management/Obesity;Lipids;Hypertension;Diabetes;Tobacco Cessation       Review  [name] is managing. She feels more like herself and trying to walk more and eat better.         Expected Outcomes  Short: Try to lose weight.  Long: Continue to monitor risk factors.           Core Components/Risk Factors/Patient Goals at Discharge (Final Review):  Goals and Risk Factor Review - 12/07/18 1221      Core Components/Risk Factors/Patient Goals Review   Personal Goals Review  Weight Management/Obesity;Lipids;Hypertension;Diabetes;Tobacco Cessation    Review  [name] is managing. She feels more like herself and trying to walk more and eat better.      Expected Outcomes  Short: Try to lose weight.  Long: Continue to monitor risk factors.        ITP Comments: ITP Comments    Row Name 07/28/18 1354 08/25/18 0615 09/15/18 1154 09/22/18 1220 01/12/19 1420   ITP Comments  30 day review completed. ITP sent to Dr. Emily Filbert, Medical Director of Cardiac Rehab. Continue with ITP unless changes are made by physician.   30 day review. Continue with ITP unless directed changes by Medical Director chart review.  Our program is currently closed due to COVID-19.  We are communicating with patient via phone calls and emails.    30 day review. Continue with ITP unless directed changes by Medical Director chart review.  30 day review cycle restarting  after being closed since March 16 because of  Covid 19 pandemic. Program opened to patients on July 6. Not all have returned. ITP updated and sent to Medical Director for review,changes as needed and signature      Comments:

## 2019-01-13 ENCOUNTER — Encounter: Payer: Medicaid Other | Admitting: *Deleted

## 2019-01-13 DIAGNOSIS — Z955 Presence of coronary angioplasty implant and graft: Secondary | ICD-10-CM | POA: Diagnosis not present

## 2019-01-13 DIAGNOSIS — I214 Non-ST elevation (NSTEMI) myocardial infarction: Secondary | ICD-10-CM

## 2019-01-13 LAB — GLUCOSE, CAPILLARY: Glucose-Capillary: 90 mg/dL (ref 70–99)

## 2019-01-13 NOTE — Progress Notes (Signed)
Daily Session Note  Patient Details  Name: Karen Potts MRN: [id number] Date of Birth: [birth year] Referring Provider:     Cardiac Rehab from 07/05/2018 in [facility] Cardiac and Pulmonary Rehab  Referring Provider  Karen Cowman MD      Encounter Date: 01/13/2019  Check In: Session Check In - 01/13/19 1010      Check-In   Supervising physician immediately available to respond to emergencies  See telemetry face sheet for immediately available ER MD    Location  [facility]-Cardiac & Pulmonary Rehab    Staff Present  Karen Potts BS, Exercise Physiologist;Karen Vandekamp [place], MA, RCEP, CCRP, CCET;Karen Bice, RN, BSN, CCRP    Virtual Visit  No    Medication changes reported      No    Fall or balance concerns reported     No    Warm-up and Cool-down  Performed on first and last piece of equipment    Resistance Training Performed  Yes    VAD Patient?  No    PAD/SET Patient?  No      Pain Assessment   Currently in Pain?  No/denies          Social History   Tobacco Use  Smoking Status Former Smoker  . Types: Cigarettes  . Quit date: 02/24/2018  . Years since quitting: 0.8  Smokeless Tobacco Never Used  Tobacco Comment   Karen Potts said she quit smoking the day of her heart attack.    Goals Met:  Independence with exercise equipment Exercise tolerated well No report of cardiac concerns or symptoms Strength training completed today  Goals Unmet:  Not Applicable  Comments: Pt able to follow exercise prescription today without complaint.  Will continue to monitor for progression.    Dr. Emily Potts is Medical Director for [facility].

## 2019-01-14 NOTE — Progress Notes (Signed)
Counselor reached out to patient for ongoing follow up.  Patient reported just returning from gym and feelings about being able to reengage in the program.  Conversation surrounding issues with children, how she's supported them in the past, and how she is shifting to caring for herself.  Counselor to follow up in two to three weeks via telephone.

## 2019-01-19 ENCOUNTER — Other Ambulatory Visit: Payer: Self-pay

## 2019-01-19 ENCOUNTER — Encounter: Payer: Medicaid Other | Admitting: *Deleted

## 2019-01-19 DIAGNOSIS — Z955 Presence of coronary angioplasty implant and graft: Secondary | ICD-10-CM

## 2019-01-19 DIAGNOSIS — I214 Non-ST elevation (NSTEMI) myocardial infarction: Secondary | ICD-10-CM

## 2019-01-19 NOTE — Progress Notes (Signed)
Daily Session Note  Patient Details  Name: Karen Potts MRN: [id number] Date of Birth: [birth year] Referring Provider:     Cardiac Rehab from 07/05/2018 in [facility] Cardiac and Pulmonary Rehab  Referring Provider  Isaias Cowman MD      Encounter Date: 01/19/2019  Check In: Session Check In - 01/19/19 0945      Check-In   Supervising physician immediately available to respond to emergencies  See telemetry face sheet for immediately available ER MD    Location  [facility]-Cardiac & Pulmonary Rehab    Staff Present  Alberteen Sam, MA, RCEP, CCRP, CCET;Joseph [place];Heath Lark, RN, BSN, CCRP    Virtual Visit  No    Medication changes reported      No    Warm-up and Cool-down  Performed on first and last piece of equipment    Resistance Training Performed  Yes    VAD Patient?  No    PAD/SET Patient?  No      Pain Assessment   Currently in Pain?  No/denies          Social History   Tobacco Use  Smoking Status Former Smoker  . Types: Cigarettes  . Quit date: 02/24/2018  . Years since quitting: 0.9  Smokeless Tobacco Never Used  Tobacco Comment   Thanvi said she quit smoking the day of her heart attack.    Goals Met:  Independence with exercise equipment Exercise tolerated well No report of cardiac concerns or symptoms Strength training completed today  Goals Unmet:  Not Applicable  Comments: Pt able to follow exercise prescription today without complaint.  Will continue to monitor for progression.    Dr. Emily Filbert is Medical Director for [facility].

## 2019-01-19 NOTE — Patient Instructions (Signed)
Discharge Patient Instructions  Patient Details  Name: Karen Potts MRN: [id number] Date of Birth: 28-Nov-1960 Referring Provider:  Gennette Pac, [place]   Number of Visits: 68  Reason for Discharge:  Patient reached a stable level of exercise. Patient independent in their exercise. Patient has met program and personal goals.  Smoking History:  Social History   Tobacco Use  Smoking Status Former Smoker  . Types: Cigarettes  . Quit date: 02/24/2018  . Years since quitting: 0.9  Smokeless Tobacco Never Used  Tobacco Comment   [place] said she quit smoking the day of her heart attack.    Diagnosis:  NSTEMI (non-ST elevated myocardial infarction) [facility])  Status post coronary artery stent placement  Initial Exercise Prescription:   Discharge Exercise Prescription (Final Exercise Prescription Changes): Exercise Prescription Changes - 09/15/18 1100      Response to Exercise   Blood Pressure (Admit)  140/62    Blood Pressure (Exercise)  156/64    Blood Pressure (Exit)  128/60    Heart Rate (Admit)  71 bpm    Heart Rate (Exercise)  89 bpm    Heart Rate (Exit)  63 bpm    Rating of Perceived Exertion (Exercise)  15    Symptoms  none    Duration  Continue with 30 min of aerobic exercise without signs/symptoms of physical distress.    Intensity  THRR unchanged      Progression   Progression  Continue to progress workloads to maintain intensity without signs/symptoms of physical distress.    Average METs  2.14      Resistance Training   Training Prescription  Yes    Weight  3 lbs    Reps  10-15      Interval Training   Interval Training  No      Treadmill   MPH  1    Grade  0.5    Minutes  15    METs  1.83      Recumbant Bike   Level  1    Watts  18    Minutes  15    METs  2.71      NuStep   Level  4    Minutes  15    METs  2.1      Home Exercise Plan   Plans to continue exercise at  [employer] (comment)   [employer] under husband, walking    Frequency  Add 3 additional days to program exercise sessions.    Initial Home Exercises Provided  07/20/18       Functional Capacity: 6 Minute Walk    Row Name 09/07/18 1526         6 Minute Walk   Phase  Discharge     Distance  1200 feet     Distance % Change  17.6 %     Distance Feet Change  180 ft     Walk Time  6 minutes     # of Rest Breaks  0     MPH  2.27     METS  2.84     RPE  17     Perceived Dyspnea   1     VO2 Peak  9.9     Symptoms  Yes (comment)     Comments  knee and hip pain 6/10     Resting HR  61 bpm     Resting BP  118/58     Exercise Oxygen Saturation  during  6 min walk  99 %     Max Ex. HR  115 bpm     Max Ex. BP  158/64        Quality of Life: Quality of Life - 01/19/19 1034      Quality of Life   Select  Quality of Life      Quality of Life Scores   Health/Function Pre  15 %    Health/Function Post  18.4 %    Health/Function % Change  22.67 %    Socioeconomic Pre  11 %    Socioeconomic Post  17.21 %    Socioeconomic % Change   56.45 %    Psych/Spiritual Pre  19.71 %    Psych/Spiritual Post  14.57 %    Psych/Spiritual % Change  -26.08 %    Family Pre  14.4 %    Family Post  19.2 %    Family % Change  33.33 %    GLOBAL Pre  15.19 %    GLOBAL Post  17.49 %    GLOBAL % Change  15.14 %       Personal Goals: Goals established at orientation with interventions provided to work toward goal.    Personal Goals Discharge: Goals and Risk Factor Review - 12/07/18 1221      Core Components/Risk Factors/Patient Goals Review   Personal Goals Review  Weight Management/Obesity;Lipids;Hypertension;Diabetes;Tobacco Cessation    Review  Karen Potts is managing. She feels more like herself and trying to walk more and eat better.      Expected Outcomes  Short: Try to lose weight.  Long: Continue to monitor risk factors.        Exercise Goals and Review:   Exercise Goals Re-Evaluation: Exercise Goals Re-Evaluation    Row Name 08/03/18 1023 08/18/18  1606 09/01/18 1422 09/07/18 1116 09/15/18 1154     Exercise Goal Re-Evaluation   Exercise Goals Review  Increase Physical Activity;Increase Strength and Stamina  Increase Physical Activity;Increase Strength and Stamina;Understanding of Exercise Prescription  Increase Physical Activity;Increase Strength and Stamina;Understanding of Exercise Prescription  Increase Physical Activity;Able to understand and use rate of perceived exertion (RPE) scale;Knowledge and understanding of Target Heart Rate Range (THRR);Understanding of Exercise Prescription;Increase Strength and Stamina  Increase Physical Activity;Increase Strength and Stamina;Understanding of Exercise Prescription   Comments  Karen Potts has not been able to find out about the [employer]. She plans on joining when she is done with the program. Patient states that she walks everyday even though it may not be much.  Karen Potts continues to do well in rehab.  She is up to 1.0 mph on the treadmill now!  We will continue to monitor her progress.   Karen Potts has been doing well in rehab.  She wanted to keeping trying to use the recumbent bike, but her knee would continue to bother her so we switched her to the arm crank permanently.  We will continue to monitor her progress.   Karen Potts has noticed that she is moving a lot more at home since she started Cardiac Rehab. She has increased to Level 4 on the Nustep. She has an injection in her knee last week and it has helped a lot with knee pain. She hopes to continue exercising at the [facility] after graduation.   Karen Potts continues to do well in rehab.  She is moving some at home and trying to walk in her room.  She misses [place] and looks forward to getting her videos.  We will  continue to monitor her progress at home.    Expected Outcomes  Short: join a gym after [employer]. Long: maintain exercise post HeartTrack independently.  Short: Continue to increase workloads.  Long: Ask about [employer] for outside class exercise.   Short: Increase arm  crank workload.  Long: Continue walk more at home.   Short: Increase workoads on Nustep, begin using treadmill again since knee is feeling better. Long: Continue exercising after graduating at the [facility].  Short: Continue to walk and use videos at home. Long: Continue to increase strength.    [place] Name 10/04/18 1150 10/25/18 1212 11/15/18 1249 12/07/18 1216       Exercise Goal Re-Evaluation   Exercise Goals Review  Increase Physical Activity;Increase Strength and Stamina;Understanding of Exercise Prescription  Increase Physical Activity;Increase Strength and Stamina;Understanding of Exercise Prescription  Increase Physical Activity;Increase Strength and Stamina;Understanding of Exercise Prescription  Increase Physical Activity;Increase Strength and Stamina;Understanding of Exercise Prescription    Comments  Karen Potts has been exercising at home.  She is not getting out to walk due to the pollen, but she is doing the videos that we sent out each day.  I encouraged her to continue to use the vidoes to keep up her stamina.  Her kids are also encouraging her to stay active as well.   Karen Potts is doing well with her exercise. She gets out to walk at least 3-4x a week.  She continues to use our videos as well.  She is starting to get bored in her room.  She wants to maintain her stamina as much as possible.  Karen Potts has been doing some exercises.  She has tried the videos and does her weights and stretching each day. She is going to try to get back into the routine of walking again, for her head and health.    She wants to be able to get moving.   Karen Potts continues to keep moving in her room. She has been walking the complex some too. She also stretches and does her weights daily.  Her hip and knee have been bothering her again. We talked about icing and staying active.     Expected Outcomes  Short: Continue to use vidoes each day.  Long: Continue to rebuild stamina.   Short: Continue to get out to walk and use videos.  Long: Continue  to build up strength and stamina.   Short: Get back into routine of walking again.  Long: Continue to rebuild strength and stamina.   Short: Continue to walk and ice knee.  Long: Continue to maintain stamina.        Nutrition & Weight - Outcomes:  Post Biometrics - 09/07/18 1530       Post  Biometrics   Height  5' 8.5" (1.74 m)    Weight  287 lb (130.2 kg)    Waist Circumference  46 inches    Hip Circumference  56 inches    Waist to Hip Ratio  0.82 %    BMI (Calculated)  43    Single Leg Stand  17.12 seconds       Nutrition:   Nutrition Discharge: Nutrition Assessments - 01/19/19 1036      MEDFICTS Scores   Pre Score  41    Post Score  41    Score Difference  0       Education Questionnaire Score: Knowledge Questionnaire Score - 01/19/19 1036      Knowledge Questionnaire Score   Pre Score  20  Post Score  23/26       Goals reviewed with patient; copy given to patient.

## 2019-01-20 ENCOUNTER — Encounter: Payer: Medicaid Other | Admitting: *Deleted

## 2019-01-20 DIAGNOSIS — I214 Non-ST elevation (NSTEMI) myocardial infarction: Secondary | ICD-10-CM

## 2019-01-20 DIAGNOSIS — Z955 Presence of coronary angioplasty implant and graft: Secondary | ICD-10-CM | POA: Diagnosis not present

## 2019-01-20 NOTE — Progress Notes (Signed)
Discharge Progress Report  Patient Details  Name: Karen Potts MRN: [id number] Date of Birth: Dec 20, 1960 Referring Provider:     Cardiac Rehab from 07/05/2018 in [facility]  Referring Provider  Isaias Cowman MD       Number of Visits: 36  Reason for Discharge:  Patient reached a stable level of exercise. Patient independent in their exercise. Patient has met program and personal goals.  Smoking History:  Social History   Tobacco Use  Smoking Status Former Smoker  . Types: Cigarettes  . Quit date: 02/24/2018  . Years since quitting: 0.9  Smokeless Tobacco Never Used  Tobacco Comment   [name] said she quit smoking the day of her heart attack.    Diagnosis:  NSTEMI (non-ST elevated myocardial infarction) [facility])  Status post coronary artery stent placement  ADL [facility]:   Initial Exercise Prescription:   Discharge Exercise Prescription (Final Exercise Prescription Changes): Exercise Prescription Changes - 09/15/18 1100      Response to Exercise   Blood Pressure (Admit)  140/62    Blood Pressure (Exercise)  156/64    Blood Pressure (Exit)  128/60    Heart Rate (Admit)  71 bpm    Heart Rate (Exercise)  89 bpm    Heart Rate (Exit)  63 bpm    Rating of Perceived Exertion (Exercise)  15    Symptoms  none    Duration  Continue with 30 min of aerobic exercise without signs/symptoms of physical distress.    Intensity  THRR unchanged      Progression   Progression  Continue to progress workloads to maintain intensity without signs/symptoms of physical distress.    Average METs  2.14      Resistance Training   Training Prescription  Yes    Weight  3 lbs    Reps  10-15      Interval Training   Interval Training  No      Treadmill   MPH  1    Grade  0.5    Minutes  15    METs  1.83      Recumbant Bike   Level  1    Watts  18    Minutes  15    METs  2.71      NuStep   Level  4    Minutes  15    METs  2.1      Home  Exercise Plan   Plans to continue exercise at  [employer] (comment)   [employer] under husband, walking   Frequency  Add 3 additional days to program exercise sessions.    Initial Home Exercises Provided  07/20/18       Functional Capacity: 6 Minute Walk    Row Name 09/07/18 1526         6 Minute Walk   Phase  Discharge     Distance  1200 feet     Distance % Change  17.6 %     Distance Feet Change  180 ft     Walk Time  6 minutes     # of Rest Breaks  0     MPH  2.27     METS  2.84     RPE  17     Perceived Dyspnea   1     VO2 Peak  9.9     Symptoms  Yes (comment)     Comments  knee and hip pain 6/10  Resting HR  61 bpm     Resting BP  118/58     Exercise Oxygen Saturation  during 6 min walk  99 %     Max Ex. HR  115 bpm     Max Ex. BP  158/64        Psychological, QOL, Others - Outcomes: PHQ 2/9: Depression screen PHQ 2/9 07/05/2018  Decreased Interest 0  Down, Depressed, Hopeless 1  PHQ - 2 Score 1  Altered sleeping 0  Tired, decreased energy 3  Change in appetite 2  Feeling bad or failure about yourself  1  Trouble concentrating 0  Moving slowly or fidgety/restless 0  Suicidal thoughts 0  PHQ-9 Score 7  Difficult doing work/chores Not difficult at all    Quality of Life: Quality of Life - 01/19/19 1034      Quality of Life   Select  Quality of Life      Quality of Life Scores   Health/Function Pre  15 %    Health/Function Post  18.4 %    Health/Function % Change  22.67 %    Socioeconomic Pre  11 %    Socioeconomic Post  17.21 %    Socioeconomic % Change   56.45 %    Psych/Spiritual Pre  19.71 %    Psych/Spiritual Post  14.57 %    Psych/Spiritual % Change  -26.08 %    Family Pre  14.4 %    Family Post  19.2 %    Family % Change  33.33 %    GLOBAL Pre  15.19 %    GLOBAL Post  17.49 %    GLOBAL % Change  15.14 %       Personal Goals: Goals established at orientation with interventions provided to work toward goal.    Personal Goals  Discharge: Goals and Risk Factor Review    Row Name 08/03/18 1031 09/07/18 1136 10/04/18 1147 10/25/18 1227 11/15/18 1252     Core Components/Risk Factors/Patient Goals Review   Personal Goals Review  Weight Management/Obesity;Diabetes;Hypertension;Tobacco Cessation  Weight Management/Obesity;Lipids;Hypertension;Diabetes;Tobacco Cessation  Weight Management/Obesity;Lipids;Hypertension;Diabetes;Tobacco Cessation  Weight Management/Obesity;Lipids;Hypertension;Diabetes;Tobacco Cessation  Weight Management/Obesity;Lipids;Hypertension;Diabetes;Tobacco Cessation   Review  Karen Potts is trying to quit smoking. She may smoke a cigarrette one every couple days. Her husband gets on her nerves and sometimes she will have one. Her heart attack was the main factor that has helped to start quitting. Explained to patient certain ways to quit smoking and triggers to stay away from.   Karen Potts is still trying to quit smoking. She has lost weight from 294 lbs down to 287 lbs. She attributes that to moving more and eating healthier. Her blood sugar readings are good. She takes it in the morning every other day. She does not have a blood pressure cuff at home. It is in storage. She takes her cholesterol medication as prescribed and is having bloodwork March 25th.   Karen Potts is doing well in rehab. She is still working on the smoking, but the stress is making it hard for to quit just yet.  She had a follow up over the phone with her doctor and got a good report.  She has not been checking her pressures as she does not have a cuff at home.  She is knows to watch her fluid and keep moving as much as possible to help with all her numbers.  The stress is also causing her to stress eat and she nows she needs to cut back  on the unhealthy snacking as it is making her swell and gain weight.   Karen Potts has been doing okay at home.  She is still smoking with all of her stress.  Her weight has gone up 4 lbs over the last month from her stress eating.  She knows  she needs to cut back.  She continues to keep an eye on her sugars as well.    Karen Potts is doing the best she can.  She is still smoking some with her stress, but hopes to be able to quit once we get back to class.  She is doing the best she can and will now start to work on working on herself.    Expected Outcomes  Short: go a whole week without smoking. Long: quit smoking for good.   Short: quit smoking. Long: Ask doctor about having bloodwork more regularly. Continue taking medications as prescribed. Either find blood pressure cuff or inquire about a way to get one so she can do home checks after graduation.  Short: Continue to watch weight and cut back on snacking.   Long; Conitnue to work on quitting smoking.   Short: Work on weight loss and cut back on snacking.  Long: Continue to monitor risk factors.   Short: Try to lose weight.  Long: Continue to monitor risk factors.    [place] Name 12/07/18 1221             Core Components/Risk Factors/Patient Goals Review   Personal Goals Review  Weight Management/Obesity;Lipids;Hypertension;Diabetes;Tobacco Cessation       Review  Karen Potts is managing. She feels more like herself and trying to walk more and eat better.         Expected Outcomes  Short: Try to lose weight.  Long: Continue to monitor risk factors.           Exercise Goals and Review:   Exercise Goals Re-Evaluation: Exercise Goals Re-Evaluation    Row Name 08/03/18 1023 08/18/18 1606 09/01/18 1422 09/07/18 1116 09/15/18 1154     Exercise Goal Re-Evaluation   Exercise Goals Review  Increase Physical Activity;Increase Strength and Stamina  Increase Physical Activity;Increase Strength and Stamina;Understanding of Exercise Prescription  Increase Physical Activity;Increase Strength and Stamina;Understanding of Exercise Prescription  Increase Physical Activity;Able to understand and use rate of perceived exertion (RPE) scale;Knowledge and understanding of Target Heart Rate Range (THRR);Understanding of  Exercise Prescription;Increase Strength and Stamina  Increase Physical Activity;Increase Strength and Stamina;Understanding of Exercise Prescription   Comments  Karen Potts has not been able to find out about the [employer]. She plans on joining when she is done with the program. Patient states that she walks everyday even though it may not be much.  Karen Potts continues to do well in rehab.  She is up to 1.0 mph on the treadmill now!  We will continue to monitor her progress.   Karen Potts has been doing well in rehab.  She wanted to keeping trying to use the recumbent bike, but her knee would continue to bother her so we switched her to the arm crank permanently.  We will continue to monitor her progress.   Karen Potts has noticed that she is moving a lot more at home since she started Cardiac Rehab. She has increased to Level 4 on the Nustep. She has an injection in her knee last week and it has helped a lot with knee pain. She hopes to [facility] at the [facility] after graduation.   Karen Potts continues to do well in  rehab.  She is moving some at home and trying to walk in her room.  She misses [place] and looks forward to getting her videos.  We will continue to monitor her progress at home.    Expected Outcomes  Short: join a gym after [employer]. Long: maintain exercise post HeartTrack independently.  Short: Continue to increase workloads.  Long: Ask about [employer] for outside class exercise.   Short: Increase arm crank workload.  Long: Continue walk more at home.   Short: Increase workoads on Nustep, begin using treadmill again since knee is feeling better. Long: Continue exercising after graduating at the [facility].  Short: Continue to walk and use videos at home. Long: Continue to increase strength.    [place] Name 10/04/18 1150 10/25/18 1212 11/15/18 1249 12/07/18 1216       Exercise Goal Re-Evaluation   Exercise Goals Review  Increase Physical Activity;Increase Strength and Stamina;Understanding of Exercise Prescription  Increase  Physical Activity;Increase Strength and Stamina;Understanding of Exercise Prescription  Increase Physical Activity;Increase Strength and Stamina;Understanding of Exercise Prescription  Increase Physical Activity;Increase Strength and Stamina;Understanding of Exercise Prescription    Comments  Karen Potts has been exercising at home.  She is not getting out to walk due to the pollen, but she is doing the videos that we sent out each day.  I encouraged her to continue to use the vidoes to keep up her stamina.  Her kids are also encouraging her to stay active as well.   Karen Potts is doing well with her exercise. She gets out to walk at least 3-4x a week.  She continues to use our videos as well.  She is starting to get bored in her room.  She wants to maintain her stamina as much as possible.  Karen Potts has been doing some exercises.  She has tried the videos and does her weights and stretching each day. She is going to try to get back into the routine of walking again, for her head and health.    She wants to be able to get moving.   Karen Potts continues to keep moving in her room. She has been walking the complex some too. She also stretches and does her weights daily.  Her hip and knee have been bothering her again. We talked about icing and staying active.     Expected Outcomes  Short: Continue to use vidoes each day.  Long: Continue to rebuild stamina.   Short: Continue to get out to walk and use videos.  Long: Continue to build up strength and stamina.   Short: Get back into routine of walking again.  Long: Continue to rebuild strength and stamina.   Short: Continue to walk and ice knee.  Long: Continue to maintain stamina.        Nutrition & Weight - Outcomes:  Post Biometrics - 09/07/18 1530       Post  Biometrics   Height  5' 8.5" (1.74 m)    Weight  287 lb (130.2 kg)    Waist Circumference  46 inches    Hip Circumference  56 inches    Waist to Hip Ratio  0.82 %    BMI (Calculated)  43    Single Leg Stand  17.12 seconds        Nutrition:   Nutrition Discharge: Nutrition Assessments - 01/19/19 1036      MEDFICTS Scores   Pre Score  41    Post Score  41    Score Difference  0  Education Questionnaire Score: Knowledge Questionnaire Score - 01/19/19 1036      Knowledge Questionnaire Score   Pre Score  20    Post Score  23/26       Goals reviewed with patient; copy given to patient.

## 2019-01-20 NOTE — Progress Notes (Signed)
Cardiac Individual Treatment Plan  Patient Details  Name: Karen Potts MRN: [id number] Date of Birth: Nov 16, 1960 Referring Provider:     Cardiac Rehab from 07/05/2018 in [facility] Cardiac and Pulmonary Rehab  Referring Provider  Isaias Cowman MD      Initial Encounter Date:    Cardiac Rehab from 07/05/2018 in [facility] Cardiac and Pulmonary Rehab  Date  07/05/18      Visit Diagnosis: NSTEMI (non-ST elevated myocardial infarction) [facility])  Status post coronary artery stent placement  Patient's Home Medications on Admission:  Current Outpatient Medications:  .  atorvastatin (LIPITOR) 80 MG tablet, Take 1 tablet (80 mg total) by mouth daily at 6 PM. (Patient not taking: Reported on 07/05/2018), Disp: 30 tablet, Rfl: 0 .  cyclobenzaprine (FLEXERIL) 10 MG tablet, cyclobenzaprine 10 mg tablet  Take 1 tablet every 8 hours by oral route for 3 days., Disp: , Rfl:  .  diazepam (VALIUM) 5 MG tablet, Take 1 tablet (5 mg total) by mouth every 8 (eight) hours as needed (dizziness)., Disp: 30 tablet, Rfl: 0 .  lisinopril-hydrochlorothiazide (PRINZIDE,ZESTORETIC) 10-12.5 MG tablet, TAKE 1 TABLET BY MOUTH ONCE DAILY, Disp: 90 tablet, Rfl: 1 .  loratadine (CLARITIN) 10 MG tablet, Take by mouth., Disp: , Rfl:  .  meclizine (ANTIVERT) 12.5 MG tablet, Take 1 tablet (12.5 mg total) by mouth 3 (three) times daily as needed for dizziness or nausea. (Patient not taking: Reported on 07/05/2018), Disp: 30 tablet, Rfl: 1 .  meclizine (ANTIVERT) 25 MG tablet, Take 1 tablet (25 mg total) by mouth 3 (three) times daily as needed for dizziness., Disp: 30 tablet, Rfl: 0 .  meloxicam (MOBIC) 15 MG tablet, meloxicam 15 mg tablet, Disp: , Rfl:  .  metFORMIN (GLUCOPHAGE) 500 MG tablet, Take 500 mg by mouth 2 (two) times daily with a meal., Disp: , Rfl:  .  metFORMIN (GLUCOPHAGE) 500 MG tablet, Take by mouth., Disp: , Rfl:  .  nitroGLYCERIN (NITROSTAT) 0.4 MG SL tablet, Place 1 tablet (0.4 mg total) under the tongue every 5  (five) minutes as needed for chest pain., Disp: 30 tablet, Rfl: 12 .  prasugrel (EFFIENT) 10 MG TABS tablet, Take by mouth., Disp: , Rfl:   Past Medical History: Past Medical History:  Diagnosis Date  . Diabetes mellitus without complication ([place])   . Hypertension   . Sleep apnea   . Vertigo     Tobacco Use: Social History   Tobacco Use  Smoking Status Former Smoker  . Types: Cigarettes  . Quit date: 02/24/2018  . Years since quitting: 0.9  Smokeless Tobacco Never Used  Tobacco Comment   Jami said she quit smoking the day of her heart attack.    Labs: Recent Review Flowsheet Data    Labs for ITP Cardiac and Pulmonary Rehab Latest Ref Rng & Units 02/24/2018   Cholestrol 0 - 200 mg/dL 228(H)   LDLCALC 0 - 99 mg/dL 165(H)   HDL >40 mg/dL 36(L)   Trlycerides <150 mg/dL 134   Hemoglobin A1c 4.8 - 5.6 % 6.4(H)       Exercise Target Goals: Exercise Program Goal: Individual exercise prescription set using results from initial 6 min walk test and THRR while considering  patient's activity barriers and safety.   Exercise Prescription Goal: Initial exercise prescription builds to 30-45 minutes a day of aerobic activity, 2-3 days per week.  Home exercise guidelines will be given to patient during program as part of exercise prescription that the participant will acknowledge.  Activity  Barriers & Risk Stratification:   6 Minute Walk: 6 Minute Walk    Row Name 09/07/18 1526         6 Minute Walk   Phase  Discharge     Distance  1200 feet     Distance % Change  17.6 %     Distance Feet Change  180 ft     Walk Time  6 minutes     # of Rest Breaks  0     MPH  2.27     METS  2.84     RPE  17     Perceived Dyspnea   1     VO2 Peak  9.9     Symptoms  Yes (comment)     Comments  knee and hip pain 6/10     Resting HR  61 bpm     Resting BP  118/58     Exercise Oxygen Saturation  during 6 min walk  99 %     Max Ex. HR  115 bpm     Max Ex. BP  158/64        Oxygen  Initial Assessment:   Oxygen Re-Evaluation:   Oxygen Discharge (Final Oxygen Re-Evaluation):   Initial Exercise Prescription:   Perform Capillary Blood Glucose checks as needed.  Exercise Prescription Changes: Exercise Prescription Changes    Row Name 08/03/18 1400 08/18/18 1600 09/01/18 1400 09/15/18 1100       Response to Exercise   Blood Pressure (Admit)  134/70  142/78  116/62  140/62    Blood Pressure (Exercise)  132/76  164/74  154/64  156/64    Blood Pressure (Exit)  112/58  140/70  122/56  128/60    Heart Rate (Admit)  79 bpm  69 bpm  97 bpm  71 bpm    Heart Rate (Exercise)  94 bpm  113 bpm  98 bpm  89 bpm    Heart Rate (Exit)  65 bpm  63 bpm  71 bpm  63 bpm    Rating of Perceived Exertion (Exercise)  15  17  13  15     Symptoms  none  none  none  none    Duration  Continue with 30 min of aerobic exercise without signs/symptoms of physical distress.  Continue with 30 min of aerobic exercise without signs/symptoms of physical distress.  Continue with 30 min of aerobic exercise without signs/symptoms of physical distress.  Continue with 30 min of aerobic exercise without signs/symptoms of physical distress.    Intensity  THRR unchanged  THRR unchanged  THRR unchanged  THRR unchanged      Progression   Progression  Continue to progress workloads to maintain intensity without signs/symptoms of physical distress.  Continue to progress workloads to maintain intensity without signs/symptoms of physical distress.  Continue to progress workloads to maintain intensity without signs/symptoms of physical distress.  Continue to progress workloads to maintain intensity without signs/symptoms of physical distress.    Average METs  2.06  2.07  2.11  2.14      Resistance Training   Training Prescription  Yes  Yes  Yes  Yes    Weight  3 lbs  3 lbs  3 lbs  3 lbs    Reps  10-15  10-15  10-15  10-15      Interval Training   Interval Training  No  No  No  No      Treadmill   MPH  0.5  1   1  1     Grade  0.5  0.5  0.5  0.5    Minutes  15  15  15  15     METs  1.4  1.83  1.83  1.83      Recumbant Bike   Level  -  -  -  1    Watts  -  -  -  18    Minutes  -  -  -  15    METs  -  -  -  2.71      NuStep   Level  3  4  4  4     Minutes  15  15  15  15     METs  2.8  2.2  2.3  2.1      Arm Ergometer   Level  1  1  1   -    Minutes  15  15  15   -    METs  2  2.2  2.2  -      Home Exercise Plan   Plans to continue exercise at  [employer] (comment) [employer] under husband, walking  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  Add 3 additional days to program exercise sessions.  Add 3 additional days to program exercise sessions.  Add 3 additional days to program exercise sessions.    Initial Home Exercises Provided  07/20/18  07/20/18  07/20/18  07/20/18       Exercise Comments: Exercise Comments    Row Name 09/02/18 1225 01/20/19 0936         Exercise Comments  [name] got dizzy and lightheaded on treadmill. Her pressure was 158/84 and sugar was 120.  She felt better after and sitting an drinking some water.  She was able to get up and continue to walk on track for exercise.   Pixie graduated today from  rehab with 36 sessions completed.  Details of the patient's exercise prescription and what She needs to do in order to continue the prescription and progress were discussed with patient.  Patient was given a copy of prescription and goals.  Patient verbalized understanding.  Tywana plans to continue to exercise by walking and using videos at home.         Exercise Goals and Review:   Exercise Goals Re-Evaluation : Exercise Goals Re-Evaluation    Row Name 08/03/18 1023 08/18/18 1606 09/01/18 1422 09/07/18 1116 09/15/18 1154     Exercise Goal Re-Evaluation   Exercise Goals  Review  Increase Physical Activity;Increase Strength and Stamina  Increase Physical Activity;Increase Strength and Stamina;Understanding of Exercise Prescription  Increase Physical Activity;Increase Strength and Stamina;Understanding of Exercise Prescription  Increase Physical Activity;Able to understand and use rate of perceived exertion (RPE) scale;Knowledge and understanding of Target Heart Rate Range (THRR);Understanding of Exercise Prescription;Increase Strength and Stamina  Increase Physical Activity;Increase Strength and Stamina;Understanding of Exercise Prescription   Comments  [name] has not been able to find out about the [employer]. She plans on joining when she is done with the program. Patient states that she walks everyday even though it may not be much.  [name] continues to do well in rehab.  She is up to 1.0 mph on the treadmill now!  We will continue to monitor her progress.  [name] has been doing well in rehab.  She wanted to keeping trying to use the recumbent bike, but her knee would continue to bother her so we switched her to the arm crank permanently.  We will continue to monitor her progress.   [name] has noticed that she is moving a lot more at home since she started Cardiac Rehab. She has increased to Level 4 on the Nustep. She has an injection in her knee last week and it has helped a lot with knee pain. She hopes to continue exercising at the [facility] after graduation.   [name] continues to do well in rehab.  She is moving some at home and trying to walk in her room.  She misses [place] and looks forward to getting her videos.  We will continue to monitor her progress at home.    Expected Outcomes  Short: join a gym after [employer]. Long: maintain exercise post [employer] independently.  Short: Continue to increase workloads.  Long: Ask about [employer] for outside class exercise.   Short: Increase arm crank workload.  Long: Continue walk more at home.   Short: Increase workoads on Nustep, begin  using treadmill again since knee is feeling better. Long: Continue exercising after graduating at the [facility].  Short: Continue to walk and use videos at home. Long: Continue to increase strength.    [place] Name 10/04/18 1150 10/25/18 1212 11/15/18 1249 12/07/18 1216       Exercise Goal Re-Evaluation   Exercise Goals Review  Increase Physical Activity;Increase Strength and Stamina;Understanding of Exercise Prescription  Increase Physical Activity;Increase Strength and Stamina;Understanding of Exercise Prescription  Increase Physical Activity;Increase Strength and Stamina;Understanding of Exercise Prescription  Increase Physical Activity;Increase Strength and Stamina;Understanding of Exercise Prescription    Comments  [name] has been exercising at home.  She is not getting out to walk due to the pollen, but she is doing the videos that we sent out each day.  I encouraged her to continue to use the vidoes to keep up her stamina.  Her kids are also encouraging her to stay active as well.   [name] is doing well with her exercise. She gets out to walk at least 3-4x a week.  She continues to use our videos as well.  She is starting to get bored in her room.  She wants to maintain her stamina as much as possible.  [name] has been doing some exercises.  She has tried the videos and does her weights and stretching each day. She is going to try to get back into the routine of walking again, for her head and health.    She wants to be able to get moving.   [name] continues to keep moving in her room. She has been walking the complex some too. She also stretches and does her weights daily.  Her hip and knee have been bothering her again. We talked about icing and staying active.     Expected Outcomes  Short: Continue to use vidoes each day.  Long: Continue to rebuild stamina.   Short: Continue to get out to walk and use videos.  Long: Continue to build up strength and stamina.   Short: Get back into routine of walking again.  Long:  Continue to rebuild strength and stamina.   Short: Continue to walk and ice knee.  Long: Continue to maintain stamina.        Discharge Exercise Prescription (Final Exercise Prescription Changes): Exercise Prescription Changes - 09/15/18 1100  Response to Exercise   Blood Pressure (Admit)  140/62    Blood Pressure (Exercise)  156/64    Blood Pressure (Exit)  128/60    Heart Rate (Admit)  71 bpm    Heart Rate (Exercise)  89 bpm    Heart Rate (Exit)  63 bpm    Rating of Perceived Exertion (Exercise)  15    Symptoms  none    Duration  Continue with 30 min of aerobic exercise without signs/symptoms of physical distress.    Intensity  THRR unchanged      Progression   Progression  Continue to progress workloads to maintain intensity without signs/symptoms of physical distress.    Average METs  2.14      Resistance Training   Training Prescription  Yes    Weight  3 lbs    Reps  10-15      Interval Training   Interval Training  No      Treadmill   MPH  1    Grade  0.5    Minutes  15    METs  1.83      Recumbant Bike   Level  1    Watts  18    Minutes  15    METs  2.71      NuStep   Level  4    Minutes  15    METs  2.1      Home Exercise Plan   Plans to continue exercise at  [employer] (comment)   [employer] under husband, walking   Frequency  Add 3 additional days to program exercise sessions.    Initial Home Exercises Provided  07/20/18       Nutrition:  Target Goals: Understanding of nutrition guidelines, daily intake of sodium <157m, cholesterol <2053m calories 30% from fat and 7% or less from saturated fats, daily to have 5 or more servings of fruits and vegetables.  Biometrics:  Post Biometrics - 09/07/18 1530       Post  Biometrics   Height  5' 8.5" (1.74 m)    Weight  287 lb (130.2 kg)    Waist Circumference  46 inches    Hip Circumference  56 inches    Waist to Hip Ratio  0.82 %    BMI (Calculated)  43    Single Leg Stand  17.12 seconds        Nutrition Therapy Plan and Nutrition Goals:   Nutrition Assessments: Nutrition Assessments - 01/19/19 1036      MEDFICTS Scores   Pre Score  41    Post Score  41    Score Difference  0       Nutrition Goals Re-Evaluation: Nutrition Goals Re-Evaluation    Row Name 08/03/18 1027 09/28/18 1414 01/06/19 1133         Goals   Current Weight  292 lb (132.5 kg)  -  -     Nutrition Goal  Lose weight and eat healthier.   Lose weight and eat healthier  [facility]LT[facility]     Comment  [name] has made a few changes in her diet. She does not eat fried food. She eats more yogurt and drinks more water. She still has a habit with drinking sodas.   chicken soup and crackers, B: special K with strawberries cereal 2% milk and oatmeal. Biggest problem is with sodas: ~20oz/day mountain dew - adds water half, gets regular.  drinks ~2 16oz/day.  canned vegetables rinse and drain and cook it with water. no cured meats or deli meat, but will have [place] breast.  grilled cheeses without butter, trying to stay away from butter.   Pt husband passed away in Dec 17, 2022, pt still dealing with loss and is not eating as healthy, but still is trying to manage changes she has made before his passing. Pt still thinks soda is her biggest problem, continues to water down, but will sometimes drink it on its own, we discussed pairing carbohydrates like soda with healthy protein and fat to abvoid BG spikes. Pt is not taking BG     Expected Outcome  Short; lose 5 pounds the next two weeks. Long: make healthier eating choices independently.  ST: continue to lose weight LT: continue with healthy eating journey and making small changes  Get back on track with [facility] eating with life stressors        Nutrition Goals Discharge (Final Nutrition Goals Re-Evaluation): Nutrition Goals Re-Evaluation - 01/06/19 1133      Goals   Nutrition Goal  ST/LT: get back on track with [facility] eating with life stressors     Comment  Pt husband passed away in December 17, 2022, pt still dealing with loss and is not eating as healthy, but still is trying to manage changes she has made before his passing. Pt still thinks soda is her biggest problem, continues to water down, but will sometimes drink it on its own, we discussed pairing carbohydrates like soda with healthy protein and fat to abvoid BG spikes. Pt is not taking BG    Expected Outcome  Get back on track with [facility] eating with life stressors       Psychosocial: Target Goals: Acknowledge presence or absence of significant depression and/or stress, maximize coping skills, provide positive support system. Participant is able to verbalize types and ability to use techniques and skills needed for reducing stress and depression.   Initial Review & Psychosocial Screening:   Quality of Life Scores:  Quality of Life - 01/19/19 1034      Quality of Life   Select  Quality of Life      Quality of Life Scores   Health/Function Pre  15 %    Health/Function Post  18.4 %    Health/Function % Change  22.67 %    Socioeconomic Pre  11 %    Socioeconomic Post  17.21 %    Socioeconomic % Change   56.45 %    Psych/Spiritual Pre  19.71 %    Psych/Spiritual Post  14.57 %    Psych/Spiritual % Change  -26.08 %    Family Pre  14.4 %    Family Post  19.2 %    Family % Change  33.33 %    GLOBAL Pre  15.19 %    GLOBAL Post  17.49 %    GLOBAL % Change  15.14 %      Scores of 19 and below usually indicate a poorer quality of life in these areas.  A difference of  2-3 points is a clinically meaningful difference.  A difference of 2-3 points in the total score of the Quality of Life Index has been associated with significant improvement in overall quality of life, self-image, physical symptoms, and general health in studies assessing change in quality of life.  PHQ-9: Recent Review Flowsheet Data    Depression screen [facility] 2/9 07/05/2018   Decreased Interest 0   Down, Depressed, Hopeless  1    PHQ - 2 Score 1   Altered sleeping 0   Tired, decreased energy 3   Change in appetite 2   Feeling bad or failure about yourself  1   Trouble concentrating 0   Moving slowly or fidgety/restless 0   Suicidal thoughts 0   PHQ-9 Score 7   Difficult doing work/chores Not difficult at all     Interpretation of Total Score  Total Score Depression Severity:  1-4 = Minimal depression, 5-9 = Mild depression, 10-14 = Moderate depression, 15-19 = Moderately severe depression, 20-27 = Severe depression   Psychosocial Evaluation and Intervention:   Psychosocial Re-Evaluation: Psychosocial Re-Evaluation    [place] Name 08/12/18 1738 08/31/18 1044 09/07/18 1128 10/04/18 1144 10/25/18 1219     Psychosocial Re-Evaluation   Current issues with  Current Stress Concerns;Current Anxiety/Panic  -  Current Stress Concerns;Current Sleep Concerns  Current Stress Concerns;Current Sleep Concerns  Current Stress Concerns;Current Sleep Concerns;Current Depression   Comments  Counselor follow up with [name] today reporting ongoing problems with spouse who has been transferred to a LT care facility locally and is "bothering her" constantly about visiting and doing things for him.  [name] is struggling with her own health issues and was experiencing back pain today and some nausea as well.  Counselor explored with [name] ways to set limits and boundaries for improved self-care.  [name] practiced assertive statements with counselor and reported feeling empowered and less pain in her back at the end of our time together. Counselor will follow with [name] next week.  Counselor also facilitated staff providing [name] with a gas card to help with expenses since she has no income currently and is struggling financially.    Given gas card today  [name] reports that she is sleeping well. She says she is so happy. Her family is a stressor but that situation is currently okay. Her husband is in a long term care facility.   [name] has been doing well at home.   Her kids call her daily to check up on her.  Her husband is making her crazy calling mulitple times a day since he has no visitors in his facility.  She is trying to cope with him as best she can. She is using her exercise to help as well.  She has been sleeping better and looks forward to our emails each day!!  [name] is doing okay.  Her kids are not calling or talking with her as frequently.  Her husband continues to make her crazy and makes odd request for her such as checking on his cars.  She continues to get frustrated with her housing situation, as she was approved for housing but with everything closed down, she can't move forward.  She is stressed about how things keep happening to her.  She started with shoulders, then knees, and then her heart.  She also has been having a rash on her legs and now it has started to spread and still having vertigo.  Her doctors keep defering to the other specialist.  I encouraged her to call her PCP to be seen, even if a virtual visit to show the rash and talk about how she is feeling.  I have also reached out to our conselor to get in touch with her to check up.  Some good news, she has gotten her Medicaid back during all of this and can get some of her medical bills covered this way.    Expected Outcomes  Short:  [name] will set healthy boundaries and limits with others (particularly her spouse).  [name] will exercise for her health and as a stress reducer - for her mental health.  Long:  [name] will continue to practice assertiveness with others and develop positive self-care strategies - including exercise consistently.   -  Short: Take care of [name]. Long: Continue exercising for stress reduction and find something that she enjoys to do for fun. Long: Look for resources when problems do arise in the future and continue taking care of her health.  Short: Continue to practice self care and reach out to kids.  Long: Continue to exercise for stress management.   Short: Continue to  exercise for the mood boost. Talk to PCP.  Long: Continue to practice self care.    Interventions  -  -  -  Stress management education;Encouraged to attend Cardiac Rehabilitation for the exercise  Stress management education;Encouraged to attend Cardiac Rehabilitation for the exercise   Continue Psychosocial Services   Follow up required by counselor  -  -  Follow up required by staff  Follow up required by staff     Initial Review   Source of Stress Concerns  -  Transportation  -  -  -   [address] Name 11/15/18 1254 12/07/18 1219           Psychosocial Re-Evaluation   Current issues with  Current Stress Concerns;Current Sleep Concerns;Current Depression  Current Stress Concerns;Current Sleep Concerns;Current Depression      Comments  [name] is doing okay. She is still in shock.  She lost her husband on Mother's Day.  They buried him this past Saturday.  Her family and kids were all able ot be there!  She was happy to have everyone together for the grave side service.  She is now worried about money and getting into a home/apartment. She has moments of weepiness.  She has been caring for others so long she now feels a little lost. However, she has come to the realization that she now needs to take care of herself and make herself the priority.  She feels that God has put her right where she needs to be.  Her kids have been checking up on her and watching her closely.  Our conselor will be reaching out to her this week as well.  She is working with a case worker for housing but things are still on hold with pandemic and now she is awaiting on insurance as well.  We talked about making sure she takes the time to walk to help clear her head and for the mood boost.  Since her husband passes, she has not been able to sleep well and even took a valuim one night to get to sleep.  She will continue to work on herself.   [name] is holding up okay.  She still has moments of weepiness, but her family has continued to check  in on her daily.  She is working with case workers on her disabillty and her husband's benefits.  Sheis doing the best she can, but she is sleeping a little better.       Expected Outcomes  Short: Get out to walk for her head and heart.  Long: Continue to focus on herself now.   Short: Continue to get out of room to clear her head.  Long: Continue to practice self care.       Interventions  Stress management education;Encouraged to attend Cardiac  Rehabilitation for the exercise  Stress management education;Encouraged to attend Cardiac Rehabilitation for the exercise      Continue Psychosocial Services   Follow up required by staff  Follow up required by staff         Psychosocial Discharge (Final Psychosocial Re-Evaluation): Psychosocial Re-Evaluation - 12/07/18 1219      Psychosocial Re-Evaluation   Current issues with  Current Stress Concerns;Current Sleep Concerns;Current Depression    Comments  [name] is holding up okay.  She still has moments of weepiness, but her family has continued to check in on her daily.  She is working with case workers on her disabillty and her husband's benefits.  Sheis doing the best she can, but she is sleeping a little better.     Expected Outcomes  Short: Continue to get out of room to clear her head.  Long: Continue to practice self care.     Interventions  Stress management education;Encouraged to attend Cardiac Rehabilitation for the exercise    Continue Psychosocial Services   Follow up required by staff       Vocational Rehabilitation: Provide vocational rehab assistance to qualifying candidates.   Vocational Rehab Evaluation & Intervention:   Education: Education Goals: Education classes will be provided on a variety of topics geared toward better understanding of heart health and risk factor modification. Participant will state understanding/return demonstration of topics presented as noted by education test scores.  Learning  Barriers/Preferences:   Education Topics:  AED/CPR: - Group verbal and written instruction with the use of models to demonstrate the basic use of the AED with the basic ABC's of resuscitation.   General Nutrition Guidelines/Fats and Fiber: -Group instruction provided by verbal, written material, models and posters to present the general guidelines for heart healthy nutrition. Gives an explanation and review of dietary fats and fiber.   Cardiac Rehab from 09/09/2018 in [facility] Cardiac and Pulmonary Rehab  Date  09/07/18  Educator  [facility]  Instruction Review Code  1- Verbalizes Understanding      Controlling Sodium/Reading Food Labels: -Group verbal and written material supporting the discussion of sodium use in heart healthy nutrition. Review and explanation with models, verbal and written materials for utilization of the food label.   Cardiac Rehab from 09/09/2018 in [facility] Cardiac and Pulmonary Rehab  Date  09/09/18  Educator  [facility]  Instruction Review Code  1- Verbalizes Understanding      Exercise Physiology & General Exercise Guidelines: - Group verbal and written instruction with models to review the exercise physiology of the cardiovascular system and associated critical values. Provides general exercise guidelines with specific guidelines to those with heart or lung disease.    Cardiac Rehab from 09/09/2018 in [facility] Cardiac and Pulmonary Rehab  Date  07/20/18  Educator  [facility]  Instruction Review Code  1- Verbalizes Understanding      Aerobic Exercise & Resistance Training: - Gives group verbal and written instruction on the various components of exercise. Focuses on aerobic and resistive training programs and the benefits of this training and how to safely progress through these programs..   Cardiac Rehab from 09/09/2018 in [facility] Cardiac and Pulmonary Rehab  Date  07/22/18  Educator  [place]  Instruction Review Code  1- Verbalizes Understanding      Flexibility, Balance, Mind/Body  Relaxation: Provides group verbal/written instruction on the benefits of flexibility and balance training, including mind/body exercise modes such as yoga, pilates and tai chi.  Demonstration and skill practice provided.   Cardiac  Rehab from 09/09/2018 in [facility] Cardiac and Pulmonary Rehab  Date  07/27/18  Educator  AS  Instruction Review Code  1- Verbalizes Understanding      Stress and Anxiety: - Provides group verbal and written instruction about the health risks of elevated stress and causes of high stress.  Discuss the correlation between heart/lung disease and anxiety and treatment options. Review healthy ways to manage with stress and anxiety.   Cardiac Rehab from 09/09/2018 in [facility] Cardiac and Pulmonary Rehab  Date  08/17/18  Educator  [facility]  Instruction Review Code  1- Verbalizes Understanding      Depression: - Provides group verbal and written instruction on the correlation between heart/lung disease and depressed mood, treatment options, and the stigmas associated with seeking treatment.   Cardiac Rehab from 09/09/2018 in [facility] Cardiac and Pulmonary Rehab  Date  08/31/18  Educator  [facility]  Instruction Review Code  1- Verbalizes Understanding      Anatomy & Physiology of the Heart: - Group verbal and written instruction and models provide basic cardiac anatomy and physiology, with the coronary electrical and arterial systems. Review of Valvular disease and Heart Failure   Cardiac Procedures: - Group verbal and written instruction to review commonly prescribed medications for heart disease. Reviews the medication, class of the drug, and side effects. Includes the steps to properly store meds and maintain the prescription regimen. (beta blockers and nitrates)   Cardiac Rehab from 09/09/2018 in [facility] Cardiac and Pulmonary Rehab  Date  08/19/18  Educator  CE  Instruction Review Code  1- Verbalizes Understanding      Cardiac Medications I: - Group verbal and written instruction to  review commonly prescribed medications for heart disease. Reviews the medication, class of the drug, and side effects. Includes the steps to properly store meds and maintain the prescription regimen.   Cardiac Rehab from 09/09/2018 in [facility] Cardiac and Pulmonary Rehab  Date  08/10/18  Educator  SB  Instruction Review Code  1- Verbalizes Understanding      Cardiac Medications II: -Group verbal and written instruction to review commonly prescribed medications for heart disease. Reviews the medication, class of the drug, and side effects. (all other drug classes)   Cardiac Rehab from 09/09/2018 in [facility] Cardiac and Pulmonary Rehab  Date  09/02/18  Educator  CE  Instruction Review Code  1- Verbalizes Understanding       Go Sex-Intimacy & Heart Disease, Get SMART - Goal Setting: - Group verbal and written instruction through game format to discuss heart disease and the return to sexual intimacy. Provides group verbal and written material to discuss and apply goal setting through the application of the S.M.A.R.T. Method.   Cardiac Rehab from 09/09/2018 in [facility] Cardiac and Pulmonary Rehab  Date  08/19/18  Educator  CE  Instruction Review Code  1- Verbalizes Understanding      Other Matters of the Heart: - Provides group verbal, written materials and models to describe Stable Angina and Peripheral Artery. Includes description of the disease process and treatment options available to the cardiac patient.   Exercise & Equipment Safety: - Individual verbal instruction and demonstration of equipment use and safety with use of the equipment.   Cardiac Rehab from 09/09/2018 in [facility] Cardiac and Pulmonary Rehab  Date  07/05/18  Educator  Renita Papa, RN  Instruction Review Code  1- Verbalizes Understanding      Infection Prevention: - Provides verbal and written material to individual with discussion of infection control  including proper hand washing and proper equipment cleaning during  exercise session.   Cardiac Rehab from 09/09/2018 in [facility] Cardiac and Pulmonary Rehab  Date  07/05/18  Educator  Renita Papa, RN  Instruction Review Code  1- Verbalizes Understanding      Falls Prevention: - Provides verbal and written material to individual with discussion of falls prevention and safety.   Cardiac Rehab from 09/09/2018 in [facility] Cardiac and Pulmonary Rehab  Date  07/05/18  Educator  Renita Papa, RN  Instruction Review Code  1- Verbalizes Understanding      Diabetes: - Individual verbal and written instruction to review signs/symptoms of diabetes, desired ranges of glucose level fasting, after meals and with exercise. Acknowledge that pre and post exercise glucose checks will be done for 3 sessions at entry of program.   Know Your Numbers and Risk Factors: -Group verbal and written instruction about important numbers in your health.  Discussion of what are risk factors and how they play a role in the disease process.  Review of Cholesterol, Blood Pressure, Diabetes, and BMI and the role they play in your overall health.   Cardiac Rehab from 09/09/2018 in [facility] Cardiac and Pulmonary Rehab  Date  09/02/18  Educator  CE  Instruction Review Code  1- Verbalizes Understanding      Sleep Hygiene: -Provides group verbal and written instruction about how sleep can affect your health.  Define sleep hygiene, discuss sleep cycles and impact of sleep habits. Review good sleep hygiene tips.    Cardiac Rehab from 09/09/2018 in [facility] Cardiac and Pulmonary Rehab  Date  08/03/18  Educator  [facility]  Instruction Review Code  1- Verbalizes Understanding      Other: -Provides group and verbal instruction on various topics (see comments)   Knowledge Questionnaire Score: Knowledge Questionnaire Score - 01/19/19 1036      Knowledge Questionnaire Score   Pre Score  20    Post Score  23/26       Core Components/Risk Factors/Patient Goals at Admission:   Core Components/Risk  Factors/Patient Goals Review:  Goals and Risk Factor Review    Row Name 08/03/18 1031 09/07/18 1136 10/04/18 1147 10/25/18 1227 11/15/18 1252     Core Components/Risk Factors/Patient Goals Review   Personal Goals Review  Weight Management/Obesity;Diabetes;Hypertension;Tobacco Cessation  Weight Management/Obesity;Lipids;Hypertension;Diabetes;Tobacco Cessation  Weight Management/Obesity;Lipids;Hypertension;Diabetes;Tobacco Cessation  Weight Management/Obesity;Lipids;Hypertension;Diabetes;Tobacco Cessation  Weight Management/Obesity;Lipids;Hypertension;Diabetes;Tobacco Cessation   Review  [name] is trying to quit smoking. She may smoke a cigarrette one every couple days. Her husband gets on her nerves and sometimes she will have one. Her heart attack was the main factor that has helped to start quitting. Explained to patient certain ways to quit smoking and triggers to stay away from.   [name] is still trying to quit smoking. She has lost weight from 294 lbs down to 287 lbs. She attributes that to moving more and eating healthier. Her blood sugar readings are good. She takes it in the morning every other day. She does not have a blood pressure cuff at home. It is in storage. She takes her cholesterol medication as prescribed and is having bloodwork March 25th.   [name] is doing well in rehab. She is still working on the smoking, but the stress is making it hard for to quit just yet.  She had a follow up over the phone with her doctor and got a good report.  She has not been checking her pressures as she does not have a cuff at  home.  She is knows to watch her fluid and keep moving as much as possible to help with all her numbers.  The stress is also causing her to stress eat and she nows she needs to cut back on the unhealthy snacking as it is making her swell and gain weight.   [name] has been doing okay at home.  She is still smoking with all of her stress.  Her weight has gone up 4 lbs over the last month from her stress  eating.  She knows she needs to cut back.  She continues to keep an eye on her sugars as well.    [name] is doing the best she can.  She is still smoking some with her stress, but hopes to be able to quit once we get back to class.  She is doing the best she can and will now start to work on working on herself.    Expected Outcomes  Short: go a whole week without smoking. Long: quit smoking for good.   Short: quit smoking. Long: Ask doctor about having bloodwork more regularly. Continue taking medications as prescribed. Either find blood pressure cuff or inquire about a way to get one so she can do home checks after graduation.  Short: Continue to watch weight and cut back on snacking.   Long; Conitnue to work on quitting smoking.   Short: Work on weight loss and cut back on snacking.  Long: Continue to monitor risk factors.   Short: Try to lose weight.  Long: Continue to monitor risk factors.    [place] Name 12/07/18 1221             Core Components/Risk Factors/Patient Goals Review   Personal Goals Review  Weight Management/Obesity;Lipids;Hypertension;Diabetes;Tobacco Cessation       Review  [name] is managing. She feels more like herself and trying to walk more and eat better.         Expected Outcomes  Short: Try to lose weight.  Long: Continue to monitor risk factors.           Core Components/Risk Factors/Patient Goals at Discharge (Final Review):  Goals and Risk Factor Review - 12/07/18 1221      Core Components/Risk Factors/Patient Goals Review   Personal Goals Review  Weight Management/Obesity;Lipids;Hypertension;Diabetes;Tobacco Cessation    Review  [name] is managing. She feels more like herself and trying to walk more and eat better.      Expected Outcomes  Short: Try to lose weight.  Long: Continue to monitor risk factors.        ITP Comments: ITP Comments    Row Name 07/28/18 1354 08/25/18 0615 09/15/18 1154 09/22/18 1220 01/12/19 1420   ITP Comments  30 day review completed. ITP sent to  Dr. Emily Filbert, Medical Director of Cardiac Rehab. Continue with ITP unless changes are made by physician.  30 day review. Continue with ITP unless directed changes by Medical Director chart review.  Our program is currently closed due to COVID-19.  We are communicating with patient via phone calls and emails.    30 day review. Continue with ITP unless directed changes by Medical Director chart review.  30 day review cycle restarting  after being closed since March 16 because of  Covid 19 pandemic. Program opened to patients on July 6. Not all have returned. ITP updated and sent to Medical Director for review,changes as needed and signature   [place] Name 01/20/19 [phone]  ITP Comments  Discharge ITP sent and signed by Dr. Caryl Comes, covering for Medical Director Dr. Emily Filbert.  Discharge Summary routed to PCP and cardiologist.          Comments: Discharge ITP

## 2019-01-20 NOTE — Progress Notes (Signed)
Daily Session Note  Patient Details  Name: Karen Potts MRN: [id number] Date of Birth: Dec 25, 1960 Referring Provider:     Cardiac Rehab from 07/05/2018 in [facility] Cardiac and Pulmonary Rehab  Referring Provider  Isaias Cowman MD      Encounter Date: 01/20/2019  Check In: Session Check In - 01/20/19 0935      Check-In   Supervising physician immediately available to respond to emergencies  See telemetry face sheet for immediately available ER MD    Location  [facility]-Cardiac & Pulmonary Rehab    Staff Present  Alberteen Sam, MA, RCEP, CCRP, CCET;Jeanna Durrell BS, Exercise Physiologist;Susanne Bice, RN, BSN, CCRP    Virtual Visit  No    Medication changes reported      No    Fall or balance concerns reported     No    Warm-up and Cool-down  Performed on first and last piece of equipment    Resistance Training Performed  Yes    VAD Patient?  No    PAD/SET Patient?  No      Pain Assessment   Currently in Pain?  No/denies          Social History   Tobacco Use  Smoking Status Former Smoker  . Types: Cigarettes  . Quit date: 02/24/2018  . Years since quitting: 0.9  Smokeless Tobacco Never Used  Tobacco Comment   [name] said she quit smoking the day of her heart attack.    Goals Met:  Independence with exercise equipment Exercise tolerated well Personal goals reviewed No report of cardiac concerns or symptoms Strength training completed today  Goals Unmet:  Not Applicable  Comments:  [facility] graduated today from  rehab with 36 sessions completed.  Details of the patient's exercise prescription and what She needs to do in order to continue the prescription and progress were discussed with patient.  Patient was given a copy of prescription and goals.  Patient verbalized understanding.  Leidi plans to continue to exercise by walking and using videos at home.    Dr. Emily Filbert is Medical Director for [place] and [facility]  [facility].

## 2019-01-25 ENCOUNTER — Ambulatory Visit: Payer: Medicaid Other

## 2019-01-25 NOTE — Progress Notes (Signed)
Counselor contacted patient for ongoing follow up.  Patient reported completion of program last Thursday and how she would miss the structure and people.  Exploration of supports and resources available to patient moving forward.  Conversation regarding differences in current grief experience with husband's death and grief experienced with mother's death In the past.  Counselor spoke of possible supports available through [employer].  Final contact due to program completion.

## 2019-01-27 ENCOUNTER — Ambulatory Visit: Payer: Medicaid Other

## 2019-02-01 ENCOUNTER — Ambulatory Visit: Payer: Medicaid Other

## 2019-02-03 ENCOUNTER — Ambulatory Visit: Payer: Medicaid Other

## 2019-02-08 ENCOUNTER — Ambulatory Visit: Payer: Medicaid Other

## 2019-02-10 ENCOUNTER — Ambulatory Visit: Payer: Medicaid Other

## 2019-02-15 ENCOUNTER — Ambulatory Visit: Payer: Medicaid Other

## 2019-02-17 ENCOUNTER — Ambulatory Visit: Payer: Medicaid Other

## 2019-02-22 ENCOUNTER — Ambulatory Visit: Payer: Medicaid Other

## 2019-02-24 ENCOUNTER — Ambulatory Visit: Payer: Medicaid Other

## 2019-03-01 ENCOUNTER — Ambulatory Visit: Payer: Medicaid Other

## 2019-03-03 ENCOUNTER — Ambulatory Visit: Payer: Medicaid Other

## 2019-04-08 ENCOUNTER — Other Ambulatory Visit: Payer: Self-pay

## 2019-04-08 ENCOUNTER — Encounter: Payer: Self-pay | Admitting: Internal Medicine

## 2019-04-08 ENCOUNTER — Inpatient Hospital Stay: Payer: Medicaid Other

## 2019-04-08 ENCOUNTER — Inpatient Hospital Stay: Payer: Medicaid Other | Attending: Internal Medicine | Admitting: Internal Medicine

## 2019-04-08 VITALS — BP 144/69 | HR 60 | Temp 96.7°F | Resp 22 | Ht 67.0 in | Wt 287.0 lb

## 2019-04-08 DIAGNOSIS — D696 Thrombocytopenia, unspecified: Secondary | ICD-10-CM

## 2019-04-08 DIAGNOSIS — G4730 Sleep apnea, unspecified: Secondary | ICD-10-CM | POA: Insufficient documentation

## 2019-04-08 DIAGNOSIS — Z87891 Personal history of nicotine dependence: Secondary | ICD-10-CM | POA: Diagnosis not present

## 2019-04-08 DIAGNOSIS — Z79899 Other long term (current) drug therapy: Secondary | ICD-10-CM | POA: Diagnosis not present

## 2019-04-08 DIAGNOSIS — F17210 Nicotine dependence, cigarettes, uncomplicated: Secondary | ICD-10-CM | POA: Insufficient documentation

## 2019-04-08 DIAGNOSIS — I252 Old myocardial infarction: Secondary | ICD-10-CM | POA: Insufficient documentation

## 2019-04-08 DIAGNOSIS — E119 Type 2 diabetes mellitus without complications: Secondary | ICD-10-CM | POA: Diagnosis not present

## 2019-04-08 DIAGNOSIS — Z7982 Long term (current) use of aspirin: Secondary | ICD-10-CM | POA: Diagnosis not present

## 2019-04-08 DIAGNOSIS — I10 Essential (primary) hypertension: Secondary | ICD-10-CM | POA: Diagnosis not present

## 2019-04-08 DIAGNOSIS — D72829 Elevated white blood cell count, unspecified: Secondary | ICD-10-CM | POA: Insufficient documentation

## 2019-04-08 DIAGNOSIS — Z7984 Long term (current) use of oral hypoglycemic drugs: Secondary | ICD-10-CM | POA: Insufficient documentation

## 2019-04-08 DIAGNOSIS — D72820 Lymphocytosis (symptomatic): Secondary | ICD-10-CM

## 2019-04-08 LAB — CBC WITH DIFFERENTIAL/PLATELET
Abs Immature Granulocytes: 0 10*3/uL (ref 0.00–0.07)
Basophils Absolute: 0 10*3/uL (ref 0.0–0.1)
Basophils Relative: 0 %
Eosinophils Absolute: 0.2 10*3/uL (ref 0.0–0.5)
Eosinophils Relative: 2 %
HCT: 43 % (ref 36.0–46.0)
Hemoglobin: 13.9 g/dL (ref 12.0–15.0)
Lymphocytes Relative: 52 %
Lymphs Abs: 5.3 10*3/uL — ABNORMAL HIGH (ref 0.7–4.0)
MCH: 28 pg (ref 26.0–34.0)
MCHC: 32.3 g/dL (ref 30.0–36.0)
MCV: 86.5 fL (ref 80.0–100.0)
Monocytes Absolute: 0.4 10*3/uL (ref 0.1–1.0)
Monocytes Relative: 4 %
Neutro Abs: 4.3 10*3/uL (ref 1.7–7.7)
Neutrophils Relative %: 42 %
Platelets: 114 10*3/uL — ABNORMAL LOW (ref 150–400)
RBC: 4.97 MIL/uL (ref 3.87–5.11)
RDW: 14.7 % (ref 11.5–15.5)
Smear Review: DECREASED
WBC: 10.2 10*3/uL (ref 4.0–10.5)
nRBC: 0 % (ref 0.0–0.2)

## 2019-04-08 LAB — TECHNOLOGIST SMEAR REVIEW

## 2019-04-08 LAB — VITAMIN B12: Vitamin B-12: 237 pg/mL (ref 180–914)

## 2019-04-08 LAB — LACTATE DEHYDROGENASE: LDH: 122 U/L (ref 98–192)

## 2019-04-08 LAB — APTT: aPTT: 29 seconds (ref 24–36)

## 2019-04-08 LAB — PROTIME-INR
INR: 1 (ref 0.8–1.2)
Prothrombin Time: 12.9 seconds (ref 11.4–15.2)

## 2019-04-08 LAB — FOLATE: Folate: 15.6 ng/mL (ref >5.9)

## 2019-04-08 NOTE — Progress Notes (Signed)
[facility] CONSULT NOTE  Patient Care Team: Toy CookeyHeadrick, Emily, FNP as PCP - General (Family Medicine)  CHIEF COMPLAINTS/PURPOSE OF CONSULTATION: Thrombocytopenia   HEMATOLOGY HISTORY  # THROMBOCYTOPENIA Austen.Abelson[platelets-109 ]; WBC-10.5 ; Hb-13; ALC- 5.6 [PCP- HepC/HIV-NEG]  # CAD on Asa/prasageul  HISTORY OF PRESENTING ILLNESS:  Karen Potts 58 y.o.  female pleasant patient was been referred to us for further evaluation of thrombocytopenia.  Patient has longstanding history of mild thrombocytopenia-however did not notice any easy bruising or bleeding.    Patient had an MI in 2019-patient was started on aspirin and Effient status post stenting.   Patient denies any weight loss but denies any nausea vomiting.  No gum bleeding or nosebleeds.  However patient has noticed increased bruising.  No new medications.  No history of alcohol hepatitis.    Review of Systems  Constitutional: Positive for malaise/fatigue. Negative for chills, diaphoresis, fever and weight loss.  HENT: Negative for nosebleeds and sore throat.   Eyes: Negative for double vision.  Respiratory: Positive for shortness of breath. Negative for cough, hemoptysis, sputum production and wheezing.   Cardiovascular: Negative for chest pain, palpitations, orthopnea and leg swelling.  Gastrointestinal: Negative for abdominal pain, blood in stool, constipation, diarrhea, heartburn, melena, nausea and vomiting.  Genitourinary: Negative for dysuria, frequency and urgency.  Musculoskeletal: Positive for back pain and joint pain.  Skin: Negative.  Negative for itching and rash.  Neurological: Negative for dizziness, tingling, focal weakness, weakness and headaches.  Endo/Heme/Allergies: Does not bruise/bleed easily.  Psychiatric/Behavioral: Negative for depression. The patient is not nervous/anxious and does not have insomnia.      MEDICAL HISTORY:  Past Medical History:  Diagnosis Date  . Diabetes mellitus  without complication (HCC)   . Hypertension   . Sleep apnea   . Vertigo     SURGICAL HISTORY: Past Surgical History:  Procedure Laterality Date  . ABDOMINAL HYSTERECTOMY    . BREAST BIOPSY Left 10/2017    APOCRINE TYPE CYST WITH FIBROSIS Of the Wall  . CORONARY STENT INTERVENTION N/A 02/24/2018   Procedure: CORONARY STENT INTERVENTION;  Surgeon: Marcina MillardParaschos, Alexander, MD;  Location: [facility] INVASIVE CV LAB;  Service: Cardiovascular;  Laterality: N/A;  . LEFT HEART CATH AND CORONARY ANGIOGRAPHY Right 02/24/2018   Procedure: LEFT HEART CATH AND CORONARY ANGIOGRAPHY;  Surgeon: Marcina MillardParaschos, Alexander, MD;  Location: [facility] INVASIVE CV LAB;  Service: Cardiovascular;  Laterality: Right;  . SHOULDER ARTHROSCOPY WITH OPEN ROTATOR CUFF REPAIR Left 08/14/2017   Procedure: SHOULDER ARTHROSCOPY WITH ROTATOR CUFF REPAIR, BICEPS TENOTOMY, SUBACROMIAL DECOMPRESSION;  Surgeon: Lyndle HerrlichBowers, James R, MD;  Location: [facility] ORS;  Service: Orthopedics;  Laterality: Left;  . UMBILICAL HERNIA REPAIR      SOCIAL HISTORY: Social History   Socioeconomic History  . Marital status: Married    Spouse name: Not on file  . Number of children: Not on file  . Years of education: Not on file  . Highest education level: Not on file  Occupational History  . Not on file  Social Needs  . Financial resource strain: Not on file  . Food insecurity    Worry: Not on file    Inability: Not on file  . Transportation needs    Medical: Not on file    Non-medical: Not on file  Tobacco Use  . Smoking status: Former Smoker    Types: Cigarettes    Quit date: 02/24/2018    Years since quitting: 1.1  . Smokeless tobacco: Never Used  . Tobacco comment: Psychiatric nurseJanifer  said she quit smoking the day of her heart attack.  Substance and Sexual Activity  . Alcohol use: No  . Drug use: No  . Sexual activity: Yes    Birth control/protection: Condom  Lifestyle  . Physical activity    Days per week: Not on file    Minutes per session: Not on file  .  Stress: Not on file  Relationships  . Social Musician on phone: Not on file    Gets together: Not on file    Attends religious service: Not on file    Active member of club or organization: Not on file    Attends meetings of clubs or organizations: Not on file    Relationship status: Not on file  . Intimate partner violence    Fear of current or ex partner: Not on file    Emotionally abused: Not on file    Physically abused: Not on file    Forced sexual activity: Not on file  Other Topics Concern  . Not on file  Social History Narrative   Not working/disability; smoking; no alcohol. In [place]/ lives with a friend.     FAMILY HISTORY: Family History  Problem Relation Age of Onset  . Breast cancer Neg Hx     ALLERGIES:  has No Known Allergies.  MEDICATIONS:  Current Outpatient Medications  Medication Sig Dispense Refill  . aspirin EC 81 MG tablet Take 81 mg by mouth daily.    Marland Kitchen atorvastatin (LIPITOR) 80 MG tablet Take 1 tablet (80 mg total) by mouth daily at 6 PM. 30 tablet 0  . diclofenac sodium (VOLTAREN) 1 % GEL Apply 2 g topically 4 (four) times daily as needed (pain in knee).    Marland Kitchen lisinopril-hydrochlorothiazide (PRINZIDE,ZESTORETIC) 10-12.5 MG tablet TAKE 1 TABLET BY MOUTH ONCE DAILY 90 tablet 1  . metFORMIN (GLUCOPHAGE) 500 MG tablet Take 500 mg by mouth daily with breakfast.     . prasugrel (EFFIENT) 10 MG TABS tablet Take by mouth.    . diazepam (VALIUM) 5 MG tablet Take 1 tablet (5 mg total) by mouth every 8 (eight) hours as needed (dizziness). (Patient not taking: Reported on 04/08/2019) 30 tablet 0  . loratadine (CLARITIN) 10 MG tablet Take by mouth.    . meclizine (ANTIVERT) 12.5 MG tablet Take 1 tablet (12.5 mg total) by mouth 3 (three) times daily as needed for dizziness or nausea. (Patient not taking: Reported on 07/05/2018) 30 tablet 1  . meclizine (ANTIVERT) 25 MG tablet Take 1 tablet (25 mg total) by mouth 3 (three) times daily as needed for  dizziness. (Patient not taking: Reported on 04/08/2019) 30 tablet 0  . nitroGLYCERIN (NITROSTAT) 0.4 MG SL tablet Place 1 tablet (0.4 mg total) under the tongue every 5 (five) minutes as needed for chest pain. (Patient not taking: Reported on 04/08/2019) 30 tablet 12   No current facility-administered medications for this visit.      Marland Kitchen  PHYSICAL EXAMINATION:   Vitals:   04/08/19 1126  BP: (!) 144/69  Pulse: 60  Resp: (!) 22  Temp: (!) 96.7 F (35.9 C)   Filed Weights   04/08/19 1126  Weight: 287 lb (130.2 kg)    Physical Exam  Constitutional: She is oriented to person, place, and time and well-developed, well-nourished, and in no distress.  HENT:  Head: Normocephalic and atraumatic.  Mouth/Throat: Oropharynx is clear and moist. No oropharyngeal exudate.  Eyes: Pupils are equal, round, and reactive to light.  Neck: Normal range of motion. Neck supple.  Cardiovascular: Normal rate and regular rhythm.  Pulmonary/Chest: No respiratory distress. She has no wheezes.  Abdominal: Soft. Bowel sounds are normal. She exhibits no distension and no mass. There is no abdominal tenderness. There is no rebound and no guarding.  Musculoskeletal: Normal range of motion.        General: No tenderness or edema.  Neurological: She is alert and oriented to person, place, and time.  Skin: Skin is warm.  Psychiatric: Affect normal.     LABORATORY DATA:  I have reviewed the data as listed Lab Results  Component Value Date   WBC 10.2 04/08/2019   HGB 13.9 04/08/2019   HCT 43.0 04/08/2019   MCV 86.5 04/08/2019   PLT 114 (L) 04/08/2019   Recent Labs    04/10/18 0911 09/11/18 0509  NA 140 137  K 3.5 3.6  CL 105 103  CO2 25 23  GLUCOSE 125* 165*  BUN 14 21*  CREATININE 0.70 0.76  CALCIUM 9.0 9.2  GFRNONAA >60 >60  GFRAA >60 >60  PROT 7.6 7.9  ALBUMIN 3.9 4.0  AST 41 48*  ALT 32 35  ALKPHOS 47 53  BILITOT 0.8 0.6     No results found.  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  Suspect more benign causes unrelated to intolerance.  Check an ultrasound abdomen for hepatosplenomegaly.  #Mild leukocytosis-chronic-question of CLL versus other chronic benign causes.  # Easy bruising -unlikely secondary to measure platelets; likely from aspirin /Effient.  As per cardiology  # knee surgery [s/p cortisone shot; Dr.Bowers]; patient should be okay from hematology standpoint to proceed with surgery as on the platelets of 100,000.  # CAD/MI- [Aug 2019]- asprin + Effient- [Dr.Parschoes/].  Stable.  #Smoker : discussed with the patient regarding the ill effects of smoking- including but not limited to cardiac lung and vascular diseases and malignancies.  Will discuss lung cancer screening program.   Thank you Ms.[place] FNP for allowing me to participate in the care of your pleasant patient. Please do not hesitate to contact me with questions or concerns in the interim.  # DISPOSITION: # labs today # Abdominal US in 1 week # Follow up in 2 weeks- MD; no labs- Dr.B  All questions were answered. The patient knows to call the clinic with any problems, questions or concerns.  Thank you Dr. for allowing me to participate in the care of your pleasant patient. Please do not hesitate to contact me with questions or concerns in the interim.   Cammie Sickle, MD 04/08/2019 6:07 PM  # 45 minutes face-to-face with the patient discussing the above plan of care; more than 50% of time spent on counseling and coordination.

## 2019-04-08 NOTE — Assessment & Plan Note (Addendum)
#  Chronic thrombocytopenia-mild at 100,000 unclear etiology ITP versus other causes.  Suspect more benign causes unrelated to intolerance.  Check an ultrasound abdomen for hepatosplenomegaly.  #Mild leukocytosis-chronic-question of CLL versus other chronic benign causes.  # Easy bruising -unlikely secondary to measure platelets; likely from aspirin /Effient.  As per cardiology  # knee surgery [s/p cortisone shot; Dr.Bowers]; patient should be okay from hematology standpoint to proceed with surgery as on the platelets of 100,000.  # CAD/MI- [Aug 2019]- asprin + Effient- [Dr.Parschoes/].  Stable.  #Smoker : discussed with the patient regarding the ill effects of smoking- including but not limited to cardiac lung and vascular diseases and malignancies.  Will discuss lung cancer screening program.   Thank you Ms.[place] FNP for allowing me to participate in the care of your pleasant patient. Please do not hesitate to contact me with questions or concerns in the interim.  # DISPOSITION: # labs today # Abdominal US in 1 week # Follow up in 2 weeks- MD; no labs- Dr.B

## 2019-04-13 LAB — COMP PANEL: LEUKEMIA/LYMPHOMA

## 2019-04-15 ENCOUNTER — Ambulatory Visit
Admission: RE | Admit: 2019-04-15 | Discharge: 2019-04-15 | Disposition: A | Discharge status: Home / Self Care | Payer: Medicaid Other | Source: Ambulatory Visit | Attending: Internal Medicine | Admitting: Internal Medicine

## 2019-04-15 ENCOUNTER — Other Ambulatory Visit: Payer: Self-pay

## 2019-04-15 ENCOUNTER — Telehealth: Payer: Self-pay | Admitting: Internal Medicine

## 2019-04-15 DIAGNOSIS — D696 Thrombocytopenia, unspecified: Secondary | ICD-10-CM | POA: Diagnosis not present

## 2019-04-15 DIAGNOSIS — D72820 Lymphocytosis (symptomatic): Secondary | ICD-10-CM | POA: Insufficient documentation

## 2019-04-15 NOTE — Telephone Encounter (Signed)
Jenny-I believe you see the patient next week  I reviewed the patient's labs/ultrasound-likely reactive mild leukocytosis/lymphocytosis; no significant concerns for an acute process.  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  Continue monitor for now.  I would just recommend follow-up CBC in 4 months/MD.

## 2019-04-21 ENCOUNTER — Other Ambulatory Visit: Payer: Self-pay

## 2019-04-21 ENCOUNTER — Encounter: Payer: Self-pay | Admitting: Oncology

## 2019-04-22 ENCOUNTER — Other Ambulatory Visit: Payer: Self-pay

## 2019-04-22 ENCOUNTER — Inpatient Hospital Stay (HOSPITAL_BASED_OUTPATIENT_CLINIC_OR_DEPARTMENT_OTHER): Payer: Medicaid Other | Admitting: Oncology

## 2019-04-22 VITALS — BP 125/53 | HR 57 | Temp 97.2°F | Resp 20

## 2019-04-22 DIAGNOSIS — Z720 Tobacco use: Secondary | ICD-10-CM | POA: Diagnosis not present

## 2019-04-22 DIAGNOSIS — D696 Thrombocytopenia, unspecified: Secondary | ICD-10-CM | POA: Diagnosis not present

## 2019-04-22 NOTE — Assessment & Plan Note (Addendum)
#  Chronic thrombocytopenia-mild at 100,000 unclear etiology ITP versus other causes.  Suspect more benign causes unrelated to intolerance. Ultrasound negative for hepatosplenomegaly.  Fatty liver.There is a cyst within the spleen measuring 1.9 x 1.4 x 1.8 cm.  #Mild leukocytosis-chronic-question of CLL versus other chronic benign causes.  # Easy bruising -unlikely secondary to measure platelets; likely from aspirin /Effient.   # knee surgery [s/p cortisone shot; Dr.Bowers]; patient should be okay from hematology standpoint to proceed with surgery as on the platelets of 100,000.  # CAD/MI- [Aug 2019]- asprin + Effient- [Dr.Parschoes/].  Stable.  #Smoker : discussed with the patient regarding the ill effects of smoking- including but not limited to cardiac lung and vascular diseases and malignancies.  Will discuss lung cancer screening program.   # DISPOSITION: RTC in 2 weeks for labs and MD assessment. Referral to lung screening program.

## 2019-04-22 NOTE — Progress Notes (Signed)
[facility] CONSULT NOTE  Patient Care Team: Toy Cookey, FNP as PCP - General (Family Medicine)  CHIEF COMPLAINTS/PURPOSE OF CONSULTATION: Thrombocytopenia   HEMATOLOGY HISTORY  # THROMBOCYTOPENIA Austen.Abelson ]; WBC-10.5 ; Hb-13; ALC- 5.6 [PCP- HepC/HIV-NEG]  # CAD on Asa/prasageul  HISTORY OF PRESENTING ILLNESS:  Karen Potts 58 y.o.  female pleasant patient was been referred to [place] for further evaluation of thrombocytopenia.  Patient has longstanding history of mild thrombocytopenia-however did not notice any easy bruising or bleeding.    Patient had an MI in 2019-patient was started on aspirin and Effient status post stenting.   Today, she denies any weight loss, nausea or vomiting.  She denies any bleeding.  Has noticed some bruising.  Denies any injury to those areas.  Denies any new medications.  She admits to emotional strain over the past 6 to 8 months.  It began with her heart attack followed by her husband passing away in May 2020.  She recently lost her home and has been living in a hotel.  She is very tearful.  States she was just approved for disability.     Review of Systems  Constitutional: Positive for malaise/fatigue.  Musculoskeletal: Positive for joint pain (Right knee).  Psychiatric/Behavioral: Positive for depression. The patient is nervous/anxious and has insomnia.      MEDICAL HISTORY:  Past Medical History:  Diagnosis Date  . Diabetes mellitus without complication (HCC)   . Hypertension   . Sleep apnea   . Vertigo     SURGICAL HISTORY: Past Surgical History:  Procedure Laterality Date  . ABDOMINAL HYSTERECTOMY    . BREAST BIOPSY Left 10/2017    APOCRINE TYPE CYST WITH FIBROSIS Of the Wall  . CORONARY STENT INTERVENTION N/A 02/24/2018   Procedure: CORONARY STENT INTERVENTION;  Surgeon: Marcina Millard, MD;  Location: [facility] INVASIVE CV LAB;  Service: Cardiovascular;  Laterality: N/A;  . LEFT HEART CATH AND CORONARY  ANGIOGRAPHY Right 02/24/2018   Procedure: LEFT HEART CATH AND CORONARY ANGIOGRAPHY;  Surgeon: Marcina Millard, MD;  Location: [facility] INVASIVE CV LAB;  Service: Cardiovascular;  Laterality: Right;  . SHOULDER ARTHROSCOPY WITH OPEN ROTATOR CUFF REPAIR Left 08/14/2017   Procedure: SHOULDER ARTHROSCOPY WITH ROTATOR CUFF REPAIR, BICEPS TENOTOMY, SUBACROMIAL DECOMPRESSION;  Surgeon: Lyndle Herrlich, MD;  Location: [facility] ORS;  Service: Orthopedics;  Laterality: Left;  . UMBILICAL HERNIA REPAIR      SOCIAL HISTORY: Social History   Socioeconomic History  . Marital status: Married    Spouse name: Not on file  . Number of children: Not on file  . Years of education: Not on file  . Highest education level: Not on file  Occupational History  . Not on file  Social Needs  . Financial resource strain: Not on file  . Food insecurity    Worry: Not on file    Inability: Not on file  . Transportation needs    Medical: Not on file    Non-medical: Not on file  Tobacco Use  . Smoking status: Former Smoker    Types: Cigarettes    Quit date: 02/24/2018    Years since quitting: 1.1  . Smokeless tobacco: Never Used  . Tobacco comment: Adeliz said she quit smoking the day of her heart attack.  Substance and Sexual Activity  . Alcohol use: No  . Drug use: No  . Sexual activity: Yes    Birth control/protection: Condom  Lifestyle  . Physical activity    Days per week: Not on  file    Minutes per session: Not on file  . Stress: Not on file  Relationships  . Social Herbalist on phone: Not on file    Gets together: Not on file    Attends religious service: Not on file    Active member of club or organization: Not on file    Attends meetings of clubs or organizations: Not on file    Relationship status: Not on file  . Intimate partner violence    Fear of current or ex partner: Not on file    Emotionally abused: Not on file    Physically abused: Not on file    Forced sexual activity:  Not on file  Other Topics Concern  . Not on file  Social History Narrative   Not working/disability; smoking; no alcohol. In [place]/ lives with a friend.     FAMILY HISTORY: Family History  Problem Relation Age of Onset  . Breast cancer Neg Hx     ALLERGIES:  has No Known Allergies.  MEDICATIONS:  Current Outpatient Medications  Medication Sig Dispense Refill  . aspirin EC 81 MG tablet Take 81 mg by mouth daily.    Marland Kitchen atorvastatin (LIPITOR) 80 MG tablet Take 1 tablet (80 mg total) by mouth daily at 6 PM. 30 tablet 0  . diazepam (VALIUM) 5 MG tablet Take 1 tablet (5 mg total) by mouth every 8 (eight) hours as needed (dizziness). 30 tablet 0  . diclofenac sodium (VOLTAREN) 1 % GEL Apply 2 g topically 4 (four) times daily as needed (pain in knee).    Marland Kitchen lisinopril-hydrochlorothiazide (PRINZIDE,ZESTORETIC) 10-12.5 MG tablet TAKE 1 TABLET BY MOUTH ONCE DAILY 90 tablet 1  . loratadine (CLARITIN) 10 MG tablet Take by mouth.    . meclizine (ANTIVERT) 12.5 MG tablet Take 1 tablet (12.5 mg total) by mouth 3 (three) times daily as needed for dizziness or nausea. 30 tablet 1  . meclizine (ANTIVERT) 25 MG tablet Take 1 tablet (25 mg total) by mouth 3 (three) times daily as needed for dizziness. 30 tablet 0  . metFORMIN (GLUCOPHAGE) 500 MG tablet Take 500 mg by mouth daily with breakfast.     . nitroGLYCERIN (NITROSTAT) 0.4 MG SL tablet Place 1 tablet (0.4 mg total) under the tongue every 5 (five) minutes as needed for chest pain. 30 tablet 12  . prasugrel (EFFIENT) 10 MG TABS tablet Take by mouth.     No current facility-administered medications for this visit.      Marland Kitchen  PHYSICAL EXAMINATION:   Vitals:   04/22/19 1048 04/22/19 1050  BP:  (!) 125/53  Pulse:  (!) 57  Resp: 18 20  Temp: (!) 97.2 F (36.2 C)    Filed Weights    Physical Exam  Constitutional: She is oriented to person, place, and time and well-developed, well-nourished, and in no distress. Vital signs are normal.   obese  HENT:  Head: Normocephalic and atraumatic.  Eyes: Pupils are equal, round, and reactive to light.  Neck: Normal range of motion.  Cardiovascular: Normal rate, regular rhythm and normal heart sounds.  No murmur heard. Pulmonary/Chest: Effort normal and breath sounds normal. She has no wheezes.  Abdominal: Soft. Normal appearance and bowel sounds are normal. She exhibits no distension. There is no abdominal tenderness.  Musculoskeletal: Normal range of motion.        General: No edema.     Comments: Uses a cane  Neurological: She is alert and oriented to person,  place, and time. Gait normal.  Skin: Skin is warm and dry. No rash noted.  Psychiatric: Mood, memory, affect and judgment normal.     LABORATORY DATA:  I have reviewed the data as listed Lab Results  Component Value Date   WBC 10.2 04/08/2019   HGB 13.9 04/08/2019   HCT 43.0 04/08/2019   MCV 86.5 04/08/2019   PLT 114 (L) 04/08/2019   Recent Labs    09/11/18 0509  NA 137  K 3.6  CL 103  CO2 23  GLUCOSE 165*  BUN 21*  CREATININE 0.76  CALCIUM 9.2  GFRNONAA >60  GFRAA >60  PROT 7.9  ALBUMIN 4.0  AST 48*  ALT 35  ALKPHOS 53  BILITOT 0.6     [place] Abdomen Complete  Result Date: 04/15/2019 CLINICAL DATA:  Thrombocytopenia EXAM: ABDOMEN ULTRASOUND COMPLETE COMPARISON:  None. FINDINGS: Gallbladder: No gallstones or wall thickening visualized. There is no pericholecystic fluid. No sonographic Murphy sign noted by sonographer. Common bile duct: Diameter: 3 mm. No intrahepatic, common hepatic, or common bile duct dilatation. Liver: No focal lesion identified. Liver echogenicity is increased diffusely period. Portal vein is patent on color Doppler imaging with normal direction of blood flow towards the liver. IVC: No abnormality visualized in regions which can be visualized. Most of the infrahepatic inferior vena cava is obscured by gas. Pancreas: Pancreas essentially completely obscured by gas. Spleen: Size and  appearance within normal limits. Spleen measures just over 6 cm in length. There is a cyst within the spleen measuring 1.9 x 1.4 x 1.8 cm. Right Kidney: Length: 11.0 cm. Echogenicity within normal limits. No mass or hydronephrosis visualized. Left Kidney: Length: 11.5 cm. Echogenicity within normal limits. No mass or hydronephrosis visualized. Abdominal aorta: No aneurysm visualized. Other findings: No demonstrable ascites. IMPRESSION: 1. Diffuse increase in liver echogenicity, a finding indicative of hepatic steatosis. While no focal liver lesions are evident on this study, it must be cautioned that the sensitivity of ultrasound for detection of focal liver lesions is diminished significantly in this circumstance. 2. Spleen normal in size and contour. There is a small cyst within the spleen. 3. Pancreas obscured by gas. Much of the inferior vena cava obscured by gas. 4.  Study otherwise unremarkable. Electronically Signed   By: Bretta BangWilliam  Woodruff III M.D.   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  Suspect more benign causes unrelated to intolerance. Ultrasound negative for hepatosplenomegaly.  Fatty liver.There is a cyst within the spleen measuring 1.9 x 1.4 x 1.8 cm.  #Mild leukocytosis-chronic-question of CLL versus other chronic benign causes.  # Easy bruising -unlikely secondary to measure platelets; likely from aspirin /Effient.   # knee surgery [s/p cortisone shot; Dr.Bowers]; patient should be okay from hematology standpoint to proceed with surgery as on the platelets of 100,000.  # CAD/MI- [Aug 2019]- asprin + Effient- [Dr.Parschoes/].  Stable.  #Smoker : discussed with the patient regarding the ill effects of smoking- including but not limited to cardiac lung and vascular diseases and malignancies.  Will discuss lung cancer screening program.   # DISPOSITION: RTC in 2 weeks for  labs and MD assessment. Referral to lung screening program.   All questions were answered. The patient knows to call the clinic with any problems, questions or concerns.  Thank you Dr. for allowing me to participate in the care of your pleasant patient. Please do not hesitate to contact  me with questions or concerns in the interim.   Mauro Kaufmann, NP 04/22/2019 12:15 PM   CC: Dr. Donneta Romberg

## 2019-04-29 ENCOUNTER — Telehealth: Payer: Self-pay | Admitting: *Deleted

## 2019-04-29 NOTE — Telephone Encounter (Signed)
Received referral for lung cancer screening. Contacted patient and discussed eligibility including smoking history. Patient verbalized history of < 1/2 pack per day for 17 years. Patient is made aware of 30 pack year eligibility and verbalizes understanding.

## 2019-05-04 ENCOUNTER — Ambulatory Visit: Payer: Medicaid Other | Attending: Family Medicine

## 2019-05-04 ENCOUNTER — Other Ambulatory Visit: Payer: Self-pay

## 2019-05-04 DIAGNOSIS — M25551 Pain in right hip: Secondary | ICD-10-CM | POA: Diagnosis present

## 2019-05-04 DIAGNOSIS — M1711 Unilateral primary osteoarthritis, right knee: Secondary | ICD-10-CM | POA: Diagnosis present

## 2019-05-04 DIAGNOSIS — R2689 Other abnormalities of gait and mobility: Secondary | ICD-10-CM | POA: Diagnosis present

## 2019-05-04 NOTE — Therapy (Signed)
[place] [facility] [address] [place], [place], [address] Phone: [phone]   Fax:  [phone]  Physical Therapy Evaluation  Patient Details  Name: Karen Potts MRN: [id number] Date of Birth: [birth year] Referring Provider (PT): Toy CookeyHeadrick, Emily MD   Encounter Date: 05/04/2019  PT End of Session - 05/04/19 1614    Visit Number  1    Number of Visits  17    Date for PT Re-Evaluation  06/29/19    Authorization Type  1/10    PT Start Time  1615    PT Stop Time  1715    PT Time Calculation (min)  60 min    Activity Tolerance  Patient tolerated treatment well;Patient limited by pain    Behavior During Therapy  [facility] for tasks assessed/performed       Past Medical History:  Diagnosis Date  . Diabetes mellitus without complication (HCC)   . Hypertension   . Sleep apnea   . Vertigo     Past Surgical History:  Procedure Laterality Date  . ABDOMINAL HYSTERECTOMY    . BREAST BIOPSY Left 10/2017    APOCRINE TYPE CYST WITH FIBROSIS Of the Wall  . CORONARY STENT INTERVENTION N/A 02/24/2018   Procedure: CORONARY STENT INTERVENTION;  Surgeon: Marcina MillardParaschos, Alexander, MD;  Location: [facility] INVASIVE CV LAB;  Service: Cardiovascular;  Laterality: N/A;  . LEFT HEART CATH AND CORONARY ANGIOGRAPHY Right 02/24/2018   Procedure: LEFT HEART CATH AND CORONARY ANGIOGRAPHY;  Surgeon: Marcina MillardParaschos, Alexander, MD;  Location: [facility] INVASIVE CV LAB;  Service: Cardiovascular;  Laterality: Right;  . SHOULDER ARTHROSCOPY WITH OPEN ROTATOR CUFF REPAIR Left 08/14/2017   Procedure: SHOULDER ARTHROSCOPY WITH ROTATOR CUFF REPAIR, BICEPS TENOTOMY, SUBACROMIAL DECOMPRESSION;  Surgeon: Lyndle HerrlichBowers, James R, MD;  Location: [facility] ORS;  Service: Orthopedics;  Laterality: Left;  . UMBILICAL HERNIA REPAIR      There were no vitals filed for this visit.   Fall History: 1 x per previous 6 months 3 stairs to enter home R sided rail to enter  PAIN Location: R  knee  5/10 current 10/10 worst 0/10 best  Irritability: moderate Nature: MSK Stage: chronic Stability: ISQ  Agg: walking, standing Ease: medication, rest 24-hour behavior: worse in morning with increased stiffness, improved with activity  Note peripatellar edema R   POSTURE Standing: mild ATP, increased WB over LLE  GAIT / MOBILITY / TRANSFERS Reduced stance time R, reduced stride length R, Trendelenberg R with QL compensation  Sit tol: unlimited Stand tol: 5 minutes Walk tol: 10 minutes  Squat: cannot perform Stairs: reciprocal pattern ascending; descends laterally or step-to leading with R   PROM / AROM / MMT   HIP ROM  MMT    R L R L  Flex WNL WNL 3+! 4  Ext. WNL WNL 3+! 4+  ABD   2+! 4-  ADD   4 4  ER WNL WNL    IR 14! 38!    ! = painful   KNEE ROM  MMT    R L R L  Flex 110 115 3+! 5  Ext 0 0 4-! 4  ! = pain   ANKLE ROM  MMT    R L R L  DF WNL WNL 5 5  PF WNL WNL 4! 5  ! = pain  TISSUE LENGTH L HS length 40 deg SLR R cannot be assessed due to pain   PALPATION/PAM Patellar mobility decreased superiorly, WNL otherwise Femorotibial joint mildly hypomobile  Note increased tissue tension in R quads, HS relative to L   SPECIAL TESTS + patellar grind R + McMurray's R with tibial IR/ER + CPR for hip OA R: pain with flexion, pain with extension, limited IR, squatting agg + SLR R  OUTCOMES LEFS 25/80  0.69 m/sec (limited community ambulator, increased fall risk)     TREATMENT  GT  Sequencing for use of SPC. Adjusted SPC height.  TE Heel slides x 10 R SAQ x 10 R     PT Education - 05/04/19 1614    Education Details  Diagnosis/prognosis/POC; HEP to include heel slides and SAQ    Person(s) Educated  Patient    Methods  Explanation;Demonstration;Verbal cues;Tactile cues    Comprehension  Verbalized understanding;Returned demonstration;Verbal cues required;Tactile cues required       PT Short Term Goals - 05/04/19 1950       PT SHORT TERM GOAL #1   Title  Patient will be independent with HEP as adjunct to clinical therapy and to reduce number of visits.    Baseline  HEP given    Time  2    Period  Weeks    Status  New    Target Date  05/18/19      PT SHORT TERM GOAL #2   Title  Patient will demonstrate ability to perform correct gait pattern with SPC 100% of the time for improved energy conservation and functional capacity with ambulation and ADLs.    Baseline  Correct gait pattern 50% of the time    Time  2    Period  Weeks    Status  New    Target Date  05/18/19        PT Long Term Goals - 05/04/19 1951      PT LONG TERM GOAL #1   Title  Patient will improve speed to 0.8 m/sec to achieve community ambulator status and reduce fall risks.    Baseline  0.69 m/sec with SPC    Time  8    Period  Weeks    Status  New    Target Date  06/29/19      PT LONG TERM GOAL #2   Title  Patient will improve LEFS score by 9 points to achieve MCID for reduced disability and improved function.    Baseline  LEFS 25    Time  8    Period  Weeks    Status  New    Target Date  06/29/19      PT LONG TERM GOAL #3   Title  Patient will reduce worst pain to 6/10 to demonstrate reduced disability with ADLs and improved functional capacity.    Baseline  10/10 worst pain    Time  8    Period  Weeks    Status  New    Target Date  06/29/19      PT LONG TERM GOAL #4   Title  Patient will report walking tolerance of 45 minutes for for return to cardiac walking program.    Baseline  walking tolerance 20 min    Time  8    Period  Weeks    Status  New    Target Date  06/29/19             Plan - 05/04/19 1923    Clinical Impression Statement  Patient is a 58 yo female with increased body habitus presenting with referral for knee OA. Examination reveals crepitus  and bony overgrowth in knees R>L with marked weakness in RLE especially in hip. Positive McMurray's test for medial and lateral menisci R but  other connective tissue structures intact. ROM good (ext 0, flex 110). Note that R hip meets clinical prediction rule for hip OA including pain with flexion and extension, limited IR, squatting agg. Trendelenberg R. LEFS: 25. Clinical Impression: presentation consistent with knee OA, moderate severity, with R hip and lumbar involvement. Patient will benefit from physical therapy to improve exercise tolerance and mobility with ADLs.    Personal Factors and Comorbidities  Age;Comorbidity 3+;Fitness    Comorbidities  CAD, vertigo, HTN, L shoulder pain/weakness    Examination-Activity Limitations  Bathing;Locomotion Level;Bend;Squat;Stairs;Stand;Dressing    Examination-Participation Restrictions  Cleaning;Community Activity;Other   Exercise   Stability/Clinical Decision Making  Evolving/Moderate complexity    Clinical Decision Making  Moderate    Rehab Potential  Good    PT Frequency  2x / week    PT Duration  8 weeks    PT Treatment/Interventions  ADLs/Self Care Home Management;Aquatic Therapy;Cryotherapy;Electrical Stimulation;Moist Heat;DME Instruction;Gait training;Stair training;Functional mobility training;Therapeutic activities;Therapeutic exercise;Balance training;Neuromuscular re-education;Patient/family education;Manual techniques;Passive range of motion;Dry needling;Energy conservation;Taping;Joint Manipulations    PT Next Visit Plan  Progress gait training and AROM therex    PT Home Exercise Plan  heel slides, SAQ    Consulted and Agree with Plan of Care  Patient       Patient will benefit from skilled therapeutic intervention in order to improve the following deficits and impairments:  Abnormal gait, Decreased coordination, Decreased range of motion, Difficulty walking, Decreased endurance, Obesity, Decreased activity tolerance, Pain, Decreased balance, Hypomobility, Improper body mechanics, Impaired flexibility, Decreased mobility, Decreased strength, Increased edema, Postural  dysfunction  Visit Diagnosis: Primary osteoarthritis of right knee  Other abnormalities of gait and mobility  Pain in right hip     Problem List Patient Active Problem List   Diagnosis Date Noted  . Thrombocytopenia ([place]) 04/08/2019  . HTN (hypertension) 07/05/2018  . Tobacco use 07/05/2018  . S/P drug eluting coronary stent placement 03/15/2018  . Unstable angina ([place]) 02/24/2018  . NSTEMI (non-ST elevated myocardial infarction) ([place]) 02/24/2018  . Full thickness rotator cuff tear 07/31/2017    Virgia Land, SPT 05/05/2019, 9:14 AM  [place] [facility] [address] [place], [address] Phone: [phone]   Fax:  [phone]  Name: Karen Potts MRN: [id number] Date of Birth: [birth year]

## 2019-05-05 NOTE — Addendum Note (Signed)
Addended by: Blain Pais on: 05/05/2019 09:16 AM   Modules accepted: Orders

## 2019-05-06 ENCOUNTER — Other Ambulatory Visit: Payer: Medicaid Other

## 2019-05-06 ENCOUNTER — Ambulatory Visit: Payer: Medicaid Other | Admitting: Internal Medicine

## 2019-05-11 ENCOUNTER — Ambulatory Visit: Payer: Medicaid Other

## 2019-05-11 ENCOUNTER — Other Ambulatory Visit: Payer: Self-pay

## 2019-05-11 DIAGNOSIS — R2689 Other abnormalities of gait and mobility: Secondary | ICD-10-CM

## 2019-05-11 DIAGNOSIS — M1711 Unilateral primary osteoarthritis, right knee: Secondary | ICD-10-CM | POA: Diagnosis not present

## 2019-05-11 DIAGNOSIS — M25551 Pain in right hip: Secondary | ICD-10-CM

## 2019-05-11 NOTE — Therapy (Signed)
[place] [facility] [address] [place], [address] Phone: [phone]   Fax:  [phone]  Physical Therapy Treatment  Patient Details  Name: Karen Potts MRN: [id number] Date of Birth: 05/29/1961 Referring Provider (PT): Gennette Pac MD   Encounter Date: 05/11/2019  PT End of Session - 05/11/19 5400    Visit Number  2    Number of Visits  17    Date for PT Re-Evaluation  06/29/19    Authorization Type  2/10    PT Start Time  1436    PT Stop Time  1518    PT Time Calculation (min)  42 min    Activity Tolerance  Patient tolerated treatment well;Patient limited by pain    Behavior During Therapy  [facility] for tasks assessed/performed       Past Medical History:  Diagnosis Date  . Diabetes mellitus without complication ([place])   . Hypertension   . Sleep apnea   . Vertigo     Past Surgical History:  Procedure Laterality Date  . ABDOMINAL HYSTERECTOMY    . BREAST BIOPSY Left 10/2017    APOCRINE TYPE CYST WITH FIBROSIS Of the Wall  . CORONARY STENT INTERVENTION N/A 02/24/2018   Procedure: CORONARY STENT INTERVENTION;  Surgeon: Isaias Cowman, MD;  Location: [place] CV [facility];  Service: Cardiovascular;  Laterality: N/A;  . LEFT HEART CATH AND CORONARY ANGIOGRAPHY Right 02/24/2018   Procedure: LEFT HEART CATH AND CORONARY ANGIOGRAPHY;  Surgeon: Isaias Cowman, MD;  Location: [place] [facility];  Service: Cardiovascular;  Laterality: Right;  . SHOULDER ARTHROSCOPY WITH OPEN ROTATOR CUFF REPAIR Left 08/14/2017   Procedure: SHOULDER ARTHROSCOPY WITH ROTATOR CUFF REPAIR, BICEPS TENOTOMY, SUBACROMIAL DECOMPRESSION;  Surgeon: Lovell Sheehan, MD;  Location: [facility] ORS;  Service: Orthopedics;  Laterality: Left;  . UMBILICAL HERNIA REPAIR      There were no vitals filed for this visit.  Subjective Assessment - 05/11/19 1436    Subjective  Patient reports getting cortisone shot yesterday with good  results. Reports no pain today.    Patient is accompained by:  Family member   Adult daughter   Pertinent History  Onset [name] 2020 during treadmill training as part of cardiac rehabilitation. Cortisone shots provide relief for "a couple months"; last shot in June, relief until September. Attempting to increase exercise for weight loss and cardiac health but currently able to walk about "one block" before requiring rest due to knee pain. Recently purchased [facility] for assistance with ambulation. Pain 5/10, 10/10 worst, 0/10 best, with pain in R hip and LBP with increased activity. Agg: walking, standing. Ease: Voltaren, Aspirin, rest. Does not bother her in non-WB. Comorbidities: CAD, HTN, smoker, occasional vertigo "comes and goes every few months", DM2; L shoulder rotator cuff repair with fair recovery.    Limitations  Walking;Standing    How long can you sit comfortably?  unlimited    How long can you stand comfortably?  5 min    How long can you walk comfortably?  20 min    Diagnostic tests  x-ray (per pt report; the cartilage was "gone" on B knees)    Patient Stated Goals  walk with less pain to lose weight    Currently in Pain?  No/denies    Pain Onset  More than a month ago         TREATMENT  TE Ball rolls for AROM knee flexion/extension x 30 Bridges x 10 Instruction in use  of heat and tennis ball massage for recovery  GT Ambulation 10 x 50 ft trials with instruction for sequencing with SPC, handedness with SPC, larger step through for improved efficiency Stairs 6 x 4 steps 8" riser with instruction in step-to gait ascending with good/descending with bad. Patient demonstrates ability to perform step-over-step but with increased pain descending on RLE and requiring UE assist for balance.  Following bridging exercise patient c/o dizziness. Patient was returned to supine.  BP 146/59  After several minutes recovery patient able to rise without incident.  Response consistent with  orthostasis but exertional hypotension cannot be r/o. Pt reports history of dizziness/vertigo with turning to R from supine for which she saw another MD. Reported symptoms seem consistent with BPPV but additional examination is needed. Plan to request vestibular examination for patient safety.     PT Education - 05/11/19 1437    Education Details  form/technique with exercise    Person(s) Educated  Patient    Methods  Explanation;Demonstration;Tactile cues;Verbal cues    Comprehension  Verbalized understanding;Returned demonstration;Verbal cues required;Tactile cues required       PT Short Term Goals - 05/04/19 1950      PT SHORT TERM GOAL #1   Title  Patient will be independent with HEP as adjunct to clinical therapy and to reduce number of visits.    Baseline  HEP given    Time  2    Period  Weeks    Status  New    Target Date  05/18/19      PT SHORT TERM GOAL #2   Title  Patient will demonstrate ability to perform correct gait pattern with SPC 100% of the time for improved energy conservation and functional capacity with ambulation and ADLs.    Baseline  Correct gait pattern 50% of the time    Time  2    Period  Weeks    Status  New    Target Date  05/18/19        PT Long Term Goals - 05/04/19 1951      PT LONG TERM GOAL #1   Title  Patient will improve 10MW speed to 0.8 m/sec to achieve community ambulator status and reduce fall risks.    Baseline  0.69 m/sec with SPC    Time  8    Period  Weeks    Status  New    Target Date  06/29/19      PT LONG TERM GOAL #2   Title  Patient will improve LEFS score by 9 points to achieve MCID for reduced disability and improved function.    Baseline  LEFS 25    Time  8    Period  Weeks    Status  New    Target Date  06/29/19      PT LONG TERM GOAL #3   Title  Patient will reduce worst pain to 6/10 to demonstrate reduced disability with ADLs and improved functional capacity.    Baseline  10/10 worst pain    Time  8     Period  Weeks    Status  New    Target Date  06/29/19      PT LONG TERM GOAL #4   Title  Patient will report walking tolerance of 45 minutes for for return to cardiac walking program.    Baseline  walking tolerance 20 min    Time  8    Period  Weeks    Status  New    Target Date  06/29/19            Plan - 05/11/19 1528    Clinical Impression Statement  Patient demonstrates dramatically reduced pain and apprehension following injection. Demonstrates good retention with gait training and awareness of compensatory patterns including Trendelenberg and LLE preference. Session concerning for orthostatic presentation following bridging exercise; plan for full vestibular evaluation from specialist PT. Patient will benefit from physical therapy to improve exercise tolerance and mobility with ADLs.    Personal Factors and Comorbidities  Age;Comorbidity 3+;Fitness    Comorbidities  CAD, vertigo, HTN, L shoulder pain/weakness    Examination-Activity Limitations  Bathing;Locomotion Level;Bend;Squat;Stairs;Stand;Dressing    Examination-Participation Restrictions  Cleaning;Community Activity;Other   Exercise   Stability/Clinical Decision Making  Evolving/Moderate complexity    Rehab Potential  Good    PT Frequency  2x / week    PT Duration  8 weeks    PT Treatment/Interventions  ADLs/Self Care Home Management;Aquatic Therapy;Cryotherapy;Electrical Stimulation;Moist Heat;DME Instruction;Gait training;Stair training;Functional mobility training;Therapeutic activities;Therapeutic exercise;Balance training;Neuromuscular re-education;Patient/family education;Manual techniques;Passive range of motion;Dry needling;Energy conservation;Taping;Joint Manipulations    PT Next Visit Plan  Progress gait training and AROM therex    PT Home Exercise Plan  heel slides, SAQ    Consulted and Agree with Plan of Care  Patient       Patient will benefit from skilled therapeutic intervention in order to improve the  following deficits and impairments:  Abnormal gait, Decreased coordination, Decreased range of motion, Difficulty walking, Decreased endurance, Obesity, Decreased activity tolerance, Pain, Decreased balance, Hypomobility, Improper body mechanics, Impaired flexibility, Decreased mobility, Decreased strength, Increased edema, Postural dysfunction  Visit Diagnosis: Primary osteoarthritis of right knee  Other abnormalities of gait and mobility  Pain in right hip     Problem List Patient Active Problem List   Diagnosis Date Noted  . Thrombocytopenia (HCC) 04/08/2019  . HTN (hypertension) 07/05/2018  . Tobacco use 07/05/2018  . S/P drug eluting coronary stent placement 03/15/2018  . Unstable angina (HCC) 02/24/2018  . NSTEMI (non-ST elevated myocardial infarction) (HCC) 02/24/2018  . Full thickness rotator cuff tear 07/31/2017    Janee Morn, SPT 05/11/2019, 3:32 PM  [place] [facility] [address], [place], [address] Phone: [phone]   Fax:  [phone]  Name: Karen Potts MRN: [id number] Date of Birth: 03/15/1961

## 2019-05-16 ENCOUNTER — Other Ambulatory Visit: Payer: Self-pay

## 2019-05-16 ENCOUNTER — Ambulatory Visit: Payer: Medicaid Other

## 2019-05-16 DIAGNOSIS — R2689 Other abnormalities of gait and mobility: Secondary | ICD-10-CM

## 2019-05-16 DIAGNOSIS — M1711 Unilateral primary osteoarthritis, right knee: Secondary | ICD-10-CM

## 2019-05-16 DIAGNOSIS — M25551 Pain in right hip: Secondary | ICD-10-CM

## 2019-05-16 NOTE — Addendum Note (Signed)
Addended by: Blain Pais on: 05/16/2019 02:53 PM   Modules accepted: Orders

## 2019-05-16 NOTE — Therapy (Addendum)
[facility]ICINE [address] [place], [address] Phone: [phone]   Fax:  [phone]  Physical Therapy Treatment  Patient Details  Name: Karen Potts MRN: [id number] Date of Birth: 1961-03-28 Referring Provider (PT): Toy Cookey MD   Encounter Date: 05/16/2019  PT End of Session - 05/16/19 1443    Visit Number  3    Number of Visits  17    Date for PT Re-Evaluation  06/29/19    Authorization Type  3/10    PT Start Time  1434    PT Stop Time  1515    PT Time Calculation (min)  41 min    Activity Tolerance  Other (comment)   Patient tolerated 2/3 Epley maneuvers well; voming episode after 3rd   Behavior During Therapy  [facility] for tasks assessed/performed       Past Medical History:  Diagnosis Date  . Diabetes mellitus without complication (HCC)   . Hypertension   . Sleep apnea   . Vertigo     Past Surgical History:  Procedure Laterality Date  . ABDOMINAL HYSTERECTOMY    . BREAST BIOPSY Left 10/2017    APOCRINE TYPE CYST WITH FIBROSIS Of the Wall  . CORONARY STENT INTERVENTION N/A 02/24/2018   Procedure: CORONARY STENT INTERVENTION;  Surgeon: Marcina Millard, MD;  Location: [facility] INVASIVE CV LAB;  Service: Cardiovascular;  Laterality: N/A;  . LEFT HEART CATH AND CORONARY ANGIOGRAPHY Right 02/24/2018   Procedure: LEFT HEART CATH AND CORONARY ANGIOGRAPHY;  Surgeon: Marcina Millard, MD;  Location: [facility] INVASIVE CV LAB;  Service: Cardiovascular;  Laterality: Right;  . SHOULDER ARTHROSCOPY WITH OPEN ROTATOR CUFF REPAIR Left 08/14/2017   Procedure: SHOULDER ARTHROSCOPY WITH ROTATOR CUFF REPAIR, BICEPS TENOTOMY, SUBACROMIAL DECOMPRESSION;  Surgeon: Lyndle Herrlich, MD;  Location: [facility] ORS;  Service: Orthopedics;  Laterality: Left;  . UMBILICAL HERNIA REPAIR      There were no vitals filed for this visit.   Subjective Assessment - 05/16/19 1453    Subjective  Patient reports MD suggesting  knee brace. She tried her husband's brace but it did not fit well.    Patient is accompained by:  Family member   Adult daughter   Pertinent History  Onset [name] 2020 during treadmill training as part of cardiac rehabilitation. Cortisone shots provide relief for "a couple months"; last shot in June, relief until September. Attempting to increase exercise for weight loss and cardiac health but currently able to walk about "one block" before requiring rest due to knee pain. Recently purchased [facility] for assistance with ambulation. Pain 5/10, 10/10 worst, 0/10 best, with pain in R hip and LBP with increased activity. Agg: walking, standing. Ease: Voltaren, Aspirin, rest. Does not bother her in non-WB. Comorbidities: CAD, HTN, smoker, occasional vertigo "comes and goes every few months", DM2; L shoulder rotator cuff repair with fair recovery.    Limitations  Walking;Standing    How long can you sit comfortably?  unlimited    How long can you stand comfortably?  5 min    How long can you walk comfortably?  20 min    Diagnostic tests  x-ray (per pt report; the cartilage was "gone" on B knees)    Patient Stated Goals  walk with less pain to lose weight    Pain Onset  More than a month ago       Patient reports she has dizziness and vertigo. Patient reports the vertigo onset was June 2019. Patient  reports she has been seen by Dr. Beverly Gust ,ENT physician, twice at the beginning of 2020. Patient states she had a hearing test which was negative per patient report. In addition, patient describes having had Dix-Hallpike testing and patient reports she was told she had "crystals loose in the ear". Patient reports they tried to treat it in the office , but patient states her symptoms keep coming back. Patient reports she was given an exercise to try at home. Patient reports lying down and turning her head to the right and quick head turns bring on her symptoms. Patient reports she also gets the sensation of her eyes  "jumping" when she gets an episode of vertigo.   Patient with negative left Dix-Hallpike test with no nystagmus observed and patient denied vertigo. Patient with positive right Dix-Hallpike test with right upbeating, torsional nystagmus of short duration. Performed 2 Epley maneuvers and attempted a third; however, patient became nauseous and had an episode of vomiting. Patient reported 7-8/10 vertigo with first maneuver and 10/10 with second maneuver. Of note when patient turned head from right to left side during Epley maneuver, left upbeating torsional nystagmus of short duration was observed. Consider retesting left Dix-Hallpike next session.  Patient rested after session and recommended that patient call someone to drive her home. Patient called her daughter, and her daughter will drive patient home.                 PT Education - 05/16/19 1442    Education Details  Education on BPPV and repositioning maneuvers; POC    Person(s) Educated  Patient    Methods  Explanation;Tactile cues;Verbal cues    Comprehension  Verbal cues required;Verbalized understanding;Tactile cues required       PT Short Term Goals - 05/16/19 1626      PT SHORT TERM GOAL #1   Title  Patient will be independent with HEP as adjunct to clinical therapy and to reduce number of visits.    Baseline  HEP given    Time  2    Period  Weeks    Status  New    Target Date  05/18/19      PT SHORT TERM GOAL #2   Title  Patient will demonstrate ability to perform correct gait pattern with SPC 100% of the time for improved energy conservation and functional capacity with ambulation and ADLs.    Baseline  Correct gait pattern 50% of the time    Time  2    Period  Weeks    Status  New    Target Date  05/18/19      PT SHORT TERM GOAL #3   Title  Patient will report 50% or greater improvement in her symptoms of dizziness and imbalance with provoking motions/positions    Baseline  Vertigo 8/10    Time  2     Period  Weeks    Status  New    Target Date  05/30/19        PT Long Term Goals - 05/16/19 1624      PT LONG TERM GOAL #1   Title  Patient will improve 10MW speed to 0.8 m/sec to achieve community ambulator status and reduce fall risks.    Baseline  0.69 m/sec with SPC    Time  8    Period  Weeks    Status  New      PT LONG TERM GOAL #2   Title  Patient will improve  LEFS score by 9 points to achieve MCID for reduced disability and improved function.    Baseline  LEFS 25    Time  8    Period  Weeks    Status  New      PT LONG TERM GOAL #3   Title  Patient will reduce worst pain to 6/10 to demonstrate reduced disability with ADLs and improved functional capacity.    Baseline  10/10 worst pain    Time  8    Period  Weeks    Status  New      PT LONG TERM GOAL #4   Title  Patient will report walking tolerance of 45 minutes for for return to cardiac walking program.    Baseline  walking tolerance 20 min    Time  8    Period  Weeks    Status  New      PT LONG TERM GOAL #5   Title  Patient will report no vertigo with provoking motions or positions in order to be able to perform ADLs and mobility tasks without symptoms.    Baseline  8/10 dizziness with provoking motions    Time  4    Period  Weeks    Status  New    Target Date  06/13/19      Additional Long Term Goals   Additional Long Term Goals  Yes            Plan - 05/16/19 1441    Clinical Impression Statement  Vestibular examination performed today per pt reporting symptoms last visit consistent with BPPV. Examination demonstrates positive Dix-Hallpike test on R with R torisional upbeating. Performed 2 Epley maneuvers for correction. Note also L torisional upbeating when pt turned head to L during subsequent Epley maneuver. Attempted a 3rd Epley but patient reported nausea and had a vomiting episode. Plan for canalith repositioning therapy. Patient will benefit from skilled physical therapy for reduced  dizziness/vertigo with ADLs and reduced pain in R knee.    Personal Factors and Comorbidities  Age;Comorbidity 3+;Fitness    Comorbidities  CAD, vertigo, HTN, L shoulder pain/weakness    Examination-Activity Limitations  Bathing;Locomotion Level;Bend;Squat;Stairs;Stand;Dressing    Examination-Participation Restrictions  Cleaning;Community Activity;Other   Exercise   Stability/Clinical Decision Making  Evolving/Moderate complexity    Rehab Potential  Good    PT Frequency  2x / week    PT Duration  8 weeks    PT Treatment/Interventions  ADLs/Self Care Home Management;Aquatic Therapy;Cryotherapy;Electrical Stimulation;Moist Heat;DME Instruction;Gait training;Stair training;Functional mobility training;Therapeutic activities;Therapeutic exercise;Balance training; Canalith Repositioning; Neuromuscular re-education;Patient/family education;Manual techniques;Passive range of motion;Dry needling;Energy conservation;Taping;Joint Manipulations    PT Next Visit Plan  Progress gait training and AROM therex    PT Home Exercise Plan  heel slides, SAQ    Consulted and Agree with Plan of Care  Patient       Patient will benefit from skilled therapeutic intervention in order to improve the following deficits and impairments:  Abnormal gait, Decreased coordination, Decreased range of motion, Difficulty walking, Decreased endurance, Obesity, Decreased activity tolerance, Pain, Decreased balance, Hypomobility, Improper body mechanics, Impaired flexibility, Decreased mobility, Decreased strength, Increased edema, Postural dysfunction  Visit Diagnosis: Primary osteoarthritis of right knee  Other abnormalities of gait and mobility  Pain in right hip     Problem List Patient Active Problem List   Diagnosis Date Noted  . Thrombocytopenia (HCC) 04/08/2019  . HTN (hypertension) 07/05/2018  . Tobacco use 07/05/2018  . S/P drug eluting coronary stent  placement 03/15/2018  . Unstable angina (HCC) 02/24/2018   . NSTEMI (non-ST elevated myocardial infarction) (HCC) 02/24/2018  . Full thickness rotator cuff tear 07/31/2017    Janee Morn, SPT 05/17/2019, 7:50 AM  [facility] PHYSICAL AND SPORTS MEDICINE [address] [place], [address] Phone: [phone]   Fax:  [phone]  Name: SHARNAY CASHION MRN: [id number] Date of Birth: 10-19-1960

## 2019-05-19 ENCOUNTER — Ambulatory Visit: Payer: Medicaid Other

## 2019-05-19 ENCOUNTER — Other Ambulatory Visit: Payer: Self-pay

## 2019-05-19 DIAGNOSIS — R2689 Other abnormalities of gait and mobility: Secondary | ICD-10-CM

## 2019-05-19 DIAGNOSIS — M25551 Pain in right hip: Secondary | ICD-10-CM

## 2019-05-19 DIAGNOSIS — M1711 Unilateral primary osteoarthritis, right knee: Secondary | ICD-10-CM

## 2019-05-19 NOTE — Therapy (Signed)
[facility]TS MEDICINE [address] [place], [address] Phone: [phone]   Fax:  [phone]  Physical Therapy Treatment/Progress Note  Patient Details  Name: Karen Potts MRN: [id number] Date of Birth: 24-Dec-1960 Referring Provider (PT): Toy Cookey MD  Reporting period: 05/04/2019 - 05/19/2019  Encounter Date: 05/19/2019  PT End of Session - 05/19/19 1349    Visit Number  4    Number of Visits  17    Date for PT Re-Evaluation  06/29/19    Authorization Type  4/10    PT Start Time  1346    PT Stop Time  1430    PT Time Calculation (min)  44 min    Activity Tolerance  Other (comment);Patient tolerated treatment well;Patient limited by fatigue;No increased pain   Treatment limited secondary to vertigo   Behavior During Therapy  [facility] for tasks assessed/performed       Past Medical History:  Diagnosis Date  . Diabetes mellitus without complication (HCC)   . Hypertension   . Sleep apnea   . Vertigo     Past Surgical History:  Procedure Laterality Date  . ABDOMINAL HYSTERECTOMY    . BREAST BIOPSY Left 10/2017    APOCRINE TYPE CYST WITH FIBROSIS Of the Wall  . CORONARY STENT INTERVENTION N/A 02/24/2018   Procedure: CORONARY STENT INTERVENTION;  Surgeon: Marcina Millard, MD;  Location: [facility] INVASIVE CV LAB;  Service: Cardiovascular;  Laterality: N/A;  . LEFT HEART CATH AND CORONARY ANGIOGRAPHY Right 02/24/2018   Procedure: LEFT HEART CATH AND CORONARY ANGIOGRAPHY;  Surgeon: Marcina Millard, MD;  Location: [facility] INVASIVE CV LAB;  Service: Cardiovascular;  Laterality: Right;  . SHOULDER ARTHROSCOPY WITH OPEN ROTATOR CUFF REPAIR Left 08/14/2017   Procedure: SHOULDER ARTHROSCOPY WITH ROTATOR CUFF REPAIR, BICEPS TENOTOMY, SUBACROMIAL DECOMPRESSION;  Surgeon: Lyndle Herrlich, MD;  Location: [facility] ORS;  Service: Orthopedics;  Laterality: Left;  . UMBILICAL HERNIA REPAIR      There were no vitals filed for  this visit.  Subjective Assessment - 05/19/19 1348    Subjective  Patient reports vertigo is still bothering her but knee is much better. Reports MD visit and that they want to take her off blood thinner.    Patient is accompained by:  Family member   Adult daughter   Pertinent History  Onset [name] 2020 during treadmill training as part of cardiac rehabilitation. Cortisone shots provide relief for "a couple months"; last shot in June, relief until September. Attempting to increase exercise for weight loss and cardiac health but currently able to walk about "one block" before requiring rest due to knee pain. Recently purchased [facility] for assistance with ambulation. Pain 5/10, 10/10 worst, 0/10 best, with pain in R hip and LBP with increased activity. Agg: walking, standing. Ease: Voltaren, Aspirin, rest. Does not bother her in non-WB. Comorbidities: CAD, HTN, smoker, occasional vertigo "comes and goes every few months", DM2; L shoulder rotator cuff repair with fair recovery.    Limitations  Walking;Standing    How long can you sit comfortably?  unlimited    How long can you stand comfortably?  5 min    How long can you walk comfortably?  20 min    Diagnostic tests  x-ray (per pt report; the cartilage was "gone" on B knees)    Patient Stated Goals  walk with less pain to lose weight    Currently in Pain?  No/denies    Pain Onset  More than a  month ago       TREATMENT   Assessed 10MWT = 0.74 m/sec  LAQ R x 10 Supine SLR R x 5, x4, x3 with 4 sec hold PPT in supine x 10 Bridges x 10 with UE support  Education regarding walking program and HEP  TE for reduction of pain and strengthening through RLE       PT Education - 05/19/19 1349    Education Details  form/technique with exercise; POC    Person(s) Educated  Patient    Methods  Explanation;Demonstration;Tactile cues;Verbal cues    Comprehension  Verbalized understanding;Returned demonstration;Verbal cues required;Tactile cues required        PT Short Term Goals - 05/19/19 1723      PT SHORT TERM GOAL #1   Title  Patient will be independent with HEP as adjunct to clinical therapy and to reduce number of visits.    Baseline  HEP given    Time  2    Period  Weeks    Status  Achieved    Target Date  05/18/19      PT SHORT TERM GOAL #2   Title  Patient will demonstrate ability to perform correct gait pattern with SPC 100% of the time for improved energy conservation and functional capacity with ambulation and ADLs.    Baseline  Correct gait pattern 50% of the time    Time  2    Period  Weeks    Status  Achieved    Target Date  05/18/19      PT SHORT TERM GOAL #3   Title  Patient will report 50% or greater improvement in her symptoms of dizziness and imbalance with provoking motions/positions    Baseline  Vertigo 8/10    Time  2    Period  Weeks    Status  On-going    Target Date  05/30/19        PT Long Term Goals - 05/19/19 1352      PT LONG TERM GOAL #1   Title  Patient will improve 10MW speed to 0.8 m/sec to achieve community ambulator status and reduce fall risks.    Baseline  0.69 m/sec with SPC; 11/19 0.74    Time  8    Period  Weeks    Status  Deferred      PT LONG TERM GOAL #2   Title  Patient will improve LEFS score by 9 points to achieve MCID for reduced disability and improved function.    Baseline  LEFS 25; 11/19 could not assess    Time  8    Period  Weeks    Status  Deferred      PT LONG TERM GOAL #3   Title  Patient will reduce worst pain to 6/10 to demonstrate reduced disability with ADLs and improved functional capacity.    Baseline  10/10 worst pain; 11/19 0/10   pain 0/10 following cortisol injection   Time  8    Period  Weeks    Status  Achieved      PT LONG TERM GOAL #4   Title  Patient will report walking tolerance of 45 minutes for for return to cardiac walking program.    Baseline  walking tolerance 20 min; 11/19 20 min secondary to vertigo    Time  8    Period  Weeks     Status  Deferred      PT LONG TERM GOAL #5   Title  Patient will report no vertigo with provoking motions or positions in order to be able to perform ADLs and mobility tasks without symptoms.    Baseline  8/10 dizziness with provoking motions; 11/19 8/10    Time  4    Period  Weeks    Status  On-going            Plan - 05/19/19 1715    Clinical Impression Statement  Treatment today limited by patient vertigo; patient challenged to tolerate transition sit <> supine requiring several minutes after transition for vertigo to resolve. Patient otherwise demonstrated quick muscular fatigue with LE exercise but no pain. Ambulation improved since first visit with sequencing with cane. At this time patient feels vertigo is a greater problem than the knee as vertigo is limiting driving and transfers; PT in accord as vertigo presents greater fall risk and limits therapeutic interventions. Patient to pursue further physical therapy for vertigo [facility] while continuing HEP/walking program for knee.    Personal Factors and Comorbidities  Age;Comorbidity 3+;Fitness    Comorbidities  CAD, vertigo, HTN, L shoulder pain/weakness    Examination-Activity Limitations  Bathing;Locomotion Level;Bend;Squat;Stairs;Stand;Dressing    Examination-Participation Restrictions  Cleaning;Community Activity;Other   Exercise   Stability/Clinical Decision Making  Evolving/Moderate complexity    Rehab Potential  Good    PT Frequency  2x / week    PT Duration  8 weeks    PT Treatment/Interventions  ADLs/Self Care Home Management;Aquatic Therapy;Cryotherapy;Electrical Stimulation;Moist Heat;DME Instruction;Gait training;Stair training;Functional mobility training;Therapeutic activities;Therapeutic exercise;Balance training;Neuromuscular re-education;Patient/family education;Manual techniques;Passive range of motion;Dry needling;Energy conservation;Taping;Joint Manipulations    PT Next Visit  Plan  Treatment for vertigo    PT Home Exercise Plan  Heel slides, SAQ, SLR, walking x 10 min/day    Consulted and Agree with Plan of Care  Patient       Patient will benefit from skilled therapeutic intervention in order to improve the following deficits and impairments:  Abnormal gait, Decreased coordination, Decreased range of motion, Difficulty walking, Decreased endurance, Obesity, Decreased activity tolerance, Pain, Decreased balance, Hypomobility, Improper body mechanics, Impaired flexibility, Decreased mobility, Decreased strength, Increased edema, Postural dysfunction  Visit Diagnosis: Primary osteoarthritis of right knee  Other abnormalities of gait and mobility  Pain in right hip     Problem List Patient Active Problem List   Diagnosis Date Noted  . Thrombocytopenia (HCC) 04/08/2019  . HTN (hypertension) 07/05/2018  . Tobacco use 07/05/2018  . S/P drug eluting coronary stent placement 03/15/2018  . Unstable angina (HCC) 02/24/2018  . NSTEMI (non-ST elevated myocardial infarction) (HCC) 02/24/2018  . Full thickness rotator cuff tear 07/31/2017    Janee Mornory Jayvan Mcshan, SPT 05/19/2019, 8:51 PM  [facility] [address] [place], [place], [address] Phone: [phone]   Fax:  [phone]  Name: Karen Potts MRN: [id number] Date of Birth: [birth year]

## 2019-05-23 ENCOUNTER — Ambulatory Visit: Payer: Medicaid Other

## 2019-05-25 ENCOUNTER — Ambulatory Visit: Payer: Medicaid Other

## 2019-05-30 ENCOUNTER — Ambulatory Visit: Payer: Medicaid Other

## 2019-06-02 ENCOUNTER — Ambulatory Visit: Payer: Medicaid Other | Attending: Family Medicine

## 2019-06-02 ENCOUNTER — Other Ambulatory Visit: Payer: Self-pay

## 2019-06-02 DIAGNOSIS — M25551 Pain in right hip: Secondary | ICD-10-CM

## 2019-06-02 DIAGNOSIS — M1711 Unilateral primary osteoarthritis, right knee: Secondary | ICD-10-CM | POA: Diagnosis present

## 2019-06-02 DIAGNOSIS — R42 Dizziness and giddiness: Secondary | ICD-10-CM | POA: Diagnosis present

## 2019-06-02 DIAGNOSIS — R2689 Other abnormalities of gait and mobility: Secondary | ICD-10-CM | POA: Diagnosis present

## 2019-06-02 NOTE — Therapy (Signed)
[place] [facility] [address] [place], [address] Phone: [phone]   Fax:  [phone]  Physical Therapy Treatment  Patient Details  Name: Karen Potts MRN: [id number] Date of Birth: 11-Jun-1961 Referring Provider (PT): Gennette Pac MD   Encounter Date: 06/02/2019  PT End of Session - 06/02/19 1700    Visit Number  5    Number of Visits  17    Date for PT Re-Evaluation  06/29/19    Authorization Type  5/10    PT Start Time  1702    PT Stop Time  3716    PT Time Calculation (min)  43 min    Activity Tolerance  Other (comment);Patient tolerated treatment well;Patient limited by fatigue;No increased pain   Treatment limited secondary to vertigo   Behavior During Therapy  [facility] for tasks assessed/performed       Past Medical History:  Diagnosis Date  . Diabetes mellitus without complication ([place])   . Hypertension   . Sleep apnea   . Vertigo     Past Surgical History:  Procedure Laterality Date  . ABDOMINAL HYSTERECTOMY    . BREAST BIOPSY Left 10/2017    APOCRINE TYPE CYST WITH FIBROSIS Of the Wall  . CORONARY STENT INTERVENTION N/A 02/24/2018   Procedure: CORONARY STENT INTERVENTION;  Surgeon: Isaias Cowman, MD;  Location: [place] [facility];  Service: Cardiovascular;  Laterality: N/A;  . LEFT HEART CATH AND CORONARY ANGIOGRAPHY Right 02/24/2018   Procedure: LEFT HEART CATH AND CORONARY ANGIOGRAPHY;  Surgeon: Isaias Cowman, MD;  Location: [place] [facility];  Service: Cardiovascular;  Laterality: Right;  . SHOULDER ARTHROSCOPY WITH OPEN ROTATOR CUFF REPAIR Left 08/14/2017   Procedure: SHOULDER ARTHROSCOPY WITH ROTATOR CUFF REPAIR, BICEPS TENOTOMY, SUBACROMIAL DECOMPRESSION;  Surgeon: Lovell Sheehan, MD;  Location: [facility];  Service: Orthopedics;  Laterality: Left;  . UMBILICAL HERNIA REPAIR      There were no vitals filed for this visit.  Subjective Assessment - 06/02/19 1706     Subjective  Patient reports that vertigo has not receded. Reports very sore after last session with spasm in medial thigh and pain in knee. Went to MD and Rx for muscle relaxer, rest, sciatic nerve stretch. Vertigo remains.    Patient is accompained by:  Family member   Adult daughter   Pertinent History  Onset [name] 2020 during treadmill training as part of cardiac rehabilitation. Cortisone shots provide relief for "a couple months"; last shot in June, relief until September. Attempting to increase exercise for weight loss and cardiac health but currently able to walk about "one block" before requiring rest due to knee pain. Recently purchased [facility] for assistance with ambulation. Pain 5/10, 10/10 worst, 0/10 best, with pain in R hip and LBP with increased activity. Agg: walking, standing. Ease: Voltaren, Aspirin, rest. Does not bother her in non-WB. Comorbidities: CAD, HTN, smoker, occasional vertigo "comes and goes every few months", DM2; L shoulder rotator cuff repair with fair recovery.    Limitations  Walking;Standing    How long can you sit comfortably?  unlimited    How long can you stand comfortably?  5 min    How long can you walk comfortably?  20 min    Diagnostic tests  x-ray (per pt report; the cartilage was "gone" on B knees)    Patient Stated Goals  walk with less pain to lose weight    Currently in Pain?  Yes    Pain  Score  8     Pain Location  Leg    Pain Orientation  Right    Pain Descriptors / Indicators  Aching;Cramping;Spasm;Sore    Pain Type  Acute pain;Chronic pain    Pain Onset  1 to 4 weeks ago    Multiple Pain Sites  No         TREATMENT   Observation: Note antalgic gait over R  MT Soft tissue mobilization and myofascial release to R quads adductors x 8 min Manual adductor stretch x 30 sec    MT for relief of pain and spasm for improved tolerance with ADLs.  Patient reports no change in symptoms  TE Seated sciatic nerve tensioner and slider x 10  each Contract-relax stretch for adductor x 5 Seated R hip abd/add x 20 Adductor ball squeeze 50% x 20 - adapted for HEP RLE knee flexion/extension over ball x 10 Standing weight shift with UE assist x 40 Prayer stretch in seated over theraball 5 sec hold x 10 for relief of pain and spasm in R low back - adapted for HEP  TE for relief of pain and spasm for improved tolerance with ADLs   Following tx: pain 1/614/10 with reduced gait deviations       PT Education - 06/02/19 1700    Education Details  form/technique    Person(s) Educated  Patient    Methods  Explanation;Demonstration;Tactile cues;Verbal cues    Comprehension  Verbalized understanding;Returned demonstration;Verbal cues required;Tactile cues required       PT Short Term Goals - 05/19/19 1723      PT SHORT TERM GOAL #1   Title  Patient will be independent with HEP as adjunct to clinical therapy and to reduce number of visits.    Baseline  HEP given    Time  2    Period  Weeks    Status  Achieved    Target Date  05/18/19      PT SHORT TERM GOAL #2   Title  Patient will demonstrate ability to perform correct gait pattern with SPC 100% of the time for improved energy conservation and functional capacity with ambulation and ADLs.    Baseline  Correct gait pattern 50% of the time    Time  2    Period  Weeks    Status  Achieved    Target Date  05/18/19      PT SHORT TERM GOAL #3   Title  Patient will report 50% or greater improvement in her symptoms of dizziness and imbalance with provoking motions/positions    Baseline  Vertigo 8/10    Time  2    Period  Weeks    Status  On-going    Target Date  05/30/19        PT Long Term Goals - 05/19/19 1352      PT LONG TERM GOAL #1   Title  Patient will improve 10MW speed to 0.8 m/sec to achieve community ambulator status and reduce fall risks.    Baseline  0.69 m/sec with SPC; 11/19 0.74    Time  8    Period  Weeks    Status  Deferred      PT LONG TERM GOAL #2    Title  Patient will improve LEFS score by 9 points to achieve MCID for reduced disability and improved function.    Baseline  LEFS 25; 11/19 could not assess    Time  8    Period  Weeks    Status  Deferred      PT LONG TERM GOAL #3   Title  Patient will reduce worst pain to 6/10 to demonstrate reduced disability with ADLs and improved functional capacity.    Baseline  10/10 worst pain; 11/19 0/10   pain 0/10 following cortisol injection   Time  8    Period  Weeks    Status  Achieved      PT LONG TERM GOAL #4   Title  Patient will report walking tolerance of 45 minutes for for return to cardiac walking program.    Baseline  walking tolerance 20 min; 11/19 20 min secondary to vertigo    Time  8    Period  Weeks    Status  Deferred      PT LONG TERM GOAL #5   Title  Patient will report no vertigo with provoking motions or positions in order to be able to perform ADLs and mobility tasks without symptoms.    Baseline  8/10 dizziness with provoking motions; 11/19 8/10    Time  4    Period  Weeks    Status  On-going            Plan - 06/02/19 1809    Clinical Impression Statement  Patient presents today reporting markedly elevated pain since last session (two weeks ago). Presentation suggests myogenic pain and spasm. Manual treatment provided with unclear efficacy but spasm and pain markedly reduced with gentle isometrics and low-velocity mobilization in reduced range. Patient reports reduced pain at end of session and leaves with improved gait pattern. Plan to continue POC with lower-intensity graded therapeutic exercise. Patient will benefit from skilled physical therapy to reduce pain and improve function with ambulation.    Personal Factors and Comorbidities  Age;Comorbidity 3+;Fitness    Comorbidities  CAD, vertigo, HTN, L shoulder pain/weakness    Examination-Activity Limitations  Bathing;Locomotion Level;Bend;Squat;Stairs;Stand;Dressing    Examination-Participation  Restrictions  Cleaning;Community Activity;Other   Exercise   Stability/Clinical Decision Making  Evolving/Moderate complexity    Rehab Potential  Good    PT Frequency  2x / week    PT Duration  8 weeks    PT Treatment/Interventions  ADLs/Self Care Home Management;Aquatic Therapy;Cryotherapy;Electrical Stimulation;Moist Heat;DME Instruction;Gait training;Stair training;Functional mobility training;Therapeutic activities;Therapeutic exercise;Balance training;Neuromuscular re-education;Patient/family education;Manual techniques;Passive range of motion;Dry needling;Energy conservation;Taping;Joint Manipulations    PT Next Visit Plan  Treatment for vertigo    PT Home Exercise Plan  Heel slides, SAQ, adductor isometric, prayer stretch in seated    Consulted and Agree with Plan of Care  Patient       Patient will benefit from skilled therapeutic intervention in order to improve the following deficits and impairments:  Abnormal gait, Decreased coordination, Decreased range of motion, Difficulty walking, Decreased endurance, Obesity, Decreased activity tolerance, Pain, Decreased balance, Hypomobility, Improper body mechanics, Impaired flexibility, Decreased mobility, Decreased strength, Increased edema, Postural dysfunction  Visit Diagnosis: Primary osteoarthritis of right knee  Other abnormalities of gait and mobility  Pain in right hip     Problem List Patient Active Problem List   Diagnosis Date Noted  . Thrombocytopenia (HCC) 04/08/2019  . HTN (hypertension) 07/05/2018  . Tobacco use 07/05/2018  . S/P drug eluting coronary stent placement 03/15/2018  . Unstable angina (HCC) 02/24/2018  . NSTEMI (non-ST elevated myocardial infarction) (HCC) 02/24/2018  . Full thickness rotator cuff tear 07/31/2017    Janee Morn, SPT 06/02/2019, 6:25 PM  [facility] [address] [place], [address] Phone: [phone]   Fax:   [phone]  Name: Karen Potts MRN: [id number] Date of Birth: 03-Jun-1961

## 2019-06-06 ENCOUNTER — Other Ambulatory Visit: Payer: Self-pay

## 2019-06-06 ENCOUNTER — Ambulatory Visit: Payer: Medicaid Other

## 2019-06-06 DIAGNOSIS — M25551 Pain in right hip: Secondary | ICD-10-CM

## 2019-06-06 DIAGNOSIS — M1711 Unilateral primary osteoarthritis, right knee: Secondary | ICD-10-CM

## 2019-06-06 DIAGNOSIS — R2689 Other abnormalities of gait and mobility: Secondary | ICD-10-CM

## 2019-06-06 NOTE — Therapy (Signed)
[facility] [address] [place], [place], [address] Phone: [phone]   Fax:  [phone]  Physical Therapy Treatment  Patient Details  Name: Karen SlickerJanifer R Stella MRN: [id number] Date of Birth: 1960/08/25 Referring Provider (PT): Toy CookeyHeadrick, Emily MD   Encounter Date: 06/06/2019  PT End of Session - 06/06/19 0908    Visit Number  6    Number of Visits  17    Date for PT Re-Evaluation  06/29/19    Authorization Type  6/10    PT Start Time  0902    PT Stop Time  0945    PT Time Calculation (min)  43 min    Activity Tolerance  Other (comment);Patient tolerated treatment well;No increased pain   Treatment limited secondary to vertigo   Behavior During Therapy  [facility] for tasks assessed/performed       Past Medical History:  Diagnosis Date  . Diabetes mellitus without complication (HCC)   . Hypertension   . Sleep apnea   . Vertigo     Past Surgical History:  Procedure Laterality Date  . ABDOMINAL HYSTERECTOMY    . BREAST BIOPSY Left 10/2017    APOCRINE TYPE CYST WITH FIBROSIS Of the Wall  . CORONARY STENT INTERVENTION N/A 02/24/2018   Procedure: CORONARY STENT INTERVENTION;  Surgeon: Marcina MillardParaschos, Alexander, MD;  Location: [facility];  Service: Cardiovascular;  Laterality: N/A;  . LEFT HEART CATH AND CORONARY ANGIOGRAPHY Right 02/24/2018   Procedure: LEFT HEART CATH AND CORONARY ANGIOGRAPHY;  Surgeon: Marcina MillardParaschos, Alexander, MD;  Location: [facility];  Service: Cardiovascular;  Laterality: Right;  . SHOULDER ARTHROSCOPY WITH OPEN ROTATOR CUFF REPAIR Left 08/14/2017   Procedure: SHOULDER ARTHROSCOPY WITH ROTATOR CUFF REPAIR, BICEPS TENOTOMY, SUBACROMIAL DECOMPRESSION;  Surgeon: Lyndle HerrlichBowers, James R, MD;  Location: [facility];  Service: Orthopedics;  Laterality: Left;  . UMBILICAL HERNIA REPAIR      There were no vitals filed for this visit.  Subjective Assessment - 06/06/19 0903    Subjective  Patient reports  feeling good today but was sore after last session. Did not sleep well due to muscle spasms after last session but these have since receded.    Patient is accompained by:  --   Adult daughter   Pertinent History  Onset [name] 2020 during treadmill training as part of cardiac rehabilitation. Cortisone shots provide relief for "a couple months"; last shot in June, relief until September. Attempting to increase exercise for weight loss and cardiac health but currently able to walk about "one block" before requiring rest due to knee pain. [facility] with ambulation. Pain 5/10, 10/10 worst, 0/10 best, with pain in R hip and LBP with increased activity. Agg: walking, standing. Ease: Voltaren, Aspirin, rest. Does not bother her in non-WB. Comorbidities: CAD, HTN, smoker, occasional vertigo "comes and goes every few months", DM2; L shoulder rotator cuff repair with fair recovery.    Limitations  Walking;Standing    How long can you sit comfortably?  unlimited    How long can you stand comfortably?  5 min    How long can you walk comfortably?  20 min    Diagnostic tests  x-ray (per pt report; the cartilage was "gone" on B knees)    Patient Stated Goals  walk with less pain to lose weight    Currently in Pain?  Yes    Pain Score  4     Pain Location  Leg  Pain Orientation  Right    Pain Type  Chronic pain    Pain Onset  More than a month ago      TREATMENT  TE Ball rolls with foot knee flexion/extension x 30 with cues for full excursion BLE seated adductor squeezes with 5 sec hold x 20 BLE seated abduction against green theraband resistance x 20 RLE seated circumduction AROM on slider x 20 cw/ccw Seated prayer stretch over theraball center, R/L 5 sec hold x 5 Prayer stretch to R 10 sec hold x 3 Trunk rotations x 20 R/L Lower trunk rotation in standing over spin discs Seated marches x 10 R/L  TE for pain relief, gentle mobilization and light strengthening of  RLE   MT Soft tissue mobilization and myofascial release to R quad, adductors, glute med focus over vastus lateralis  MT for pain relief and reduction of spasm            PT Education - 06/06/19 0904    Education Details  form/technique    Person(s) Educated  Patient    Methods  Explanation;Demonstration;Tactile cues;Verbal cues    Comprehension  Verbalized understanding;Returned demonstration;Verbal cues required;Tactile cues required       PT Short Term Goals - 05/19/19 1723      PT SHORT TERM GOAL #1   Title  Patient will be independent with HEP as adjunct to clinical therapy and to reduce number of visits.    Baseline  HEP given    Time  2    Period  Weeks    Status  Achieved    Target Date  05/18/19      PT SHORT TERM GOAL #2   Title  Patient will demonstrate ability to perform correct gait pattern with SPC 100% of the time for improved energy conservation and functional capacity with ambulation and ADLs.    Baseline  Correct gait pattern 50% of the time    Time  2    Period  Weeks    Status  Achieved    Target Date  05/18/19      PT SHORT TERM GOAL #3   Title  Patient will report 50% or greater improvement in her symptoms of dizziness and imbalance with provoking motions/positions    Baseline  Vertigo 8/10    Time  2    Period  Weeks    Status  On-going    Target Date  05/30/19        PT Long Term Goals - 05/19/19 1352      PT LONG TERM GOAL #1   Title  Patient will improve 10MW speed to 0.8 m/sec to achieve community ambulator status and reduce fall risks.    Baseline  0.69 m/sec with SPC; 11/19 0.74    Time  8    Period  Weeks    Status  Deferred      PT LONG TERM GOAL #2   Title  Patient will improve LEFS score by 9 points to achieve MCID for reduced disability and improved function.    Baseline  LEFS 25; 11/19 could not assess    Time  8    Period  Weeks    Status  Deferred      PT LONG TERM GOAL #3   Title  Patient will reduce worst  pain to 6/10 to demonstrate reduced disability with ADLs and improved functional capacity.    Baseline  10/10 worst pain; 11/19 0/10   pain 0/10 following cortisol  injection   Time  8    Period  Weeks    Status  Achieved      PT LONG TERM GOAL #4   Title  Patient will report walking tolerance of 45 minutes for for return to cardiac walking program.    Baseline  walking tolerance 20 min; 11/19 20 min secondary to vertigo    Time  8    Period  Weeks    Status  Deferred      PT LONG TERM GOAL #5   Title  Patient will report no vertigo with provoking motions or positions in order to be able to perform ADLs and mobility tasks without symptoms.    Baseline  8/10 dizziness with provoking motions; 11/19 8/10    Time  4    Period  Weeks    Status  On-going            Plan - 06/06/19 0943    Clinical Impression Statement  Patient presents today with residual pain and spasm, much improved from last visit. Patient is able to tolerate manual therapy and light therex but quick fatigue is noted. No c/o sciatic pain. Patient's pain reduced and quality of movement with gait continues to improve. Patient will benefit from skilled physical therapy to reduce OA knee pain and improve RLE functional capacity with ADLs.    Personal Factors and Comorbidities  Age;Comorbidity 3+;Fitness    Comorbidities  CAD, vertigo, HTN, L shoulder pain/weakness    Examination-Activity Limitations  Bathing;Locomotion Level;Bend;Squat;Stairs;Stand;Dressing    Examination-Participation Restrictions  Cleaning;Community Activity;Other   Exercise   Stability/Clinical Decision Making  Evolving/Moderate complexity    Rehab Potential  Good    PT Frequency  2x / week    PT Duration  8 weeks    PT Treatment/Interventions  ADLs/Self Care Home Management;Aquatic Therapy;Cryotherapy;Electrical Stimulation;Moist Heat;DME Instruction;Gait training;Stair training;Functional mobility training;Therapeutic activities;Therapeutic  exercise;Balance training;Neuromuscular re-education;Patient/family education;Manual techniques;Passive range of motion;Dry needling;Energy conservation;Taping;Joint Manipulations    PT Next Visit Plan  Treatment for vertigo    PT Home Exercise Plan  Heel slides, SAQ, adductor isometric, prayer stretch in seated    Consulted and Agree with Plan of Care  Patient       Patient will benefit from skilled therapeutic intervention in order to improve the following deficits and impairments:  Abnormal gait, Decreased coordination, Decreased range of motion, Difficulty walking, Decreased endurance, Obesity, Decreased activity tolerance, Pain, Decreased balance, Hypomobility, Improper body mechanics, Impaired flexibility, Decreased mobility, Decreased strength, Increased edema, Postural dysfunction  Visit Diagnosis: Primary osteoarthritis of right knee  Other abnormalities of gait and mobility  Pain in right hip     Problem List Patient Active Problem List   Diagnosis Date Noted  . Thrombocytopenia (HCC) 04/08/2019  . HTN (hypertension) 07/05/2018  . Tobacco use 07/05/2018  . S/P drug eluting coronary stent placement 03/15/2018  . Unstable angina (HCC) 02/24/2018  . NSTEMI (non-ST elevated myocardial infarction) (HCC) 02/24/2018  . Full thickness rotator cuff tear 07/31/2017    Janee Morn, SPT 06/06/2019, 12:18 PM  [facility] AND SPORTS MEDICINE [address] [place], [address] Phone: [phone]   Fax:  [phone]  Name: SHERAL PFAHLER MRN: [id number] Date of Birth: 1961-03-03

## 2019-06-07 ENCOUNTER — Ambulatory Visit: Payer: Medicaid Other | Admitting: Physical Therapy

## 2019-06-07 ENCOUNTER — Encounter: Payer: Self-pay | Admitting: Physical Therapy

## 2019-06-07 DIAGNOSIS — M1711 Unilateral primary osteoarthritis, right knee: Secondary | ICD-10-CM | POA: Diagnosis not present

## 2019-06-07 DIAGNOSIS — R42 Dizziness and giddiness: Secondary | ICD-10-CM

## 2019-06-07 NOTE — Addendum Note (Signed)
Addended by: Blain Pais on: 06/07/2019 01:16 PM   Modules accepted: Orders

## 2019-06-07 NOTE — Therapy (Signed)
[facility] MAIN [facility] SERVICES [address] [place], [place], [address] Phone: [phone]   Fax:  [phone]  Physical Therapy Treatment  Patient Details  Name: Karen Potts MRN: [id number] Date of Birth: 1961/05/24 Referring Provider (PT): Toy Cookey MD   Encounter Date: 06/07/2019  PT End of Session - 06/07/19 0821    Visit Number  7    Number of Visits  17    Date for PT Re-Evaluation  06/29/19    Authorization Type  7/10    PT Start Time  0815    PT Stop Time  0903    PT Time Calculation (min)  48 min    Activity Tolerance  Other (comment);Patient tolerated treatment well;No increased pain   Treatment limited secondary to vertigo   Behavior During Therapy  [facility] for tasks assessed/performed       Past Medical History:  Diagnosis Date  . Diabetes mellitus without complication (HCC)   . Hypertension   . Sleep apnea   . Vertigo     Past Surgical History:  Procedure Laterality Date  . ABDOMINAL HYSTERECTOMY    . BREAST BIOPSY Left 10/2017    APOCRINE TYPE CYST WITH FIBROSIS Of the Wall  . CORONARY STENT INTERVENTION N/A 02/24/2018   Procedure: CORONARY STENT INTERVENTION;  Surgeon: Marcina Millard, MD;  Location: [facility] INVASIVE CV LAB;  Service: Cardiovascular;  Laterality: N/A;  . LEFT HEART CATH AND CORONARY ANGIOGRAPHY Right 02/24/2018   Procedure: LEFT HEART CATH AND CORONARY ANGIOGRAPHY;  Surgeon: Marcina Millard, MD;  Location: [facility] INVASIVE CV LAB;  Service: Cardiovascular;  Laterality: Right;  . SHOULDER ARTHROSCOPY WITH OPEN ROTATOR CUFF REPAIR Left 08/14/2017   Procedure: SHOULDER ARTHROSCOPY WITH ROTATOR CUFF REPAIR, BICEPS TENOTOMY, SUBACROMIAL DECOMPRESSION;  Surgeon: Lyndle Herrlich, MD;  Location: [facility] ORS;  Service: Orthopedics;  Laterality: Left;  . UMBILICAL HERNIA REPAIR      There were no vitals filed for this visit.  Subjective Assessment - 06/07/19 0820    Subjective  Patient states reports  she still  has vertigo. Patient states she tries to avoid lying on her right side.    Patient is accompained by:  --   Adult daughter   Pertinent History  Onset [name] 2020 during treadmill training as part of cardiac rehabilitation. Cortisone shots provide relief for "a couple months"; last shot in June, relief until September. Attempting to increase exercise for weight loss and cardiac health but currently able to walk about "one block" before requiring rest due to knee pain. Recently purchased [facility] for assistance with ambulation. Pain 5/10, 10/10 worst, 0/10 best, with pain in R hip and LBP with increased activity. Agg: walking, standing. Ease: Voltaren, Aspirin, rest. Does not bother her in non-WB. Comorbidities: CAD, HTN, smoker, occasional vertigo "comes and goes every few months", DM2; L shoulder rotator cuff repair with fair recovery.    Limitations  Walking;Standing    How long can you sit comfortably?  unlimited    How long can you stand comfortably?  5 min    How long can you walk comfortably?  20 min    Diagnostic tests  x-ray (per pt report; the cartilage was "gone" on B knees)    Patient Stated Goals  walk with less pain to lose weight    Pain Onset  More than a month ago       Patient denies dizziness upon arrival.    Canalith Repositioning Manuever: On mat table, performed right Dix-Hallpike  testing and it was positive with right upbeating torsional nystagmus of short duration without latency. Performed right canalith repositioning maneuver (CRT). Repeated right CRT for a total of 2 maneuvers with retesting between maneuvers. Patient required several minutes rest break between maneuvers. Performed 2 maneuvers this date per patient's tolerance. Noted less vigorous nystagmus with second test that dissipated more quickly. Patient reported mild vertigo with turning head from right head rotation to left rotation position during the Epley which could be indicative of left BPPV. Will plan on  testing left Dix-Hallpike once right side is clear.  Patient reports 10/10 dizziness with both tests.    PT Education - 06/07/19 0820    Education Details  discussed BPPV and Epley maneuver; provided BPPV handout from [email or web address]; showed video of nystagmus    Person(s) Educated  Patient    Methods  Explanation;Handout;Verbal cues    Comprehension  Verbalized understanding       PT Short Term Goals - 05/19/19 1723      PT SHORT TERM GOAL #1   Title  Patient will be independent with HEP as adjunct to clinical therapy and to reduce number of visits.    Baseline  HEP given    Time  2    Period  Weeks    Status  Achieved    Target Date  05/18/19      PT SHORT TERM GOAL #2   Title  Patient will demonstrate ability to perform correct gait pattern with SPC 100% of the time for improved energy conservation and functional capacity with ambulation and ADLs.    Baseline  Correct gait pattern 50% of the time    Time  2    Period  Weeks    Status  Achieved    Target Date  05/18/19      PT SHORT TERM GOAL #3   Title  Patient will report 50% or greater improvement in her symptoms of dizziness and imbalance with provoking motions/positions    Baseline  Vertigo 8/10    Time  2    Period  Weeks    Status  On-going    Target Date  05/30/19        PT Long Term Goals - 05/19/19 1352      PT LONG TERM GOAL #1   Title  Patient will improve 10MW speed to 0.8 m/sec to achieve community ambulator status and reduce fall risks.    Baseline  0.69 m/sec with SPC; 11/19 0.74    Time  8    Period  Weeks    Status  Deferred      PT LONG TERM GOAL #2   Title  Patient will improve LEFS score by 9 points to achieve MCID for reduced disability and improved function.    Baseline  LEFS 25; 11/19 could not assess    Time  8    Period  Weeks    Status  Deferred      PT LONG TERM GOAL #3   Title  Patient will reduce worst pain to 6/10 to demonstrate reduced disability with ADLs and improved  functional capacity.    Baseline  10/10 worst pain; 11/19 0/10   pain 0/10 following cortisol injection   Time  8    Period  Weeks    Status  Achieved      PT LONG TERM GOAL #4   Title  Patient will report walking tolerance of 45 minutes for for return to cardiac walking program.  Baseline  walking tolerance 20 min; 11/19 20 min secondary to vertigo    Time  8    Period  Weeks    Status  Deferred      PT LONG TERM GOAL #5   Title  Patient will report no vertigo with provoking motions or positions in order to be able to perform ADLs and mobility tasks without symptoms.    Baseline  8/10 dizziness with provoking motions; 11/19 8/10    Time  4    Period  Weeks    Status  On-going            Plan - 06/07/19 1234    Clinical Impression Statement  Patient presents to clinic with reports of continued issues with vertigo and dizziness. Patient with positive right DIx-Hallpike test and tolerated two Epley maneuvers this date. Patient would benefit from continued PT services to address her right knee pain as well as vestibular BPPV deficits as this impacts patients balance and falls risk.    Personal Factors and Comorbidities  Age;Comorbidity 3+;Fitness    Comorbidities  CAD, vertigo, HTN, L shoulder pain/weakness    Examination-Activity Limitations  Bathing;Locomotion Level;Bend;Squat;Stairs;Stand;Dressing    Examination-Participation Restrictions  Cleaning;Community Activity;Other   Exercise   Stability/Clinical Decision Making  Evolving/Moderate complexity    Rehab Potential  Good    PT Frequency  2x / week    PT Duration  8 weeks    PT Treatment/Interventions  ADLs/Self Care Home Management;Aquatic Therapy;Cryotherapy;Electrical Stimulation;Moist Heat;DME Instruction;Gait training;Stair training;Functional mobility training;Therapeutic activities;Therapeutic exercise;Balance training;Neuromuscular re-education;Patient/family education;Manual techniques;Passive range of motion;Dry  needling;Energy conservation;Taping;Joint Manipulations;Canalith Repostioning    PT Next Visit Plan  Treatment for vertigo    PT Home Exercise Plan  Heel slides, SAQ, adductor isometric, prayer stretch in seated    Consulted and Agree with Plan of Care  Patient       Patient will benefit from skilled therapeutic intervention in order to improve the following deficits and impairments:  Abnormal gait, Decreased coordination, Decreased range of motion, Difficulty walking, Decreased endurance, Obesity, Decreased activity tolerance, Pain, Decreased balance, Hypomobility, Improper body mechanics, Impaired flexibility, Decreased mobility, Decreased strength, Increased edema, Postural dysfunction  Visit Diagnosis: Dizziness and giddiness     Problem List Patient Active Problem List   Diagnosis Date Noted  . Thrombocytopenia (HCC) 04/08/2019  . HTN (hypertension) 07/05/2018  . Tobacco use 07/05/2018  . S/P drug eluting coronary stent placement 03/15/2018  . Unstable angina (HCC) 02/24/2018  . NSTEMI (non-ST elevated myocardial infarction) (HCC) 02/24/2018  . Full thickness rotator cuff tear 07/31/2017   Mardelle Matteorriea Kinzy Weyers PT, DPT [id number] Mardelle Matteorriea Gwyneth Fernandez 06/08/2019, 9:06 AM   [facility] [address] [place], [place], [address] Phone: [phone]   Fax:  [phone]  Name: Karen Potts MRN: [id number] Date of Birth: 16-Dec-1960

## 2019-06-13 ENCOUNTER — Ambulatory Visit: Payer: Medicaid Other

## 2019-06-13 ENCOUNTER — Other Ambulatory Visit: Payer: Self-pay

## 2019-06-13 DIAGNOSIS — M1711 Unilateral primary osteoarthritis, right knee: Secondary | ICD-10-CM

## 2019-06-13 DIAGNOSIS — M25551 Pain in right hip: Secondary | ICD-10-CM

## 2019-06-13 DIAGNOSIS — R2689 Other abnormalities of gait and mobility: Secondary | ICD-10-CM

## 2019-06-13 NOTE — Therapy (Signed)
[facility] PHYSICAL AND SPORTS MEDICINE [address] , [place], [address] Phone: [phone]   Fax:  [phone]  Physical Therapy Treatment  Patient Details  Name: Karen Potts MRN: [id number] Date of Birth: Feb 26, 1961 Referring Provider (PT): Toy CookeyHeadrick, Emily MD   Encounter Date: 06/13/2019  PT End of Session - 06/13/19 0948    Visit Number  8    Number of Visits  17    Date for PT Re-Evaluation  06/29/19    Authorization Type  8/10    PT Start Time  0903    PT Stop Time  0948    PT Time Calculation (min)  45 min    Activity Tolerance  Other (comment);Patient tolerated treatment well;No increased pain   Treatment limited secondary to vertigo   Behavior During Therapy  [facility] for tasks assessed/performed       Past Medical History:  Diagnosis Date  . Diabetes mellitus without complication (HCC)   . Hypertension   . Sleep apnea   . Vertigo     Past Surgical History:  Procedure Laterality Date  . ABDOMINAL HYSTERECTOMY    . BREAST BIOPSY Left 10/2017    APOCRINE TYPE CYST WITH FIBROSIS Of the Wall  . CORONARY STENT INTERVENTION N/A 02/24/2018   Procedure: CORONARY STENT INTERVENTION;  Surgeon: Marcina MillardParaschos, Alexander, MD;  Location: [facility] INVASIVE CV LAB;  Service: Cardiovascular;  Laterality: N/A;  . LEFT HEART CATH AND CORONARY ANGIOGRAPHY Right 02/24/2018   Procedure: LEFT HEART CATH AND CORONARY ANGIOGRAPHY;  Surgeon: Marcina MillardParaschos, Alexander, MD;  Location: [facility] INVASIVE CV LAB;  Service: Cardiovascular;  Laterality: Right;  . SHOULDER ARTHROSCOPY WITH OPEN ROTATOR CUFF REPAIR Left 08/14/2017   Procedure: SHOULDER ARTHROSCOPY WITH ROTATOR CUFF REPAIR, BICEPS TENOTOMY, SUBACROMIAL DECOMPRESSION;  Surgeon: Lyndle HerrlichBowers, James R, MD;  Location: [facility] ORS;  Service: Orthopedics;  Laterality: Left;  . UMBILICAL HERNIA REPAIR      There were no vitals filed for this visit.  Subjective Assessment - 06/13/19 0904    Subjective  Patient states  she is still experiencing vertigo. Muscle pain much improved and "twinge" in joint but no major pain. Patient is looking forward to resuming cardiac rehab program.    Patient is accompained by:  --   Adult daughter   Pertinent History  Onset [name] 2020 during treadmill training as part of cardiac rehabilitation. Cortisone shots provide relief for "a couple months"; last shot in June, relief until September. Attempting to increase exercise for weight loss and cardiac health but currently able to walk about "one block" before requiring rest due to knee pain. [facility] with [facility]. Pain 5/10, 10/10 worst, 0/10 best, with pain in R hip and LBP with increased activity. Agg: walking, standing. Ease: Voltaren, Aspirin, rest. Does not bother her in non-WB. Comorbidities: CAD, HTN, smoker, occasional vertigo "comes and goes every few months", DM2; L shoulder rotator cuff repair with fair recovery.    Limitations  Walking;Standing    How long can you sit comfortably?  unlimited    How long can you stand comfortably?  5 min    How long can you walk comfortably?  20 min    Diagnostic tests  x-ray (per pt report; the cartilage was "gone" on B knees)    Patient Stated Goals  walk with less pain to lose weight    Currently in Pain?  No/denies    Pain Onset  More than a month ago    Multiple Pain  Sites  No        TREATMENT  TE Ball rolls with foot knee flexion/extension x 30 with cues for full excursion, low tempo, and stability BLE seated adductor squeezes with 5 sec hold x 20 BLE seated abduction against blue theraband resistance x 20 cues for foot positioning for increased excursion and improved target muscle activation RLE LAQ 3 x 10 Seated marches 2 x 10 R/L Standing marches with BUE support for balance x 20 cues for reduced excursion to keep exercise in pain-free range to avoid tissue irritation STS x 5 cues for positioning of feet and anterior lean for improved energy  conservation and biomechanical efficiency  TE for RLE strengthening for reduced load and improved stability over knee  Discussed options for walking program. Patient states anxiety regarding new neighborhood and recent spike in COVID-19 cases with no place nearby that she is comfortable walking. Will continue to explore options next session.   MT Soft tissue mobilization and myofascial release to R quad, adductors, glute med focus over vastus lateralis Seated direct distraction R knee x 40  MT to promote recovery and reduce pain   PT Education - 06/13/19 0947    Education Details  form/technique with exercise; walking program options    Person(s) Educated  Patient    Methods  Explanation;Demonstration;Verbal cues    Comprehension  Verbalized understanding;Returned demonstration;Verbal cues required       PT Short Term Goals - 05/19/19 1723      PT SHORT TERM GOAL #1   Title  Patient will be independent with HEP as adjunct to clinical therapy and to reduce number of visits.    Baseline  HEP given    Time  2    Period  Weeks    Status  Achieved    Target Date  05/18/19      PT SHORT TERM GOAL #2   Title  Patient will demonstrate ability to perform correct gait pattern with SPC 100% of the time for improved energy conservation and functional capacity with ambulation and ADLs.    Baseline  Correct gait pattern 50% of the time    Time  2    Period  Weeks    Status  Achieved    Target Date  05/18/19      PT SHORT TERM GOAL #3   Title  Patient will report 50% or greater improvement in her symptoms of dizziness and imbalance with provoking motions/positions    Baseline  Vertigo 8/10    Time  2    Period  Weeks    Status  On-going    Target Date  05/30/19        PT Long Term Goals - 05/19/19 1352      PT LONG TERM GOAL #1   Title  Patient will improve 10MW speed to 0.8 m/sec to achieve community ambulator status and reduce fall risks.    Baseline  0.69 m/sec with SPC;  11/19 0.74    Time  8    Period  Weeks    Status  Deferred      PT LONG TERM GOAL #2   Title  Patient will improve LEFS score by 9 points to achieve MCID for reduced disability and improved function.    Baseline  LEFS 25; 11/19 could not assess    Time  8    Period  Weeks    Status  Deferred      PT LONG TERM GOAL #3  Title  Patient will reduce worst pain to 6/10 to demonstrate reduced disability with ADLs and improved functional capacity.    Baseline  10/10 worst pain; 11/19 0/10   pain 0/10 following cortisol injection   Time  8    Period  Weeks    Status  Achieved      PT LONG TERM GOAL #4   Title  Patient will report walking tolerance of 45 minutes for for return to cardiac walking program.    Baseline  walking tolerance 20 min; 11/19 20 min secondary to vertigo    Time  8    Period  Weeks    Status  Deferred      PT LONG TERM GOAL #5   Title  Patient will report no vertigo with provoking motions or positions in order to be able to perform ADLs and mobility tasks without symptoms.    Baseline  8/10 dizziness with provoking motions; 11/19 8/10    Time  4    Period  Weeks    Status  On-going            Plan - 06/13/19 0948    Clinical Impression Statement  Patient presents today without pain evidencing good recovery from prior session. Therapy today focused on light RLE OKC exercise with introduction of functional CKC patterns. Patient reported mild pain in L hip/low back at end of session consistent with observed lumbar compensation for lateral hip weakness. Patient is improving in terms of pain and activity tolerance and will benefit from additional skilled therapy to strengthen RLE and reduce pain with functional activity.    Personal Factors and Comorbidities  Age;Comorbidity 3+;Fitness    Comorbidities  CAD, vertigo, HTN, L shoulder pain/weakness    Examination-Activity Limitations  Bathing;Locomotion Level;Bend;Squat;Stairs;Stand;Dressing     Examination-Participation Restrictions  Cleaning;Community Activity;Other   Exercise   Stability/Clinical Decision Making  Evolving/Moderate complexity    Rehab Potential  Good    PT Frequency  2x / week    PT Duration  8 weeks    PT Treatment/Interventions  ADLs/Self Care Home Management;Aquatic Therapy;Cryotherapy;Electrical Stimulation;Moist Heat;DME Instruction;Gait training;Stair training;Functional mobility training;Therapeutic activities;Therapeutic exercise;Balance training;Neuromuscular re-education;Patient/family education;Manual techniques;Passive range of motion;Dry needling;Energy conservation;Taping;Joint Manipulations;Canalith Repostioning    PT Next Visit Plan  Progress exercises    PT Home Exercise Plan  Heel slides, SAQ, adductor isometric, seated hip abd against green TB, prayer stretch in seated    Consulted and Agree with Plan of Care  Patient       Patient will benefit from skilled therapeutic intervention in order to improve the following deficits and impairments:  Abnormal gait, Decreased coordination, Decreased range of motion, Difficulty walking, Decreased endurance, Obesity, Decreased activity tolerance, Pain, Decreased balance, Hypomobility, Improper body mechanics, Impaired flexibility, Decreased mobility, Decreased strength, Increased edema, Postural dysfunction  Visit Diagnosis: Primary osteoarthritis of right knee  Other abnormalities of gait and mobility  Pain in right hip     Problem List Patient Active Problem List   Diagnosis Date Noted  . Thrombocytopenia (HCC) 04/08/2019  . HTN (hypertension) 07/05/2018  . Tobacco use 07/05/2018  . S/P drug eluting coronary stent placement 03/15/2018  . Unstable angina (HCC) 02/24/2018  . NSTEMI (non-ST elevated myocardial infarction) (HCC) 02/24/2018  . Full thickness rotator cuff tear 07/31/2017    Janee Morn, SPT 06/13/2019, 10:08 AM  [facility] PHYSICAL AND SPORTS  MEDICINE [address], [place], [address] Phone: [phone]   Fax:  [phone]  Name:  Karen Potts MRN: [id number] Date of Birth: [birth year]

## 2019-06-14 ENCOUNTER — Ambulatory Visit: Payer: Medicaid Other | Admitting: Physical Therapy

## 2019-06-14 ENCOUNTER — Encounter: Payer: Self-pay | Admitting: Physical Therapy

## 2019-06-14 DIAGNOSIS — R42 Dizziness and giddiness: Secondary | ICD-10-CM

## 2019-06-14 DIAGNOSIS — M1711 Unilateral primary osteoarthritis, right knee: Secondary | ICD-10-CM | POA: Diagnosis not present

## 2019-06-14 NOTE — Therapy (Addendum)
[place] MAIN [facility] SERVICES [address] [place], [place], [address] Phone: [phone]   Fax:  [phone]  Physical Therapy Treatment  Patient Details  Name: Karen Potts MRN: [id number] Date of Birth: 1960/12/30 Referring Provider (PT): Gennette Pac MD   Encounter Date: 06/14/2019  PT End of Session - 06/14/19 0909    Visit Number  9    Number of Visits  17    Date for PT Re-Evaluation  06/29/19    Authorization Type  9/10    PT Start Time  0904    PT Stop Time  0946    PT Time Calculation (min)  42 min    Activity Tolerance  Other (comment);Patient tolerated treatment well;No increased pain   Treatment limited secondary to vertigo   Behavior During Therapy  [facility] for tasks assessed/performed       Past Medical History:  Diagnosis Date  . Diabetes mellitus without complication ([place])   . Hypertension   . Sleep apnea   . Vertigo     Past Surgical History:  Procedure Laterality Date  . ABDOMINAL HYSTERECTOMY    . BREAST BIOPSY Left 10/2017    APOCRINE TYPE CYST WITH FIBROSIS Of the Wall  . CORONARY STENT INTERVENTION N/A 02/24/2018   Procedure: CORONARY STENT INTERVENTION;  Surgeon: Isaias Cowman, MD;  Location: [place] [facility];  Service: Cardiovascular;  Laterality: N/A;  . LEFT HEART CATH AND CORONARY ANGIOGRAPHY Right 02/24/2018   Procedure: LEFT HEART CATH AND CORONARY ANGIOGRAPHY;  Surgeon: Isaias Cowman, MD;  Location: [place] [facility];  Service: Cardiovascular;  Laterality: Right;  . SHOULDER ARTHROSCOPY WITH OPEN ROTATOR CUFF REPAIR Left 08/14/2017   Procedure: SHOULDER ARTHROSCOPY WITH ROTATOR CUFF REPAIR, BICEPS TENOTOMY, SUBACROMIAL DECOMPRESSION;  Surgeon: Lovell Sheehan, MD;  Location: [facility];  Service: Orthopedics;  Laterality: Left;  . UMBILICAL HERNIA REPAIR      There were no vitals filed for this visit.  Subjective Assessment - 06/14/19 0907    Subjective  Patient states her  heart doctor took her off the blood thinner.  Patient reports her dizziness is about the same. Patient states when she turns to the right she gets "whooziness" and states she sleeps on her left side.    Patient is accompained by:  --   Adult daughter   Pertinent History  Onset [name] 2020 during treadmill training as part of cardiac rehabilitation. Cortisone shots provide relief for "a couple months"; last shot in June, relief until September. Attempting to increase exercise for weight loss and cardiac health but currently able to walk about "one block" before requiring rest due to knee pain. Recently purchased [facility] for assistance with ambulation. Pain 5/10, 10/10 worst, 0/10 best, with pain in R hip and LBP with increased activity. Agg: walking, standing. Ease: Voltaren, Aspirin, rest. Does not bother her in non-WB. Comorbidities: CAD, HTN, smoker, occasional vertigo "comes and goes every few months", DM2; L shoulder rotator cuff repair with fair recovery.    Limitations  Walking;Standing    How long can you sit comfortably?  unlimited    How long can you stand comfortably?  5 min    How long can you walk comfortably?  20 min    Diagnostic tests  x-ray (per pt report; the cartilage was "gone" on B knees)    Patient Stated Goals  walk with less pain to lose weight    Pain Onset  More than a month ago  Canalith Repositioning Manuever: On mat table, performed right Dix-Hallpike testing and it was positive with right upbeating torsional nystagmus of short duration without latency. Performed right canalith repositioning maneuver (CRT). Repeated right CRT for a total of 2 maneuvers with retesting between maneuvers. Performed 2 minute holds in each position to try to promote clearance.  Patient reported 10/10 vertigo with first test and 5/10 with second. Patient reported mild nausea during second maneuver therefore deferred a third trial. Note: patient noted to have left upbeating torsional nystagmus  when she moved from right rotation to left cervical rotation during Epley maneuver, which could be indicative of left BPPV as well as the right during both maneuvers.     PT Education - 06/14/19 0909    Education Details  discussed BPPV and plan of care    Person(s) Educated  Patient    Methods  Explanation    Comprehension  Verbalized understanding       PT Short Term Goals - 05/19/19 1723      PT SHORT TERM GOAL #1   Title  Patient will be independent with HEP as adjunct to clinical therapy and to reduce number of visits.    Baseline  HEP given    Time  2    Period  Weeks    Status  Achieved    Target Date  05/18/19      PT SHORT TERM GOAL #2   Title  Patient will demonstrate ability to perform correct gait pattern with SPC 100% of the time for improved energy conservation and functional capacity with ambulation and ADLs.    Baseline  Correct gait pattern 50% of the time    Time  2    Period  Weeks    Status  Achieved    Target Date  05/18/19      PT SHORT TERM GOAL #3   Title  Patient will report 50% or greater improvement in her symptoms of dizziness and imbalance with provoking motions/positions    Baseline  Vertigo 8/10    Time  2    Period  Weeks    Status  On-going    Target Date  05/30/19        PT Long Term Goals - 05/19/19 1352      PT LONG TERM GOAL #1   Title  Patient will improve 10MW speed to 0.8 m/sec to achieve community ambulator status and reduce fall risks.    Baseline  0.69 m/sec with SPC; 11/19 0.74    Time  8    Period  Weeks    Status  Deferred      PT LONG TERM GOAL #2   Title  Patient will improve LEFS score by 9 points to achieve MCID for reduced disability and improved function.    Baseline  LEFS 25; 11/19 could not assess    Time  8    Period  Weeks    Status  Deferred      PT LONG TERM GOAL #3   Title  Patient will reduce worst pain to 6/10 to demonstrate reduced disability with ADLs and improved functional capacity.     Baseline  10/10 worst pain; 11/19 0/10   pain 0/10 following cortisol injection   Time  8    Period  Weeks    Status  Achieved      PT LONG TERM GOAL #4   Title  Patient will report walking tolerance of 45 minutes for for return to  cardiac walking program.    Baseline  walking tolerance 20 min; 11/19 20 min secondary to vertigo    Time  8    Period  Weeks    Status  Deferred      PT LONG TERM GOAL #5   Title  Patient will report no vertigo with provoking motions or positions in order to be able to perform ADLs and mobility tasks without symptoms.    Baseline  8/10 dizziness with provoking motions; 11/19 8/10    Time  4    Period  Weeks    Status  On-going            Plan - 06/14/19 0939    Clinical Impression Statement  Patient returns to the clinic with reports of continued vertigo. Patient with positive right BPPV and performed 2 canalith repositioning maneuvers secondary to patient became mildly nauseous during second maneuver. Patient noted to have reverse nystagmus when turning head from right cervical rotation to left rotation during maneuver which could be indicative of BPPV on the left side as well. Will continue to treat right side until full clearance is achieved as this is the most symptomatic side for the patient. Continue per plan of care.    Personal Factors and Comorbidities  Age;Comorbidity 3+;Fitness    Comorbidities  CAD, vertigo, HTN, L shoulder pain/weakness    Examination-Activity Limitations  Bathing;Locomotion Level;Bend;Squat;Stairs;Stand;Dressing    Examination-Participation Restrictions  Cleaning;Community Activity;Other   Exercise   Stability/Clinical Decision Making  Evolving/Moderate complexity    Rehab Potential  Good    PT Frequency  2x / week    PT Duration  8 weeks    PT Treatment/Interventions  ADLs/Self Care Home Management;Aquatic Therapy;Cryotherapy;Electrical Stimulation;Moist Heat;DME Instruction;Gait training;Stair training;Functional  mobility training;Therapeutic activities;Therapeutic exercise;Balance training;Neuromuscular re-education;Patient/family education;Manual techniques;Passive range of motion;Dry needling;Energy conservation;Taping;Joint Manipulations;Canalith Repostioning    PT Next Visit Plan  Progress exercises    PT Home Exercise Plan  Heel slides, SAQ, adductor isometric, seated hip abd against green TB, prayer stretch in seated    Consulted and Agree with Plan of Care  Patient       Patient will benefit from skilled therapeutic intervention in order to improve the following deficits and impairments:  Abnormal gait, Decreased coordination, Decreased range of motion, Difficulty walking, Decreased endurance, Obesity, Decreased activity tolerance, Pain, Decreased balance, Hypomobility, Improper body mechanics, Impaired flexibility, Decreased mobility, Decreased strength, Increased edema, Postural dysfunction  Visit Diagnosis: Dizziness and giddiness     Problem List Patient Active Problem List   Diagnosis Date Noted  . Thrombocytopenia (HCC) 04/08/2019  . HTN (hypertension) 07/05/2018  . Tobacco use 07/05/2018  . S/P drug eluting coronary stent placement 03/15/2018  . Unstable angina (HCC) 02/24/2018  . NSTEMI (non-ST elevated myocardial infarction) (HCC) 02/24/2018  . Full thickness rotator cuff tear 07/31/2017   Mardelle Matte PT, DPT [id number] Mardelle Matte 06/14/2019, 9:50 AM  [place] [facility] MAIN [facility] SERVICES [address] [place], [place], [address] Phone: [phone]   Fax:  [phone]  Name: Karen Potts MRN: [id number] Date of Birth: 04-09-1961

## 2019-06-20 ENCOUNTER — Ambulatory Visit: Payer: Medicaid Other

## 2019-06-20 ENCOUNTER — Other Ambulatory Visit: Payer: Self-pay

## 2019-06-20 DIAGNOSIS — M1711 Unilateral primary osteoarthritis, right knee: Secondary | ICD-10-CM | POA: Diagnosis not present

## 2019-06-20 DIAGNOSIS — R2689 Other abnormalities of gait and mobility: Secondary | ICD-10-CM

## 2019-06-20 NOTE — Therapy (Addendum)
[place] [facility] [address], [place], [address] Phone: [phone]   Fax:  [phone]  Physical Therapy Treatment/Progress Note  Patient Details  Name: Karen Potts MRN: [id number] Date of Birth: 02-08-1961 Referring Provider (PT): Gennette Pac MD  Reporting period: 06/20/2019 Encounter Date: 06/20/2019  PT End of Session - 06/20/19 0904    Visit Number  10    Number of Visits  17    Date for PT Re-Evaluation  06/29/19    Authorization Type  10/10    PT Start Time  0900    PT Stop Time  0945    PT Time Calculation (min)  45 min    Activity Tolerance  Other (comment);Patient tolerated treatment well;No increased pain   Treatment limited secondary to vertigo   Behavior During Therapy  [facility] for tasks assessed/performed       Past Medical History:  Diagnosis Date  . Diabetes mellitus without complication ([place])   . Hypertension   . Sleep apnea   . Vertigo     Past Surgical History:  Procedure Laterality Date  . ABDOMINAL HYSTERECTOMY    . BREAST BIOPSY Left 10/2017    APOCRINE TYPE CYST WITH FIBROSIS Of the Wall  . CORONARY STENT INTERVENTION N/A 02/24/2018   Procedure: CORONARY STENT INTERVENTION;  Surgeon: Isaias Cowman, MD;  Location: [place] [facility];  Service: Cardiovascular;  Laterality: N/A;  . LEFT HEART CATH AND CORONARY ANGIOGRAPHY Right 02/24/2018   Procedure: LEFT HEART CATH AND CORONARY ANGIOGRAPHY;  Surgeon: Isaias Cowman, MD;  Location: [place] [facility];  Service: Cardiovascular;  Laterality: Right;  . SHOULDER ARTHROSCOPY WITH OPEN ROTATOR CUFF REPAIR Left 08/14/2017   Procedure: SHOULDER ARTHROSCOPY WITH ROTATOR CUFF REPAIR, BICEPS TENOTOMY, SUBACROMIAL DECOMPRESSION;  Surgeon: Lovell Sheehan, MD;  Location: [facility];  Service: Orthopedics;  Laterality: Left;  . UMBILICAL HERNIA REPAIR      There were no vitals filed for this visit.  Subjective Assessment -  06/20/19 0901    Subjective  Patient reports walking over weekend multiple days. Knee started hurting on Saturday walking when doing shopping after walking 20 min.    Patient is accompained by:  --   Adult daughter   Pertinent History  Onset [name] 2020 during treadmill training as part of cardiac rehabilitation. Cortisone shots provide relief for "a couple months"; last shot in June, relief until September. Attempting to increase exercise for weight loss and cardiac health but currently able to walk about "one block" before requiring rest due to knee pain. Recently purchased [facility] for assistance with ambulation. Pain 5/10, 10/10 worst, 0/10 best, with pain in R hip and LBP with increased activity. Agg: walking, standing. Ease: Voltaren, Aspirin, rest. Does not bother her in non-WB. Comorbidities: CAD, HTN, smoker, occasional vertigo "comes and goes every few months", DM2; L shoulder rotator cuff repair with fair recovery.    Limitations  Walking;Standing    How long can you sit comfortably?  unlimited    How long can you stand comfortably?  5 min    How long can you walk comfortably?  20 min    Diagnostic tests  x-ray (per pt report; the cartilage was "gone" on B knees)    Patient Stated Goals  walk with less pain to lose weight    Currently in Pain?  No/denies    Pain Onset  More than a month ago       TREATMENT TE [employer]  with foot knee flexion/extension x 30 with cues for full excursion, low tempo, and stability BLE seated abduction against blue theraband resistance x 20 cues for foot positioning for increased excursion and improved target muscle activation BLE LAQ with end-range hip flexion 3 x 10 R 1 x 10 L Seated marches x 20 R/L STS 2 x 5 cues for positioning of feet and gluteal activation- for improved energy conservation and biomechanical efficiency Amb 100 ft - gait analysis reveals RLE ER at tibia. Correction for feet parallel increases knee pain Alternating standing hip ext with  BUE support x 10 R/L  TE for LE strengthening and hip stabilization  MT Soft tissue mobilization and myofascial release to R quad, focus over vastus lateralis Seated direct distraction R knee x 40 bias onto medial and lateral epicondyles P-A tibiofemoral mobilization grade 2 x 30 for improved flow of synovial fluid, reduced pain with knee extension  MT to promote recovery and reduce pain                        PT Education - 06/20/19 0904    Education Details  form/technique with exercise    Person(s) Educated  Patient    Methods  Explanation;Demonstration;Tactile cues;Verbal cues    Comprehension  Verbalized understanding;Returned demonstration;Verbal cues required;Tactile cues required       PT Short Term Goals - 05/19/19 1723      PT SHORT TERM GOAL #1   Title  Patient will be independent with HEP as adjunct to clinical therapy and to reduce number of visits.    Baseline  HEP given    Time  2    Period  Weeks    Status  Achieved    Target Date  05/18/19      PT SHORT TERM GOAL #2   Title  Patient will demonstrate ability to perform correct gait pattern with SPC 100% of the time for improved energy conservation and functional capacity with ambulation and ADLs.    Baseline  Correct gait pattern 50% of the time    Time  2    Period  Weeks    Status  Achieved    Target Date  05/18/19      PT SHORT TERM GOAL #3   Title  Patient will report 50% or greater improvement in her symptoms of dizziness and imbalance with provoking motions/positions    Baseline  Vertigo 8/10    Time  2    Period  Weeks    Status  On-going    Target Date  05/30/19        PT Long Term Goals - 06/20/19 0910      PT LONG TERM GOAL #1   Title  Patient will improve 10MW speed to 0.8 m/sec to achieve community ambulator status and reduce fall risks.    Baseline  0.69 m/sec with SPC; 11/19 0.74; 12/21 deferred    Time  8    Period  Weeks    Status  Deferred      PT LONG  TERM GOAL #2   Title  Patient will improve LEFS score by 9 points to achieve MCID for reduced disability and improved function.    Baseline  LEFS 25; 11/19 could not assess; 12/21 deferred for time    Time  8    Period  Weeks    Status  Deferred      PT LONG TERM GOAL #3   Title  Patient will reduce worst pain to 6/10 to demonstrate reduced disability with ADLs and improved functional capacity.    Baseline  10/10 worst pain; 11/19 0/10; 12/21 5/10   pain 0/10 following cortisol injection   Time  8    Period  Weeks    Status  Partially Met      PT LONG TERM GOAL #4   Title  Patient will report walking tolerance of 45 minutes for for return to cardiac walking program.    Baseline  walking tolerance 20 min; 11/19 20 min secondary to vertigo; 12/21 20 min;    Time  8    Period  Weeks    Status  On-going      PT LONG TERM GOAL #5   Title  Patient will report no vertigo with provoking motions or positions in order to be able to perform ADLs and mobility tasks without symptoms.    Baseline  8/10 dizziness with provoking motions; 11/19 8/10    Time  4    Period  Weeks    Status  On-going            Plan - 06/20/19 0951    Clinical Impression Statement  Patient presents today without pain despite walking several days over weekend. Mild knee pain provoked with challenging therapeutic exercise, resolved with MT. Patient demonstrates improved movement quality with gait suggesting improved energy efficiency. Note ER on R foot with gait at tibiofemoral joint, possibly antalgic; will examine NV. Walking tolerance not improved per pt report but has returned to baseline since acute exacerbation earlier this month. Patient is making progress towards goals and will continue to benefit from skilled physical therapy to reduce knee pain with walking and transfers.    Personal Factors and Comorbidities  Age;Comorbidity 3+;Fitness    Comorbidities  CAD, vertigo, HTN, L shoulder pain/weakness     Examination-Activity Limitations  Bathing;Locomotion Level;Bend;Squat;Stairs;Stand;Dressing    Examination-Participation Restrictions  Cleaning;Community Activity;Other   Exercise   Stability/Clinical Decision Making  Evolving/Moderate complexity    Rehab Potential  Good    PT Frequency  2x / week    PT Duration  8 weeks    PT Treatment/Interventions  ADLs/Self Care Home Management;Aquatic Therapy;Cryotherapy;Electrical Stimulation;Moist Heat;DME Instruction;Gait training;Stair training;Functional mobility training;Therapeutic activities;Therapeutic exercise;Balance training;Neuromuscular re-education;Patient/family education;Manual techniques;Passive range of motion;Dry needling;Energy conservation;Taping;Joint Manipulations;Canalith Repostioning    PT Next Visit Plan  Progress exercises    PT Home Exercise Plan  Heel slides, SAQ, adductor isometric, seated hip abd against green TB, prayer stretch in seated    Consulted and Agree with Plan of Care  Patient       Patient will benefit from skilled therapeutic intervention in order to improve the following deficits and impairments:  Abnormal gait, Decreased coordination, Decreased range of motion, Difficulty walking, Decreased endurance, Obesity, Decreased activity tolerance, Pain, Decreased balance, Hypomobility, Improper body mechanics, Impaired flexibility, Decreased mobility, Decreased strength, Increased edema, Postural dysfunction  Visit Diagnosis: Primary osteoarthritis of right knee  Other abnormalities of gait and mobility     Problem List Patient Active Problem List   Diagnosis Date Noted  . Thrombocytopenia ([place]) 04/08/2019  . HTN (hypertension) 07/05/2018  . Tobacco use 07/05/2018  . S/P drug eluting coronary stent placement 03/15/2018  . Unstable angina ([place]) 02/24/2018  . NSTEMI (non-ST elevated myocardial infarction) ([place]) 02/24/2018  . Full thickness rotator cuff tear 07/31/2017    Virgia Land, SPT 06/20/2019, 9:57  AM  [place] [facility]  [address] [place], [address] Phone: [phone]   Fax:  [phone]  Name: Karen Potts MRN: [id number] Date of Birth: 05/28/1961

## 2019-06-21 ENCOUNTER — Ambulatory Visit: Payer: Medicaid Other | Admitting: Physical Therapy

## 2019-06-21 ENCOUNTER — Encounter: Payer: Self-pay | Admitting: Physical Therapy

## 2019-06-21 DIAGNOSIS — R42 Dizziness and giddiness: Secondary | ICD-10-CM

## 2019-06-21 DIAGNOSIS — M1711 Unilateral primary osteoarthritis, right knee: Secondary | ICD-10-CM | POA: Diagnosis not present

## 2019-06-21 IMAGING — CR DG SHOULDER 2+V*L*
3 series · 3 of 3 positions shown · non-contrast
Comparison: None.

CLINICAL DATA: Fall outside today. Left shoulder pain and decreased
mobility.

EXAM:
LEFT SHOULDER - 2+ VIEW

[shoulder grashey]
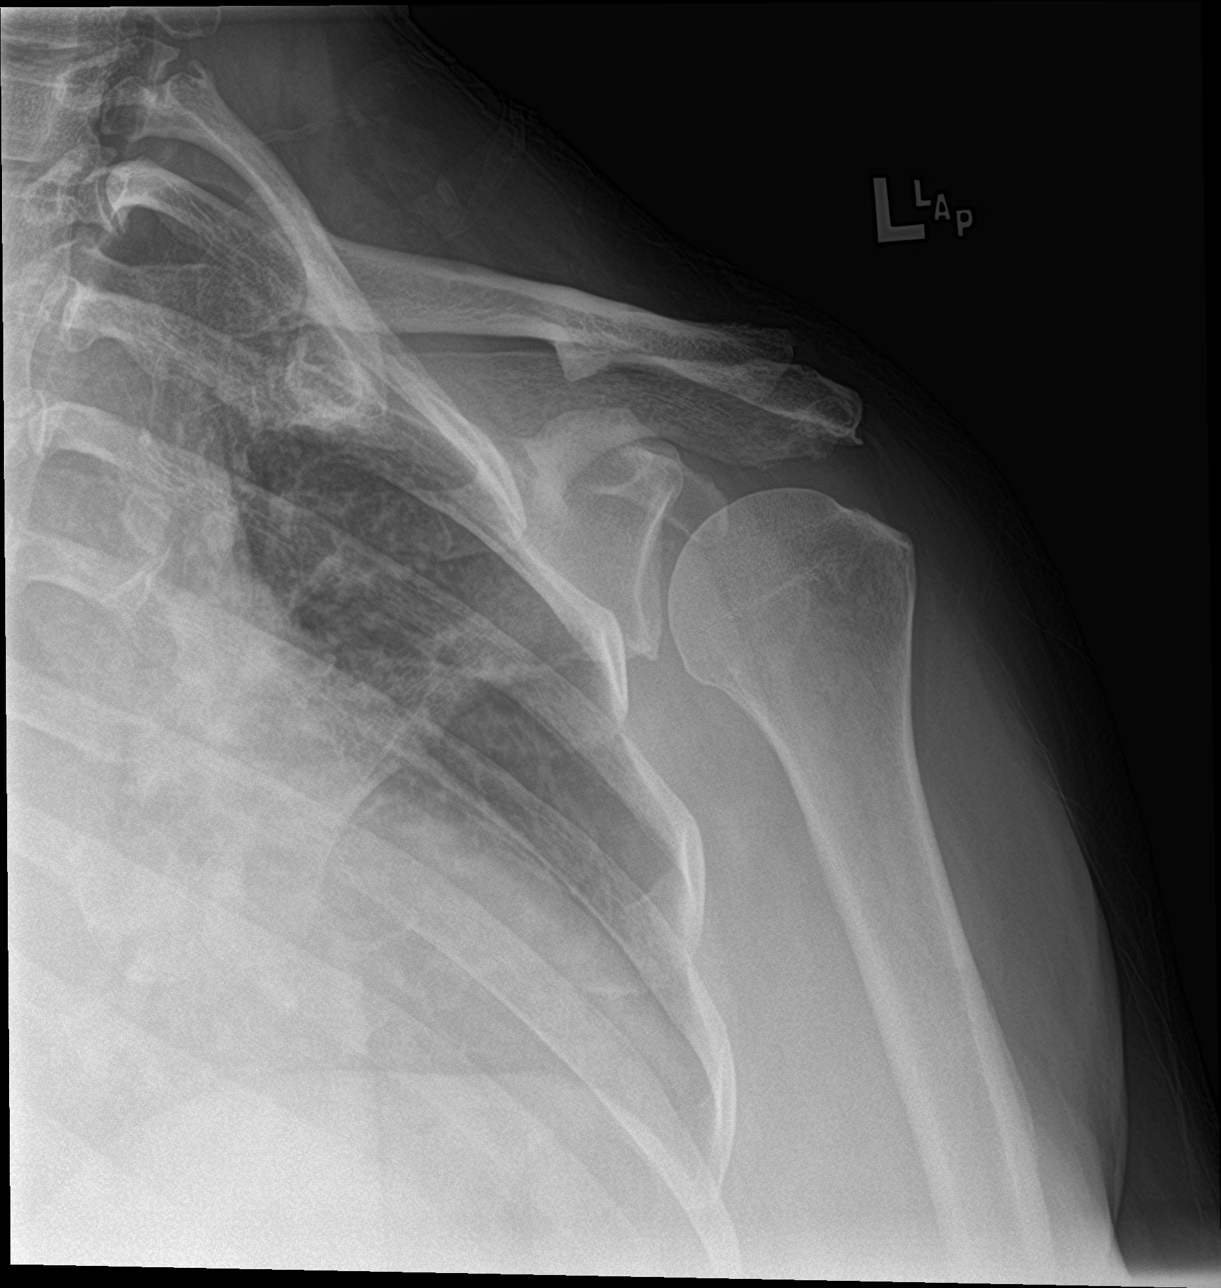

[shoulder y view]
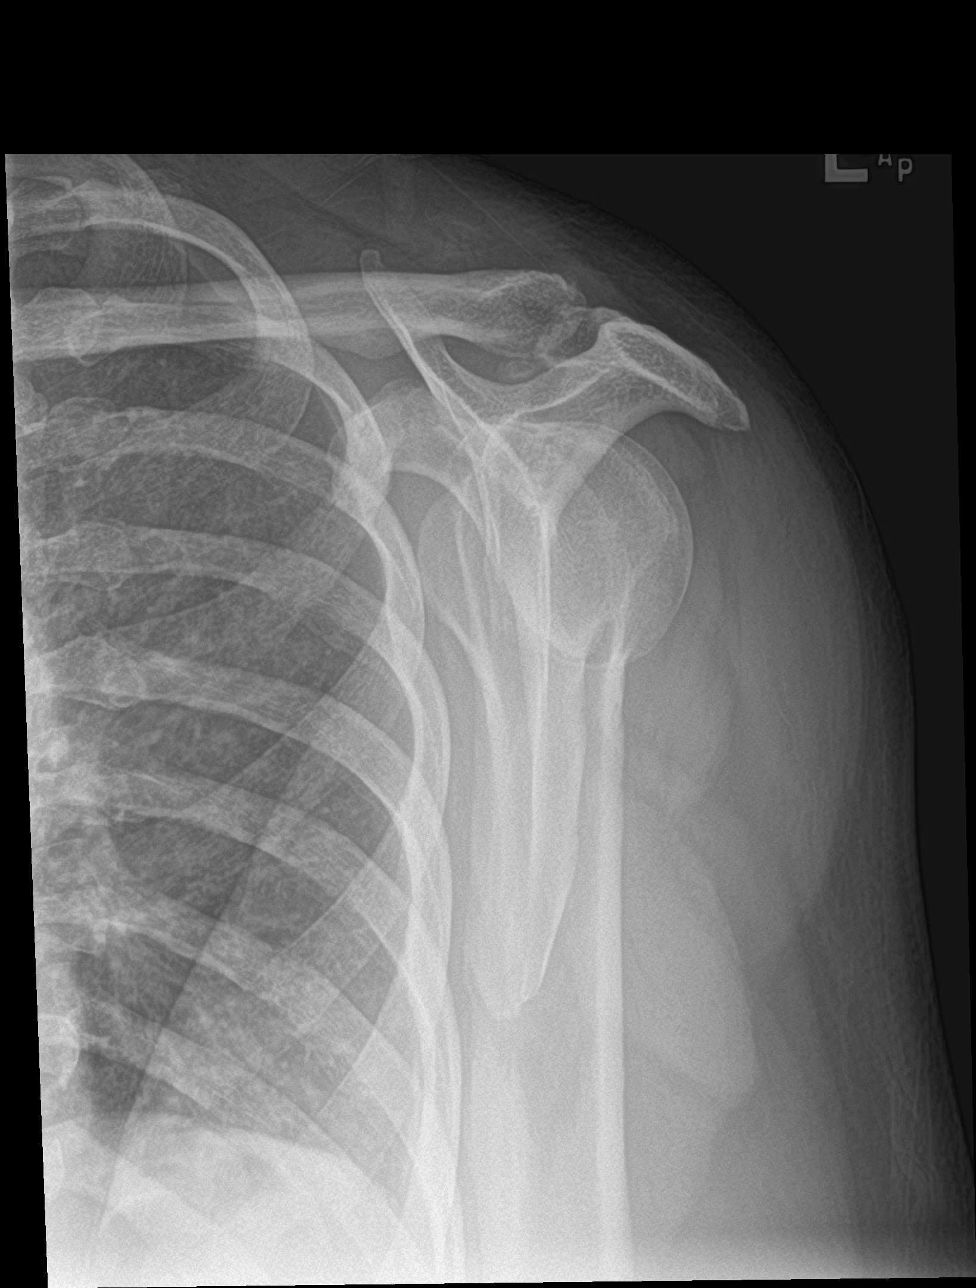

[shoulder axillary]
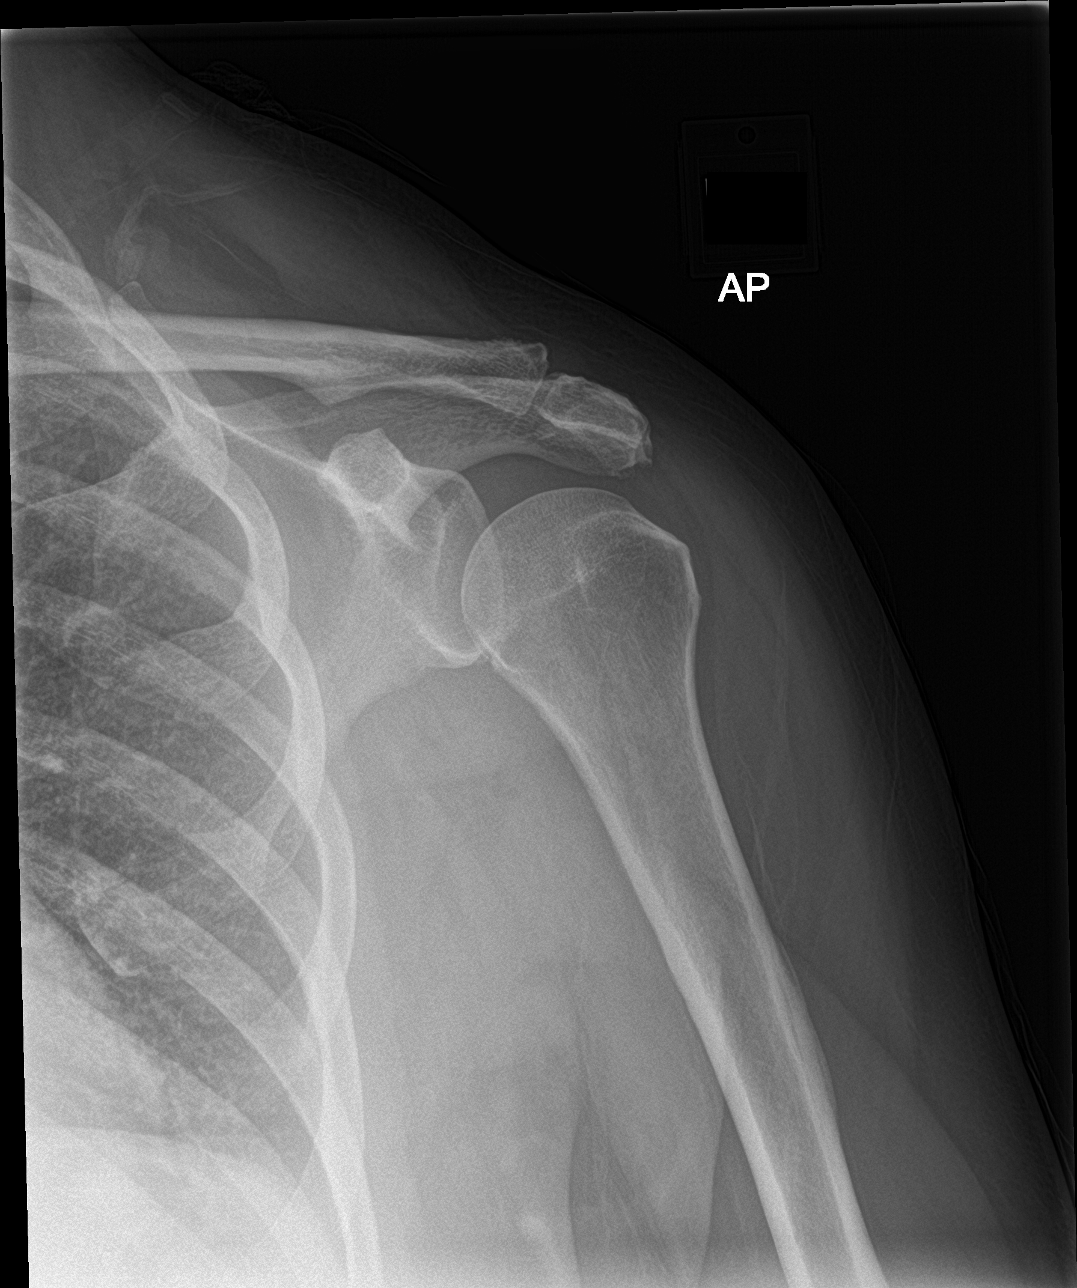

[3 of 3 positions shown; findings below may reference images not displayed]

FINDINGS: There is no evidence of fracture or dislocation. Mild degenerative
spurring is seen involving the distal acromion process. No other
osseous abnormality identified.
IMPRESSION: No acute findings. Mild degenerative spurring of distal acromion
process.

## 2019-06-21 NOTE — Therapy (Signed)
[place] MAIN [facility] SERVICES [address] [place], [place], [address] Phone: [phone]   Fax:  [phone]  Physical Therapy Treatment  Patient Details  Name: Karen Potts MRN: [id number] Date of Birth: [birth year] Referring Provider (PT): Gennette Pac MD   Encounter Date: 06/21/2019  PT End of Session - 06/21/19 0955    Visit Number  11    Number of Visits  17    Date for PT Re-Evaluation  06/29/19    Authorization Type  11/20    PT Start Time  0952    PT Stop Time  1032    PT Time Calculation (min)  40 min    Activity Tolerance  Patient tolerated treatment well   Treatment limited secondary to vertigo   Behavior During Therapy  [facility] for tasks assessed/performed       Past Medical History:  Diagnosis Date  . Diabetes mellitus without complication ([place])   . Hypertension   . Sleep apnea   . Vertigo     Past Surgical History:  Procedure Laterality Date  . ABDOMINAL HYSTERECTOMY    . BREAST BIOPSY Left 10/2017    APOCRINE TYPE CYST WITH FIBROSIS Of the Wall  . CORONARY STENT INTERVENTION N/A 02/24/2018   Procedure: CORONARY STENT INTERVENTION;  Surgeon: Isaias Cowman, MD;  Location: [place] [facility];  Service: Cardiovascular;  Laterality: N/A;  . LEFT HEART CATH AND CORONARY ANGIOGRAPHY Right 02/24/2018   Procedure: LEFT HEART CATH AND CORONARY ANGIOGRAPHY;  Surgeon: Isaias Cowman, MD;  Location: [place] [facility];  Service: Cardiovascular;  Laterality: Right;  . SHOULDER ARTHROSCOPY WITH OPEN ROTATOR CUFF REPAIR Left 08/14/2017   Procedure: SHOULDER ARTHROSCOPY WITH ROTATOR CUFF REPAIR, BICEPS TENOTOMY, SUBACROMIAL DECOMPRESSION;  Surgeon: Lovell Sheehan, MD;  Location: [facility] ORS;  Service: Orthopedics;  Laterality: Left;  . UMBILICAL HERNIA REPAIR      There were no vitals filed for this visit.  Subjective Assessment - 06/21/19 0956    Subjective  Patient states she had an episode of vertigo on  Saturday when she was playing with her grandchildren. Patient states this was the only episode she had this past week. It occurred when she laid back quickly on her bed.    Patient is accompained by:  --   Adult daughter   Pertinent History  Onset [name] 2020 during treadmill training as part of cardiac rehabilitation. Cortisone shots provide relief for "a couple months"; last shot in June, relief until September. Attempting to increase exercise for weight loss and cardiac health but currently able to walk about "one block" before requiring rest due to knee pain. Recently purchased [facility] for assistance with ambulation. Pain 5/10, 10/10 worst, 0/10 best, with pain in R hip and LBP with increased activity. Agg: walking, standing. Ease: Voltaren, Aspirin, rest. Does not bother her in non-WB. Comorbidities: CAD, HTN, smoker, occasional vertigo "comes and goes every few months", DM2; L shoulder rotator cuff repair with fair recovery.    Limitations  Walking;Standing    How long can you sit comfortably?  unlimited    How long can you stand comfortably?  5 min    How long can you walk comfortably?  20 min    Diagnostic tests  x-ray (per pt report; the cartilage was "gone" on B knees)    Patient Stated Goals  walk with less pain to lose weight    Pain Onset  More than a month ago  Patient states she took a Meclizine on Saturday and she felt better after one hour.   Canalith Repositioning Manuever: On mat table, performed right Dix-Hallpike testing and it was positive with right upbeating torsional nystagmus of short duration with about 5-10 second latency.  Performed right canalith repositioning maneuver (CRT) with 2 minute holds in each position.  Repeated right CRT for a total of 3 maneuvers with retesting between maneuvers. Patient reports 8/10 vertigo with first maneuver and 8-9/10 with second.   PT Education - 06/21/19 1321    Education Details  discussed Epley maneuver and plan of care     Person(s) Educated  Patient    Methods  Explanation    Comprehension  Verbalized understanding       PT Short Term Goals - 05/19/19 1723      PT SHORT TERM GOAL #1   Title  Patient will be independent with HEP as adjunct to clinical therapy and to reduce number of visits.    Baseline  HEP given    Time  2    Period  Weeks    Status  Achieved    Target Date  05/18/19      PT SHORT TERM GOAL #2   Title  Patient will demonstrate ability to perform correct gait pattern with SPC 100% of the time for improved energy conservation and functional capacity with ambulation and ADLs.    Baseline  Correct gait pattern 50% of the time    Time  2    Period  Weeks    Status  Achieved    Target Date  05/18/19      PT SHORT TERM GOAL #3   Title  Patient will report 50% or greater improvement in her symptoms of dizziness and imbalance with provoking motions/positions    Baseline  Vertigo 8/10    Time  2    Period  Weeks    Status  On-going    Target Date  05/30/19        PT Long Term Goals - 06/20/19 0910      PT LONG TERM GOAL #1   Title  Patient will improve 10MW speed to 0.8 m/sec to achieve community ambulator status and reduce fall risks.    Baseline  0.69 m/sec with SPC; 11/19 0.74; 12/21 deferred    Time  8    Period  Weeks    Status  Deferred      PT LONG TERM GOAL #2   Title  Patient will improve LEFS score by 9 points to achieve MCID for reduced disability and improved function.    Baseline  LEFS 25; 11/19 could not assess; 12/21 deferred for time    Time  8    Period  Weeks    Status  Deferred      PT LONG TERM GOAL #3   Title  Patient will reduce worst pain to 6/10 to demonstrate reduced disability with ADLs and improved functional capacity.    Baseline  10/10 worst pain; 11/19 0/10; 12/21 5/10   pain 0/10 following cortisol injection   Time  8    Period  Weeks    Status  Partially Met      PT LONG TERM GOAL #4   Title  Patient will report walking tolerance of 45  minutes for for return to cardiac walking program.    Baseline  walking tolerance 20 min; 11/19 20 min secondary to vertigo; 12/21 20 min;    Time  8    Period  Weeks    Status  On-going      PT LONG TERM GOAL #5   Title  Patient will report no vertigo with provoking motions or positions in order to be able to perform ADLs and mobility tasks without symptoms.    Baseline  8/10 dizziness with provoking motions; 11/19 8/10    Time  4    Period  Weeks    Status  On-going            Plan - 06/21/19 1322    Clinical Impression Statement  Patient reports one episode of vertigo since last week when she went from sitting up to supine quickly. Patient with positive right Dix-Hallpike test this date and patient wasa able to tolerate two maneuvers with two minute holds in each Epley position to try to promote clearance of particles. Patient will benefit from further Epley maneuvers per her tolerance until clearance of particles are obtained. Encouraged patient to follow-up as indicated.    Personal Factors and Comorbidities  Age;Comorbidity 3+;Fitness    Comorbidities  CAD, vertigo, HTN, L shoulder pain/weakness    Examination-Activity Limitations  Bathing;Locomotion Level;Bend;Squat;Stairs;Stand;Dressing    Examination-Participation Restrictions  Cleaning;Community Activity;Other   Exercise   Stability/Clinical Decision Making  Evolving/Moderate complexity    Rehab Potential  Good    PT Frequency  2x / week    PT Duration  8 weeks    PT Treatment/Interventions  ADLs/Self Care Home Management;Aquatic Therapy;Cryotherapy;Electrical Stimulation;Moist Heat;DME Instruction;Gait training;Stair training;Functional mobility training;Therapeutic activities;Therapeutic exercise;Balance training;Neuromuscular re-education;Patient/family education;Manual techniques;Passive range of motion;Dry needling;Energy conservation;Taping;Joint Manipulations;Canalith Repostioning    PT Next Visit Plan  Progress  exercises    PT Home Exercise Plan  Heel slides, SAQ, adductor isometric, seated hip abd against green TB, prayer stretch in seated    Consulted and Agree with Plan of Care  Patient       Patient will benefit from skilled therapeutic intervention in order to improve the following deficits and impairments:  Abnormal gait, Decreased coordination, Decreased range of motion, Difficulty walking, Decreased endurance, Obesity, Decreased activity tolerance, Pain, Decreased balance, Hypomobility, Improper body mechanics, Impaired flexibility, Decreased mobility, Decreased strength, Increased edema, Postural dysfunction  Visit Diagnosis: Dizziness and giddiness     Problem List Patient Active Problem List   Diagnosis Date Noted  . Thrombocytopenia ([place]) 04/08/2019  . HTN (hypertension) 07/05/2018  . Tobacco use 07/05/2018  . S/P drug eluting coronary stent placement 03/15/2018  . Unstable angina ([place]) 02/24/2018  . NSTEMI (non-ST elevated myocardial infarction) ([place]) 02/24/2018  . Full thickness rotator cuff tear 07/31/2017   Lady Deutscher PT, DPT [phone] Lady Deutscher 06/21/2019, 1:27 PM  [place] MAIN [facility] SERVICES [address] [place], [place], [address] Phone: [phone]   Fax:  [phone]  Name: Karen Potts MRN: [id number] Date of Birth: 1961/04/26

## 2019-06-28 ENCOUNTER — Other Ambulatory Visit: Payer: Self-pay

## 2019-06-28 ENCOUNTER — Ambulatory Visit: Payer: Medicaid Other | Admitting: Physical Therapy

## 2019-06-28 ENCOUNTER — Encounter: Payer: Self-pay | Admitting: Physical Therapy

## 2019-06-28 DIAGNOSIS — R42 Dizziness and giddiness: Secondary | ICD-10-CM

## 2019-06-28 NOTE — Therapy (Signed)
[place] MAIN [facility] SERVICES [address] [place], [place], [address] Phone: [phone]   Fax:  [phone]  Physical Therapy Treatment- Arrived No Charge visit  Patient Details  Name: Karen Potts MRN: [id number] Date of Birth: 21-[name]-1961 Referring Provider (PT): Gennette Pac MD   Encounter Date: 06/28/2019  PT End of Session - 06/28/19 0904    Visit Number  11    Number of Visits  17    Date for PT Re-Evaluation  06/29/19    Authorization Type  11/20    PT Start Time  0904    PT Stop Time  0920    PT Time Calculation (min)  16 min    Activity Tolerance  --   Treatment limited secondary to vertigo   Behavior During Therapy  --       Past Medical History:  Diagnosis Date  . Diabetes mellitus without complication ([place])   . Hypertension   . Sleep apnea   . Vertigo     Past Surgical History:  Procedure Laterality Date  . ABDOMINAL HYSTERECTOMY    . BREAST BIOPSY Left 10/2017    APOCRINE TYPE CYST WITH FIBROSIS Of the Wall  . CORONARY STENT INTERVENTION N/A 02/24/2018   Procedure: CORONARY STENT INTERVENTION;  Surgeon: Isaias Cowman, MD;  Location: [place] [facility];  Service: Cardiovascular;  Laterality: N/A;  . LEFT HEART CATH AND CORONARY ANGIOGRAPHY Right 02/24/2018   Procedure: LEFT HEART CATH AND CORONARY ANGIOGRAPHY;  Surgeon: Isaias Cowman, MD;  Location: [place] [facility];  Service: Cardiovascular;  Laterality: Right;  . SHOULDER ARTHROSCOPY WITH OPEN ROTATOR CUFF REPAIR Left 08/14/2017   Procedure: SHOULDER ARTHROSCOPY WITH ROTATOR CUFF REPAIR, BICEPS TENOTOMY, SUBACROMIAL DECOMPRESSION;  Surgeon: Lovell Sheehan, MD;  Location: [facility] ORS;  Service: Orthopedics;  Laterality: Left;  . UMBILICAL HERNIA REPAIR      There were no vitals filed for this visit.  Subjective Assessment - 06/28/19 0930    Subjective  Patient reports that she slept on her right side on Saturday night and states she woke  pu on Sunday with vertigo. Patient states she had vertigo, nausea and vomiting on Sunday. Patient states she was dizzy all day and night yesterday and states she took a Meclizine at 8pm yesterday. Patient states she is still "foggy headed", has greater than 5/10 dizziness and is nauseous upon arrival. Discussed with patient and she is in agreement to defer treatment this date as patient will likely not be able to tolerate an Epley maneuver today.    Patient is accompained by:  --   Adult daughter   Pertinent History  Onset [name] 2020 during treadmill training as part of cardiac rehabilitation. Cortisone shots provide relief for "a couple months"; last shot in June, relief until September. Attempting to increase exercise for weight loss and cardiac health but currently able to walk about "one block" before requiring rest due to knee pain. Recently purchased [facility] for assistance with ambulation. Pain 5/10, 10/10 worst, 0/10 best, with pain in R hip and LBP with increased activity. Agg: walking, standing. Ease: Voltaren, Aspirin, rest. Does not bother her in non-WB. Comorbidities: CAD, HTN, smoker, occasional vertigo "comes and goes every few months", DM2; L shoulder rotator cuff repair with fair recovery.    Limitations  Walking;Standing    How long can you sit comfortably?  unlimited    How long can you stand comfortably?  5 min    How long can you walk  comfortably?  20 min    Diagnostic tests  x-ray (per pt report; the cartilage was "gone" on B knees)    Patient Stated Goals  walk with less pain to lose weight    Pain Onset  More than a month ago          PT Education - 06/28/19 0904    Education Details  encouraged patient to rest and stay well hydrated today    Person(s) Educated  Patient    Methods  Explanation    Comprehension  Verbalized understanding       PT Short Term Goals - 05/19/19 1723      PT SHORT TERM GOAL #1   Title  Patient will be independent with HEP as adjunct to clinical  therapy and to reduce number of visits.    Baseline  HEP given    Time  2    Period  Weeks    Status  Achieved    Target Date  05/18/19      PT SHORT TERM GOAL #2   Title  Patient will demonstrate ability to perform correct gait pattern with SPC 100% of the time for improved energy conservation and functional capacity with ambulation and ADLs.    Baseline  Correct gait pattern 50% of the time    Time  2    Period  Weeks    Status  Achieved    Target Date  05/18/19      PT SHORT TERM GOAL #3   Title  Patient will report 50% or greater improvement in her symptoms of dizziness and imbalance with provoking motions/positions    Baseline  Vertigo 8/10    Time  2    Period  Weeks    Status  On-going    Target Date  05/30/19        PT Long Term Goals - 06/20/19 0910      PT LONG TERM GOAL #1   Title  Patient will improve 10MW speed to 0.8 m/sec to achieve community ambulator status and reduce fall risks.    Baseline  0.69 m/sec with SPC; 11/19 0.74; 12/21 deferred    Time  8    Period  Weeks    Status  Deferred      PT LONG TERM GOAL #2   Title  Patient will improve LEFS score by 9 points to achieve MCID for reduced disability and improved function.    Baseline  LEFS 25; 11/19 could not assess; 12/21 deferred for time    Time  8    Period  Weeks    Status  Deferred      PT LONG TERM GOAL #3   Title  Patient will reduce worst pain to 6/10 to demonstrate reduced disability with ADLs and improved functional capacity.    Baseline  10/10 worst pain; 11/19 0/10; 12/21 5/10   pain 0/10 following cortisol injection   Time  8    Period  Weeks    Status  Partially Met      PT LONG TERM GOAL #4   Title  Patient will report walking tolerance of 45 minutes for for return to cardiac walking program.    Baseline  walking tolerance 20 min; 11/19 20 min secondary to vertigo; 12/21 20 min;    Time  8    Period  Weeks    Status  On-going      PT LONG TERM GOAL #5   Title  Patient  will  report no vertigo with provoking motions or positions in order to be able to perform ADLs and mobility tasks without symptoms.    Baseline  8/10 dizziness with provoking motions; 11/19 8/10    Time  4    Period  Weeks    Status  On-going            Plan - 06/28/19 0935    Clinical Impression Statement  Patient arrived to clinic with reports of nausea, dizziness and patient felt she would not be able to tolerate performing a Dix-hallpike or Epley maneuver this date. Therefore rescheduled this appointment for later in the week.    Personal Factors and Comorbidities  Age;Comorbidity 3+;Fitness    Comorbidities  CAD, vertigo, HTN, L shoulder pain/weakness    Examination-Activity Limitations  Bathing;Locomotion Level;Bend;Squat;Stairs;Stand;Dressing    Examination-Participation Restrictions  Cleaning;Community Activity;Other   Exercise   Stability/Clinical Decision Making  Evolving/Moderate complexity    Rehab Potential  Good    PT Frequency  2x / week    PT Duration  8 weeks    PT Treatment/Interventions  ADLs/Self Care Home Management;Aquatic Therapy;Cryotherapy;Electrical Stimulation;Moist Heat;DME Instruction;Gait training;Stair training;Functional mobility training;Therapeutic activities;Therapeutic exercise;Balance training;Neuromuscular re-education;Patient/family education;Manual techniques;Passive range of motion;Dry needling;Energy conservation;Taping;Joint Manipulations;Canalith Repostioning    PT Next Visit Plan  Progress exercises    PT Home Exercise Plan  Heel slides, SAQ, adductor isometric, seated hip abd against green TB, prayer stretch in seated    Consulted and Agree with Plan of Care  Patient       Patient will benefit from skilled therapeutic intervention in order to improve the following deficits and impairments:  Abnormal gait, Decreased coordination, Decreased range of motion, Difficulty walking, Decreased endurance, Obesity, Decreased activity tolerance, Pain,  Decreased balance, Hypomobility, Improper body mechanics, Impaired flexibility, Decreased mobility, Decreased strength, Increased edema, Postural dysfunction  Visit Diagnosis: Dizziness and giddiness     Problem List Patient Active Problem List   Diagnosis Date Noted  . Thrombocytopenia ([place]) 04/08/2019  . HTN (hypertension) 07/05/2018  . Tobacco use 07/05/2018  . S/P drug eluting coronary stent placement 03/15/2018  . Unstable angina ([place]) 02/24/2018  . NSTEMI (non-ST elevated myocardial infarction) ([place]) 02/24/2018  . Full thickness rotator cuff tear 07/31/2017   Lady Deutscher PT, DPT [phone] Lady Deutscher 06/28/2019, 9:37 AM  [place] MAIN [facility] SERVICES [address] [place], [place], [address] Phone: [phone]   Fax:  [phone]  Name: TITA TERHAAR MRN: [id number] Date of Birth: 02/11/1961

## 2019-06-30 ENCOUNTER — Ambulatory Visit: Payer: Medicaid Other | Admitting: Physical Therapy

## 2019-06-30 ENCOUNTER — Other Ambulatory Visit: Payer: Self-pay

## 2019-06-30 ENCOUNTER — Encounter: Payer: Self-pay | Admitting: Physical Therapy

## 2019-06-30 DIAGNOSIS — M1711 Unilateral primary osteoarthritis, right knee: Secondary | ICD-10-CM | POA: Diagnosis not present

## 2019-06-30 DIAGNOSIS — R42 Dizziness and giddiness: Secondary | ICD-10-CM

## 2019-06-30 NOTE — Therapy (Signed)
[facility] [address] [place], [place], [address] Phone: [phone]   Fax:  [phone]  Physical Therapy Treatment  Patient Details  Name: Karen Potts MRN: [id number] Date of Birth: July 04, 1960 Referring Provider (PT): Gennette Pac MD   Encounter Date: 06/30/2019  PT End of Session - 06/30/19 1228    Visit Number  12    Number of Visits  17    Date for PT Re-Evaluation  06/29/19    Authorization Type  12/20    PT Start Time  8115    PT Stop Time  1135    PT Time Calculation (min)  48 min    Activity Tolerance  Patient tolerated treatment well   Treatment limited secondary to vertigo   Behavior During Therapy  [facility] for tasks assessed/performed       Past Medical History:  Diagnosis Date  . Diabetes mellitus without complication ([place])   . Hypertension   . Sleep apnea   . Vertigo     Past Surgical History:  Procedure Laterality Date  . ABDOMINAL HYSTERECTOMY    . BREAST BIOPSY Left 10/2017    APOCRINE TYPE CYST WITH FIBROSIS Of the Wall  . CORONARY STENT INTERVENTION N/A 02/24/2018   Procedure: CORONARY STENT INTERVENTION;  Surgeon: Isaias Cowman, MD;  Location: [place] [facility];  Service: Cardiovascular;  Laterality: N/A;  . LEFT HEART CATH AND CORONARY ANGIOGRAPHY Right 02/24/2018   Procedure: LEFT HEART CATH AND CORONARY ANGIOGRAPHY;  Surgeon: Isaias Cowman, MD;  Location: [place] [facility];  Service: Cardiovascular;  Laterality: Right;  . SHOULDER ARTHROSCOPY WITH OPEN ROTATOR CUFF REPAIR Left 08/14/2017   Procedure: SHOULDER ARTHROSCOPY WITH ROTATOR CUFF REPAIR, BICEPS TENOTOMY, SUBACROMIAL DECOMPRESSION;  Surgeon: Lovell Sheehan, MD;  Location: [facility];  Service: Orthopedics;  Laterality: Left;  . UMBILICAL HERNIA REPAIR      There were no vitals filed for this visit.  Subjective Assessment - 06/30/19 1048    Subjective  Patient states she feels much better than she did a few days  ago when she had a bad vertigo attack. Patient states she was able to sleep on her right side last night and was able to sleep.    Patient is accompained by:  --   Adult daughter   Pertinent History  Onset [name] 2020 during treadmill training as part of cardiac rehabilitation. Cortisone shots provide relief for "a couple months"; last shot in June, relief until September. Attempting to increase exercise for weight loss and cardiac health but currently able to walk about "one block" before requiring rest due to knee pain. Recently purchased [facility] for assistance with ambulation. Pain 5/10, 10/10 worst, 0/10 best, with pain in R hip and LBP with increased activity. Agg: walking, standing. Ease: Voltaren, Aspirin, rest. Does not bother her in non-WB. Comorbidities: CAD, HTN, smoker, occasional vertigo "comes and goes every few months", DM2; L shoulder rotator cuff repair with fair recovery.    Limitations  Walking;Standing    How long can you sit comfortably?  unlimited    How long can you stand comfortably?  5 min    How long can you walk comfortably?  20 min    Diagnostic tests  x-ray (per pt report; the cartilage was "gone" on B knees)    Patient Stated Goals  walk with less pain to lose weight    Pain Onset  More than a month ago       Canalith Repositioning  Manuever: On mat table, performed right Dix-Hallpike testing and it was positive with right upbeating torsional nystagmus of short duration without latency. Performed right canalith repositioning maneuver (CRT) with 2 minute holds in each treatment position. Repeated right CRT for a total of 3 maneuvers with retesting between maneuvers. Patient reported 10/10 with 1st, less than 5/10 with second and third maneuvers. Patient reported no nausea this date during maneuvers and was able to tolerate performing 3 maneuvers for the first time this session.     PT Education - 06/30/19 1048    Education Details  discussed BPPV and Epley    Person(s)  Educated  Patient    Methods  Explanation    Comprehension  Verbalized understanding       PT Short Term Goals - 05/19/19 1723      PT SHORT TERM GOAL #1   Title  Patient will be independent with HEP as adjunct to clinical therapy and to reduce number of visits.    Baseline  HEP given    Time  2    Period  Weeks    Status  Achieved    Target Date  05/18/19      PT SHORT TERM GOAL #2   Title  Patient will demonstrate ability to perform correct gait pattern with SPC 100% of the time for improved energy conservation and functional capacity with ambulation and ADLs.    Baseline  Correct gait pattern 50% of the time    Time  2    Period  Weeks    Status  Achieved    Target Date  05/18/19      PT SHORT TERM GOAL #3   Title  Patient will report 50% or greater improvement in her symptoms of dizziness and imbalance with provoking motions/positions    Baseline  Vertigo 8/10    Time  2    Period  Weeks    Status  On-going    Target Date  05/30/19        PT Long Term Goals - 06/20/19 0910      PT LONG TERM GOAL #1   Title  Patient will improve 10MW speed to 0.8 m/sec to achieve community ambulator status and reduce fall risks.    Baseline  0.69 m/sec with SPC; 11/19 0.74; 12/21 deferred    Time  8    Period  Weeks    Status  Deferred      PT LONG TERM GOAL #2   Title  Patient will improve LEFS score by 9 points to achieve MCID for reduced disability and improved function.    Baseline  LEFS 25; 11/19 could not assess; 12/21 deferred for time    Time  8    Period  Weeks    Status  Deferred      PT LONG TERM GOAL #3   Title  Patient will reduce worst pain to 6/10 to demonstrate reduced disability with ADLs and improved functional capacity.    Baseline  10/10 worst pain; 11/19 0/10; 12/21 5/10   pain 0/10 following cortisol injection   Time  8    Period  Weeks    Status  Partially Met      PT LONG TERM GOAL #4   Title  Patient will report walking tolerance of 45 minutes  for for return to cardiac walking program.    Baseline  walking tolerance 20 min; 11/19 20 min secondary to vertigo; 12/21 20 min;    Time  8    Period  Weeks    Status  On-going      PT LONG TERM GOAL #5   Title  Patient will report no vertigo with provoking motions or positions in order to be able to perform ADLs and mobility tasks without symptoms.    Baseline  8/10 dizziness with provoking motions; 11/19 8/10    Time  4    Period  Weeks    Status  On-going            Plan - 06/30/19 1229    Clinical Impression Statement  Patient reports that she was able to rest yesterday and was able to sleep on her right side. Patient with persistent positive right Dix-Hallpike with nystagmus observed and patient reporting vertigo. Patient was able to tolerate three Epley maneuvers for the first time today and held for 2 minutes in each test position to try to enhance clearance of particles. Will consider teaching patient home Epley maneuver next visit so patient can contnue to perform daily Epley maneuvers until clearance is obtained. Patient would benefit from continued PT services to further address patient's symptoms and to try to decrease her falls risk.    Personal Factors and Comorbidities  Age;Comorbidity 3+;Fitness    Comorbidities  CAD, vertigo, HTN, L shoulder pain/weakness    Examination-Activity Limitations  Bathing;Locomotion Level;Bend;Squat;Stairs;Stand;Dressing    Examination-Participation Restrictions  Cleaning;Community Activity;Other   Exercise   Stability/Clinical Decision Making  Evolving/Moderate complexity    Rehab Potential  Good    PT Frequency  2x / week    PT Duration  8 weeks    PT Treatment/Interventions  ADLs/Self Care Home Management;Aquatic Therapy;Cryotherapy;Electrical Stimulation;Moist Heat;DME Instruction;Gait training;Stair training;Functional mobility training;Therapeutic activities;Therapeutic exercise;Balance training;Neuromuscular  re-education;Patient/family education;Manual techniques;Passive range of motion;Dry needling;Energy conservation;Taping;Joint Manipulations;Canalith Repostioning    PT Next Visit Plan  Progress exercises    PT Home Exercise Plan  Heel slides, SAQ, adductor isometric, seated hip abd against green TB, prayer stretch in seated    Consulted and Agree with Plan of Care  Patient       Patient will benefit from skilled therapeutic intervention in order to improve the following deficits and impairments:  Abnormal gait, Decreased coordination, Decreased range of motion, Difficulty walking, Decreased endurance, Obesity, Decreased activity tolerance, Pain, Decreased balance, Hypomobility, Improper body mechanics, Impaired flexibility, Decreased mobility, Decreased strength, Increased edema, Postural dysfunction  Visit Diagnosis: Dizziness and giddiness     Problem List Patient Active Problem List   Diagnosis Date Noted  . Thrombocytopenia ([place]) 04/08/2019  . HTN (hypertension) 07/05/2018  . Tobacco use 07/05/2018  . S/P drug eluting coronary stent placement 03/15/2018  . Unstable angina ([place]) 02/24/2018  . NSTEMI (non-ST elevated myocardial infarction) ([place]) 02/24/2018  . Full thickness rotator cuff tear 07/31/2017   Lady Deutscher PT, DPT [phone]  Lady Deutscher 06/30/2019, 12:35 PM  [place] [facility] [address] [place], [place], [address] Phone: [phone]   Fax:  [phone]  Name: Karen Potts MRN: [id number] Date of Birth: [birth year]

## 2019-07-05 ENCOUNTER — Other Ambulatory Visit: Payer: Self-pay

## 2019-07-05 ENCOUNTER — Ambulatory Visit: Payer: Medicaid Other | Admitting: Physical Therapy

## 2019-07-07 ENCOUNTER — Ambulatory Visit: Payer: Medicaid Other

## 2019-07-12 ENCOUNTER — Encounter: Payer: Self-pay | Admitting: Physical Therapy

## 2019-07-12 ENCOUNTER — Ambulatory Visit: Payer: Medicaid Other | Attending: Family Medicine | Admitting: Physical Therapy

## 2019-07-12 ENCOUNTER — Other Ambulatory Visit: Payer: Self-pay

## 2019-07-12 DIAGNOSIS — M25551 Pain in right hip: Secondary | ICD-10-CM | POA: Diagnosis present

## 2019-07-12 DIAGNOSIS — M1711 Unilateral primary osteoarthritis, right knee: Secondary | ICD-10-CM | POA: Diagnosis present

## 2019-07-12 DIAGNOSIS — R2689 Other abnormalities of gait and mobility: Secondary | ICD-10-CM | POA: Diagnosis present

## 2019-07-12 DIAGNOSIS — R42 Dizziness and giddiness: Secondary | ICD-10-CM | POA: Diagnosis present

## 2019-07-12 NOTE — Therapy (Signed)
[place] MAIN [facility] SERVICES [address] [place], [place], [address] Phone: [phone]   Fax:  [phone]  Physical Therapy Treatment  Patient Details  Name: Karen Potts MRN: [id number] Date of Birth: Nov 05, 1960 Referring Provider (PT): Gennette Pac MD   Encounter Date: 07/12/2019  PT End of Session - 07/12/19 1034    Visit Number  13    Number of Visits  17    Date for PT Re-Evaluation  06/29/19    Authorization Type  13/20    PT Start Time  1034    Activity Tolerance  Patient tolerated treatment well   Treatment limited secondary to vertigo   Behavior During Therapy  [facility] for tasks assessed/performed       Past Medical History:  Diagnosis Date  . Diabetes mellitus without complication ([place])   . Hypertension   . Sleep apnea   . Vertigo     Past Surgical History:  Procedure Laterality Date  . ABDOMINAL HYSTERECTOMY    . BREAST BIOPSY Left 10/2017    APOCRINE TYPE CYST WITH FIBROSIS Of the Wall  . CORONARY STENT INTERVENTION N/A 02/24/2018   Procedure: CORONARY STENT INTERVENTION;  Surgeon: Isaias Cowman, MD;  Location: [place] [facility];  Service: Cardiovascular;  Laterality: N/A;  . LEFT HEART CATH AND CORONARY ANGIOGRAPHY Right 02/24/2018   Procedure: LEFT HEART CATH AND CORONARY ANGIOGRAPHY;  Surgeon: Isaias Cowman, MD;  Location: [place] [facility];  Service: Cardiovascular;  Laterality: Right;  . SHOULDER ARTHROSCOPY WITH OPEN ROTATOR CUFF REPAIR Left 08/14/2017   Procedure: SHOULDER ARTHROSCOPY WITH ROTATOR CUFF REPAIR, BICEPS TENOTOMY, SUBACROMIAL DECOMPRESSION;  Surgeon: Lovell Sheehan, MD;  Location: [facility];  Service: Orthopedics;  Laterality: Left;  . UMBILICAL HERNIA REPAIR      There were no vitals filed for this visit.  Subjective Assessment - 07/12/19 1033    Subjective  Patient reports that she has continued to have some episodes of veritog but states they are not as intense as they  were before.    Patient is accompained by:  --    Pertinent History  Onset [name] 2020 during treadmill training as part of cardiac rehabilitation. Cortisone shots provide relief for "a couple months"; last shot in June, relief until September. Attempting to increase exercise for weight loss and cardiac health but currently able to walk about "one block" before requiring rest due to knee pain. Recently purchased [facility] for assistance with ambulation. Pain 5/10, 10/10 worst, 0/10 best, with pain in R hip and LBP with increased activity. Agg: walking, standing. Ease: Voltaren, Aspirin, rest. Does not bother her in non-WB. Comorbidities: CAD, HTN, smoker, occasional vertigo "comes and goes every few months", DM2; L shoulder rotator cuff repair with fair recovery.    Limitations  Walking;Standing    How long can you sit comfortably?  unlimited    How long can you stand comfortably?  5 min    How long can you walk comfortably?  20 min    Diagnostic tests  x-ray (per pt report; the cartilage was "gone" on B knees)    Patient Stated Goals  walk with less pain to lose weight    Pain Onset  More than a month ago       Canalith Repositioning Manuever: On mat table, performed right Dix-Hallpike testing and it was positive with right upbeating torsional nystagmus of short duration. Performed right canalith repositioning maneuver (CRT).  Then, discussed self-Epley and provided handout with pictures  and written instructions.  Patient then performed one self-Epley using pillow under shoulder blades with head resting on mat table and noted right upbeating torsional nystagmus in first position of supine with right head rotation with extension. Patient required one verbal cue to not lift her head when turing from right cervical rotation to left rotation and one cue to keep her chin tucked towards shoulder as she came up in to sitting position at the end of the maneuver.  Patient reports "it was less than 5"/10 vertigo with  first and second maneuver.  Patient required rest breaks in between maneuvers secondary to symptoms.   Patient noted to have left upbeating torsional nystagmus after short latency period both times when she moved from right to left cervical rotation position during maneuvers which might indicate positive left BPPV as well. Patient reports that she feels she can perform self-Epley at home on her bed utilizing the handout with instructions.     PT Education - 07/12/19 1033    Education Details  discussed Epley maneuver; educated as to self-Epley and provided for home program    Person(s) Educated  Patient    Methods  Explanation;Handout;Verbal cues    Comprehension  Verbalized understanding       PT Short Term Goals - 05/19/19 1723      PT SHORT TERM GOAL #1   Title  Patient will be independent with HEP as adjunct to clinical therapy and to reduce number of visits.    Baseline  HEP given    Time  2    Period  Weeks    Status  Achieved    Target Date  05/18/19      PT SHORT TERM GOAL #2   Title  Patient will demonstrate ability to perform correct gait pattern with SPC 100% of the time for improved energy conservation and functional capacity with ambulation and ADLs.    Baseline  Correct gait pattern 50% of the time    Time  2    Period  Weeks    Status  Achieved    Target Date  05/18/19      PT SHORT TERM GOAL #3   Title  Patient will report 50% or greater improvement in her symptoms of dizziness and imbalance with provoking motions/positions    Baseline  Vertigo 8/10    Time  2    Period  Weeks    Status  On-going    Target Date  05/30/19        PT Long Term Goals - 06/20/19 0910      PT LONG TERM GOAL #1   Title  Patient will improve 10MW speed to 0.8 m/sec to achieve community ambulator status and reduce fall risks.    Baseline  0.69 m/sec with SPC; 11/19 0.74; 12/21 deferred    Time  8    Period  Weeks    Status  Deferred      PT LONG TERM GOAL #2   Title   Patient will improve LEFS score by 9 points to achieve MCID for reduced disability and improved function.    Baseline  LEFS 25; 11/19 could not assess; 12/21 deferred for time    Time  8    Period  Weeks    Status  Deferred      PT LONG TERM GOAL #3   Title  Patient will reduce worst pain to 6/10 to demonstrate reduced disability with ADLs and improved functional capacity.  Baseline  10/10 worst pain; 11/19 0/10; 12/21 5/10   pain 0/10 following cortisol injection   Time  8    Period  Weeks    Status  Partially Met      PT LONG TERM GOAL #4   Title  Patient will report walking tolerance of 45 minutes for for return to cardiac walking program.    Baseline  walking tolerance 20 min; 11/19 20 min secondary to vertigo; 12/21 20 min;    Time  8    Period  Weeks    Status  On-going      PT LONG TERM GOAL #5   Title  Patient will report no vertigo with provoking motions or positions in order to be able to perform ADLs and mobility tasks without symptoms.    Baseline  8/10 dizziness with provoking motions; 11/19 8/10    Time  4    Period  Weeks    Status  On-going            Plan - 07/12/19 1600    Clinical Impression Statement  Patient reports decreased severity in her dizziness symptoms. Patient better able to tolerate canalith repositioning maneuvers now and therefore, issued self-Epley for home program as patient has had persistent positive right Dix-Hallpike tests. Patient performed self-Epley in clnic this date with verbal cues. Patient to try to perform daily HEP as tolerated and will plan on reviewing next session to assess technique and re-educate if indicated. Patient would benefit from continued PT services to further address goals and functional deficits.    Personal Factors and Comorbidities  Age;Comorbidity 3+;Fitness    Comorbidities  CAD, vertigo, HTN, L shoulder pain/weakness    Examination-Activity Limitations  Bathing;Locomotion  Level;Bend;Squat;Stairs;Stand;Dressing    Examination-Participation Restrictions  Cleaning;Community Activity;Other   Exercise   Stability/Clinical Decision Making  Evolving/Moderate complexity    Rehab Potential  Good    PT Frequency  2x / week    PT Duration  8 weeks    PT Treatment/Interventions  ADLs/Self Care Home Management;Aquatic Therapy;Cryotherapy;Electrical Stimulation;Moist Heat;DME Instruction;Gait training;Stair training;Functional mobility training;Therapeutic activities;Therapeutic exercise;Balance training;Neuromuscular re-education;Patient/family education;Manual techniques;Passive range of motion;Dry needling;Energy conservation;Taping;Joint Manipulations;Canalith Repostioning    PT Next Visit Plan  Progress exercises    PT Home Exercise Plan  Heel slides, SAQ, adductor isometric, seated hip abd against green TB, prayer stretch in seated    Consulted and Agree with Plan of Care  Patient       Patient will benefit from skilled therapeutic intervention in order to improve the following deficits and impairments:  Abnormal gait, Decreased coordination, Decreased range of motion, Difficulty walking, Decreased endurance, Obesity, Decreased activity tolerance, Pain, Decreased balance, Hypomobility, Improper body mechanics, Impaired flexibility, Decreased mobility, Decreased strength, Increased edema, Postural dysfunction  Visit Diagnosis: Dizziness and giddiness     Problem List Patient Active Problem List   Diagnosis Date Noted  . Thrombocytopenia ([place]) 04/08/2019  . HTN (hypertension) 07/05/2018  . Tobacco use 07/05/2018  . S/P drug eluting coronary stent placement 03/15/2018  . Unstable angina ([place]) 02/24/2018  . NSTEMI (non-ST elevated myocardial infarction) ([place]) 02/24/2018  . Full thickness rotator cuff tear 07/31/2017   Lady Deutscher PT, DPT [phone] Lady Deutscher 07/12/2019, 4:19 PM  [place] MAIN [facility] SERVICES [address] [place], [place], [address] Phone: [phone]   Fax:  [phone]  Name: Karen Potts MRN: [id number] Date of Birth: September 28, 1960

## 2019-07-13 NOTE — Addendum Note (Signed)
Addended by: Bethanie Dicker on: 07/13/2019 02:28 PM   Modules accepted: Orders

## 2019-07-14 ENCOUNTER — Other Ambulatory Visit: Payer: Self-pay

## 2019-07-14 ENCOUNTER — Ambulatory Visit: Payer: Medicaid Other

## 2019-07-14 DIAGNOSIS — R2689 Other abnormalities of gait and mobility: Secondary | ICD-10-CM

## 2019-07-14 DIAGNOSIS — M1711 Unilateral primary osteoarthritis, right knee: Secondary | ICD-10-CM

## 2019-07-14 DIAGNOSIS — R42 Dizziness and giddiness: Secondary | ICD-10-CM | POA: Diagnosis not present

## 2019-07-14 DIAGNOSIS — M25551 Pain in right hip: Secondary | ICD-10-CM

## 2019-07-14 NOTE — Therapy (Signed)
[facility] [address], [place], [address] Phone: [phone]   Fax:  [phone]  Physical Therapy Treatment  Patient Details  Name: Karen Potts MRN: [id number] Date of Birth: 03-01-1961 Referring Provider (PT): Gennette Pac MD   Encounter Date: 07/14/2019  PT End of Session - 07/14/19 1631    Visit Number  15    Number of Visits  17    Date for PT Re-Evaluation  07/20/19    Authorization Type  15/20    PT Start Time  1633    PT Stop Time  1715    PT Time Calculation (min)  42 min    Activity Tolerance  Patient tolerated treatment well   Treatment limited secondary to vertigo   Behavior During Therapy  [facility] for tasks assessed/performed       Past Medical History:  Diagnosis Date  . Diabetes mellitus without complication ([place])   . Hypertension   . Sleep apnea   . Vertigo     Past Surgical History:  Procedure Laterality Date  . ABDOMINAL HYSTERECTOMY    . BREAST BIOPSY Left 10/2017    APOCRINE TYPE CYST WITH FIBROSIS Of the Wall  . CORONARY STENT INTERVENTION N/A 02/24/2018   Procedure: CORONARY STENT INTERVENTION;  Surgeon: Isaias Cowman, MD;  Location: [place] [facility];  Service: Cardiovascular;  Laterality: N/A;  . LEFT HEART CATH AND CORONARY ANGIOGRAPHY Right 02/24/2018   Procedure: LEFT HEART CATH AND CORONARY ANGIOGRAPHY;  Surgeon: Isaias Cowman, MD;  Location: [employer] [facility];  Service: Cardiovascular;  Laterality: Right;  . SHOULDER ARTHROSCOPY WITH OPEN ROTATOR CUFF REPAIR Left 08/14/2017   Procedure: SHOULDER ARTHROSCOPY WITH ROTATOR CUFF REPAIR, BICEPS TENOTOMY, SUBACROMIAL DECOMPRESSION;  Surgeon: Lovell Sheehan, MD;  Location: [facility];  Service: Orthopedics;  Laterality: Left;  . UMBILICAL HERNIA REPAIR      There were no vitals filed for this visit.  Subjective Assessment - 07/14/19 1634    Subjective  Patient reports that her knee has been aching.  Has been doing HEP and ball rolls with foot. Reports LBP over last couple days.    Pertinent History  Onset [name] 2020 during treadmill training as part of cardiac rehabilitation. Cortisone shots provide relief for "a couple months"; last shot in June, relief until September. Attempting to increase exercise for weight loss and cardiac health but currently able to walk about "one block" before requiring rest due to knee pain. Recently purchased [facility] for assistance with ambulation. Pain 5/10, 10/10 worst, 0/10 best, with pain in R hip and LBP with increased activity. Agg: walking, standing. Ease: Voltaren, Aspirin, rest. Does not bother her in non-WB. Comorbidities: CAD, HTN, smoker, occasional vertigo "comes and goes every few months", DM2; L shoulder rotator cuff repair with fair recovery.    Limitations  Walking;Standing    How long can you sit comfortably?  unlimited    How long can you stand comfortably?  5 min    How long can you walk comfortably?  20 min    Diagnostic tests  x-ray (per pt report; the cartilage was "gone" on B knees)    Patient Stated Goals  walk with less pain to lose weight    Currently in Pain?  Yes    Pain Score  5     Pain Location  Back    Pain Orientation  Right;Left;Lower    Pain Descriptors / Indicators  Aching    Pain Type  Chronic pain    Pain Onset  More than a month ago          TREATMENT  Ball rolls flexion/extension x 40 Lumbar stretch into flexion over theraball in seated x 10 Standing lumbar extension with BUE support x 10 Standing marches with UE support for balance x 20 R/L   Tandem stance over R/L with 10 sec holds - pt requires UE support over R SLS over L - pt can perform 10 sec with good stability  MT R Knee direct distract x 30 with pt in seated R knee P-A mobilization grade 2 x 20 for pain reduction  MT for pain reduction with end-range extension and WB  GT 2-point gait with SPC x 10 min with focus on sequencing with SPC in LUE. Pt  reports hx of RCR on L shoulder and feels unstable; advised to continue with RUE    PT Education - 07/14/19 1637    Education Details  form/technique with exercise    Person(s) Educated  Patient    Methods  Explanation;Demonstration;Verbal cues    Comprehension  Verbalized understanding;Returned demonstration;Verbal cues required       PT Short Term Goals - 05/19/19 1723      PT SHORT TERM GOAL #1   Title  Patient will be independent with HEP as adjunct to clinical therapy and to reduce number of visits.    Baseline  HEP given    Time  2    Period  Weeks    Status  Achieved    Target Date  05/18/19      PT SHORT TERM GOAL #2   Title  Patient will demonstrate ability to perform correct gait pattern with SPC 100% of the time for improved energy conservation and functional capacity with ambulation and ADLs.    Baseline  Correct gait pattern 50% of the time    Time  2    Period  Weeks    Status  Achieved    Target Date  05/18/19      PT SHORT TERM GOAL #3   Title  Patient will report 50% or greater improvement in her symptoms of dizziness and imbalance with provoking motions/positions    Baseline  Vertigo 8/10    Time  2    Period  Weeks    Status  On-going    Target Date  05/30/19        PT Long Term Goals - 06/20/19 0910      PT LONG TERM GOAL #1   Title  Patient will improve 10MW speed to 0.8 m/sec to achieve community ambulator status and reduce fall risks.    Baseline  0.69 m/sec with SPC; 11/19 0.74; 12/21 deferred    Time  8    Period  Weeks    Status  Deferred      PT LONG TERM GOAL #2   Title  Patient will improve LEFS score by 9 points to achieve MCID for reduced disability and improved function.    Baseline  LEFS 25; 11/19 could not assess; 12/21 deferred for time    Time  8    Period  Weeks    Status  Deferred      PT LONG TERM GOAL #3   Title  Patient will reduce worst pain to 6/10 to demonstrate reduced disability with ADLs and improved functional  capacity.    Baseline  10/10 worst pain; 11/19 0/10; 12/21 5/10   pain 0/10 following cortisol  injection   Time  8    Period  Weeks    Status  Partially Met      PT LONG TERM GOAL #4   Title  Patient will report walking tolerance of 45 minutes for for return to cardiac walking program.    Baseline  walking tolerance 20 min; 11/19 20 min secondary to vertigo; 12/21 20 min;    Time  8    Period  Weeks    Status  On-going      PT LONG TERM GOAL #5   Title  Patient will report no vertigo with provoking motions or positions in order to be able to perform ADLs and mobility tasks without symptoms.    Baseline  8/10 dizziness with provoking motions; 11/19 8/10    Time  4    Period  Weeks    Status  On-going            Plan - 07/14/19 1829    Clinical Impression Statement  Patient c/o LBP likely influenced by hip weakness, improved with light stretches and mobilization. Knee pain worsened with marching therex, likely due to SLS component evidencing poor hip control. LBP may be due to lumbar compensation for poor hip strength. Patient demonstrates good comprehension with GT for [facility] but reports that L shoulder "doesn't feel stable" and is s/p RCR approximately 2 years ago; advised pt to continue with SPC in RUE. Note improvements in quadriceps strength and activity tolerance compared to previous sessions. PT to continue with hip and quad strengthening for reduction of pain with gait. Patient will benefit from skilled physical therapy to reduce knee pain with ambulation and reduce fall risk.    Personal Factors and Comorbidities  Age;Comorbidity 3+;Fitness    Comorbidities  CAD, vertigo, HTN, L shoulder pain/weakness    Examination-Activity Limitations  Bathing;Locomotion Level;Bend;Squat;Stairs;Stand;Dressing    Examination-Participation Restrictions  Cleaning;Community Activity;Other   Exercise   Stability/Clinical Decision Making  Evolving/Moderate complexity    Rehab Potential  Good     PT Frequency  2x / week    PT Duration  8 weeks    PT Treatment/Interventions  ADLs/Self Care Home Management;Aquatic Therapy;Cryotherapy;Electrical Stimulation;Moist Heat;DME Instruction;Gait training;Stair training;Functional mobility training;Therapeutic activities;Therapeutic exercise;Balance training;Neuromuscular re-education;Patient/family education;Manual techniques;Passive range of motion;Dry needling;Energy conservation;Taping;Joint Manipulations;Canalith Repostioning    PT Next Visit Plan  Advance HEP; SLS and hip strengthening    PT Home Exercise Plan  Heel slides, SAQ, adductor isometric, seated hip abd against green TB, prayer stretch in seated    Consulted and Agree with Plan of Care  Patient       Patient will benefit from skilled therapeutic intervention in order to improve the following deficits and impairments:  Abnormal gait, Decreased coordination, Decreased range of motion, Difficulty walking, Decreased endurance, Obesity, Decreased activity tolerance, Pain, Decreased balance, Hypomobility, Improper body mechanics, Impaired flexibility, Decreased mobility, Decreased strength, Increased edema, Postural dysfunction  Visit Diagnosis: Primary osteoarthritis of right knee  Other abnormalities of gait and mobility  Pain in right hip     Problem List Patient Active Problem List   Diagnosis Date Noted  . Thrombocytopenia ([place]) 04/08/2019  . HTN (hypertension) 07/05/2018  . Tobacco use 07/05/2018  . S/P drug eluting coronary stent placement 03/15/2018  . Unstable angina ([place]) 02/24/2018  . NSTEMI (non-ST elevated myocardial infarction) ([place]) 02/24/2018  . Full thickness rotator cuff tear 07/31/2017    Virgia Land, SPT 07/14/2019, 6:36 PM  [place] [address] [place], [place], [address] Phone: [phone]   Fax:  [phone]  Name: Karen Potts MRN: [id number] Date of Birth: February 12, 1961

## 2019-07-19 ENCOUNTER — Ambulatory Visit: Payer: Medicaid Other | Admitting: Physical Therapy

## 2019-07-26 ENCOUNTER — Encounter: Payer: Self-pay | Admitting: Physical Therapy

## 2019-07-26 ENCOUNTER — Ambulatory Visit: Payer: Medicaid Other | Admitting: Physical Therapy

## 2019-07-26 ENCOUNTER — Other Ambulatory Visit: Payer: Self-pay

## 2019-07-26 DIAGNOSIS — R42 Dizziness and giddiness: Secondary | ICD-10-CM

## 2019-07-26 NOTE — Therapy (Signed)
[place] MAIN [facility] SERVICES [address] [place], [place], [address] Phone: [phone]   Fax:  [phone]  Physical Therapy Treatment  Patient Details  Name: Karen Potts MRN: [id number] Date of Birth: 1960/12/10 Referring Provider (PT): Gennette Pac MD   Encounter Date: 07/26/2019  PT End of Session - 07/26/19 1106    Visit Number  16    Number of Visits  17    Date for PT Re-Evaluation  07/20/19    Authorization Type  16/20    PT Start Time  1107    Activity Tolerance  Patient tolerated treatment well   Treatment limited secondary to vertigo   Behavior During Therapy  [facility] for tasks assessed/performed       Past Medical History:  Diagnosis Date  . Diabetes mellitus without complication ([place])   . Hypertension   . Sleep apnea   . Vertigo     Past Surgical History:  Procedure Laterality Date  . ABDOMINAL HYSTERECTOMY    . BREAST BIOPSY Left 10/2017    APOCRINE TYPE CYST WITH FIBROSIS Of the Wall  . CORONARY STENT INTERVENTION N/A 02/24/2018   Procedure: CORONARY STENT INTERVENTION;  Surgeon: Isaias Cowman, MD;  Location: [place] [facility];  Service: Cardiovascular;  Laterality: N/A;  . LEFT HEART CATH AND CORONARY ANGIOGRAPHY Right 02/24/2018   Procedure: LEFT HEART CATH AND CORONARY ANGIOGRAPHY;  Surgeon: Isaias Cowman, MD;  Location: [place] [facility];  Service: Cardiovascular;  Laterality: Right;  . SHOULDER ARTHROSCOPY WITH OPEN ROTATOR CUFF REPAIR Left 08/14/2017   Procedure: SHOULDER ARTHROSCOPY WITH ROTATOR CUFF REPAIR, BICEPS TENOTOMY, SUBACROMIAL DECOMPRESSION;  Surgeon: Lovell Sheehan, MD;  Location: [facility];  Service: Orthopedics;  Laterality: Left;  . UMBILICAL HERNIA REPAIR      There were no vitals filed for this visit.  Subjective Assessment - 07/28/19 1016    Subjective  Patient states that she has had a few mild episodes since last visit.  Patient reports that she did the self-Epley  maneuver twice since last visit. Patient states when she did the maneuver she said it did reproduce her symptoms.    Pertinent History  Onset [name] 2020 during treadmill training as part of cardiac rehabilitation. Cortisone shots provide relief for "a couple months"; last shot in June, relief until September. Attempting to increase exercise for weight loss and cardiac health but currently able to walk about "one block" before requiring rest due to knee pain. Recently purchased [facility] for assistance with ambulation. Pain 5/10, 10/10 worst, 0/10 best, with pain in R hip and LBP with increased activity. Agg: walking, standing. Ease: Voltaren, Aspirin, rest. Does not bother her in non-WB. Comorbidities: CAD, HTN, smoker, occasional vertigo "comes and goes every few months", DM2; L shoulder rotator cuff repair with fair recovery.    Limitations  Walking;Standing    How long can you sit comfortably?  unlimited    How long can you stand comfortably?  5 min    How long can you walk comfortably?  20 min    Diagnostic tests  x-ray (per pt report; the cartilage was "gone" on B knees)    Patient Stated Goals  walk with less pain to lose weight    Pain Onset  More than a month ago       Neuromuscular Re-education:  Dix-Hallpike: Performed left and right Dix-Hallpike tests and both were negative with patient denying vertigo and no nystagmus observed.  Self-Epley Manuever: On mat table, reviewed  and performed right self-Epley maneuver with a pillow under the thoracic region and no nystagmus was observed and patient denied vertigo. Reviewed the self-Epley and performed one full maneuver. Patient reports that she lives by herself and that she is hesitant to try the self-Epley at home should her symptoms return.   Ambulation with head turns:  Patient performed 175' trials of forwards ambulation with horizontal and vertical head turns with SPC with CGA.  Patient denied dizziness with this activity.  PT Education -  07/26/19 1106    Education Details  discussed self-Epley   Person(s) Educated  Patient    Methods  Explanation    Comprehension  Verbalized understanding       PT Short Term Goals - 05/19/19 1723      PT SHORT TERM GOAL #1   Title  Patient will be independent with HEP as adjunct to clinical therapy and to reduce number of visits.    Baseline  HEP given    Time  2    Period  Weeks    Status  Achieved    Target Date  05/18/19      PT SHORT TERM GOAL #2   Title  Patient will demonstrate ability to perform correct gait pattern with SPC 100% of the time for improved energy conservation and functional capacity with ambulation and ADLs.    Baseline  Correct gait pattern 50% of the time    Time  2    Period  Weeks    Status  Achieved    Target Date  05/18/19      PT SHORT TERM GOAL #3   Title  Patient will report 50% or greater improvement in her symptoms of dizziness and imbalance with provoking motions/positions    Baseline  Vertigo 8/10    Time  2    Period  Weeks    Status  On-going    Target Date  05/30/19        PT Long Term Goals - 07/28/19 1027      PT LONG TERM GOAL #1   Title  Patient will improve 10MW speed to 0.8 m/sec to achieve community ambulator status and reduce fall risks.    Baseline  0.69 m/sec with SPC; 11/19 0.74; 12/21 deferred    Time  8    Period  Weeks    Status  Deferred      PT LONG TERM GOAL #2   Title  Patient will improve LEFS score by 9 points to achieve MCID for reduced disability and improved function.    Baseline  LEFS 25; 11/19 could not assess; 12/21 deferred for time    Time  8    Period  Weeks    Status  Deferred      PT LONG TERM GOAL #3   Title  Patient will reduce worst pain to 6/10 to demonstrate reduced disability with ADLs and improved functional capacity.    Baseline  10/10 worst pain; 11/19 0/10; 12/21 5/10   pain 0/10 following cortisol injection   Time  8    Period  Weeks    Status  Partially Met      PT LONG TERM  GOAL #4   Title  Patient will report walking tolerance of 45 minutes for for return to cardiac walking program.    Baseline  walking tolerance 20 min; 11/19 20 min secondary to vertigo; 12/21 20 min;    Time  8    Period  Weeks  Status  On-going      PT LONG TERM GOAL #5   Title  Patient will report no vertigo with provoking motions or positions in order to be able to perform ADLs and mobility tasks without symptoms.    Baseline  8/10 dizziness with provoking motions; 11/19 8/10    Time  4    Period  Weeks    Status  Achieved         Plan - 07/28/19 1015    Clinical Impression Statement  Patient with bilateral negative Dix-Hallpike tests this date. Reviewed self-Epley maneuver. Patient performed ambulation with head turns horizontal and vertical and patient denied dizziness throughout. Patient to return to clinic in two weeks should her dizziness and vertigo symptoms return and otherwise will be discontinued from vestibular therapy with goal of no vertigo with provoking positions met.    Personal Factors and Comorbidities  Age;Comorbidity 3+;Fitness    Comorbidities  CAD, vertigo, HTN, L shoulder pain/weakness    Examination-Activity Limitations  Bathing;Locomotion Level;Bend;Squat;Stairs;Stand;Dressing    Examination-Participation Restrictions  Cleaning;Community Activity;Other   Exercise   Stability/Clinical Decision Making  Evolving/Moderate complexity    Rehab Potential  Good    PT Frequency  2x / week    PT Duration  8 weeks    PT Treatment/Interventions  ADLs/Self Care Home Management;Aquatic Therapy;Cryotherapy;Electrical Stimulation;Moist Heat;DME Instruction;Gait training;Stair training;Functional mobility training;Therapeutic activities;Therapeutic exercise;Balance training;Neuromuscular re-education;Patient/family education;Manual techniques;Passive range of motion;Dry needling;Energy conservation;Taping;Joint Manipulations;Canalith Repostioning    PT Next Visit Plan   Advance HEP; SLS and hip strengthening    PT Home Exercise Plan  Heel slides, SAQ, adductor isometric, seated hip abd against green TB, prayer stretch in seated    Consulted and Agree with Plan of Care  Patient         Patient will benefit from skilled therapeutic intervention in order to improve the following deficits and impairments:     Visit Diagnosis: Dizziness and giddiness     Problem List Patient Active Problem List   Diagnosis Date Noted  . Thrombocytopenia ([place]) 04/08/2019  . HTN (hypertension) 07/05/2018  . Tobacco use 07/05/2018  . S/P drug eluting coronary stent placement 03/15/2018  . Unstable angina ([place]) 02/24/2018  . NSTEMI (non-ST elevated myocardial infarction) ([place]) 02/24/2018  . Full thickness rotator cuff tear 07/31/2017   Lady Deutscher PT, DPT [phone] Lady Deutscher 07/26/2019, 11:07 AM  [place] MAIN [facility] SERVICES [address] [place], [place], [address] Phone: [phone]   Fax:  [phone]  Name: Karen Potts MRN: [id number] Date of Birth: August 27, 1960

## 2019-07-28 ENCOUNTER — Ambulatory Visit: Payer: Medicaid Other

## 2019-07-28 ENCOUNTER — Other Ambulatory Visit: Payer: Self-pay

## 2019-07-28 DIAGNOSIS — R42 Dizziness and giddiness: Secondary | ICD-10-CM | POA: Diagnosis not present

## 2019-07-28 DIAGNOSIS — R2689 Other abnormalities of gait and mobility: Secondary | ICD-10-CM

## 2019-07-28 DIAGNOSIS — M1711 Unilateral primary osteoarthritis, right knee: Secondary | ICD-10-CM

## 2019-07-28 DIAGNOSIS — M25551 Pain in right hip: Secondary | ICD-10-CM

## 2019-07-28 NOTE — Therapy (Signed)
[place] [facility] [address] [place], [address] Phone: [phone]   Fax:  [phone]  Physical Therapy Treatment  Patient Details  Name: MARKEYA MINCY MRN: [id number] Date of Birth: Mar 22, 1961 Referring Provider (PT): Gennette Pac MD   Encounter Date: 07/28/2019  PT End of Session - 07/28/19 1557    Visit Number  17    Number of Visits  20    Date for PT Re-Evaluation  07/20/19    Authorization Type  17/20; MEDICAIDE: 07/26/2019 - 09/05/2019 1/12    PT Start Time  1600    PT Stop Time  1645    PT Time Calculation (min)  45 min    Equipment Utilized During Treatment  Gait belt    Activity Tolerance  Patient tolerated treatment well   Treatment limited secondary to vertigo   Behavior During Therapy  [facility] for tasks assessed/performed       Past Medical History:  Diagnosis Date  . Diabetes mellitus without complication ([place])   . Hypertension   . Sleep apnea   . Vertigo     Past Surgical History:  Procedure Laterality Date  . ABDOMINAL HYSTERECTOMY    . BREAST BIOPSY Left 10/2017    APOCRINE TYPE CYST WITH FIBROSIS Of the Wall  . CORONARY STENT INTERVENTION N/A 02/24/2018   Procedure: CORONARY STENT INTERVENTION;  Surgeon: Isaias Cowman, MD;  Location: [place] [facility];  Service: Cardiovascular;  Laterality: N/A;  . LEFT HEART CATH AND CORONARY ANGIOGRAPHY Right 02/24/2018   Procedure: LEFT HEART CATH AND CORONARY ANGIOGRAPHY;  Surgeon: Isaias Cowman, MD;  Location: [place] [facility];  Service: Cardiovascular;  Laterality: Right;  . SHOULDER ARTHROSCOPY WITH OPEN ROTATOR CUFF REPAIR Left 08/14/2017   Procedure: SHOULDER ARTHROSCOPY WITH ROTATOR CUFF REPAIR, BICEPS TENOTOMY, SUBACROMIAL DECOMPRESSION;  Surgeon: Lovell Sheehan, MD;  Location: [facility];  Service: Orthopedics;  Laterality: Left;  . UMBILICAL HERNIA REPAIR      There were no vitals filed for this visit.  Subjective  Assessment - 07/28/19 1600    Subjective  Patient reports that knee has been "not bad", feeling some weakness when going up steps.    Pertinent History  Onset [name] 2020 during treadmill training as part of cardiac rehabilitation. Cortisone shots provide relief for "a couple months"; last shot in June, relief until September. Attempting to increase exercise for weight loss and cardiac health but currently able to walk about "one block" before requiring rest due to knee pain. Recently purchased [facility] for assistance with ambulation. Pain 5/10, 10/10 worst, 0/10 best, with pain in R hip and LBP with increased activity. Agg: walking, standing. Ease: Voltaren, Aspirin, rest. Does not bother her in non-WB. Comorbidities: CAD, HTN, smoker, occasional vertigo "comes and goes every few months", DM2; L shoulder rotator cuff repair with fair recovery.    Limitations  Walking;Standing    How long can you sit comfortably?  unlimited    How long can you stand comfortably?  5 min    How long can you walk comfortably?  20 min    Diagnostic tests  x-ray (per pt report; the cartilage was "gone" on B knees)    Patient Stated Goals  walk with less pain to lose weight    Currently in Pain?  No/denies    Pain Onset  More than a month ago       TREATMENT   TE 10MWT 0.88 m/sec without SPC Lateral step up  6" step 2 x 10 STS with 2" boost x 10 Hip kickers ext with red TB - discontinued due to compensation Hip kicker with slider: ext, abd on R - pt reports LBP, demonstrates lumbar compensation and is not able to correct Mar[place] with SLS holds x 20 R/L - cues for hip stabilization Leg press 55# BLE x 10, 20# RLE x 10  Instruction in LAQ with green theraband    PT Education - 07/28/19 1557    Education Details  form/technique with exercise    Person(s) Educated  Patient    Methods  Explanation;Demonstration;Verbal cues    Comprehension  Verbalized understanding;Returned demonstration;Verbal cues required        PT Short Term Goals - 05/19/19 1723      PT SHORT TERM GOAL #1   Title  Patient will be independent with HEP as adjunct to clinical therapy and to reduce number of visits.    Baseline  HEP given    Time  2    Period  Weeks    Status  Achieved    Target Date  05/18/19      PT SHORT TERM GOAL #2   Title  Patient will demonstrate ability to perform correct gait pattern with SPC 100% of the time for improved energy conservation and functional capacity with ambulation and ADLs.    Baseline  Correct gait pattern 50% of the time    Time  2    Period  Weeks    Status  Achieved    Target Date  05/18/19      PT SHORT TERM GOAL #3   Title  Patient will report 50% or greater improvement in her symptoms of dizziness and imbalance with provoking motions/positions    Baseline  Vertigo 8/10    Time  2    Period  Weeks    Status  On-going    Target Date  05/30/19        PT Long Term Goals - 07/28/19 1654      PT LONG TERM GOAL #1   Title  Patient will improve speed to 0.8 m/sec to achieve community ambulator status and reduce fall risks.    Baseline  0.69 m/sec with SPC; 11/19 0.74; 12/21 deferred; 07/28/2019 0.878    Time  8    Period  Weeks    Status  Achieved      PT LONG TERM GOAL #2   Title  Patient will improve LEFS score by 9 points to achieve MCID for reduced disability and improved function.    Baseline  LEFS 25; 11/19 could not assess; 12/21 deferred for time; 07/28/2019 deferred for time    Time  8    Period  Weeks    Status  Deferred      PT LONG TERM GOAL #3   Title  Patient will reduce worst pain to 6/10 to demonstrate reduced disability with ADLs and improved functional capacity.    Baseline  10/10 worst pain; 11/19 0/10; 12/21 5/10; 07/28/2019 2/10   pain 0/10 following cortisol injection   Time  8    Period  Weeks    Status  Achieved      PT LONG TERM GOAL #4   Title  Patient will report walking tolerance of 45 minutes for for return to cardiac walking  program.    Baseline  walking tolerance 20 min; 11/19 20 min secondary to vertigo; 12/21 20 min; 07/28/2019 cannot assess    Time  8    Period  Weeks    Status  Unable to assess      PT LONG TERM GOAL #5   Title  Patient will report no vertigo with provoking motions or positions in order to be able to perform ADLs and mobility tasks without symptoms.    Baseline  8/10 dizziness with provoking motions; 11/19 8/10    Time  4    Period  Weeks    Status  Achieved      Additional Long Term Goals   Additional Long Term Goals  Yes      PT LONG TERM GOAL #6   Title  Patient will reduce worst pain to 0/10 with gait on stairs for reduced disability with gait.    Baseline  07/28/2019 2/10 on stairs    Time  6    Period  Weeks    Status  New    Target Date  09/08/19      PT LONG TERM GOAL #7   Title  Patient will perform leg press on RLE alone with 40# for 10 reps to demonstrate partity with LLE and prevent long-term increased stress on LLE.    Baseline  RLE 20# x 10 reps    Time  6    Period  Weeks    Status  New    Target Date  09/08/19            Plan - 07/28/19 1746    Clinical Impression Statement  Patient demonstrates gains in reduced pain and vertigo improving therapy tolerance. Early fatigue and weakness of RLE is markedly apparent with leg press of only 20# through full ROM; pt demonstrates gait disturbances following leg press demonstrating fatigue and poor activity tolerance. LBP with hip extension exercises suggests poor lumbopelvic stability or hip extensor weakness as well. Patient will benefit from skilled physical therapy to improve hip and quad strength to normalize gait and reduce knee pain.    Personal Factors and Comorbidities  Age;Comorbidity 3+;Fitness    Comorbidities  CAD, vertigo, HTN, L shoulder pain/weakness    Examination-Activity Limitations  Bathing;Locomotion Level;Bend;Squat;Stairs;Stand;Dressing    Examination-Participation Restrictions   Cleaning;Community Activity;Other   Exercise   Stability/Clinical Decision Making  Evolving/Moderate complexity    Rehab Potential  Good    PT Frequency  2x / week    PT Duration  8 weeks    PT Treatment/Interventions  ADLs/Self Care Home Management;Aquatic Therapy;Cryotherapy;Electrical Stimulation;Moist Heat;DME Instruction;Gait training;Stair training;Functional mobility training;Therapeutic activities;Therapeutic exercise;Balance training;Neuromuscular re-education;Patient/family education;Manual techniques;Passive range of motion;Dry needling;Energy conservation;Taping;Joint Manipulations;Canalith Repostioning    PT Next Visit Plan  Continue RLE strengthening    PT Home Exercise Plan  LAQ against green theraband, standing hip abd    Consulted and Agree with Plan of Care  Patient       Patient will benefit from skilled therapeutic intervention in order to improve the following deficits and impairments:  Abnormal gait, Decreased coordination, Decreased range of motion, Difficulty walking, Decreased endurance, Obesity, Decreased activity tolerance, Pain, Decreased balance, Hypomobility, Improper body mechanics, Impaired flexibility, Decreased mobility, Decreased strength, Increased edema, Postural dysfunction  Visit Diagnosis: Primary osteoarthritis of right knee  Other abnormalities of gait and mobility  Pain in right hip     Problem List Patient Active Problem List   Diagnosis Date Noted  . Thrombocytopenia (HCC) 04/08/2019  . HTN (hypertension) 07/05/2018  . Tobacco use 07/05/2018  . S/P drug eluting coronary stent placement 03/15/2018  . Unstable angina (HCC) 02/24/2018  .  NSTEMI (non-ST elevated myocardial infarction) (HCC) 02/24/2018  . Full thickness rotator cuff tear 07/31/2017    Janee Morn, SPT 07/28/2019, 5:55 PM  [facility] SPORTS MEDICINE [address] [place], [address] Phone: [phone]   Fax:   [phone]  Name: DENNA FRYBERGER MRN: [id number] Date of Birth: 08-23-1960

## 2019-08-02 ENCOUNTER — Encounter: Payer: Medicaid Other | Admitting: Physical Therapy

## 2019-08-04 ENCOUNTER — Other Ambulatory Visit: Payer: Self-pay

## 2019-08-04 ENCOUNTER — Ambulatory Visit: Payer: Medicaid Other | Attending: Family Medicine

## 2019-08-04 DIAGNOSIS — R2689 Other abnormalities of gait and mobility: Secondary | ICD-10-CM | POA: Diagnosis present

## 2019-08-04 DIAGNOSIS — M1711 Unilateral primary osteoarthritis, right knee: Secondary | ICD-10-CM | POA: Diagnosis present

## 2019-08-04 DIAGNOSIS — R42 Dizziness and giddiness: Secondary | ICD-10-CM | POA: Diagnosis present

## 2019-08-04 DIAGNOSIS — M25551 Pain in right hip: Secondary | ICD-10-CM

## 2019-08-04 NOTE — Therapy (Signed)
[facility] [address] [place], [address] Phone: [phone]   Fax:  [phone]  Physical Therapy Treatment/ Progress Note  Patient Details  Name: Karen Potts MRN: [id number] Date of Birth: [birth year] Referring Provider (PT): Toy Cookey MD   Reporting period: 06/26/2020 - 08/04/2019  Encounter Date: 08/04/2019  PT End of Session - 08/04/19 1115    Visit Number  18    Number of Visits  20    Date for PT Re-Evaluation  07/20/19    Authorization Type  17/20; MEDICAIDE: 07/26/2019 - 09/05/2019 1/12    PT Start Time  1115    PT Stop Time  1200    PT Time Calculation (min)  45 min    Equipment Utilized During Treatment  Gait belt    Activity Tolerance  Patient tolerated treatment well   Treatment limited secondary to vertigo   Behavior During Therapy  [facility] for tasks assessed/performed       Past Medical History:  Diagnosis Date  . Diabetes mellitus without complication (HCC)   . Hypertension   . Sleep apnea   . Vertigo     Past Surgical History:  Procedure Laterality Date  . ABDOMINAL HYSTERECTOMY    . BREAST BIOPSY Left 10/2017    APOCRINE TYPE CYST WITH FIBROSIS Of the Wall  . CORONARY STENT INTERVENTION N/A 02/24/2018   Procedure: CORONARY STENT INTERVENTION;  Surgeon: Marcina Millard, MD;  Location: [facility];  Service: Cardiovascular;  Laterality: N/A;  . LEFT HEART CATH AND CORONARY ANGIOGRAPHY Right 02/24/2018   Procedure: LEFT HEART CATH AND CORONARY ANGIOGRAPHY;  Surgeon: Marcina Millard, MD;  Location: [facility];  Service: Cardiovascular;  Laterality: Right;  . SHOULDER ARTHROSCOPY WITH OPEN ROTATOR CUFF REPAIR Left 08/14/2017   Procedure: SHOULDER ARTHROSCOPY WITH ROTATOR CUFF REPAIR, BICEPS TENOTOMY, SUBACROMIAL DECOMPRESSION;  Surgeon: Lyndle Herrlich, MD;  Location: [facility];  Service: Orthopedics;  Laterality: Left;  . UMBILICAL HERNIA REPAIR       There were no vitals filed for this visit.  Subjective Assessment - 08/04/19 1117    Subjective  Patient reports doing some walking. Knee has been aching. Reports no episdoes of vertigo.    Pertinent History  Onset [name] 2020 during treadmill training as part of cardiac rehabilitation. Cortisone shots provide relief for "a couple months"; last shot in June, relief until September. Attempting to increase exercise for weight loss and cardiac health but currently able to walk about "one block" before requiring rest due to knee pain. Recently purchased [facility] for assistance with ambulation. Pain 5/10, 10/10 worst, 0/10 best, with pain in R hip and LBP with increased activity. Agg: walking, standing. Ease: Voltaren, Aspirin, rest. Does not bother her in non-WB. Comorbidities: CAD, HTN, smoker, occasional vertigo "comes and goes every few months", DM2; L shoulder rotator cuff repair with fair recovery.    Limitations  Walking;Standing    How long can you sit comfortably?  unlimited    How long can you stand comfortably?  5 min    How long can you walk comfortably?  20 min    Diagnostic tests  x-ray (per pt report; the cartilage was "gone" on B knees)    Patient Stated Goals  walk with less pain to lose weight    Currently in Pain?  Yes    Pain Score  4     Pain Location  Knee    Pain Orientation  Right  Pain Descriptors / Indicators  Aching    Pain Type  Chronic pain    Pain Onset  More than a month ago         TREATMENT   TE [place]ges x 10 - note lumbar compensation, improved with cues to encourage hip extension SLR x 5 R/L - belt assist on R SAQ x 10 - note medial quad activation before lateral Hooklying hip abd red theraband 5 sec hold 2 x 10 Standing slider hip flexion/extension with cues for neutral rotation in RLE x 20 - for improved lateral quad activation Slider hip circumduction over LLE x 20 circles Seated LAQ x 10 R SLS exercises attempted over RLE but pt reported knee pain in  standing; postural corrections reduced knee pain but did not eliminate.  TE for RLE strengthening   MT Soft tissue mobilization of R quad x 15 min with focus on distal quad and proximal rectus for pain relief and improved therapy tolerance          PT Education - 08/04/19 1114    Education Details  form/technique with exercise; reinforced recovery strategies    Person(s) Educated  Patient    Methods  Explanation;Demonstration;Tactile cues;Verbal cues    Comprehension  Verbalized understanding;Returned demonstration;Verbal cues required;Tactile cues required       PT Short Term Goals - 05/19/19 1723      PT SHORT TERM GOAL #1   Title  Patient will be independent with HEP as adjunct to clinical therapy and to reduce number of visits.    Baseline  HEP given    Time  2    Period  Weeks    Status  Achieved    Target Date  05/18/19      PT SHORT TERM GOAL #2   Title  Patient will demonstrate ability to perform correct gait pattern with SPC 100% of the time for improved energy conservation and functional capacity with ambulation and ADLs.    Baseline  Correct gait pattern 50% of the time    Time  2    Period  Weeks    Status  Achieved    Target Date  05/18/19      PT SHORT TERM GOAL #3   Title  Patient will report 50% or greater improvement in her symptoms of dizziness and imbalance with provoking motions/positions    Baseline  Vertigo 8/10    Time  2    Period  Weeks    Status  On-going    Target Date  05/30/19        PT Long Term Goals - 07/28/19 1654      PT LONG TERM GOAL #1   Title  Patient will improve speed to 0.8 m/sec to achieve community ambulator status and reduce fall risks.    Baseline  0.69 m/sec with SPC; 11/19 0.74; 12/21 deferred; 07/28/2019 0.878    Time  8    Period  Weeks    Status  Achieved      PT LONG TERM GOAL #2   Title  Patient will improve LEFS score by 9 points to achieve MCID for reduced disability and improved function.     Baseline  LEFS 25; 11/19 could not assess; 12/21 deferred for time; 07/28/2019 deferred for time    Time  8    Period  Weeks    Status  Deferred      PT LONG TERM GOAL #3   Title  Patient will reduce  worst pain to 6/10 to demonstrate reduced disability with ADLs and improved functional capacity.    Baseline  10/10 worst pain; 11/19 0/10; 12/21 5/10; 07/28/2019 2/10   pain 0/10 following cortisol injection   Time  8    Period  Weeks    Status  Achieved      PT LONG TERM GOAL #4   Title  Patient will report walking tolerance of 45 minutes for for return to cardiac walking program.    Baseline  walking tolerance 20 min; 11/19 20 min secondary to vertigo; 12/21 20 min; 07/28/2019 cannot assess    Time  8    Period  Weeks    Status  Unable to assess      PT LONG TERM GOAL #5   Title  Patient will report no vertigo with provoking motions or positions in order to be able to perform ADLs and mobility tasks without symptoms.    Baseline  8/10 dizziness with provoking motions; 11/19 8/10    Time  4    Period  Weeks    Status  Achieved      Additional Long Term Goals   Additional Long Term Goals  Yes      PT LONG TERM GOAL #6   Title  Patient will reduce worst pain to 0/10 with gait on stairs for reduced disability with gait.    Baseline  07/28/2019 2/10 on stairs    Time  6    Period  Weeks    Status  New    Target Date  09/08/19      PT LONG TERM GOAL #7   Title  Patient will perform leg press on RLE alone with 40# for 10 reps to demonstrate partity with LLE and prevent long-term increased stress on LLE.    Baseline  RLE 20# x 10 reps    Time  6    Period  Weeks    Status  New    Target Date  09/08/19            Plan - 08/04/19 1206    Clinical Impression Statement  Patient able to tolerate supine today without vertigo sx. Early fatigue evident with R quadriceps requiring STM to calm irritation; note medial activation prior to lateral with SAQ and adductor compensation with  SLR. Plan to continue with gently graded therex for R quad and hip extension to normalize gait and reduce knee pain.    Personal Factors and Comorbidities  Age;Comorbidity 3+;Fitness    Comorbidities  CAD, vertigo, HTN, L shoulder pain/weakness    Examination-Activity Limitations  Bathing;Locomotion Level;Bend;Squat;Stairs;Stand;Dressing    Examination-Participation Restrictions  Cleaning;Community Activity;Other   Exercise   Stability/Clinical Decision Making  Evolving/Moderate complexity    Rehab Potential  Good    PT Frequency  2x / week    PT Duration  8 weeks    PT Treatment/Interventions  ADLs/Self Care Home Management;Aquatic Therapy;Cryotherapy;Electrical Stimulation;Moist Heat;DME Instruction;Gait training;Stair training;Functional mobility training;Therapeutic activities;Therapeutic exercise;Balance training;Neuromuscular re-education;Patient/family education;Manual techniques;Passive range of motion;Dry needling;Energy conservation;Taping;Joint Manipulations;Canalith Repostioning    PT Next Visit Plan  Quad therex against band, leg press; hip extension therex; STS; hip kickers    PT Home Exercise Plan  LAQ against green theraband, standing hip abd    Consulted and Agree with Plan of Care  Patient       Patient will benefit from skilled therapeutic intervention in order to improve the following deficits and impairments:  Abnormal gait, Decreased coordination, Decreased range of  motion, Difficulty walking, Decreased endurance, Obesity, Decreased activity tolerance, Pain, Decreased balance, Hypomobility, Improper body mechanics, Impaired flexibility, Decreased mobility, Decreased strength, Increased edema, Postural dysfunction  Visit Diagnosis: Primary osteoarthritis of right knee  Other abnormalities of gait and mobility  Pain in right hip     Problem List Patient Active Problem List   Diagnosis Date Noted  . Thrombocytopenia (HCC) 04/08/2019  . HTN (hypertension) 07/05/2018   . Tobacco use 07/05/2018  . S/P drug eluting coronary stent placement 03/15/2018  . Unstable angina (HCC) 02/24/2018  . NSTEMI (non-ST elevated myocardial infarction) (HCC) 02/24/2018  . Full thickness rotator cuff tear 07/31/2017    Janee Morn, SPT 08/04/2019, 12:10 PM  [facility] PHYSICAL AND SPORTS MEDICINE [address] [place], [address] Phone: [phone]   Fax:  [phone]  Name: Karen Potts MRN: [id number] Date of Birth: 10/27/1960

## 2019-08-09 ENCOUNTER — Other Ambulatory Visit: Payer: Self-pay

## 2019-08-09 ENCOUNTER — Ambulatory Visit: Payer: Medicaid Other | Admitting: Physical Therapy

## 2019-08-09 ENCOUNTER — Ambulatory Visit: Payer: Medicaid Other

## 2019-08-09 DIAGNOSIS — M1711 Unilateral primary osteoarthritis, right knee: Secondary | ICD-10-CM

## 2019-08-09 DIAGNOSIS — M25551 Pain in right hip: Secondary | ICD-10-CM

## 2019-08-09 DIAGNOSIS — R2689 Other abnormalities of gait and mobility: Secondary | ICD-10-CM

## 2019-08-09 NOTE — Therapy (Signed)
[facility]ICINE [address] [place], [address] Phone: [phone]   Fax:  [phone]  Physical Therapy Treatment  Patient Details  Name: Karen Potts MRN: [id number] Date of Birth: [birth year] Referring Provider (PT): Toy Cookey MD   Encounter Date: 08/09/2019  PT End of Session - 08/09/19 1150    Visit Number  19    Number of Visits  20    Date for PT Re-Evaluation  09/01/19    Authorization Type  18/20; MEDICAIDE: 07/26/2019 - 09/05/2019 2/12    PT Start Time  1115    PT Stop Time  1200    PT Time Calculation (min)  45 min    Equipment Utilized During Treatment  Gait belt    Activity Tolerance  Patient tolerated treatment well   Treatment limited secondary to vertigo   Behavior During Therapy  [facility] for tasks assessed/performed       Past Medical History:  Diagnosis Date  . Diabetes mellitus without complication (HCC)   . Hypertension   . Sleep apnea   . Vertigo     Past Surgical History:  Procedure Laterality Date  . ABDOMINAL HYSTERECTOMY    . BREAST BIOPSY Left 10/2017    APOCRINE TYPE CYST WITH FIBROSIS Of the Wall  . CORONARY STENT INTERVENTION N/A 02/24/2018   Procedure: CORONARY STENT INTERVENTION;  Surgeon: Marcina Millard, MD;  Location: [facility] INVASIVE CV LAB;  Service: Cardiovascular;  Laterality: N/A;  . LEFT HEART CATH AND CORONARY ANGIOGRAPHY Right 02/24/2018   Procedure: LEFT HEART CATH AND CORONARY ANGIOGRAPHY;  Surgeon: Marcina Millard, MD;  Location: [facility] INVASIVE CV LAB;  Service: Cardiovascular;  Laterality: Right;  . SHOULDER ARTHROSCOPY WITH OPEN ROTATOR CUFF REPAIR Left 08/14/2017   Procedure: SHOULDER ARTHROSCOPY WITH ROTATOR CUFF REPAIR, BICEPS TENOTOMY, SUBACROMIAL DECOMPRESSION;  Surgeon: Lyndle Herrlich, MD;  Location: [facility] ORS;  Service: Orthopedics;  Laterality: Left;  . UMBILICAL HERNIA REPAIR      There were no vitals filed for this visit.  Subjective  Assessment - 08/09/19 1123    Subjective  Patient reports doing some walking over the weekend about 15 minutes. No issues w/ vertigo over weekend as well. Patient states she had some N/T on LLE over weekend    Pertinent History  Onset [name] 2020 during treadmill training as part of cardiac rehabilitation. Cortisone shots provide relief for "a couple months"; last shot in June, relief until September. Attempting to increase exercise for weight loss and cardiac health but currently able to walk about "one block" before requiring rest due to knee pain. Recently purchased [facility] for assistance with ambulation. Pain 5/10, 10/10 worst, 0/10 best, with pain in R hip and LBP with increased activity. Agg: walking, standing. Ease: Voltaren, Aspirin, rest. Does not bother her in non-WB. Comorbidities: CAD, HTN, smoker, occasional vertigo "comes and goes every few months", DM2; L shoulder rotator cuff repair with fair recovery.    Limitations  Walking;Standing    How long can you sit comfortably?  unlimited    How long can you stand comfortably?  5 min    How long can you walk comfortably?  20 min    Diagnostic tests  x-ray (per pt report; the cartilage was "gone" on B knees)    Patient Stated Goals  walk with less pain to lose weight    Currently in Pain?  Yes    Pain Score  5     Pain Location  Knee  Pain Orientation  Right    Pain Descriptors / Indicators  Aching    Pain Onset  More than a month ago        TREATMENT  TE Heel Slides into Hip flexion  OKC leg extensions w/ 4# 2 x 10  Pregait loading phase (motor control) x 30 ea  TKE w/ Red theraband 2 x 10   MT STM over pes anserine x 1 minute targeting gracilis  TibioFemoral Distraction mob x 30  Tibiofemoral Internal rotation mob x 30  Tibiofibular posterior mob x 30    Therapeutic exercise and manual therapy utilized to strengthen the quads w/o hip rotation and decreased pain at the knee      PT Education - 08/09/19 1132    Education  Details  form/technique w/ exercise    Person(s) Educated  Patient    Methods  Explanation;Demonstration;Tactile cues;Verbal cues    Comprehension  Verbalized understanding;Returned demonstration;Verbal cues required;Tactile cues required       PT Short Term Goals - 08/04/19 1416      PT SHORT TERM GOAL #1   Title  Patient will be independent with HEP as adjunct to clinical therapy and to reduce number of visits.    Baseline  HEP given    Time  2    Period  Weeks    Status  Achieved    Target Date  05/18/19      PT SHORT TERM GOAL #2   Title  Patient will demonstrate ability to perform correct gait pattern with SPC 100% of the time for improved energy conservation and functional capacity with ambulation and ADLs.    Baseline  Correct gait pattern 50% of the time    Time  2    Period  Weeks    Status  Achieved    Target Date  05/18/19      PT SHORT TERM GOAL #3   Title  Patient will report 50% or greater improvement in her symptoms of dizziness and imbalance with provoking motions/positions    Baseline  Vertigo 8/10; 08/04/2019: 90% improvement    Time  2    Period  Weeks    Status  Achieved    Target Date  05/30/19        PT Long Term Goals - 08/04/19 1418      PT LONG TERM GOAL #1   Title  Patient will improve 10MW speed to 0.8 m/sec to achieve community ambulator status and reduce fall risks.    Baseline  0.69 m/sec with SPC; 11/19 0.74; 12/21 deferred; 07/28/2019 0.878    Time  8    Period  Weeks    Status  Achieved      PT LONG TERM GOAL #2   Title  Patient will improve LEFS score by 9 points to achieve MCID for reduced disability and improved function.    Baseline  LEFS 25; 11/19 could not assess; 12/21 deferred for time; 07/28/2019 deferred for time    Time  8    Period  Weeks    Status  Deferred      PT LONG TERM GOAL #3   Title  Patient will reduce worst pain to 6/10 to demonstrate reduced disability with ADLs and improved functional capacity.    Baseline   10/10 worst pain; 11/19 0/10; 12/21 5/10; 07/28/2019 2/10   pain 0/10 following cortisol injection   Time  8    Period  Weeks    Status  Achieved  PT LONG TERM GOAL #4   Title  Patient will report walking tolerance of 45 minutes for for return to cardiac walking program.    Baseline  walking tolerance 20 min; 11/19 20 min secondary to vertigo; 12/21 20 min; 07/28/2019 cannot assess    Time  8    Period  Weeks    Status  Unable to assess      PT LONG TERM GOAL #5   Title  Patient will report no vertigo with provoking motions or positions in order to be able to perform ADLs and mobility tasks without symptoms.    Baseline  8/10 dizziness with provoking motions; 11/19 8/10    Time  4    Period  Weeks    Status  Achieved      PT LONG TERM GOAL #6   Title  Patient will reduce worst pain to 0/10 with gait on stairs for reduced disability with gait.    Baseline  07/28/2019 2/10 on stairs    Time  6    Period  Weeks    Status  New      PT LONG TERM GOAL #7   Title  Patient will perform leg press on RLE alone with 40# for 10 reps to demonstrate partity with LLE and prevent long-term increased stress on LLE.    Baseline  RLE 20# x 10 reps    Time  6    Period  Weeks    Status  New            Plan - 08/09/19 1218    Clinical Impression Statement  Patient demonstrated pain in terminal extension position w tibial external rotation, most likely due to increase tension on medial compartment of knee and misalignment of articular surfaces. Patient demonstrates adductor compensation w/ gait and fatigue noted w/ knee extension based exercises. Patient will progress w/ motor control in gait and quad strengthening to correct the adductor compensation and articular misalignemnt at tibiofemoral joint. Patient will continue to benefit from skilled therapy to return to walking w/o pain    Personal Factors and Comorbidities  Age;Comorbidity 3+;Fitness    Comorbidities  CAD, vertigo, HTN, L  shoulder pain/weakness    Examination-Activity Limitations  Bathing;Locomotion Level;Bend;Squat;Stairs;Stand;Dressing    Examination-Participation Restrictions  Cleaning;Community Activity;Other   Exercise   Stability/Clinical Decision Making  Evolving/Moderate complexity    Rehab Potential  Good    PT Frequency  2x / week    PT Duration  8 weeks    PT Treatment/Interventions  ADLs/Self Care Home Management;Aquatic Therapy;Cryotherapy;Electrical Stimulation;Moist Heat;DME Instruction;Gait training;Stair training;Functional mobility training;Therapeutic activities;Therapeutic exercise;Balance training;Neuromuscular re-education;Patient/family education;Manual techniques;Passive range of motion;Dry needling;Energy conservation;Taping;Joint Manipulations;Canalith Repostioning    PT Next Visit Plan  Quad therex against band, leg press; hip extension therex; STS; hip kickers    PT Home Exercise Plan  LAQ against green theraband, standing hip abd    Consulted and Agree with Plan of Care  Patient       Patient will benefit from skilled therapeutic intervention in order to improve the following deficits and impairments:  Abnormal gait, Decreased coordination, Decreased range of motion, Difficulty walking, Decreased endurance, Obesity, Decreased activity tolerance, Pain, Decreased balance, Hypomobility, Improper body mechanics, Impaired flexibility, Decreased mobility, Decreased strength, Increased edema, Postural dysfunction  Visit Diagnosis: Primary osteoarthritis of right knee  Pain in right hip  Other abnormalities of gait and mobility     Problem List Patient Active Problem List   Diagnosis Date Noted  . Thrombocytopenia (  HCC) 04/08/2019  . HTN (hypertension) 07/05/2018  . Tobacco use 07/05/2018  . S/P drug eluting coronary stent placement 03/15/2018  . Unstable angina (HCC) 02/24/2018  . NSTEMI (non-ST elevated myocardial infarction) (HCC) 02/24/2018  . Full thickness rotator cuff  tear 07/31/2017    Harmon Pier, SPT  08/09/2019, 12:24 PM  [facility] PHYSICAL AND SPORTS MEDICINE [address] [place], [address] Phone: [phone]   Fax:  [phone]  Name: Karen Potts MRN: [id number] Date of Birth: September 21, 1960

## 2019-08-11 ENCOUNTER — Other Ambulatory Visit: Payer: Self-pay

## 2019-08-11 ENCOUNTER — Ambulatory Visit: Payer: Medicaid Other

## 2019-08-11 DIAGNOSIS — M1711 Unilateral primary osteoarthritis, right knee: Secondary | ICD-10-CM | POA: Diagnosis not present

## 2019-08-11 DIAGNOSIS — M25551 Pain in right hip: Secondary | ICD-10-CM

## 2019-08-11 DIAGNOSIS — R2689 Other abnormalities of gait and mobility: Secondary | ICD-10-CM

## 2019-08-11 DIAGNOSIS — R42 Dizziness and giddiness: Secondary | ICD-10-CM

## 2019-08-11 NOTE — Therapy (Signed)
[place] [facility]TS MEDICINE [address] [place], [address] Phone: [phone]   Fax:  [phone]  Physical Therapy Treatment  Patient Details  Name: Karen Potts MRN: [id number] Date of Birth: [birth year] Referring Provider (PT): Toy Cookey MD   Encounter Date: 08/11/2019  PT End of Session - 08/11/19 1215    Visit Number  20    Number of Visits  20    Date for PT Re-Evaluation  09/01/19    Authorization Type  19/20; MEDICAIDE: 07/26/2019 - 09/05/2019 4/12    PT Start Time  1115    PT Stop Time  1200    PT Time Calculation (min)  45 min    Equipment Utilized During Treatment  Gait belt    Activity Tolerance  Patient tolerated treatment well   Treatment limited secondary to vertigo   Behavior During Therapy  [facility] for tasks assessed/performed       Past Medical History:  Diagnosis Date  . Diabetes mellitus without complication (HCC)   . Hypertension   . Sleep apnea   . Vertigo     Past Surgical History:  Procedure Laterality Date  . ABDOMINAL HYSTERECTOMY    . BREAST BIOPSY Left 10/2017    APOCRINE TYPE CYST WITH FIBROSIS Of the Wall  . CORONARY STENT INTERVENTION N/A 02/24/2018   Procedure: CORONARY STENT INTERVENTION;  Surgeon: Marcina Millard, MD;  Location: [facility] INVASIVE CV LAB;  Service: Cardiovascular;  Laterality: N/A;  . LEFT HEART CATH AND CORONARY ANGIOGRAPHY Right 02/24/2018   Procedure: LEFT HEART CATH AND CORONARY ANGIOGRAPHY;  Surgeon: Marcina Millard, MD;  Location: [facility] INVASIVE CV LAB;  Service: Cardiovascular;  Laterality: Right;  . SHOULDER ARTHROSCOPY WITH OPEN ROTATOR CUFF REPAIR Left 08/14/2017   Procedure: SHOULDER ARTHROSCOPY WITH ROTATOR CUFF REPAIR, BICEPS TENOTOMY, SUBACROMIAL DECOMPRESSION;  Surgeon: Lyndle Herrlich, MD;  Location: [facility] ORS;  Service: Orthopedics;  Laterality: Left;  . UMBILICAL HERNIA REPAIR      There were no vitals filed for this visit.  Subjective  Assessment - 08/11/19 1115    Subjective  patient reports no pain but feeling a bit achy today. She has been consistent w/ doing her HEP    Pertinent History  Onset [name] 2020 during treadmill training as part of cardiac rehabilitation. Cortisone shots provide relief for "a couple months"; last shot in June, relief until September. Attempting to increase exercise for weight loss and cardiac health but currently able to walk about "one block" before requiring rest due to knee pain. Recently purchased [facility] for assistance with ambulation. Pain 5/10, 10/10 worst, 0/10 best, with pain in R hip and LBP with increased activity. Agg: walking, standing. Ease: Voltaren, Aspirin, rest. Does not bother her in non-WB. Comorbidities: CAD, HTN, smoker, occasional vertigo "comes and goes every few months", DM2; L shoulder rotator cuff repair with fair recovery.    Limitations  Walking;Standing    How long can you sit comfortably?  unlimited    How long can you stand comfortably?  5 min    How long can you walk comfortably?  20 min    Diagnostic tests  x-ray (per pt report; the cartilage was "gone" on B knees)    Patient Stated Goals  walk with less pain to lose weight    Currently in Pain?  No/denies      TREATMENT  TE Glute Bridges 2 x 8 ea Clam Shells w/ YTB 2 x 10  STS 3 x 5 (  increased table height with each step and knee pain initiated near end range) Lateral Shuffles x 5ft ea AROM on green ball x 2 minutes  MT Tibiofemoral Rotational Joint mobs 2 x 30 ea Tibiofemoral Distraction Mob 2 x 30 ea STM over gracilis sartorius, quads and lumbar extensors x 1 minute each  Modalities Moist Heat placed along the anterior aspect of the quad - 5 minutes post tx due to muscle spasms and fatigue post session. Skin checked after modality performed with no adverse skin reactions noted   Therapeutic exercises and manual therapy conducted to decrease knee pain and increase hip and knee musculature strength  PT  Short Term Goals - 08/04/19 1416      PT SHORT TERM GOAL #1   Title  Patient will be independent with HEP as adjunct to clinical therapy and to reduce number of visits.    Baseline  HEP given    Time  2    Period  Weeks    Status  Achieved    Target Date  05/18/19      PT SHORT TERM GOAL #2   Title  Patient will demonstrate ability to perform correct gait pattern with SPC 100% of the time for improved energy conservation and functional capacity with ambulation and ADLs.    Baseline  Correct gait pattern 50% of the time    Time  2    Period  Weeks    Status  Achieved    Target Date  05/18/19      PT SHORT TERM GOAL #3   Title  Patient will report 50% or greater improvement in her symptoms of dizziness and imbalance with provoking motions/positions    Baseline  Vertigo 8/10; 08/04/2019: 90% improvement    Time  2    Period  Weeks    Status  Achieved    Target Date  05/30/19        PT Long Term Goals - 08/04/19 1418      PT LONG TERM GOAL #1   Title  Patient will improve speed to 0.8 m/sec to achieve community ambulator status and reduce fall risks.    Baseline  0.69 m/sec with SPC; 11/19 0.74; 12/21 deferred; 07/28/2019 0.878    Time  8    Period  Weeks    Status  Achieved      PT LONG TERM GOAL #2   Title  Patient will improve LEFS score by 9 points to achieve MCID for reduced disability and improved function.    Baseline  LEFS 25; 11/19 could not assess; 12/21 deferred for time; 07/28/2019 deferred for time    Time  8    Period  Weeks    Status  Deferred      PT LONG TERM GOAL #3   Title  Patient will reduce worst pain to 6/10 to demonstrate reduced disability with ADLs and improved functional capacity.    Baseline  10/10 worst pain; 11/19 0/10; 12/21 5/10; 07/28/2019 2/10   pain 0/10 following cortisol injection   Time  8    Period  Weeks    Status  Achieved      PT LONG TERM GOAL #4   Title  Patient will report walking tolerance of 45 minutes for for return to  cardiac walking program.    Baseline  walking tolerance 20 min; 11/19 20 min secondary to vertigo; 12/21 20 min; 07/28/2019 cannot assess    Time  8    Period  Weeks    Status  Unable to assess      PT LONG TERM GOAL #5   Title  Patient will report no vertigo with provoking motions or positions in order to be able to perform ADLs and mobility tasks without symptoms.    Baseline  8/10 dizziness with provoking motions; 11/19 8/10    Time  4    Period  Weeks    Status  Achieved      PT LONG TERM GOAL #6   Title  Patient will reduce worst pain to 0/10 with gait on stairs for reduced disability with gait.    Baseline  07/28/2019 2/10 on stairs    Time  6    Period  Weeks    Status  New      PT LONG TERM GOAL #7   Title  Patient will perform leg press on RLE alone with 40# for 10 reps to demonstrate partity with LLE and prevent long-term increased stress on LLE.    Baseline  RLE 20# x 10 reps    Time  6    Period  Weeks    Status  New        Plan - 08/11/19 1226    Clinical Impression Statement  Patient demonstrated pain at the end range of her STS most likely due to increased pressure over medial knee within tibiofemoral joint space. Patient showed improvement in pain and function w/ AROM prior to intervention and would benefit from it for next session. Patient demonstrates an inability to tolerate high number of sets w/ various LE exercises most likely due to gross LE muscular endurance. Patient would continue to benefit from low level strengthening exercises that are monitored for fatigue levels throughout each exercise. Patient require continued skilled therapy to return her to PLOF.    Personal Factors and Comorbidities  Age;Comorbidity 3+;Fitness    Comorbidities  CAD, vertigo, HTN, L shoulder pain/weakness    Examination-Activity Limitations  Bathing;Locomotion Level;Bend;Squat;Stairs;Stand;Dressing    Examination-Participation Restrictions  Cleaning;Community Activity;Other    Exercise   Stability/Clinical Decision Making  Evolving/Moderate complexity    Rehab Potential  Good    PT Frequency  2x / week    PT Duration  8 weeks    PT Treatment/Interventions  ADLs/Self Care Home Management;Aquatic Therapy;Cryotherapy;Electrical Stimulation;Moist Heat;DME Instruction;Gait training;Stair training;Functional mobility training;Therapeutic activities;Therapeutic exercise;Balance training;Neuromuscular re-education;Patient/family education;Manual techniques;Passive range of motion;Dry needling;Energy conservation;Taping;Joint Manipulations;Canalith Repostioning    PT Next Visit Plan  Quad therex against band, leg press; hip extension therex; STS; hip kickers    PT Home Exercise Plan  LAQ against green theraband, standing hip abd    Consulted and Agree with Plan of Care  Patient       Patient will benefit from skilled therapeutic intervention in order to improve the following deficits and impairments:  Abnormal gait, Decreased coordination, Decreased range of motion, Difficulty walking, Decreased endurance, Obesity, Decreased activity tolerance, Pain, Decreased balance, Hypomobility, Improper body mechanics, Impaired flexibility, Decreased mobility, Decreased strength, Increased edema, Postural dysfunction  Visit Diagnosis: Primary osteoarthritis of right knee  Other abnormalities of gait and mobility  Pain in right hip  Dizziness and giddiness     Problem List Patient Active Problem List   Diagnosis Date Noted  . Thrombocytopenia ([place]) 04/08/2019  . HTN (hypertension) 07/05/2018  . Tobacco use 07/05/2018  . S/P drug eluting coronary stent placement 03/15/2018  . Unstable angina ([place]) 02/24/2018  . NSTEMI (non-ST elevated myocardial infarction) ([place]) 02/24/2018  . Full  thickness rotator cuff tear 07/31/2017    Harmon Pier, SPT 08/11/2019, 12:35 PM  [place] [facility] PHYSICAL AND SPORTS MEDICINE [address]  [place], [address] Phone: [phone]   Fax:  [phone]  Name: Karen Potts MRN: [id number] Date of Birth: 1961-03-11

## 2019-08-16 ENCOUNTER — Other Ambulatory Visit: Payer: Self-pay

## 2019-08-16 ENCOUNTER — Ambulatory Visit: Payer: Medicaid Other

## 2019-08-16 DIAGNOSIS — M1711 Unilateral primary osteoarthritis, right knee: Secondary | ICD-10-CM

## 2019-08-16 DIAGNOSIS — R42 Dizziness and giddiness: Secondary | ICD-10-CM

## 2019-08-16 DIAGNOSIS — R2689 Other abnormalities of gait and mobility: Secondary | ICD-10-CM

## 2019-08-16 NOTE — Therapy (Signed)
[facility] [address] [place], [address] Phone: [phone]   Fax:  [phone]  Physical Therapy Treatment  Patient Details  Name: Karen Potts MRN: [id number] Date of Birth: 1960-12-16 Referring Provider (PT): Gennette Pac MD   Encounter Date: 08/16/2019  PT End of Session - 08/16/19 1120    Number of Visits  20    Date for PT Re-Evaluation  09/01/19    Authorization Type  20/20; MEDICAIDE: 07/26/2019 - 09/05/2019 5/12    PT Start Time  1115    PT Stop Time  1200    PT Time Calculation (min)  45 min    Equipment Utilized During Treatment  Gait belt    Activity Tolerance  Patient tolerated treatment well   Treatment limited secondary to vertigo   Behavior During Therapy  [facility] for tasks assessed/performed       Past Medical History:  Diagnosis Date  . Diabetes mellitus without complication ([place])   . Hypertension   . Sleep apnea   . Vertigo     Past Surgical History:  Procedure Laterality Date  . ABDOMINAL HYSTERECTOMY    . BREAST BIOPSY Left 10/2017    APOCRINE TYPE CYST WITH FIBROSIS Of the Wall  . CORONARY STENT INTERVENTION N/A 02/24/2018   Procedure: CORONARY STENT INTERVENTION;  Surgeon: Isaias Cowman, MD;  Location: [place] [facility];  Service: Cardiovascular;  Laterality: N/A;  . LEFT HEART CATH AND CORONARY ANGIOGRAPHY Right 02/24/2018   Procedure: LEFT HEART CATH AND CORONARY ANGIOGRAPHY;  Surgeon: Isaias Cowman, MD;  Location: [place] [facility];  Service: Cardiovascular;  Laterality: Right;  . SHOULDER ARTHROSCOPY WITH OPEN ROTATOR CUFF REPAIR Left 08/14/2017   Procedure: SHOULDER ARTHROSCOPY WITH ROTATOR CUFF REPAIR, BICEPS TENOTOMY, SUBACROMIAL DECOMPRESSION;  Surgeon: Lovell Sheehan, MD;  Location: [facility];  Service: Orthopedics;  Laterality: Left;  . UMBILICAL HERNIA REPAIR      There were no vitals filed for this visit.  Subjective Assessment - 08/16/19  1118    Subjective  Patient reports some achiness. Patient stayed home a bit due to rain over weekend    Pertinent History  Onset [name] 2020 during treadmill training as part of cardiac rehabilitation. Cortisone shots provide relief for "a couple months"; last shot in June, relief until September. Attempting to increase exercise for weight loss and cardiac health but currently able to walk about "one block" before requiring rest due to knee pain. Recently purchased [facility] for assistance with ambulation. Pain 5/10, 10/10 worst, 0/10 best, with pain in R hip and LBP with increased activity. Agg: walking, standing. Ease: Voltaren, Aspirin, rest. Does not bother her in non-WB. Comorbidities: CAD, HTN, smoker, occasional vertigo "comes and goes every few months", DM2; L shoulder rotator cuff repair with fair recovery.    Limitations  Walking;Standing    How long can you sit comfortably?  unlimited    How long can you stand comfortably?  5 min    How long can you walk comfortably?  20 min    Diagnostic tests  x-ray (per pt report; the cartilage was "gone" on B knees)    Patient Stated Goals  walk with less pain to lose weight    Currently in Pain?  Yes    Pain Score  5         TREATMENT  MT STM over quads x 5 minutes (applied throughout various points in treatment) Active release w/ lacrosse ball x 30  seconds TF joint distraction mob 3 x 30 seconds  Manual therapy performed to decrease pain and improve standing potential with patient  TE AROM on bball x 5 minutes  Leg extensions w/ GTB 2 x 12  STS x 8 cue for pushing knees out (patient was unable to tolerate end range due to pain) Leg Press 2 x 12 w/ 35#   Therapeutic exercises and manual therapy conducted to decrease pain in the knee and increase strength in the quads   PT Education - 08/16/19 1120    Education Details  form/technique w/ exercise    Person(s) Educated  Patient    Methods  Explanation;Demonstration;Tactile cues;Verbal cues     Comprehension  Verbalized understanding;Returned demonstration;Verbal cues required;Tactile cues required       PT Short Term Goals - 08/04/19 1416      PT SHORT TERM GOAL #1   Title  Patient will be independent with HEP as adjunct to clinical therapy and to reduce number of visits.    Baseline  HEP given    Time  2    Period  Weeks    Status  Achieved    Target Date  05/18/19      PT SHORT TERM GOAL #2   Title  Patient will demonstrate ability to perform correct gait pattern with SPC 100% of the time for improved energy conservation and functional capacity with ambulation and ADLs.    Baseline  Correct gait pattern 50% of the time    Time  2    Period  Weeks    Status  Achieved    Target Date  05/18/19      PT SHORT TERM GOAL #3   Title  Patient will report 50% or greater improvement in her symptoms of dizziness and imbalance with provoking motions/positions    Baseline  Vertigo 8/10; 08/04/2019: 90% improvement    Time  2    Period  Weeks    Status  Achieved    Target Date  05/30/19        PT Long Term Goals - 08/04/19 1418      PT LONG TERM GOAL #1   Title  Patient will improve speed to 0.8 m/sec to achieve community ambulator status and reduce fall risks.    Baseline  0.69 m/sec with SPC; 11/19 0.74; 12/21 deferred; 07/28/2019 0.878    Time  8    Period  Weeks    Status  Achieved      PT LONG TERM GOAL #2   Title  Patient will improve LEFS score by 9 points to achieve MCID for reduced disability and improved function.    Baseline  LEFS 25; 11/19 could not assess; 12/21 deferred for time; 07/28/2019 deferred for time    Time  8    Period  Weeks    Status  Deferred      PT LONG TERM GOAL #3   Title  Patient will reduce worst pain to 6/10 to demonstrate reduced disability with ADLs and improved functional capacity.    Baseline  10/10 worst pain; 11/19 0/10; 12/21 5/10; 07/28/2019 2/10   pain 0/10 following cortisol injection   Time  8    Period  Weeks     Status  Achieved      PT LONG TERM GOAL #4   Title  Patient will report walking tolerance of 45 minutes for for return to cardiac walking program.    Baseline  walking tolerance 20  min; 11/19 20 min secondary to vertigo; 12/21 20 min; 07/28/2019 cannot assess    Time  8    Period  Weeks    Status  Unable to assess      PT LONG TERM GOAL #5   Title  Patient will report no vertigo with provoking motions or positions in order to be able to perform ADLs and mobility tasks without symptoms.    Baseline  8/10 dizziness with provoking motions; 11/19 8/10    Time  4    Period  Weeks    Status  Achieved      PT LONG TERM GOAL #6   Title  Patient will reduce worst pain to 0/10 with gait on stairs for reduced disability with gait.    Baseline  07/28/2019 2/10 on stairs    Time  6    Period  Weeks    Status  New      PT LONG TERM GOAL #7   Title  Patient will perform leg press on RLE alone with 40# for 10 reps to demonstrate partity with LLE and prevent long-term increased stress on LLE.    Baseline  RLE 20# x 10 reps    Time  6    Period  Weeks    Status  New            Plan - 08/16/19 1212    Clinical Impression Statement  Pt demonstrated pain w/ WB activities as evidenced by inability to tolerate STS and walking, most likely due to weakness in quads. Patient shows no pain w/ therex in non-WB exercises and can flex the knee through a fuller range of motion, indicating that her pain orginates from increased pressure on the tibiofemoral joint articulation. Patient would benefit from low functional, non-WB activity to help increase her strength and endurance in the quads. Patient would benefit from continue skilled therapy to return her to PLOF.    Personal Factors and Comorbidities  Age;Comorbidity 3+;Fitness    Comorbidities  CAD, vertigo, HTN, L shoulder pain/weakness    Examination-Activity Limitations  Bathing;Locomotion Level;Bend;Squat;Stairs;Stand;Dressing     Examination-Participation Restrictions  Cleaning;Community Activity;Other   Exercise   Stability/Clinical Decision Making  Evolving/Moderate complexity    Rehab Potential  Good    PT Frequency  2x / week    PT Duration  8 weeks    PT Treatment/Interventions  ADLs/Self Care Home Management;Aquatic Therapy;Cryotherapy;Electrical Stimulation;Moist Heat;DME Instruction;Gait training;Stair training;Functional mobility training;Therapeutic activities;Therapeutic exercise;Balance training;Neuromuscular re-education;Patient/family education;Manual techniques;Passive range of motion;Dry needling;Energy conservation;Taping;Joint Manipulations;Canalith Repostioning    PT Next Visit Plan  Quad therex against band, leg press; hip extension therex; STS; hip kickers    PT Home Exercise Plan  LAQ against green theraband, standing hip abd    Consulted and Agree with Plan of Care  Patient       Patient will benefit from skilled therapeutic intervention in order to improve the following deficits and impairments:  Abnormal gait, Decreased coordination, Decreased range of motion, Difficulty walking, Decreased endurance, Obesity, Decreased activity tolerance, Pain, Decreased balance, Hypomobility, Improper body mechanics, Impaired flexibility, Decreased mobility, Decreased strength, Increased edema, Postural dysfunction  Visit Diagnosis: Primary osteoarthritis of right knee  Other abnormalities of gait and mobility  Dizziness and giddiness     Problem List Patient Active Problem List   Diagnosis Date Noted  . Thrombocytopenia (HCC) 04/08/2019  . HTN (hypertension) 07/05/2018  . Tobacco use 07/05/2018  . S/P drug eluting coronary stent placement 03/15/2018  . Unstable angina (  HCC) 02/24/2018  . NSTEMI (non-ST elevated myocardial infarction) (HCC) 02/24/2018  . Full thickness rotator cuff tear 07/31/2017    Harmon Pier, SPT  08/16/2019, 12:29 PM  [facility]  [facility] [address] [place], [address] Phone: [phone]   Fax:  [phone]  Name: Karen Potts MRN: [id number] Date of Birth: 04/06/1961

## 2019-08-18 ENCOUNTER — Other Ambulatory Visit: Payer: Self-pay

## 2019-08-18 ENCOUNTER — Encounter: Payer: Self-pay | Admitting: Internal Medicine

## 2019-08-18 ENCOUNTER — Ambulatory Visit: Payer: Medicaid Other

## 2019-08-19 ENCOUNTER — Other Ambulatory Visit: Payer: Self-pay

## 2019-08-19 ENCOUNTER — Inpatient Hospital Stay: Payer: Medicaid Other | Attending: Internal Medicine | Admitting: Internal Medicine

## 2019-08-19 ENCOUNTER — Inpatient Hospital Stay: Payer: Medicaid Other

## 2019-08-19 DIAGNOSIS — M25569 Pain in unspecified knee: Secondary | ICD-10-CM | POA: Insufficient documentation

## 2019-08-19 DIAGNOSIS — Z90710 Acquired absence of both cervix and uterus: Secondary | ICD-10-CM | POA: Diagnosis not present

## 2019-08-19 DIAGNOSIS — D696 Thrombocytopenia, unspecified: Secondary | ICD-10-CM | POA: Diagnosis present

## 2019-08-19 DIAGNOSIS — Z87891 Personal history of nicotine dependence: Secondary | ICD-10-CM | POA: Insufficient documentation

## 2019-08-19 DIAGNOSIS — Z79899 Other long term (current) drug therapy: Secondary | ICD-10-CM | POA: Diagnosis not present

## 2019-08-19 DIAGNOSIS — E119 Type 2 diabetes mellitus without complications: Secondary | ICD-10-CM | POA: Diagnosis not present

## 2019-08-19 DIAGNOSIS — Z7982 Long term (current) use of aspirin: Secondary | ICD-10-CM | POA: Diagnosis not present

## 2019-08-19 DIAGNOSIS — Z7984 Long term (current) use of oral hypoglycemic drugs: Secondary | ICD-10-CM | POA: Insufficient documentation

## 2019-08-19 DIAGNOSIS — I2510 Atherosclerotic heart disease of native coronary artery without angina pectoris: Secondary | ICD-10-CM | POA: Insufficient documentation

## 2019-08-19 DIAGNOSIS — I10 Essential (primary) hypertension: Secondary | ICD-10-CM | POA: Insufficient documentation

## 2019-08-19 DIAGNOSIS — Z791 Long term (current) use of non-steroidal anti-inflammatories (NSAID): Secondary | ICD-10-CM | POA: Insufficient documentation

## 2019-08-19 DIAGNOSIS — G4730 Sleep apnea, unspecified: Secondary | ICD-10-CM | POA: Insufficient documentation

## 2019-08-19 LAB — CBC WITH DIFFERENTIAL/PLATELET
Abs Immature Granulocytes: 0.02 10*3/uL (ref 0.00–0.07)
Basophils Absolute: 0 10*3/uL (ref 0.0–0.1)
Basophils Relative: 1 %
Eosinophils Absolute: 0.3 10*3/uL (ref 0.0–0.5)
Eosinophils Relative: 4 %
HCT: 42.6 % (ref 36.0–46.0)
Hemoglobin: 13.4 g/dL (ref 12.0–15.0)
Immature Granulocytes: 0 %
Lymphocytes Relative: 48 %
Lymphs Abs: 3.7 10*3/uL (ref 0.7–4.0)
MCH: 27.7 pg (ref 26.0–34.0)
MCHC: 31.5 g/dL (ref 30.0–36.0)
MCV: 88.2 fL (ref 80.0–100.0)
Monocytes Absolute: 0.4 10*3/uL (ref 0.1–1.0)
Monocytes Relative: 5 %
Neutro Abs: 3.2 10*3/uL (ref 1.7–7.7)
Neutrophils Relative %: 42 %
Platelets: 101 10*3/uL — ABNORMAL LOW (ref 150–400)
RBC: 4.83 MIL/uL (ref 3.87–5.11)
RDW: 14.5 % (ref 11.5–15.5)
Smear Review: DECREASED
WBC: 7.7 10*3/uL (ref 4.0–10.5)
nRBC: 0 % (ref 0.0–0.2)

## 2019-08-19 LAB — COMPREHENSIVE METABOLIC PANEL
ALT: 35 U/L (ref 0–44)
AST: 41 U/L (ref 15–41)
Albumin: 3.8 g/dL (ref 3.5–5.0)
Alkaline Phosphatase: 53 U/L (ref 38–126)
Anion gap: 9 (ref 5–15)
BUN: 14 mg/dL (ref 6–20)
CO2: 27 mmol/L (ref 22–32)
Calcium: 9.2 mg/dL (ref 8.9–10.3)
Chloride: 102 mmol/L (ref 98–111)
Creatinine, Ser: 0.83 mg/dL (ref 0.44–1.00)
GFR calc Af Amer: >60 mL/min (ref >60)
GFR calc non Af Amer: >60 mL/min (ref >60)
Glucose, Bld: 196 mg/dL — ABNORMAL HIGH (ref 70–99)
Potassium: 3.9 mmol/L (ref 3.5–5.1)
Sodium: 138 mmol/L (ref 135–145)
Total Bilirubin: 0.6 mg/dL (ref 0.3–1.2)
Total Protein: 7.5 g/dL (ref 6.5–8.1)

## 2019-08-19 LAB — LACTATE DEHYDROGENASE: LDH: 127 U/L (ref 98–192)

## 2019-08-19 NOTE — Progress Notes (Signed)
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  female pleasant patient history of CAD on antiplatelet therapy and thrombocytopenia is here for follow-up.  In the interim patient has been taken off Effient by cardiology.  Currently on aspirin.  Patient's bruising is improved.  Patient has chronic knee pain/arthritis.  Recommended physical therapy by Dr. Odis Luster.    Review of Systems  Constitutional: Positive for malaise/fatigue. Negative for chills, diaphoresis, fever and weight loss.  HENT: Negative for nosebleeds and sore throat.   Eyes: Negative for double vision.  Respiratory: Positive for shortness of breath. Negative for cough, hemoptysis, sputum production and wheezing.   Cardiovascular: Negative for chest pain, palpitations, orthopnea and leg swelling.  Gastrointestinal: Negative for abdominal pain, blood in stool, constipation, diarrhea, heartburn, melena, nausea and vomiting.  Genitourinary: Negative for dysuria, frequency and urgency.  Musculoskeletal: Positive for back pain and joint pain.  Skin: Negative.  Negative for itching and rash.  Neurological: Negative for dizziness, tingling, focal weakness, weakness and headaches.  Endo/Heme/Allergies: Does not bruise/bleed easily.  Psychiatric/Behavioral: Negative for depression. The patient is not nervous/anxious and does not have insomnia.      MEDICAL HISTORY:  Past Medical History:  Diagnosis Date  . Diabetes mellitus without complication (HCC)   . Hypertension   .  Sleep apnea   . Vertigo     SURGICAL HISTORY: Past Surgical History:  Procedure Laterality Date  . ABDOMINAL HYSTERECTOMY    . BREAST BIOPSY Left 10/2017    APOCRINE TYPE CYST WITH FIBROSIS Of the Wall  . CORONARY STENT INTERVENTION N/A 02/24/2018   Procedure: CORONARY STENT INTERVENTION;  Surgeon: Marcina Millard, MD;  Location: [facility];  Service: Cardiovascular;  Laterality: N/A;  . LEFT HEART CATH AND CORONARY ANGIOGRAPHY Right 02/24/2018   Procedure: LEFT HEART CATH AND CORONARY ANGIOGRAPHY;  Surgeon: Marcina Millard, MD;  Location: [facility];  Service: Cardiovascular;  Laterality: Right;  . SHOULDER ARTHROSCOPY WITH OPEN ROTATOR CUFF REPAIR Left 08/14/2017   Procedure: SHOULDER ARTHROSCOPY WITH ROTATOR CUFF REPAIR, BICEPS TENOTOMY, SUBACROMIAL DECOMPRESSION;  Surgeon: Lyndle Herrlich, MD;  Location: [facility];  Service: Orthopedics;  Laterality: Left;  . UMBILICAL HERNIA REPAIR      SOCIAL HISTORY: Social History   Socioeconomic History  . Marital status: Married    Spouse name: Not on file  . Number of children: Not on file  . Years of education: Not on file  . Highest education level: Not on file  Occupational History  . Not on file  Tobacco Use  . Smoking status: Former Smoker    Types: Cigarettes    Quit date: 02/24/2018    Years since quitting: 1.4  . Smokeless tobacco: Never Used  . Tobacco comment: Marialy said she quit smoking the day of her heart attack.  Substance and Sexual Activity  . Alcohol use: No  . Drug use: No  . Sexual activity: Yes    Birth control/protection: Condom  Other Topics Concern  . Not on file  Social History Narrative   Not working/disability; smoking; no alcohol. In [place]/ lives with a friend.  Social Determinants of Health   Financial Resource Strain:   . Difficulty of Paying Living Expenses: Not on file  Food Insecurity:   . Worried About Charity fundraiser in the Last Year: Not on file  .  Ran Out of Food in the Last Year: Not on file  Transportation Needs:   . Lack of Transportation (Medical): Not on file  . Lack of Transportation (Non-Medical): Not on file  Physical Activity:   . Days of Exercise per Week: Not on file  . Minutes of Exercise per Session: Not on file  Stress:   . Feeling of Stress : Not on file  Social Connections:   . Frequency of Communication with Friends and Family: Not on file  . Frequency of Social Gatherings with Friends and Family: Not on file  . Attends Religious Services: Not on file  . Active Member of Clubs or Organizations: Not on file  . Attends Archivist Meetings: Not on file  . Marital Status: Not on file  Intimate Partner Violence:   . Fear of Current or Ex-Partner: Not on file  . Emotionally Abused: Not on file  . Physically Abused: Not on file  . Sexually Abused: Not on file    FAMILY HISTORY: Family History  Problem Relation Age of Onset  . Breast cancer Neg Hx     ALLERGIES:  has No Known Allergies.  MEDICATIONS:  Current Outpatient Medications  Medication Sig Dispense Refill  . aspirin EC 81 MG tablet Take 81 mg by mouth daily.    Marland Kitchen atorvastatin (LIPITOR) 80 MG tablet Take 1 tablet (80 mg total) by mouth daily at 6 PM. 30 tablet 0  . diazepam (VALIUM) 5 MG tablet Take 1 tablet (5 mg total) by mouth every 8 (eight) hours as needed (dizziness). 30 tablet 0  . diclofenac sodium (VOLTAREN) 1 % GEL Apply 2 g topically 4 (four) times daily as needed (pain in knee).    Marland Kitchen lisinopril-hydrochlorothiazide (PRINZIDE,ZESTORETIC) 10-12.5 MG tablet TAKE 1 TABLET BY MOUTH ONCE DAILY 90 tablet 1  . loratadine (CLARITIN) 10 MG tablet Take by mouth.    . meclizine (ANTIVERT) 12.5 MG tablet Take 1 tablet (12.5 mg total) by mouth 3 (three) times daily as needed for dizziness or nausea. 30 tablet 1  . meclizine (ANTIVERT) 25 MG tablet Take 1 tablet (25 mg total) by mouth 3 (three) times daily as needed for dizziness. 30 tablet 0  .  metFORMIN (GLUCOPHAGE) 500 MG tablet Take 500 mg by mouth daily with breakfast.     . nitroGLYCERIN (NITROSTAT) 0.4 MG SL tablet Place 1 tablet (0.4 mg total) under the tongue every 5 (five) minutes as needed for chest pain. (Patient not taking: Reported on 08/18/2019) 30 tablet 12   No current facility-administered medications for this visit.     Marland Kitchen  PHYSICAL EXAMINATION:   Vitals:   08/18/19 1200  BP: 132/67  Pulse: (!) 57  Temp: (!) 97.1 F (36.2 C)   Filed Weights   08/18/19 1200  Weight: 287 lb (130.2 kg)    Physical Exam  Constitutional: She is oriented to person, place, and time and well-developed, well-nourished, and in no distress.  HENT:  Head: Normocephalic and atraumatic.  Mouth/Throat: Oropharynx is clear and moist. No oropharyngeal exudate.  Eyes: Pupils are equal, round, and reactive to light.  Cardiovascular: Normal rate and regular rhythm.  Pulmonary/Chest: No respiratory distress. She has no wheezes.  Abdominal: Soft. Bowel sounds are normal. She exhibits  no distension and no mass. There is no abdominal tenderness. There is no rebound and no guarding.  Musculoskeletal:        General: No tenderness or edema. Normal range of motion.     Cervical back: Normal range of motion and neck supple.  Neurological: She is alert and oriented to person, place, and time.  Skin: Skin is warm.  Psychiatric: Affect normal.     LABORATORY DATA:  I have reviewed the data as listed Lab Results  Component Value Date   WBC 7.7 08/19/2019   HGB 13.4 08/19/2019   HCT 42.6 08/19/2019   MCV 88.2 08/19/2019   PLT 101 (L) 08/19/2019   Recent Labs    09/11/18 0509 08/19/19 1038  NA 137 138  K 3.6 3.9  CL 103 102  CO2 23 27  GLUCOSE 165* 196*  BUN 21* 14  CREATININE 0.76 0.83  CALCIUM 9.2 9.2  GFRNONAA >60 >60  GFRAA >60 >60  PROT 7.9 7.5  ALBUMIN 4.0 3.8  AST 48* 41  ALT 35 35  ALKPHOS 53 53  BILITOT 0.6 0.6     No results found.  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  Platelets today are 101 overall stable.  Discussed that this should not cause any symptoms.  Discussed that we will consider treatments/if platelets less than 50 or bleeding or worsening bruising.  #Easy bruising-improved since coming off Effient. Continue aspirin.  # knee surgery [s/p cortisone shot; Dr.Bowers]; patient should be okay from hematology standpoint to proceed with surgery as on the platelets of 100,000.  # CAD/MI- [Aug 2019]- asprin; Effient Jocundus.Bianchi ] [Dr.Parschoes/].   #Smoker : 2 cig/day- not a candidate for LCSP.  Counseled to quit smoking.  # DISPOSITION: # follow up in 6 month-MD: labs- cbc/cmp;LDH- Dr.B  All questions were answered. The patient knows to call the clinic with any problems, questions or concerns.  Thank you Dr. for allowing me to participate in the care of your pleasant patient. Please do not hesitate to contact me with questions or concerns in the interim.   Earna Coder, MD 08/19/2019 12:36 PM  # 45 minutes face-to-face with the patient discussing the above plan of care; more than 50% of time spent on counseling and coordination.

## 2019-08-19 NOTE — Assessment & Plan Note (Addendum)
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  Platelets today are 101 overall stable.  Discussed that this should not cause any symptoms.  Discussed that we will consider treatments/if platelets less than 50 or bleeding or worsening bruising.  #Easy bruising-improved since coming off Effient. Continue aspirin.  # knee surgery [s/p cortisone shot; Dr.Bowers]; patient should be okay from hematology standpoint to proceed with surgery as on the platelets of 100,000.  # CAD/MI- [Aug 2019]- asprin; Effient Jocundus.Bianchi ] [Dr.Parschoes/].   #Smoker : 2 cig/day- not a candidate for LCSP.  Counseled to quit smoking.  # DISPOSITION: # follow up in 6 month-MD: labs- cbc/cmp;LDH- Dr.B

## 2019-08-23 ENCOUNTER — Ambulatory Visit: Payer: Medicaid Other

## 2019-08-23 ENCOUNTER — Other Ambulatory Visit: Payer: Self-pay

## 2019-08-23 DIAGNOSIS — M1711 Unilateral primary osteoarthritis, right knee: Secondary | ICD-10-CM

## 2019-08-23 DIAGNOSIS — R42 Dizziness and giddiness: Secondary | ICD-10-CM

## 2019-08-23 DIAGNOSIS — M25551 Pain in right hip: Secondary | ICD-10-CM

## 2019-08-23 NOTE — Therapy (Signed)
[facility] [address], [place], [address] Phone: [phone]   Fax:  [phone]  Physical Therapy Treatment  Patient Details  Name: Karen Potts MRN: [id number] Date of Birth: 1960/10/18 Referring Provider (PT): Gennette Pac MD   Encounter Date: 08/23/2019  PT End of Session - 08/23/19 1127    Visit Number  22    Number of Visits  30    Date for PT Re-Evaluation  09/01/19    Authorization Type  21/30; MEDICAIDE: 07/26/2019 - 09/05/2019 5/12    PT Start Time  1115    PT Stop Time  1200    PT Time Calculation (min)  45 min    Equipment Utilized During Treatment  Gait belt    Activity Tolerance  Patient tolerated treatment well   Treatment limited secondary to vertigo   Behavior During Therapy  [facility] for tasks assessed/performed       Past Medical History:  Diagnosis Date  . Diabetes mellitus without complication ([place])   . Hypertension   . Sleep apnea   . Vertigo     Past Surgical History:  Procedure Laterality Date  . ABDOMINAL HYSTERECTOMY    . BREAST BIOPSY Left 10/2017    APOCRINE TYPE CYST WITH FIBROSIS Of the Wall  . CORONARY STENT INTERVENTION N/A 02/24/2018   Procedure: CORONARY STENT INTERVENTION;  Surgeon: Isaias Cowman, MD;  Location: [facility];  Service: Cardiovascular;  Laterality: N/A;  . LEFT HEART CATH AND CORONARY ANGIOGRAPHY Right 02/24/2018   Procedure: LEFT HEART CATH AND CORONARY ANGIOGRAPHY;  Surgeon: Isaias Cowman, MD;  Location: [facility];  Service: Cardiovascular;  Laterality: Right;  . SHOULDER ARTHROSCOPY WITH OPEN ROTATOR CUFF REPAIR Left 08/14/2017   Procedure: SHOULDER ARTHROSCOPY WITH ROTATOR CUFF REPAIR, BICEPS TENOTOMY, SUBACROMIAL DECOMPRESSION;  Surgeon: Lovell Sheehan, MD;  Location: [facility];  Service: Orthopedics;  Laterality: Left;  . UMBILICAL HERNIA REPAIR      There were no vitals filed for this visit.  Subjective  Assessment - 08/23/19 1118    Subjective  Patient reports some pain over the weekend but has actually been feeling good today and no pain    Pertinent History  Onset [name] 2020 during treadmill training as part of cardiac rehabilitation. Cortisone shots provide relief for "a couple months"; last shot in June, relief until September. Attempting to increase exercise for weight loss and cardiac health but currently able to walk about "one block" before requiring rest due to knee pain. Recently purchased [facility] for assistance with ambulation. Pain 5/10, 10/10 worst, 0/10 best, with pain in R hip and LBP with increased activity. Agg: walking, standing. Ease: Voltaren, Aspirin, rest. Does not bother her in non-WB. Comorbidities: CAD, HTN, smoker, occasional vertigo "comes and goes every few months", DM2; L shoulder rotator cuff repair with fair recovery.    Limitations  Walking;Standing    How long can you sit comfortably?  unlimited    How long can you stand comfortably?  5 min    How long can you walk comfortably?  20 min    Diagnostic tests  x-ray (per pt report; the cartilage was "gone" on B knees)    Patient Stated Goals  walk with less pain to lose weight    Currently in Pain?  No/denies       TREATMENT  TA  WT shifts in frontal plane 2 x 15  WT shifts in sagittal plane 2 x 15  only RLE forward TKE's - 2 x 10 not to full extension w/ GTB  TE AROM on wt ball 3 x 30 sec SL Leg Press 3 x 10 at [facility] 20#, 25#  Gait Training Gait w/ cane in contralateral UE (shifting cane from RUE to LUE to help off load the weight on the painful knee) 6 x 20 ft     Therapeutic activities utilized to improve functional capacity for strength w/ walking. Therapeutic exercises performed to increase quad strength to decrease compressive forces on the knee Gait training conducted to improve RLE pain by off setting patient WT w/ the cane in the contralateral UE      PT Education - 08/23/19 1126    Education Details   form/technique w/ exercise    Person(s) Educated  Patient    Methods  Explanation;Demonstration;Tactile cues;Verbal cues    Comprehension  Verbalized understanding;Returned demonstration;Verbal cues required;Tactile cues required       PT Short Term Goals - 08/04/19 1416      PT SHORT TERM GOAL #1   Title  Patient will be independent with HEP as adjunct to clinical therapy and to reduce number of visits.    Baseline  HEP given    Time  2    Period  Weeks    Status  Achieved    Target Date  05/18/19      PT SHORT TERM GOAL #2   Title  Patient will demonstrate ability to perform correct gait pattern with SPC 100% of the time for improved energy conservation and functional capacity with ambulation and ADLs.    Baseline  Correct gait pattern 50% of the time    Time  2    Period  Weeks    Status  Achieved    Target Date  05/18/19      PT SHORT TERM GOAL #3   Title  Patient will report 50% or greater improvement in her symptoms of dizziness and imbalance with provoking motions/positions    Baseline  Vertigo 8/10; 08/04/2019: 90% improvement    Time  2    Period  Weeks    Status  Achieved    Target Date  05/30/19        PT Long Term Goals - 08/04/19 1418      PT LONG TERM GOAL #1   Title  Patient will improve speed to 0.8 m/sec to achieve community ambulator status and reduce fall risks.    Baseline  0.69 m/sec with SPC; 11/19 0.74; 12/21 deferred; 07/28/2019 0.878    Time  8    Period  Weeks    Status  Achieved      PT LONG TERM GOAL #2   Title  Patient will improve LEFS score by 9 points to achieve MCID for reduced disability and improved function.    Baseline  LEFS 25; 11/19 could not assess; 12/21 deferred for time; 07/28/2019 deferred for time    Time  8    Period  Weeks    Status  Deferred      PT LONG TERM GOAL #3   Title  Patient will reduce worst pain to 6/10 to demonstrate reduced disability with ADLs and improved functional capacity.    Baseline  10/10  worst pain; 11/19 0/10; 12/21 5/10; 07/28/2019 2/10   pain 0/10 following cortisol injection   Time  8    Period  Weeks    Status  Achieved      PT LONG  TERM GOAL #4   Title  Patient will report walking tolerance of 45 minutes for for return to cardiac walking program.    Baseline  walking tolerance 20 min; 11/19 20 min secondary to vertigo; 12/21 20 min; 07/28/2019 cannot assess    Time  8    Period  Weeks    Status  Unable to assess      PT LONG TERM GOAL #5   Title  Patient will report no vertigo with provoking motions or positions in order to be able to perform ADLs and mobility tasks without symptoms.    Baseline  8/10 dizziness with provoking motions; 11/19 8/10    Time  4    Period  Weeks    Status  Achieved      PT LONG TERM GOAL #6   Title  Patient will reduce worst pain to 0/10 with gait on stairs for reduced disability with gait.    Baseline  07/28/2019 2/10 on stairs    Time  6    Period  Weeks    Status  New      PT LONG TERM GOAL #7   Title  Patient will perform leg press on RLE alone with 40# for 10 reps to demonstrate partity with LLE and prevent long-term increased stress on LLE.    Baseline  RLE 20# x 10 reps    Time  6    Period  Weeks    Status  New            Plan - 08/23/19 1204    Clinical Impression Statement  Patient demonstrated increased tolerance for WB activities today and was able to load knee joint in a near SLS indicating increased strength in the quads. Patient showed decreased ability to tolerate full knee extension indicating decreased joint space at the tibiofemoral joint. Patient would continue to benefit from low functional activities to strength the hips and knees to remove increased load on the knee.    Personal Factors and Comorbidities  Age;Comorbidity 3+;Fitness    Comorbidities  CAD, vertigo, HTN, L shoulder pain/weakness    Examination-Activity Limitations  Bathing;Locomotion Level;Bend;Squat;Stairs;Stand;Dressing     Examination-Participation Restrictions  Cleaning;Community Activity;Other   Exercise   Stability/Clinical Decision Making  Evolving/Moderate complexity    Rehab Potential  Good    PT Frequency  2x / week    PT Duration  8 weeks    PT Treatment/Interventions  ADLs/Self Care Home Management;Aquatic Therapy;Cryotherapy;Electrical Stimulation;Moist Heat;DME Instruction;Gait training;Stair training;Functional mobility training;Therapeutic activities;Therapeutic exercise;Balance training;Neuromuscular re-education;Patient/family education;Manual techniques;Passive range of motion;Dry needling;Energy conservation;Taping;Joint Manipulations;Canalith Repostioning    PT Next Visit Plan  Quad therex against band, leg press; hip extension therex; STS; hip kickers    PT Home Exercise Plan  LAQ against green theraband, standing hip abd    Consulted and Agree with Plan of Care  Patient       Patient will benefit from skilled therapeutic intervention in order to improve the following deficits and impairments:  Abnormal gait, Decreased coordination, Decreased range of motion, Difficulty walking, Decreased endurance, Obesity, Decreased activity tolerance, Pain, Decreased balance, Hypomobility, Improper body mechanics, Impaired flexibility, Decreased mobility, Decreased strength, Increased edema, Postural dysfunction  Visit Diagnosis: Primary osteoarthritis of right knee  Dizziness and giddiness  Pain in right hip     Problem List Patient Active Problem List   Diagnosis Date Noted  . Thrombocytopenia (HCC) 04/08/2019  . HTN (hypertension) 07/05/2018  . Tobacco use 07/05/2018  . S/P drug eluting coronary  stent placement 03/15/2018  . Unstable angina (HCC) 02/24/2018  . NSTEMI (non-ST elevated myocardial infarction) (HCC) 02/24/2018  . Full thickness rotator cuff tear 07/31/2017    Harmon Pier, SPT  08/23/2019, 12:15 PM  [facility]  [facility] [address], [place], [address] Phone: [phone]   Fax:  [phone]  Name: KYILEE GREGG MRN: [id number] Date of Birth: 03/11/1961

## 2019-08-25 ENCOUNTER — Other Ambulatory Visit: Payer: Self-pay

## 2019-08-25 ENCOUNTER — Ambulatory Visit: Payer: Medicaid Other

## 2019-08-25 DIAGNOSIS — M25551 Pain in right hip: Secondary | ICD-10-CM

## 2019-08-25 DIAGNOSIS — M1711 Unilateral primary osteoarthritis, right knee: Secondary | ICD-10-CM

## 2019-08-25 NOTE — Therapy (Signed)
[facility] [address], [place], [address] Phone: [phone]   Fax:  [phone]  Physical Therapy Treatment  Patient Details  Name: Karen Potts MRN: [id number] Date of Birth: [birth year] Referring Provider (PT): Toy Cookey MD   Encounter Date: 08/25/2019  PT End of Session - 08/25/19 1121    Visit Number  23    Number of Visits  30    Date for PT Re-Evaluation  09/01/19    Authorization Type  21/30; MEDICAIDE: 07/26/2019 - 09/05/2019 7/12    PT Start Time  1115    PT Stop Time  1200    PT Time Calculation (min)  45 min    Equipment Utilized During Treatment  Gait belt    Activity Tolerance  Patient tolerated treatment well   Treatment limited secondary to vertigo   Behavior During Therapy  [facility] for tasks assessed/performed       Past Medical History:  Diagnosis Date  . Diabetes mellitus without complication (HCC)   . Hypertension   . Sleep apnea   . Vertigo     Past Surgical History:  Procedure Laterality Date  . ABDOMINAL HYSTERECTOMY    . BREAST BIOPSY Left 10/2017    APOCRINE TYPE CYST WITH FIBROSIS Of the Wall  . CORONARY STENT INTERVENTION N/A 02/24/2018   Procedure: CORONARY STENT INTERVENTION;  Surgeon: Marcina Millard, MD;  Location: [facility];  Service: Cardiovascular;  Laterality: N/A;  . LEFT HEART CATH AND CORONARY ANGIOGRAPHY Right 02/24/2018   Procedure: LEFT HEART CATH AND CORONARY ANGIOGRAPHY;  Surgeon: Marcina Millard, MD;  Location: [facility];  Service: Cardiovascular;  Laterality: Right;  . SHOULDER ARTHROSCOPY WITH OPEN ROTATOR CUFF REPAIR Left 08/14/2017   Procedure: SHOULDER ARTHROSCOPY WITH ROTATOR CUFF REPAIR, BICEPS TENOTOMY, SUBACROMIAL DECOMPRESSION;  Surgeon: Lyndle Herrlich, MD;  Location: [facility];  Service: Orthopedics;  Laterality: Left;  . UMBILICAL HERNIA REPAIR      There were no vitals filed for this visit.  Subjective  Assessment - 08/25/19 1117    Subjective  Patient reports some soreness today after walking up a few steps heading up the court house.    Pertinent History  Onset [name] 2020 during treadmill training as part of cardiac rehabilitation. Cortisone shots provide relief for "a couple months"; last shot in June, relief until September. Attempting to increase exercise for weight loss and cardiac health but currently able to walk about "one block" before requiring rest due to knee pain. Recently purchased [facility] for assistance with ambulation. Pain 5/10, 10/10 worst, 0/10 best, with pain in R hip and LBP with increased activity. Agg: walking, standing. Ease: Voltaren, Aspirin, rest. Does not bother her in non-WB. Comorbidities: CAD, HTN, smoker, occasional vertigo "comes and goes every few months", DM2; L shoulder rotator cuff repair with fair recovery.    Limitations  Walking;Standing    How long can you sit comfortably?  unlimited    How long can you stand comfortably?  5 min    How long can you walk comfortably?  20 min    Diagnostic tests  x-ray (per pt report; the cartilage was "gone" on B knees)    Patient Stated Goals  walk with less pain to lose weight    Currently in Pain?  Yes    Pain Score  7     Pain Location  Knee    Pain Orientation  Right    Pain Descriptors /  Indicators  Aching    Pain Type  Chronic pain        TREATMENT  TE WT shifts in frontal plane  x 15  AROM on wt ball 3 x 30 sec SL Leg Press 3 x 10 at [facility] 20#, 25# Gait w/ cane in contralateral UE (shifting cane from RUE to LUE to help off load the weight on the painful knee) 6 x 20 ft  Therapeutic Exercise utilized to improve functional capacity for strength w/ walking. Therapeutic exercises performed to increase quad strength to decrease compressive forces on the knee Manual therapy STM performed to the distal quad to decreased increased spasms and pain with patient positioned in sitting -- x 20 min   PT Education - 08/25/19  1120    Education Details  form/technique with exercise    Person(s) Educated  Patient    Methods  Explanation;Demonstration;Tactile cues;Verbal cues    Comprehension  Verbalized understanding;Returned demonstration;Verbal cues required;Tactile cues required       PT Short Term Goals - 08/04/19 1416      PT SHORT TERM GOAL #1   Title  Patient will be independent with HEP as adjunct to clinical therapy and to reduce number of visits.    Baseline  HEP given    Time  2    Period  Weeks    Status  Achieved    Target Date  05/18/19      PT SHORT TERM GOAL #2   Title  Patient will demonstrate ability to perform correct gait pattern with SPC 100% of the time for improved energy conservation and functional capacity with ambulation and ADLs.    Baseline  Correct gait pattern 50% of the time    Time  2    Period  Weeks    Status  Achieved    Target Date  05/18/19      PT SHORT TERM GOAL #3   Title  Patient will report 50% or greater improvement in her symptoms of dizziness and imbalance with provoking motions/positions    Baseline  Vertigo 8/10; 08/04/2019: 90% improvement    Time  2    Period  Weeks    Status  Achieved    Target Date  05/30/19        PT Long Term Goals - 08/04/19 1418      PT LONG TERM GOAL #1   Title  Patient will improve speed to 0.8 m/sec to achieve community ambulator status and reduce fall risks.    Baseline  0.69 m/sec with SPC; 11/19 0.74; 12/21 deferred; 07/28/2019 0.878    Time  8    Period  Weeks    Status  Achieved      PT LONG TERM GOAL #2   Title  Patient will improve LEFS score by 9 points to achieve MCID for reduced disability and improved function.    Baseline  LEFS 25; 11/19 could not assess; 12/21 deferred for time; 07/28/2019 deferred for time    Time  8    Period  Weeks    Status  Deferred      PT LONG TERM GOAL #3   Title  Patient will reduce worst pain to 6/10 to demonstrate reduced disability with ADLs and improved functional  capacity.    Baseline  10/10 worst pain; 11/19 0/10; 12/21 5/10; 07/28/2019 2/10   pain 0/10 following cortisol injection   Time  8    Period  Weeks    Status  Achieved      PT LONG TERM GOAL #4   Title  Patient will report walking tolerance of 45 minutes for for return to cardiac walking program.    Baseline  walking tolerance 20 min; 11/19 20 min secondary to vertigo; 12/21 20 min; 07/28/2019 cannot assess    Time  8    Period  Weeks    Status  Unable to assess      PT LONG TERM GOAL #5   Title  Patient will report no vertigo with provoking motions or positions in order to be able to perform ADLs and mobility tasks without symptoms.    Baseline  8/10 dizziness with provoking motions; 11/19 8/10    Time  4    Period  Weeks    Status  Achieved      PT LONG TERM GOAL #6   Title  Patient will reduce worst pain to 0/10 with gait on stairs for reduced disability with gait.    Baseline  07/28/2019 2/10 on stairs    Time  6    Period  Weeks    Status  New      PT LONG TERM GOAL #7   Title  Patient will perform leg press on RLE alone with 40# for 10 reps to demonstrate partity with LLE and prevent long-term increased stress on LLE.    Baseline  RLE 20# x 10 reps    Time  6    Period  Weeks    Status  New            Plan - 08/25/19 1714    Clinical Impression Statement  Patient demonstrates increased pain at baseline today demonstrating increased difficulty with Leg press, standing for long periods of time, and has difficulty with LAQ, this is most likely secondary to increased stair climbing the day before. Patient will benefit from further skilled therapy to return to prior level of function.    Personal Factors and Comorbidities  Age;Comorbidity 3+;Fitness    Comorbidities  CAD, vertigo, HTN, L shoulder pain/weakness    Examination-Activity Limitations  Bathing;Locomotion Level;Bend;Squat;Stairs;Stand;Dressing    Examination-Participation Restrictions  Cleaning;Community  Activity;Other   Exercise   Stability/Clinical Decision Making  Evolving/Moderate complexity    Rehab Potential  Good    PT Frequency  2x / week    PT Duration  8 weeks    PT Treatment/Interventions  ADLs/Self Care Home Management;Aquatic Therapy;Cryotherapy;Electrical Stimulation;Moist Heat;DME Instruction;Gait training;Stair training;Functional mobility training;Therapeutic activities;Therapeutic exercise;Balance training;Neuromuscular re-education;Patient/family education;Manual techniques;Passive range of motion;Dry needling;Energy conservation;Taping;Joint Manipulations;Canalith Repostioning    PT Next Visit Plan  Quad therex against band, leg press; hip extension therex; STS; hip kickers    PT Home Exercise Plan  LAQ against green theraband, standing hip abd    Consulted and Agree with Plan of Care  Patient       Patient will benefit from skilled therapeutic intervention in order to improve the following deficits and impairments:  Abnormal gait, Decreased coordination, Decreased range of motion, Difficulty walking, Decreased endurance, Obesity, Decreased activity tolerance, Pain, Decreased balance, Hypomobility, Improper body mechanics, Impaired flexibility, Decreased mobility, Decreased strength, Increased edema, Postural dysfunction  Visit Diagnosis: Primary osteoarthritis of right knee  Pain in right hip     Problem List Patient Active Problem List   Diagnosis Date Noted  . Thrombocytopenia ([place]) 04/08/2019  . HTN (hypertension) 07/05/2018  . Tobacco use 07/05/2018  . S/P drug eluting coronary stent placement 03/15/2018  . Unstable angina ([place]) 02/24/2018  .  NSTEMI (non-ST elevated myocardial infarction) (HCC) 02/24/2018  . Full thickness rotator cuff tear 07/31/2017    Myrene Galas, PT DPT 08/25/2019, 5:18 PM  [facility] SPORTS MEDICINE [address] [place], [address] Phone: [phone]   Fax:   [phone]  Name: Karen Potts MRN: [id number] Date of Birth: 11/18/1960

## 2019-08-30 ENCOUNTER — Other Ambulatory Visit: Payer: Self-pay

## 2019-08-30 ENCOUNTER — Ambulatory Visit: Payer: Medicaid Other | Attending: Family Medicine

## 2019-08-30 DIAGNOSIS — M1711 Unilateral primary osteoarthritis, right knee: Secondary | ICD-10-CM | POA: Insufficient documentation

## 2019-08-30 DIAGNOSIS — M25561 Pain in right knee: Secondary | ICD-10-CM | POA: Diagnosis present

## 2019-08-30 DIAGNOSIS — G8929 Other chronic pain: Secondary | ICD-10-CM | POA: Insufficient documentation

## 2019-08-30 DIAGNOSIS — M25551 Pain in right hip: Secondary | ICD-10-CM | POA: Diagnosis not present

## 2019-08-30 NOTE — Therapy (Signed)
[facility] MEDICINE [address] [place], [address] Phone: [phone]   Fax:  [phone]  Physical Therapy Treatment  Patient Details  Name: Karen Potts MRN: [id number] Date of Birth: Dec 28, 1960 Referring Provider (PT): Toy Cookey MD   Encounter Date: 08/30/2019  PT End of Session - 08/30/19 1123    Visit Number  24    Number of Visits  30    Date for PT Re-Evaluation  09/01/19    Authorization Type  21/30; MEDICAIDE: 07/26/2019 - 09/05/2019 8/12    PT Start Time  1118    PT Stop Time  1200    PT Time Calculation (min)  42 min    Equipment Utilized During Treatment  Gait belt    Activity Tolerance  Patient tolerated treatment well   Treatment limited secondary to vertigo   Behavior During Therapy  [facility] for tasks assessed/performed       Past Medical History:  Diagnosis Date  . Diabetes mellitus without complication (HCC)   . Hypertension   . Sleep apnea   . Vertigo     Past Surgical History:  Procedure Laterality Date  . ABDOMINAL HYSTERECTOMY    . BREAST BIOPSY Left 10/2017    APOCRINE TYPE CYST WITH FIBROSIS Of the Wall  . CORONARY STENT INTERVENTION N/A 02/24/2018   Procedure: CORONARY STENT INTERVENTION;  Surgeon: Marcina Millard, MD;  Location: [facility] INVASIVE CV LAB;  Service: Cardiovascular;  Laterality: N/A;  . LEFT HEART CATH AND CORONARY ANGIOGRAPHY Right 02/24/2018   Procedure: LEFT HEART CATH AND CORONARY ANGIOGRAPHY;  Surgeon: Marcina Millard, MD;  Location: [facility] INVASIVE CV LAB;  Service: Cardiovascular;  Laterality: Right;  . SHOULDER ARTHROSCOPY WITH OPEN ROTATOR CUFF REPAIR Left 08/14/2017   Procedure: SHOULDER ARTHROSCOPY WITH ROTATOR CUFF REPAIR, BICEPS TENOTOMY, SUBACROMIAL DECOMPRESSION;  Surgeon: Lyndle Herrlich, MD;  Location: [facility] ORS;  Service: Orthopedics;  Laterality: Left;  . UMBILICAL HERNIA REPAIR      There were no vitals filed for this visit.  Subjective  Assessment - 08/30/19 1118    Subjective  Patient reports her knee is feeling sore today. Patient states her knee get tired and feels increased difficulty with walking.    Pertinent History  Onset [name] 2020 during treadmill training as part of cardiac rehabilitation. Cortisone shots provide relief for "a couple months"; last shot in June, relief until September. Attempting to increase exercise for weight loss and cardiac health but currently able to walk about "one block" before requiring rest due to knee pain. Recently purchased [facility] for assistance with ambulation. Pain 5/10, 10/10 worst, 0/10 best, with pain in R hip and LBP with increased activity. Agg: walking, standing. Ease: Voltaren, Aspirin, rest. Does not bother her in non-WB. Comorbidities: CAD, HTN, smoker, occasional vertigo "comes and goes every few months", DM2; L shoulder rotator cuff repair with fair recovery.    Limitations  Walking;Standing    How long can you sit comfortably?  unlimited    How long can you stand comfortably?  5 min    How long can you walk comfortably?  20 min    Diagnostic tests  x-ray (per pt report; the cartilage was "gone" on B knees)    Patient Stated Goals  walk with less pain to lose weight    Currently in Pain?  Yes    Pain Score  7     Pain Location  Knee    Pain Orientation  Right  Pain Descriptors / Indicators  Aching    Pain Type  Chronic pain    Pain Onset  More than a month ago    Pain Frequency  Intermittent       TREATMENT   TE WT shifts in frontal plane  x 15  AROM on wt ball 6 x 30 sec SL Leg Press - against manual resistance from therapist - x 10 SLR in sitting in sitting - 4 x 3  Gait w/ cane in contralateral UE (shifting cane from RUE to LUE to help off load the weight on the painful knee) 6 x 20 ft  Seated knee flexion with GTB - x 10   Therapeutic Exercise utilized to improve functional capacity for strength w/ walking. Therapeutic exercises performed to increase quad strength to  decrease compressive forces on the knee  Manual therapy STM performed to the distal quad to decreased increased spasms and pain with patient positioned in sitting -- x 8 min    PT Education - 08/30/19 1122    Education Details  form/technique withe exercise    Person(s) Educated  Patient    Methods  Explanation;Demonstration    Comprehension  Returned demonstration;Verbalized understanding       PT Short Term Goals - 08/04/19 1416      PT SHORT TERM GOAL #1   Title  Patient will be independent with HEP as adjunct to clinical therapy and to reduce number of visits.    Baseline  HEP given    Time  2    Period  Weeks    Status  Achieved    Target Date  05/18/19      PT SHORT TERM GOAL #2   Title  Patient will demonstrate ability to perform correct gait pattern with SPC 100% of the time for improved energy conservation and functional capacity with ambulation and ADLs.    Baseline  Correct gait pattern 50% of the time    Time  2    Period  Weeks    Status  Achieved    Target Date  05/18/19      PT SHORT TERM GOAL #3   Title  Patient will report 50% or greater improvement in her symptoms of dizziness and imbalance with provoking motions/positions    Baseline  Vertigo 8/10; 08/04/2019: 90% improvement    Time  2    Period  Weeks    Status  Achieved    Target Date  05/30/19        PT Long Term Goals - 08/04/19 1418      PT LONG TERM GOAL #1   Title  Patient will improve 10MW speed to 0.8 m/sec to achieve community ambulator status and reduce fall risks.    Baseline  0.69 m/sec with SPC; 11/19 0.74; 12/21 deferred; 07/28/2019 0.878    Time  8    Period  Weeks    Status  Achieved      PT LONG TERM GOAL #2   Title  Patient will improve LEFS score by 9 points to achieve MCID for reduced disability and improved function.    Baseline  LEFS 25; 11/19 could not assess; 12/21 deferred for time; 07/28/2019 deferred for time    Time  8    Period  Weeks    Status  Deferred      PT  LONG TERM GOAL #3   Title  Patient will reduce worst pain to 6/10 to demonstrate reduced disability with ADLs and  improved functional capacity.    Baseline  10/10 worst pain; 11/19 0/10; 12/21 5/10; 07/28/2019 2/10   pain 0/10 following cortisol injection   Time  8    Period  Weeks    Status  Achieved      PT LONG TERM GOAL #4   Title  Patient will report walking tolerance of 45 minutes for for return to cardiac walking program.    Baseline  walking tolerance 20 min; 11/19 20 min secondary to vertigo; 12/21 20 min; 07/28/2019 cannot assess    Time  8    Period  Weeks    Status  Unable to assess      PT LONG TERM GOAL #5   Title  Patient will report no vertigo with provoking motions or positions in order to be able to perform ADLs and mobility tasks without symptoms.    Baseline  8/10 dizziness with provoking motions; 11/19 8/10    Time  4    Period  Weeks    Status  Achieved      PT LONG TERM GOAL #6   Title  Patient will reduce worst pain to 0/10 with gait on stairs for reduced disability with gait.    Baseline  07/28/2019 2/10 on stairs    Time  6    Period  Weeks    Status  New      PT LONG TERM GOAL #7   Title  Patient will perform leg press on RLE alone with 40# for 10 reps to demonstrate partity with LLE and prevent long-term increased stress on LLE.    Baseline  RLE 20# x 10 reps    Time  6    Period  Weeks    Status  New            Plan - 08/30/19 1136    Clinical Impression Statement  Patient demonstrates increased weakness along the quadriceps most notably with SLRs and leg press manually. She continues to have pain along the medial joint line of the knee. Patient is improving with ability to improve with weight shifts and use of cane with the L UE. Patient will benefit from further skilled therapy to return to prior level of function.    Personal Factors and Comorbidities  Age;Comorbidity 3+;Fitness    Comorbidities  CAD, vertigo, HTN, L shoulder pain/weakness     Examination-Activity Limitations  Bathing;Locomotion Level;Bend;Squat;Stairs;Stand;Dressing    Examination-Participation Restrictions  Cleaning;Community Activity;Other   Exercise   Stability/Clinical Decision Making  Evolving/Moderate complexity    Rehab Potential  Good    PT Frequency  2x / week    PT Duration  8 weeks    PT Treatment/Interventions  ADLs/Self Care Home Management;Aquatic Therapy;Cryotherapy;Electrical Stimulation;Moist Heat;DME Instruction;Gait training;Stair training;Functional mobility training;Therapeutic activities;Therapeutic exercise;Balance training;Neuromuscular re-education;Patient/family education;Manual techniques;Passive range of motion;Dry needling;Energy conservation;Taping;Joint Manipulations;Canalith Repostioning    PT Next Visit Plan  Quad therex against band, leg press; hip extension therex; STS; hip kickers    PT Home Exercise Plan  LAQ against green theraband, standing hip abd    Consulted and Agree with Plan of Care  Patient       Patient will benefit from skilled therapeutic intervention in order to improve the following deficits and impairments:  Abnormal gait, Decreased coordination, Decreased range of motion, Difficulty walking, Decreased endurance, Obesity, Decreased activity tolerance, Pain, Decreased balance, Hypomobility, Improper body mechanics, Impaired flexibility, Decreased mobility, Decreased strength, Increased edema, Postural dysfunction  Visit Diagnosis: Pain in right hip  Chronic  pain of right knee     Problem List Patient Active Problem List   Diagnosis Date Noted  . Thrombocytopenia (HCC) 04/08/2019  . HTN (hypertension) 07/05/2018  . Tobacco use 07/05/2018  . S/P drug eluting coronary stent placement 03/15/2018  . Unstable angina (HCC) 02/24/2018  . NSTEMI (non-ST elevated myocardial infarction) (HCC) 02/24/2018  . Full thickness rotator cuff tear 07/31/2017    Myrene Galas, PT DPT 08/30/2019, 12:12 PM  [facility] PHYSICAL AND SPORTS MEDICINE [address], [place], [address] Phone: [phone]   Fax:  [phone]  Name: Karen Potts MRN: [id number] Date of Birth: September 06, 1960

## 2019-09-01 ENCOUNTER — Ambulatory Visit: Payer: Medicaid Other

## 2019-09-01 ENCOUNTER — Other Ambulatory Visit: Payer: Self-pay

## 2019-09-01 DIAGNOSIS — M25551 Pain in right hip: Secondary | ICD-10-CM | POA: Diagnosis not present

## 2019-09-01 DIAGNOSIS — G8929 Other chronic pain: Secondary | ICD-10-CM

## 2019-09-01 NOTE — Therapy (Signed)
[facility] [address], [place], [address] Phone: [phone]   Fax:  [phone]  Physical Therapy Treatment/ Progress Note  Patient Details  Name: Karen Potts MRN: [id number] Date of Birth: 07/25/1960 Referring Provider (PT): Toy Cookey MD  Reporting period: 08/04/2019 - 09/01/2019  Encounter Date: 09/01/2019  PT End of Session - 09/01/19 1143    Visit Number  25    Number of Visits  30    Date for PT Re-Evaluation  09/01/19    Authorization Type  21/30; MEDICAIDE: 07/26/2019 - 09/05/2019 9/12    PT Start Time  1119    PT Stop Time  1200    PT Time Calculation (min)  41 min    Equipment Utilized During Treatment  Gait belt    Activity Tolerance  Patient tolerated treatment well   Treatment limited secondary to vertigo   Behavior During Therapy  [facility] for tasks assessed/performed       Past Medical History:  Diagnosis Date  . Diabetes mellitus without complication (HCC)   . Hypertension   . Sleep apnea   . Vertigo     Past Surgical History:  Procedure Laterality Date  . ABDOMINAL HYSTERECTOMY    . BREAST BIOPSY Left 10/2017    APOCRINE TYPE CYST WITH FIBROSIS Of the Wall  . CORONARY STENT INTERVENTION N/A 02/24/2018   Procedure: CORONARY STENT INTERVENTION;  Surgeon: Marcina Millard, MD;  Location: [facility] INVASIVE CV LAB;  Service: Cardiovascular;  Laterality: N/A;  . LEFT HEART CATH AND CORONARY ANGIOGRAPHY Right 02/24/2018   Procedure: LEFT HEART CATH AND CORONARY ANGIOGRAPHY;  Surgeon: Marcina Millard, MD;  Location: [facility] INVASIVE CV LAB;  Service: Cardiovascular;  Laterality: Right;  . SHOULDER ARTHROSCOPY WITH OPEN ROTATOR CUFF REPAIR Left 08/14/2017   Procedure: SHOULDER ARTHROSCOPY WITH ROTATOR CUFF REPAIR, BICEPS TENOTOMY, SUBACROMIAL DECOMPRESSION;  Surgeon: Lyndle Herrlich, MD;  Location: [facility] ORS;  Service: Orthopedics;  Laterality: Left;  . UMBILICAL HERNIA REPAIR      There  were no vitals filed for this visit.  Subjective Assessment - 09/01/19 1123    Subjective  Patient states increased knee and leg pain today. Patient reports her leg is getting tired with walking and reports increased pain in standing.    Pertinent History  Onset [name] 2020 during treadmill training as part of cardiac rehabilitation. Cortisone shots provide relief for "a couple months"; last shot in June, relief until September. Attempting to increase exercise for weight loss and cardiac health but currently able to walk about "one block" before requiring rest due to knee pain. Recently purchased [facility] for assistance with ambulation. Pain 5/10, 10/10 worst, 0/10 best, with pain in R hip and LBP with increased activity. Agg: walking, standing. Ease: Voltaren, Aspirin, rest. Does not bother her in non-WB. Comorbidities: CAD, HTN, smoker, occasional vertigo "comes and goes every few months", DM2; L shoulder rotator cuff repair with fair recovery.    Limitations  Walking;Standing    How long can you sit comfortably?  unlimited    How long can you stand comfortably?  5 min    How long can you walk comfortably?  20 min    Diagnostic tests  x-ray (per pt report; the cartilage was "gone" on B knees)    Patient Stated Goals  walk with less pain to lose weight    Currently in Pain?  Yes    Pain Score  7     Pain Location  Knee    Pain Orientation  Right    Pain Descriptors / Indicators  Aching    Pain Type  Chronic pain    Pain Onset  More than a month ago    Pain Frequency  Intermittent         TREATMENT Therapeutic Exercise  Stairs with UE support -- 3 x 4 stairs with UE support  Nustep in sitting with focusing on improving mobility of the knee -- x10 min level 1-2 with use of the arms Ambulation with focus on improving stride length and speed -- x 29ft Seated knee flexion/extension -- x 30   Manual therapy STM performed to the quad to decrease increased spasms and decreased pain with patient  position in sitting -- 8 min     PT Short Term Goals - 08/04/19 1416      PT SHORT TERM GOAL #1   Title  Patient will be independent with HEP as adjunct to clinical therapy and to reduce number of visits.    Baseline  HEP given    Time  2    Period  Weeks    Status  Achieved    Target Date  05/18/19      PT SHORT TERM GOAL #2   Title  Patient will demonstrate ability to perform correct gait pattern with SPC 100% of the time for improved energy conservation and functional capacity with ambulation and ADLs.    Baseline  Correct gait pattern 50% of the time    Time  2    Period  Weeks    Status  Achieved    Target Date  05/18/19      PT SHORT TERM GOAL #3   Title  Patient will report 50% or greater improvement in her symptoms of dizziness and imbalance with provoking motions/positions    Baseline  Vertigo 8/10; 08/04/2019: 90% improvement    Time  2    Period  Weeks    Status  Achieved    Target Date  05/30/19        PT Long Term Goals - 09/01/19 1139      PT LONG TERM GOAL #1   Title  Patient will improve speed to 0.8 m/sec to achieve community ambulator status and reduce fall risks.    Baseline  0.69 m/sec with SPC; 11/19 0.74; 12/21 deferred; 07/28/2019 0.878    Time  8    Period  Weeks    Status  Achieved      PT LONG TERM GOAL #2   Title  Patient will improve LEFS score by 9 points to achieve MCID for reduced disability and improved function.    Baseline  LEFS 25; 11/19 could not assess; 12/21 deferred for time; 07/28/2019 deferred for time; 09/01/2019: LEFS    Time  8    Period  Weeks    Status  Deferred      PT LONG TERM GOAL #3   Title  Patient will reduce worst pain to 6/10 to demonstrate reduced disability with ADLs and improved functional capacity.    Baseline  10/10 worst pain; 11/19 0/10; 12/21 5/10; 07/28/2019 2/10;   pain 0/10 following cortisol injection   Time  8    Period  Weeks    Status  Achieved      PT LONG TERM GOAL #4   Title  Patient will  report walking tolerance of 45 minutes for for return to cardiac walking program.    Baseline  walking tolerance 20 min; 11/19 20 min secondary to vertigo; 12/21 20 min; 07/28/2019 cannot assess; 09/01/2019: 20 min    Time  8    Period  Weeks    Status  On-going      PT LONG TERM GOAL #5   Title  Patient will report no vertigo with provoking motions or positions in order to be able to perform ADLs and mobility tasks without symptoms.    Baseline  8/10 dizziness with provoking motions; 11/19 8/10    Time  4    Period  Weeks    Status  Achieved      PT LONG TERM GOAL #6   Title  Patient will reduce worst pain to 0/10 with gait on stairs for reduced disability with gait.    Baseline  07/28/2019 2/10 on stairs; 09/01/2019: 8/10    Time  6    Period  Weeks    Status  On-going      PT LONG TERM GOAL #7   Title  Patient will perform leg press on RLE alone with 40# for 10 reps to demonstrate partity with LLE and prevent long-term increased stress on LLE.    Baseline  RLE 20# x 10 reps; 09/01/2019: 20# x 10 reps    Time  6    Period  Weeks    Status  On-going            Plan - 09/01/19 1143    Clinical Impression Statement  Patient's increase in knee pain has impacted goal reassessment for today's appointment. Patient demonstrates improvement in terms of functional measures most indicated by an improvement with her LEFS score (improving 5 points overall). She has difficulty still with performing ascending and desending of the stairs without pain. Pain was lower overall during the last reassessment, however, this was strongly impacted by patient receiving a cortizone injection before the previous reassessment. Patient will benefit from further skilled therapy to return to prior level of function.    Personal Factors and Comorbidities  Age;Comorbidity 3+;Fitness    Comorbidities  CAD, vertigo, HTN, L shoulder pain/weakness    Examination-Activity Limitations  Bathing;Locomotion  Level;Bend;Squat;Stairs;Stand;Dressing    Examination-Participation Restrictions  Cleaning;Community Activity;Other   Exercise   Stability/Clinical Decision Making  Evolving/Moderate complexity    Rehab Potential  Good    PT Frequency  2x / week    PT Duration  8 weeks    PT Treatment/Interventions  ADLs/Self Care Home Management;Aquatic Therapy;Cryotherapy;Electrical Stimulation;Moist Heat;DME Instruction;Gait training;Stair training;Functional mobility training;Therapeutic activities;Therapeutic exercise;Balance training;Neuromuscular re-education;Patient/family education;Manual techniques;Passive range of motion;Dry needling;Energy conservation;Taping;Joint Manipulations;Canalith Repostioning    PT Next Visit Plan  Quad therex against band, leg press; hip extension therex; STS; hip kickers    PT Home Exercise Plan  LAQ against green theraband, standing hip abd    Consulted and Agree with Plan of Care  Patient       Patient will benefit from skilled therapeutic intervention in order to improve the following deficits and impairments:  Abnormal gait, Decreased coordination, Decreased range of motion, Difficulty walking, Decreased endurance, Obesity, Decreased activity tolerance, Pain, Decreased balance, Hypomobility, Improper body mechanics, Impaired flexibility, Decreased mobility, Decreased strength, Increased edema, Postural dysfunction  Visit Diagnosis: Pain in right hip  Chronic pain of right knee     Problem List Patient Active Problem List   Diagnosis Date Noted  . Thrombocytopenia ([place]) 04/08/2019  . HTN (hypertension) 07/05/2018  . Tobacco use 07/05/2018  . S/P drug eluting coronary stent placement 03/15/2018  .  Unstable angina (HCC) 02/24/2018  . NSTEMI (non-ST elevated myocardial infarction) (HCC) 02/24/2018  . Full thickness rotator cuff tear 07/31/2017    Myrene Galas, PT DPT 09/01/2019, 12:43 PM  [facility] PHYSICAL AND SPORTS  MEDICINE [address] [place], [address] Phone: [phone]   Fax:  [phone]  Name: Karen Potts MRN: [id number] Date of Birth: 04-28-1961

## 2019-09-13 ENCOUNTER — Other Ambulatory Visit: Payer: Self-pay

## 2019-09-13 ENCOUNTER — Ambulatory Visit: Payer: Medicaid Other

## 2019-09-13 DIAGNOSIS — M25551 Pain in right hip: Secondary | ICD-10-CM

## 2019-09-13 DIAGNOSIS — M1711 Unilateral primary osteoarthritis, right knee: Secondary | ICD-10-CM

## 2019-09-13 DIAGNOSIS — G8929 Other chronic pain: Secondary | ICD-10-CM

## 2019-09-14 NOTE — Therapy (Signed)
[facility] [address], [place], [address] Phone: [phone]   Fax:  [phone]  Physical Therapy Treatment  Patient Details  Name: Karen Potts MRN: [id number] Date of Birth: 1961/02/18 Referring Provider (PT): Toy Cookey MD   Encounter Date: 09/13/2019  PT End of Session - 09/13/19 1623    Visit Number  26    Number of Visits  40    Date for PT Re-Evaluation  10/25/19    Authorization Type  09/13/2019 - 10/24/2019 11 PT visits    PT Start Time  1600    PT Stop Time  1645    PT Time Calculation (min)  45 min    Equipment Utilized During Treatment  Gait belt    Activity Tolerance  Patient tolerated treatment well   Treatment limited secondary to vertigo   Behavior During Therapy  [facility] for tasks assessed/performed       Past Medical History:  Diagnosis Date  . Diabetes mellitus without complication (HCC)   . Hypertension   . Sleep apnea   . Vertigo     Past Surgical History:  Procedure Laterality Date  . ABDOMINAL HYSTERECTOMY    . BREAST BIOPSY Left 10/2017    APOCRINE TYPE CYST WITH FIBROSIS Of the Wall  . CORONARY STENT INTERVENTION N/A 02/24/2018   Procedure: CORONARY STENT INTERVENTION;  Surgeon: Marcina Millard, MD;  Location: [facility] INVASIVE CV LAB;  Service: Cardiovascular;  Laterality: N/A;  . LEFT HEART CATH AND CORONARY ANGIOGRAPHY Right 02/24/2018   Procedure: LEFT HEART CATH AND CORONARY ANGIOGRAPHY;  Surgeon: Marcina Millard, MD;  Location: [facility] INVASIVE CV LAB;  Service: Cardiovascular;  Laterality: Right;  . SHOULDER ARTHROSCOPY WITH OPEN ROTATOR CUFF REPAIR Left 08/14/2017   Procedure: SHOULDER ARTHROSCOPY WITH ROTATOR CUFF REPAIR, BICEPS TENOTOMY, SUBACROMIAL DECOMPRESSION;  Surgeon: Lyndle Herrlich, MD;  Location: [facility] ORS;  Service: Orthopedics;  Laterality: Left;  . UMBILICAL HERNIA REPAIR      There were no vitals filed for this visit.  Subjective  Assessment - 09/13/19 1616    Subjective  Patient reports no major changes since previous session. Patient states her Knee has been hurting more after walking for 25 minutes.    Pertinent History  Onset [name] 2020 during treadmill training as part of cardiac rehabilitation. Cortisone shots provide relief for "a couple months"; last shot in June, relief until September. Attempting to increase exercise for weight loss and cardiac health but currently able to walk about "one block" before requiring rest due to knee pain. Recently purchased [facility] for assistance with ambulation. Pain 5/10, 10/10 worst, 0/10 best, with pain in R hip and LBP with increased activity. Agg: walking, standing. Ease: Voltaren, Aspirin, rest. Does not bother her in non-WB. Comorbidities: CAD, HTN, smoker, occasional vertigo "comes and goes every few months", DM2; L shoulder rotator cuff repair with fair recovery.    Limitations  Walking;Standing    How long can you sit comfortably?  unlimited    How long can you stand comfortably?  5 min    How long can you walk comfortably?  20 min    Diagnostic tests  x-ray (per pt report; the cartilage was "gone" on B knees)    Patient Stated Goals  walk with less pain to lose weight    Currently in Pain?  Yes    Pain Score  7     Pain Location  Knee    Pain Orientation  Right  Pain Descriptors / Indicators  Aching    Pain Type  Chronic pain    Pain Onset  More than a month ago    Pain Frequency  Intermittent          TREATMENT Therapeutic Exercise  Nustep in sitting with focusing on improving mobility of the knee -- x10 min level 1-2 with use of the arms Ambulation with focus on improving stride length and speed -- x 232ft Seated knee flexion/extension -- x 30 with ball Quad sets in sitting - x 20   Performed exercises in sitting to improve her quad sets    PT Education - 09/13/19 1622    Education Details  form/technique with exercisee    Person(s) Educated  Patient     Methods  Explanation;Demonstration    Comprehension  Verbalized understanding;Returned demonstration       PT Short Term Goals - 08/04/19 1416      PT SHORT TERM GOAL #1   Title  Patient will be independent with HEP as adjunct to clinical therapy and to reduce number of visits.    Baseline  HEP given    Time  2    Period  Weeks    Status  Achieved    Target Date  05/18/19      PT SHORT TERM GOAL #2   Title  Patient will demonstrate ability to perform correct gait pattern with SPC 100% of the time for improved energy conservation and functional capacity with ambulation and ADLs.    Baseline  Correct gait pattern 50% of the time    Time  2    Period  Weeks    Status  Achieved    Target Date  05/18/19      PT SHORT TERM GOAL #3   Title  Patient will report 50% or greater improvement in her symptoms of dizziness and imbalance with provoking motions/positions    Baseline  Vertigo 8/10; 08/04/2019: 90% improvement    Time  2    Period  Weeks    Status  Achieved    Target Date  05/30/19        PT Long Term Goals - 09/13/19 1628      PT LONG TERM GOAL #1   Title  Patient will improve 10MW speed to 0.8 m/sec to achieve community ambulator status and reduce fall risks.    Baseline  0.69 m/sec with SPC; 11/19 0.74; 12/21 deferred; 07/28/2019 0.878    Time  8    Period  Weeks    Status  Achieved      PT LONG TERM GOAL #2   Title  Patient will improve LEFS score by 9 points to achieve MCID for reduced disability and improved function.    Baseline  LEFS 25; 11/19 could not assess; 12/21 deferred for time; 07/28/2019 deferred for time; 09/01/2019: LEFS: 30    Time  8    Period  Weeks    Status  On-going      PT LONG TERM GOAL #3   Title  Patient will reduce worst pain to 6/10 to demonstrate reduced disability with ADLs and improved functional capacity.    Baseline  10/10 worst pain; 11/19 0/10; 12/21 5/10; 07/28/2019 2/10;   pain 0/10 following cortisol injection   Time  8    Period   Weeks    Status  Achieved      PT LONG TERM GOAL #4   Title  Patient will report walking tolerance  of 45 minutes for for return to cardiac walking program.    Baseline  walking tolerance 20 min; 11/19 20 min secondary to vertigo; 12/21 20 min; 07/28/2019 cannot assess; 09/01/2019: 20 min    Time  8    Period  Weeks    Status  On-going      PT LONG TERM GOAL #5   Title  Patient will report no vertigo with provoking motions or positions in order to be able to perform ADLs and mobility tasks without symptoms.    Baseline  8/10 dizziness with provoking motions; 11/19 8/10    Time  4    Period  Weeks    Status  Achieved      PT LONG TERM GOAL #6   Title  Patient will reduce worst pain to 0/10 with gait on stairs for reduced disability with gait.    Baseline  07/28/2019 2/10 on stairs; 09/01/2019: 8/10    Time  6    Period  Weeks    Status  On-going      PT LONG TERM GOAL #7   Title  Patient will perform leg press on RLE alone with 40# for 10 reps to demonstrate partity with LLE and prevent long-term increased stress on LLE.    Baseline  RLE 20# x 10 reps; 09/01/2019: 20# x 10 reps    Time  6    Period  Weeks    Status  On-going            Plan - 09/13/19 1649    Clinical Impression Statement  Performed exercises in sitting and standing to improve quad activation and decrease increased guarding with performing bending movements. Patient demonstrates ability to performing 15# on the leg press and has less pain after performing quad sets indicating poor activation. Patient is improving overall and will benefit from further skilled therapy to return to prior level of function.    Personal Factors and Comorbidities  Age;Comorbidity 3+;Fitness    Comorbidities  CAD, vertigo, HTN, L shoulder pain/weakness    Examination-Activity Limitations  Bathing;Locomotion Level;Bend;Squat;Stairs;Stand;Dressing    Examination-Participation Restrictions  Cleaning;Community Activity;Other   Exercise    Stability/Clinical Decision Making  Evolving/Moderate complexity    Rehab Potential  Good    PT Frequency  2x / week    PT Duration  8 weeks    PT Treatment/Interventions  ADLs/Self Care Home Management;Aquatic Therapy;Cryotherapy;Electrical Stimulation;Moist Heat;DME Instruction;Gait training;Stair training;Functional mobility training;Therapeutic activities;Therapeutic exercise;Balance training;Neuromuscular re-education;Patient/family education;Manual techniques;Passive range of motion;Dry needling;Energy conservation;Taping;Joint Manipulations;Canalith Repostioning    PT Next Visit Plan  Quad therex against band, leg press; hip extension therex; STS; hip kickers    PT Home Exercise Plan  LAQ against green theraband, standing hip abd    Consulted and Agree with Plan of Care  Patient       Patient will benefit from skilled therapeutic intervention in order to improve the following deficits and impairments:  Abnormal gait, Decreased coordination, Decreased range of motion, Difficulty walking, Decreased endurance, Obesity, Decreased activity tolerance, Pain, Decreased balance, Hypomobility, Improper body mechanics, Impaired flexibility, Decreased mobility, Decreased strength, Increased edema, Postural dysfunction  Visit Diagnosis: Pain in right hip  Chronic pain of right knee  Primary osteoarthritis of right knee     Problem List Patient Active Problem List   Diagnosis Date Noted  . Thrombocytopenia (HCC) 04/08/2019  . HTN (hypertension) 07/05/2018  . Tobacco use 07/05/2018  . S/P drug eluting coronary stent placement 03/15/2018  . Unstable angina (HCC) 02/24/2018  .  NSTEMI (non-ST elevated myocardial infarction) (HCC) 02/24/2018  . Full thickness rotator cuff tear 07/31/2017    Myrene Galas, PT DPT 09/14/2019, 12:44 PM  [facility] PHYSICAL AND SPORTS MEDICINE [address], [place], [address] Phone: [phone]   Fax:   [phone]  Name: Karen Potts MRN: [id number] Date of Birth: February 29, 1961

## 2019-09-15 ENCOUNTER — Ambulatory Visit: Payer: Medicaid Other

## 2019-09-15 ENCOUNTER — Other Ambulatory Visit: Payer: Self-pay

## 2019-09-15 DIAGNOSIS — M25561 Pain in right knee: Secondary | ICD-10-CM

## 2019-09-15 DIAGNOSIS — G8929 Other chronic pain: Secondary | ICD-10-CM

## 2019-09-15 DIAGNOSIS — M25551 Pain in right hip: Secondary | ICD-10-CM

## 2019-09-15 NOTE — Therapy (Signed)
[facility] SPORTS MEDIC[facility] [address] [place], [address] Phone: [phone]   Fax:  [phone]  Physical Therapy Treatment  Patient Details  Name: Karen Potts MRN: [id number] Date of Birth: [birth year] Referring Provider (PT): Gennette Pac MD   Encounter Date: 09/15/2019  PT End of Session - 09/15/19 1618    Visit Number  27    Number of Visits  40    Date for PT Re-Evaluation  10/25/19    Authorization Type  09/13/2019 - 10/24/2019 11 PT visits    PT Start Time  1600    PT Stop Time  1645    PT Time Calculation (min)  45 min    Equipment Utilized During Treatment  Gait belt    Activity Tolerance  Patient tolerated treatment well   Treatment limited secondary to vertigo   Behavior During Therapy  [facility] for tasks assessed/performed       Past Medical History:  Diagnosis Date  . Diabetes mellitus without complication ([place])   . Hypertension   . Sleep apnea   . Vertigo     Past Surgical History:  Procedure Laterality Date  . ABDOMINAL HYSTERECTOMY    . BREAST BIOPSY Left 10/2017    APOCRINE TYPE CYST WITH FIBROSIS Of the Wall  . CORONARY STENT INTERVENTION N/A 02/24/2018   Procedure: CORONARY STENT INTERVENTION;  Surgeon: Isaias Cowman, MD;  Location: [place] CV LAB;  Service: Cardiovascular;  Laterality: N/A;  . LEFT HEART CATH AND CORONARY ANGIOGRAPHY Right 02/24/2018   Procedure: LEFT HEART CATH AND CORONARY ANGIOGRAPHY;  Surgeon: Isaias Cowman, MD;  Location: [place] CV LAB;  Service: Cardiovascular;  Laterality: Right;  . SHOULDER ARTHROSCOPY WITH OPEN ROTATOR CUFF REPAIR Left 08/14/2017   Procedure: SHOULDER ARTHROSCOPY WITH ROTATOR CUFF REPAIR, BICEPS TENOTOMY, SUBACROMIAL DECOMPRESSION;  Surgeon: Lovell Sheehan, MD;  Location: [facility] ORS;  Service: Orthopedics;  Laterality: Left;  . UMBILICAL HERNIA REPAIR      There were no vitals filed for this visit.  Subjective  Assessment - 09/15/19 1608    Subjective  Patient states increased R knee pain and states her knee has had increased inflammation since the previous session. Patient reports increased pain overall.    Pertinent History  Onset [name] 2020 during treadmill training as part of cardiac rehabilitation. Cortisone shots provide relief for "a couple months"; last shot in June, relief until September. Attempting to increase exercise for weight loss and cardiac health but currently able to walk about "one block" before requiring rest due to knee pain. Recently purchased [facility] for assistance with ambulation. Pain 5/10, 10/10 worst, 0/10 best, with pain in R hip and LBP with increased activity. Agg: walking, standing. Ease: Voltaren, Aspirin, rest. Does not bother her in non-WB. Comorbidities: CAD, HTN, smoker, occasional vertigo "comes and goes every few months", DM2; L shoulder rotator cuff repair with fair recovery.    Limitations  Walking;Standing    How long can you sit comfortably?  unlimited    How long can you stand comfortably?  5 min    How long can you walk comfortably?  20 min    Diagnostic tests  x-ray (per pt report; the cartilage was "gone" on B knees)    Patient Stated Goals  walk with less pain to lose weight    Currently in Pain?  Yes    Pain Score  7     Pain Location  Knee    Pain Orientation  Right    Pain Descriptors / Indicators  Aching    Pain Type  Chronic pain    Pain Onset  More than a month ago    Pain Frequency  Intermittent         TREATMENT Therapeutic Exercise  Nustep in sitting with focusing on improving mobility of the knee -- x10 min level 2 with use of the arms Seated knee flexion/extension -- 2x 30 with ball Pre-Gait for quad activation - x 20 Single leg stance - 5sec holds - x10 Feet together heel lifts in sitting - x 20  Quad sets in sitting - x 20  Ambulation with focus on improving stride length and speed -- x 156ft with use of cane  Performed exercises to  improve quad strength and quad activation on the affected side   PT Education - 09/15/19 1616    Education Details  form/technique with exercise    Person(s) Educated  Patient    Methods  Explanation;Demonstration    Comprehension  Verbalized understanding;Returned demonstration       PT Short Term Goals - 08/04/19 1416      PT SHORT TERM GOAL #1   Title  Patient will be independent with HEP as adjunct to clinical therapy and to reduce number of visits.    Baseline  HEP given    Time  2    Period  Weeks    Status  Achieved    Target Date  05/18/19      PT SHORT TERM GOAL #2   Title  Patient will demonstrate ability to perform correct gait pattern with SPC 100% of the time for improved energy conservation and functional capacity with ambulation and ADLs.    Baseline  Correct gait pattern 50% of the time    Time  2    Period  Weeks    Status  Achieved    Target Date  05/18/19      PT SHORT TERM GOAL #3   Title  Patient will report 50% or greater improvement in her symptoms of dizziness and imbalance with provoking motions/positions    Baseline  Vertigo 8/10; 08/04/2019: 90% improvement    Time  2    Period  Weeks    Status  Achieved    Target Date  05/30/19        PT Long Term Goals - 09/13/19 1628      PT LONG TERM GOAL #1   Title  Patient will improve speed to 0.8 m/sec to achieve community ambulator status and reduce fall risks.    Baseline  0.69 m/sec with SPC; 11/19 0.74; 12/21 deferred; 07/28/2019 0.878    Time  8    Period  Weeks    Status  Achieved      PT LONG TERM GOAL #2   Title  Patient will improve LEFS score by 9 points to achieve MCID for reduced disability and improved function.    Baseline  LEFS 25; 11/19 could not assess; 12/21 deferred for time; 07/28/2019 deferred for time; 09/01/2019: LEFS: 30    Time  8    Period  Weeks    Status  On-going      PT LONG TERM GOAL #3   Title  Patient will reduce worst pain to 6/10 to demonstrate reduced  disability with ADLs and improved functional capacity.    Baseline  10/10 worst pain; 11/19 0/10; 12/21 5/10; 07/28/2019 2/10;   pain 0/10 following cortisol injection  Time  8    Period  Weeks    Status  Achieved      PT LONG TERM GOAL #4   Title  Patient will report walking tolerance of 45 minutes for for return to cardiac walking program.    Baseline  walking tolerance 20 min; 11/19 20 min secondary to vertigo; 12/21 20 min; 07/28/2019 cannot assess; 09/01/2019: 20 min    Time  8    Period  Weeks    Status  On-going      PT LONG TERM GOAL #5   Title  Patient will report no vertigo with provoking motions or positions in order to be able to perform ADLs and mobility tasks without symptoms.    Baseline  8/10 dizziness with provoking motions; 11/19 8/10    Time  4    Period  Weeks    Status  Achieved      PT LONG TERM GOAL #6   Title  Patient will reduce worst pain to 0/10 with gait on stairs for reduced disability with gait.    Baseline  07/28/2019 2/10 on stairs; 09/01/2019: 8/10    Time  6    Period  Weeks    Status  On-going      PT LONG TERM GOAL #7   Title  Patient will perform leg press on RLE alone with 40# for 10 reps to demonstrate partity with LLE and prevent long-term increased stress on LLE.    Baseline  RLE 20# x 10 reps; 09/01/2019: 20# x 10 reps    Time  6    Period  Weeks    Status  On-going            Plan - 09/15/19 1627    Clinical Impression Statement  Patient demonstrates improvement with quad activation during today's visit which demonstrates ability to perform pre gait with good quad activation. However, patient continues to have increased pain along the knee most notably with terminal knee extension. Patient will benefit from further skilled therapy to return to prior level of function.    Personal Factors and Comorbidities  Age;Comorbidity 3+;Fitness    Comorbidities  CAD, vertigo, HTN, L shoulder pain/weakness    Examination-Activity Limitations   Bathing;Locomotion Level;Bend;Squat;Stairs;Stand;Dressing    Examination-Participation Restrictions  Cleaning;Community Activity;Other   Exercise   Stability/Clinical Decision Making  Evolving/Moderate complexity    Rehab Potential  Good    PT Frequency  2x / week    PT Duration  8 weeks    PT Treatment/Interventions  ADLs/Self Care Home Management;Aquatic Therapy;Cryotherapy;Electrical Stimulation;Moist Heat;DME Instruction;Gait training;Stair training;Functional mobility training;Therapeutic activities;Therapeutic exercise;Balance training;Neuromuscular re-education;Patient/family education;Manual techniques;Passive range of motion;Dry needling;Energy conservation;Taping;Joint Manipulations;Canalith Repostioning    PT Next Visit Plan  Quad therex against band, leg press; hip extension therex; STS; hip kickers    PT Home Exercise Plan  LAQ against green theraband, standing hip abd    Consulted and Agree with Plan of Care  Patient       Patient will benefit from skilled therapeutic intervention in order to improve the following deficits and impairments:  Abnormal gait, Decreased coordination, Decreased range of motion, Difficulty walking, Decreased endurance, Obesity, Decreased activity tolerance, Pain, Decreased balance, Hypomobility, Improper body mechanics, Impaired flexibility, Decreased mobility, Decreased strength, Increased edema, Postural dysfunction  Visit Diagnosis: Pain in right hip  Chronic pain of right knee     Problem List Patient Active Problem List   Diagnosis Date Noted  . Thrombocytopenia (HCC) 04/08/2019  . HTN (hypertension)  07/05/2018  . Tobacco use 07/05/2018  . S/P drug eluting coronary stent placement 03/15/2018  . Unstable angina (HCC) 02/24/2018  . NSTEMI (non-ST elevated myocardial infarction) (HCC) 02/24/2018  . Full thickness rotator cuff tear 07/31/2017    Myrene Galas 09/15/2019, 5:01 PM  [facility] PHYSICAL  AND SPORTS MEDICINE [address], [place], [address] Phone: [phone]   Fax:  [phone]  Name: YUKIE BERGERON MRN: [id number] Date of Birth: 1960/08/24

## 2019-09-19 ENCOUNTER — Other Ambulatory Visit: Payer: Self-pay

## 2019-09-19 ENCOUNTER — Ambulatory Visit: Payer: Medicaid Other

## 2019-09-19 DIAGNOSIS — G8929 Other chronic pain: Secondary | ICD-10-CM

## 2019-09-19 DIAGNOSIS — M25551 Pain in right hip: Secondary | ICD-10-CM | POA: Diagnosis not present

## 2019-09-19 NOTE — Therapy (Signed)
[facility] [address] [place], [address] Phone: [phone]   Fax:  [phone]  Physical Therapy Treatment  Patient Details  Name: Karen Potts MRN: [id number] Date of Birth: 04-16-1961 Referring Provider (PT): Toy Cookey MD   Encounter Date: 09/19/2019  PT End of Session - 09/19/19 1050    Visit Number  28    Number of Visits  40    Date for PT Re-Evaluation  10/25/19    Authorization Type  09/13/2019 - 10/24/2019 11 PT visits    PT Start Time  1030    PT Stop Time  1115    PT Time Calculation (min)  45 min    Equipment Utilized During Treatment  Gait belt    Activity Tolerance  Patient tolerated treatment well   Treatment limited secondary to vertigo   Behavior During Therapy  [facility] for tasks assessed/performed       Past Medical History:  Diagnosis Date  . Diabetes mellitus without complication (HCC)   . Hypertension   . Sleep apnea   . Vertigo     Past Surgical History:  Procedure Laterality Date  . ABDOMINAL HYSTERECTOMY    . BREAST BIOPSY Left 10/2017    APOCRINE TYPE CYST WITH FIBROSIS Of the Wall  . CORONARY STENT INTERVENTION N/A 02/24/2018   Procedure: CORONARY STENT INTERVENTION;  Surgeon: Marcina Millard, MD;  Location: [facility] INVASIVE CV LAB;  Service: Cardiovascular;  Laterality: N/A;  . LEFT HEART CATH AND CORONARY ANGIOGRAPHY Right 02/24/2018   Procedure: LEFT HEART CATH AND CORONARY ANGIOGRAPHY;  Surgeon: Marcina Millard, MD;  Location: [facility] INVASIVE CV LAB;  Service: Cardiovascular;  Laterality: Right;  . SHOULDER ARTHROSCOPY WITH OPEN ROTATOR CUFF REPAIR Left 08/14/2017   Procedure: SHOULDER ARTHROSCOPY WITH ROTATOR CUFF REPAIR, BICEPS TENOTOMY, SUBACROMIAL DECOMPRESSION;  Surgeon: Lyndle Herrlich, MD;  Location: [facility] ORS;  Service: Orthopedics;  Laterality: Left;  . UMBILICAL HERNIA REPAIR      There were no vitals filed for this visit.  Subjective  Assessment - 09/19/19 1039    Subjective  Patient reports she has been resting her knee over the weekened. She states she did her lower level exercise but did not perform any excessive walking.    Pertinent History  Onset [name] 2020 during treadmill training as part of cardiac rehabilitation. Cortisone shots provide relief for "a couple months"; last shot in June, relief until September. Attempting to increase exercise for weight loss and cardiac health but currently able to walk about "one block" before requiring rest due to knee pain. Recently purchased [facility] for assistance with ambulation. Pain 5/10, 10/10 worst, 0/10 best, with pain in R hip and LBP with increased activity. Agg: walking, standing. Ease: Voltaren, Aspirin, rest. Does not bother her in non-WB. Comorbidities: CAD, HTN, smoker, occasional vertigo "comes and goes every few months", DM2; L shoulder rotator cuff repair with fair recovery.    Limitations  Walking;Standing    How long can you sit comfortably?  unlimited    How long can you stand comfortably?  5 min    How long can you walk comfortably?  20 min    Diagnostic tests  x-ray (per pt report; the cartilage was "gone" on B knees)    Patient Stated Goals  walk with less pain to lose weight    Currently in Pain?  No/denies    Pain Location  Knee    Pain Orientation  Right    Pain  Descriptors / Indicators  Aching    Pain Type  Chronic pain    Pain Onset  More than a month ago    Pain Frequency  Intermittent             TREATMENT Therapeutic Exercise  Pre-Gait for quad activation - x 20 Quad sets in sitting - x 20  Ambulation with focus on improving stride length and speed -- x 26ft with use of cane in the L UE Seated knee flexion/extension -- 2x 30 with ball Leg Press in sitting at OMEGA - x 20 B LE 45#; Leg press SLS on R x 10 15#  Nustep in sitting with focusing on improving mobility of the knee -- x10 min level 2 with use of the arms   Performed exercises to improve  quad strength and quad activation on the affected side  PT Short Term Goals - 08/04/19 1416      PT SHORT TERM GOAL #1   Title  Patient will be independent with HEP as adjunct to clinical therapy and to reduce number of visits.    Baseline  HEP given    Time  2    Period  Weeks    Status  Achieved    Target Date  05/18/19      PT SHORT TERM GOAL #2   Title  Patient will demonstrate ability to perform correct gait pattern with SPC 100% of the time for improved energy conservation and functional capacity with ambulation and ADLs.    Baseline  Correct gait pattern 50% of the time    Time  2    Period  Weeks    Status  Achieved    Target Date  05/18/19      PT SHORT TERM GOAL #3   Title  Patient will report 50% or greater improvement in her symptoms of dizziness and imbalance with provoking motions/positions    Baseline  Vertigo 8/10; 08/04/2019: 90% improvement    Time  2    Period  Weeks    Status  Achieved    Target Date  05/30/19        PT Long Term Goals - 09/13/19 1628      PT LONG TERM GOAL #1   Title  Patient will improve speed to 0.8 m/sec to achieve community ambulator status and reduce fall risks.    Baseline  0.69 m/sec with SPC; 11/19 0.74; 12/21 deferred; 07/28/2019 0.878    Time  8    Period  Weeks    Status  Achieved      PT LONG TERM GOAL #2   Title  Patient will improve LEFS score by 9 points to achieve MCID for reduced disability and improved function.    Baseline  LEFS 25; 11/19 could not assess; 12/21 deferred for time; 07/28/2019 deferred for time; 09/01/2019: LEFS: 30    Time  8    Period  Weeks    Status  On-going      PT LONG TERM GOAL #3   Title  Patient will reduce worst pain to 6/10 to demonstrate reduced disability with ADLs and improved functional capacity.    Baseline  10/10 worst pain; 11/19 0/10; 12/21 5/10; 07/28/2019 2/10;   pain 0/10 following cortisol injection   Time  8    Period  Weeks    Status  Achieved      PT LONG TERM GOAL  #4   Title  Patient will report walking tolerance  of 45 minutes for for return to cardiac walking program.    Baseline  walking tolerance 20 min; 11/19 20 min secondary to vertigo; 12/21 20 min; 07/28/2019 cannot assess; 09/01/2019: 20 min    Time  8    Period  Weeks    Status  On-going      PT LONG TERM GOAL #5   Title  Patient will report no vertigo with provoking motions or positions in order to be able to perform ADLs and mobility tasks without symptoms.    Baseline  8/10 dizziness with provoking motions; 11/19 8/10    Time  4    Period  Weeks    Status  Achieved      PT LONG TERM GOAL #6   Title  Patient will reduce worst pain to 0/10 with gait on stairs for reduced disability with gait.    Baseline  07/28/2019 2/10 on stairs; 09/01/2019: 8/10    Time  6    Period  Weeks    Status  On-going      PT LONG TERM GOAL #7   Title  Patient will perform leg press on RLE alone with 40# for 10 reps to demonstrate partity with LLE and prevent long-term increased stress on LLE.    Baseline  RLE 20# x 10 reps; 09/01/2019: 20# x 10 reps    Time  6    Period  Weeks    Status  On-going            Plan - 09/19/19 1058    Clinical Impression Statement  Less pain with exercises today with improvement with Leg press resistance level. Patient conitnues to have inrceased pain and crepitus especially with end range knee extension. Patient demonstrates imprved walking ability with use of cane in L UE to off load R LE. Patient will benefit from further skilled therapy to return to prior level of function.    Personal Factors and Comorbidities  Age;Comorbidity 3+;Fitness    Comorbidities  CAD, vertigo, HTN, L shoulder pain/weakness    Examination-Activity Limitations  Bathing;Locomotion Level;Bend;Squat;Stairs;Stand;Dressing    Examination-Participation Restrictions  Cleaning;Community Activity;Other   Exercise   Stability/Clinical Decision Making  Evolving/Moderate complexity    Rehab Potential  Good     PT Frequency  2x / week    PT Duration  8 weeks    PT Treatment/Interventions  ADLs/Self Care Home Management;Aquatic Therapy;Cryotherapy;Electrical Stimulation;Moist Heat;DME Instruction;Gait training;Stair training;Functional mobility training;Therapeutic activities;Therapeutic exercise;Balance training;Neuromuscular re-education;Patient/family education;Manual techniques;Passive range of motion;Dry needling;Energy conservation;Taping;Joint Manipulations;Canalith Repostioning    PT Next Visit Plan  Quad therex against band, leg press; hip extension therex; STS; hip kickers    PT Home Exercise Plan  LAQ against green theraband, standing hip abd    Consulted and Agree with Plan of Care  Patient       Patient will benefit from skilled therapeutic intervention in order to improve the following deficits and impairments:  Abnormal gait, Decreased coordination, Decreased range of motion, Difficulty walking, Decreased endurance, Obesity, Decreased activity tolerance, Pain, Decreased balance, Hypomobility, Improper body mechanics, Impaired flexibility, Decreased mobility, Decreased strength, Increased edema, Postural dysfunction  Visit Diagnosis: Pain in right hip  Chronic pain of right knee     Problem List Patient Active Problem List   Diagnosis Date Noted  . Thrombocytopenia (HCC) 04/08/2019  . HTN (hypertension) 07/05/2018  . Tobacco use 07/05/2018  . S/P drug eluting coronary stent placement 03/15/2018  . Unstable angina (HCC) 02/24/2018  . NSTEMI (non-ST elevated myocardial  infarction) ([place]) 02/24/2018  . Full thickness rotator cuff tear 07/31/2017    Blythe Stanford, PT DPT 09/19/2019, 11:21 AM  [place] [facility] [address] [place], [address] Phone: [phone]   Fax:  [phone]  Name: NETTIE WYFFELS MRN: [id number] Date of Birth: 31-May-1961

## 2019-09-20 ENCOUNTER — Ambulatory Visit: Payer: Medicaid Other

## 2019-09-22 ENCOUNTER — Other Ambulatory Visit: Payer: Self-pay

## 2019-09-22 ENCOUNTER — Ambulatory Visit: Payer: Medicaid Other

## 2019-09-22 DIAGNOSIS — G8929 Other chronic pain: Secondary | ICD-10-CM

## 2019-09-22 DIAGNOSIS — M25551 Pain in right hip: Secondary | ICD-10-CM | POA: Diagnosis not present

## 2019-09-22 DIAGNOSIS — M25561 Pain in right knee: Secondary | ICD-10-CM

## 2019-09-22 NOTE — Therapy (Signed)
[facility] [address], [place], [address] Phone: [phone]   Fax:  [phone]  Physical Therapy Treatment  Patient Details  Name: Karen Potts MRN: [id number] Date of Birth: 09-26-1960 Referring Provider (PT): Toy Cookey MD   Encounter Date: 09/22/2019  PT End of Session - 09/22/19 1307    Visit Number  29    Number of Visits  40    Date for PT Re-Evaluation  10/25/19    Authorization Type  09/13/2019 - 10/24/2019 11 PT visits    PT Start Time  1300    PT Stop Time  1345    PT Time Calculation (min)  45 min    Equipment Utilized During Treatment  Gait belt    Activity Tolerance  Patient tolerated treatment well   Treatment limited secondary to vertigo   Behavior During Therapy  [facility] for tasks assessed/performed       Past Medical History:  Diagnosis Date  . Diabetes mellitus without complication (HCC)   . Hypertension   . Sleep apnea   . Vertigo     Past Surgical History:  Procedure Laterality Date  . ABDOMINAL HYSTERECTOMY    . BREAST BIOPSY Left 10/2017    APOCRINE TYPE CYST WITH FIBROSIS Of the Wall  . CORONARY STENT INTERVENTION N/A 02/24/2018   Procedure: CORONARY STENT INTERVENTION;  Surgeon: Marcina Millard, MD;  Location: [facility] INVASIVE CV LAB;  Service: Cardiovascular;  Laterality: N/A;  . LEFT HEART CATH AND CORONARY ANGIOGRAPHY Right 02/24/2018   Procedure: LEFT HEART CATH AND CORONARY ANGIOGRAPHY;  Surgeon: Marcina Millard, MD;  Location: [facility] INVASIVE CV LAB;  Service: Cardiovascular;  Laterality: Right;  . SHOULDER ARTHROSCOPY WITH OPEN ROTATOR CUFF REPAIR Left 08/14/2017   Procedure: SHOULDER ARTHROSCOPY WITH ROTATOR CUFF REPAIR, BICEPS TENOTOMY, SUBACROMIAL DECOMPRESSION;  Surgeon: Lyndle Herrlich, MD;  Location: [facility] ORS;  Service: Orthopedics;  Laterality: Left;  . UMBILICAL HERNIA REPAIR      There were no vitals filed for this visit.  Subjective  Assessment - 09/22/19 1305    Subjective  Patient reports increased achiness along the R knee but states no significant increase in pain.    Pertinent History  Onset [name] 2020 during treadmill training as part of cardiac rehabilitation. Cortisone shots provide relief for "a couple months"; last shot in June, relief until September. Attempting to increase exercise for weight loss and cardiac health but currently able to walk about "one block" before requiring rest due to knee pain. Recently purchased [facility] for assistance with ambulation. Pain 5/10, 10/10 worst, 0/10 best, with pain in R hip and LBP with increased activity. Agg: walking, standing. Ease: Voltaren, Aspirin, rest. Does not bother her in non-WB. Comorbidities: CAD, HTN, smoker, occasional vertigo "comes and goes every few months", DM2; L shoulder rotator cuff repair with fair recovery.    Limitations  Walking;Standing    How long can you sit comfortably?  unlimited    How long can you stand comfortably?  5 min    How long can you walk comfortably?  20 min    Diagnostic tests  x-ray (per pt report; the cartilage was "gone" on B knees)    Patient Stated Goals  walk with less pain to lose weight    Currently in Pain?  No/denies    Pain Onset  More than a month ago        TREATMENT Therapeutic Exercise  Ambulation with focus on improving stride length  and speed -- x 2101ft with use of cane in the L UE Seated knee flexion/extension -- 2x 30 with ball Pre-Gait for quad activation - x 20 Hip abduction in sitting with BTB - x 20  Hip ER with BTB with performing sit to stands - x 20  Nustep in sitting with focusing on improving mobility of the knee -- x10 min level 2 with use of the arms Side stepping in standing with UE support - x 10 B     Performed exercises to improve quad strength and quad activation on the affected side  PT Education - 09/22/19 1306    Education Details  form/technique with exercise    Person(s) Educated  Patient     Methods  Explanation;Demonstration    Comprehension  Verbalized understanding;Returned demonstration       PT Short Term Goals - 08/04/19 1416      PT SHORT TERM GOAL #1   Title  Patient will be independent with HEP as adjunct to clinical therapy and to reduce number of visits.    Baseline  HEP given    Time  2    Period  Weeks    Status  Achieved    Target Date  05/18/19      PT SHORT TERM GOAL #2   Title  Patient will demonstrate ability to perform correct gait pattern with SPC 100% of the time for improved energy conservation and functional capacity with ambulation and ADLs.    Baseline  Correct gait pattern 50% of the time    Time  2    Period  Weeks    Status  Achieved    Target Date  05/18/19      PT SHORT TERM GOAL #3   Title  Patient will report 50% or greater improvement in her symptoms of dizziness and imbalance with provoking motions/positions    Baseline  Vertigo 8/10; 08/04/2019: 90% improvement    Time  2    Period  Weeks    Status  Achieved    Target Date  05/30/19        PT Long Term Goals - 09/13/19 1628      PT LONG TERM GOAL #1   Title  Patient will improve speed to 0.8 m/sec to achieve community ambulator status and reduce fall risks.    Baseline  0.69 m/sec with SPC; 11/19 0.74; 12/21 deferred; 07/28/2019 0.878    Time  8    Period  Weeks    Status  Achieved      PT LONG TERM GOAL #2   Title  Patient will improve LEFS score by 9 points to achieve MCID for reduced disability and improved function.    Baseline  LEFS 25; 11/19 could not assess; 12/21 deferred for time; 07/28/2019 deferred for time; 09/01/2019: LEFS: 30    Time  8    Period  Weeks    Status  On-going      PT LONG TERM GOAL #3   Title  Patient will reduce worst pain to 6/10 to demonstrate reduced disability with ADLs and improved functional capacity.    Baseline  10/10 worst pain; 11/19 0/10; 12/21 5/10; 07/28/2019 2/10;   pain 0/10 following cortisol injection   Time  8    Period   Weeks    Status  Achieved      PT LONG TERM GOAL #4   Title  Patient will report walking tolerance of 45 minutes for for return  to cardiac walking program.    Baseline  walking tolerance 20 min; 11/19 20 min secondary to vertigo; 12/21 20 min; 07/28/2019 cannot assess; 09/01/2019: 20 min    Time  8    Period  Weeks    Status  On-going      PT LONG TERM GOAL #5   Title  Patient will report no vertigo with provoking motions or positions in order to be able to perform ADLs and mobility tasks without symptoms.    Baseline  8/10 dizziness with provoking motions; 11/19 8/10    Time  4    Period  Weeks    Status  Achieved      PT LONG TERM GOAL #6   Title  Patient will reduce worst pain to 0/10 with gait on stairs for reduced disability with gait.    Baseline  07/28/2019 2/10 on stairs; 09/01/2019: 8/10    Time  6    Period  Weeks    Status  On-going      PT LONG TERM GOAL #7   Title  Patient will perform leg press on RLE alone with 40# for 10 reps to demonstrate partity with LLE and prevent long-term increased stress on LLE.    Baseline  RLE 20# x 10 reps; 09/01/2019: 20# x 10 reps    Time  6    Period  Weeks    Status  On-going            Plan - 09/22/19 1308    Clinical Impression Statement  Focused on addressing hip strengthening today as patient was having increased difficulty with walking secondary to hip compensatory strategies. Patient continues to have increased pain with weight bearing onto the affected LE, although this is improving overall as she is able to accept more challenge onto the affected LE. Patient will benefit from further skiled therapy to return to prior level of function.    Personal Factors and Comorbidities  Age;Comorbidity 3+;Fitness    Comorbidities  CAD, vertigo, HTN, L shoulder pain/weakness    Examination-Activity Limitations  Bathing;Locomotion Level;Bend;Squat;Stairs;Stand;Dressing    Examination-Participation Restrictions  Cleaning;Community  Activity;Other   Exercise   Stability/Clinical Decision Making  Evolving/Moderate complexity    Rehab Potential  Good    PT Frequency  2x / week    PT Duration  8 weeks    PT Treatment/Interventions  ADLs/Self Care Home Management;Aquatic Therapy;Cryotherapy;Electrical Stimulation;Moist Heat;DME Instruction;Gait training;Stair training;Functional mobility training;Therapeutic activities;Therapeutic exercise;Balance training;Neuromuscular re-education;Patient/family education;Manual techniques;Passive range of motion;Dry needling;Energy conservation;Taping;Joint Manipulations;Canalith Repostioning    PT Next Visit Plan  Quad therex against band, leg press; hip extension therex; STS; hip kickers    PT Home Exercise Plan  LAQ against green theraband, standing hip abd    Consulted and Agree with Plan of Care  Patient       Patient will benefit from skilled therapeutic intervention in order to improve the following deficits and impairments:  Abnormal gait, Decreased coordination, Decreased range of motion, Difficulty walking, Decreased endurance, Obesity, Decreased activity tolerance, Pain, Decreased balance, Hypomobility, Improper body mechanics, Impaired flexibility, Decreased mobility, Decreased strength, Increased edema, Postural dysfunction  Visit Diagnosis: Pain in right hip  Chronic pain of right knee     Problem List Patient Active Problem List   Diagnosis Date Noted  . Thrombocytopenia ([place]) 04/08/2019  . HTN (hypertension) 07/05/2018  . Tobacco use 07/05/2018  . S/P drug eluting coronary stent placement 03/15/2018  . Unstable angina ([place]) 02/24/2018  . NSTEMI (non-ST elevated myocardial  infarction) (HCC) 02/24/2018  . Full thickness rotator cuff tear 07/31/2017    Myrene Galas, PT DPT 09/22/2019, 1:46 PM  [facility] [address] [place], [address] Phone: [phone]   Fax:  [phone]  Name:  Karen Potts MRN: [id number] Date of Birth: 1960-11-28

## 2019-09-25 ENCOUNTER — Ambulatory Visit: Payer: Medicaid Other | Attending: Internal Medicine

## 2019-09-25 DIAGNOSIS — Z23 Encounter for immunization: Secondary | ICD-10-CM

## 2019-09-25 NOTE — Progress Notes (Signed)
   Covid-19 Vaccination Clinic  Name:  Karen Potts    MRN: [id number] DOB: [birth year]  09/25/2019  [name] was observed post Covid-19 immunization for 15 minutes without incident. She was provided with Vaccine Information Sheet and instruction to access the V-Safe system.   [name] was instructed to call 911 with any severe reactions post vaccine: Marland Kitchen Difficulty breathing  . Swelling of face and throat  . A fast heartbeat  . A bad rash all over body  . Dizziness and weakness   Immunizations Administered    Name Date Dose VIS Date Route   Pfizer COVID-19 Vaccine 09/25/2019  9:33 AM 0.3 mL 06/10/2019 Intramuscular   Manufacturer: [employer], [employer]   Lot: [id number]   NDC: [address]-9

## 2019-09-27 ENCOUNTER — Ambulatory Visit: Payer: Medicaid Other

## 2019-09-27 ENCOUNTER — Other Ambulatory Visit: Payer: Self-pay

## 2019-09-27 DIAGNOSIS — G8929 Other chronic pain: Secondary | ICD-10-CM

## 2019-09-27 DIAGNOSIS — M25551 Pain in right hip: Secondary | ICD-10-CM | POA: Diagnosis not present

## 2019-09-27 NOTE — Therapy (Signed)
[place] [facility] [address], [place], [address] Phone: [phone]   Fax:  [phone]  Physical Therapy Treatment  Patient Details  Name: Karen Potts MRN: [id number] Date of Birth: January 18, 1961 Referring Provider (PT): Gennette Pac MD   Encounter Date: 09/27/2019  PT End of Session - 09/27/19 1042    Visit Number  30    Number of Visits  40    Date for PT Re-Evaluation  10/25/19    Authorization Type  09/13/2019 - 10/24/2019 5/11 PT visits    PT Start Time  1030    PT Stop Time  1115    PT Time Calculation (min)  45 min    Equipment Utilized During Treatment  Gait belt    Activity Tolerance  Patient tolerated treatment well   Treatment limited secondary to vertigo   Behavior During Therapy  [facility] for tasks assessed/performed       Past Medical History:  Diagnosis Date  . Diabetes mellitus without complication ([place])   . Hypertension   . Sleep apnea   . Vertigo     Past Surgical History:  Procedure Laterality Date  . ABDOMINAL HYSTERECTOMY    . BREAST BIOPSY Left 10/2017    APOCRINE TYPE CYST WITH FIBROSIS Of the Wall  . CORONARY STENT INTERVENTION N/A 02/24/2018   Procedure: CORONARY STENT INTERVENTION;  Surgeon: Isaias Cowman, MD;  Location: [place] [facility];  Service: Cardiovascular;  Laterality: N/A;  . LEFT HEART CATH AND CORONARY ANGIOGRAPHY Right 02/24/2018   Procedure: LEFT HEART CATH AND CORONARY ANGIOGRAPHY;  Surgeon: Isaias Cowman, MD;  Location: [place] [facility];  Service: Cardiovascular;  Laterality: Right;  . SHOULDER ARTHROSCOPY WITH OPEN ROTATOR CUFF REPAIR Left 08/14/2017   Procedure: SHOULDER ARTHROSCOPY WITH ROTATOR CUFF REPAIR, BICEPS TENOTOMY, SUBACROMIAL DECOMPRESSION;  Surgeon: Lovell Sheehan, MD;  Location: [facility];  Service: Orthopedics;  Laterality: Left;  . UMBILICAL HERNIA REPAIR      There were no vitals filed for this visit.  Subjective  Assessment - 09/27/19 1040    Subjective  Patient states she continues to have increased R knee Potts, she states she fell yesterday from her knee "giving away".    Pertinent History  Onset [name] 2020 during treadmill training as part of cardiac rehabilitation. Cortisone shots provide relief for "a couple months"; last shot in June, relief until September. Attempting to increase exercise for weight loss and cardiac health but currently able to walk about "one block" before requiring rest due to knee Potts. Recently purchased [facility] for assistance with ambulation. Potts 5/10, 10/10 worst, 0/10 best, with Potts in R hip and LBP with increased activity. Agg: walking, standing. Ease: Voltaren, Aspirin, rest. Does not bother her in non-WB. Comorbidities: CAD, HTN, smoker, occasional vertigo "comes and goes every few months", DM2; L shoulder rotator cuff repair with fair recovery.    Limitations  Walking;Standing    How long can you sit comfortably?  unlimited    How long can you stand comfortably?  5 min    How long can you walk comfortably?  20 min    Diagnostic tests  x-ray (per pt report; the cartilage was "gone" on B knees)    Patient Stated Goals  walk with less Potts to lose weight    Currently in Potts?  No/denies    Potts Onset  More than a month ago          TREATMENT Therapeutic Exercise  Ambulation with focus on improving stride length and speed -- x 350ft with use of cane in the L UE Seated knee flexion/extension -- 2x 30 with ball Nustep in sitting with focusing on improving mobility of the knee -- x10 min level 1 with use of the arms Bridges in hooklying - 2 x 10  Hooklying hip ER - x10 with BTB Side stepping in standing with UE support - 2 x 10 B    Performed exercises to improve quad strength and quad activation on the affected side   PT Education - 09/27/19 1042    Education Details  form/technique with exercise    Person(s) Educated  Patient    Methods  Explanation;Demonstration     Comprehension  Verbalized understanding;Returned demonstration       PT Short Term Goals - 08/04/19 1416      PT SHORT TERM GOAL #1   Title  Patient will be independent with HEP as adjunct to clinical therapy and to reduce number of visits.    Baseline  HEP given    Time  2    Period  Weeks    Status  Achieved    Target Date  05/18/19      PT SHORT TERM GOAL #2   Title  Patient will demonstrate ability to perform correct gait pattern with SPC 100% of the time for improved energy conservation and functional capacity with ambulation and ADLs.    Baseline  Correct gait pattern 50% of the time    Time  2    Period  Weeks    Status  Achieved    Target Date  05/18/19      PT SHORT TERM GOAL #3   Title  Patient will report 50% or greater improvement in her symptoms of dizziness and imbalance with provoking motions/positions    Baseline  Vertigo 8/10; 08/04/2019: 90% improvement    Time  2    Period  Weeks    Status  Achieved    Target Date  05/30/19        PT Long Term Goals - 09/13/19 1628      PT LONG TERM GOAL #1   Title  Patient will improve speed to 0.8 m/sec to achieve community ambulator status and reduce fall risks.    Baseline  0.69 m/sec with SPC; 11/19 0.74; 12/21 deferred; 07/28/2019 0.878    Time  8    Period  Weeks    Status  Achieved      PT LONG TERM GOAL #2   Title  Patient will improve LEFS score by 9 points to achieve MCID for reduced disability and improved function.    Baseline  LEFS 25; 11/19 could not assess; 12/21 deferred for time; 07/28/2019 deferred for time; 09/01/2019: LEFS: 30    Time  8    Period  Weeks    Status  On-going      PT LONG TERM GOAL #3   Title  Patient will reduce worst Potts to 6/10 to demonstrate reduced disability with ADLs and improved functional capacity.    Baseline  10/10 worst Potts; 11/19 0/10; 12/21 5/10; 07/28/2019 2/10;   Potts 0/10 following cortisol injection   Time  8    Period  Weeks    Status  Achieved      PT  LONG TERM GOAL #4   Title  Patient will report walking tolerance of 45 minutes for for return to cardiac walking program.  Baseline  walking tolerance 20 min; 11/19 20 min secondary to vertigo; 12/21 20 min; 07/28/2019 cannot assess; 09/01/2019: 20 min    Time  8    Period  Weeks    Status  On-going      PT LONG TERM GOAL #5   Title  Patient will report no vertigo with provoking motions or positions in order to be able to perform ADLs and mobility tasks without symptoms.    Baseline  8/10 dizziness with provoking motions; 11/19 8/10    Time  4    Period  Weeks    Status  Achieved      PT LONG TERM GOAL #6   Title  Patient will reduce worst Potts to 0/10 with gait on stairs for reduced disability with gait.    Baseline  07/28/2019 2/10 on stairs; 09/01/2019: 8/10    Time  6    Period  Weeks    Status  On-going      PT LONG TERM GOAL #7   Title  Patient will perform leg press on RLE alone with 40# for 10 reps to demonstrate partity with LLE and prevent long-term increased stress on LLE.    Baseline  RLE 20# x 10 reps; 09/01/2019: 20# x 10 reps    Time  6    Period  Weeks    Status  On-going            Plan - 09/27/19 1052    Clinical Impression Statement  Focused on improving both quadriceps and glute during today's session. Patient able to walk 237ft which is improved compared to the 227ft during the session last visit indicating functional carryover between the session. Patient demonstrates poor strength along the quad and the hip along the muscular strength. Patient has increased weakness along the LE and the hip musculature and conitnued to focus on improving this limitation to return to prior level of function.    Personal Factors and Comorbidities  Age;Comorbidity 3+;Fitness    Comorbidities  CAD, vertigo, HTN, L shoulder Potts/weakness    Examination-Activity Limitations  Bathing;Locomotion Level;Bend;Squat;Stairs;Stand;Dressing    Examination-Participation Restrictions   Cleaning;Community Activity;Other   Exercise   Stability/Clinical Decision Making  Evolving/Moderate complexity    Rehab Potential  Good    PT Frequency  2x / week    PT Duration  8 weeks    PT Treatment/Interventions  ADLs/Self Care Home Management;Aquatic Therapy;Cryotherapy;Electrical Stimulation;Moist Heat;DME Instruction;Gait training;Stair training;Functional mobility training;Therapeutic activities;Therapeutic exercise;Balance training;Neuromuscular re-education;Patient/family education;Manual techniques;Passive range of motion;Dry needling;Energy conservation;Taping;Joint Manipulations;Canalith Repostioning    PT Next Visit Plan  Quad therex against band, leg press; hip extension therex; STS; hip kickers    PT Home Exercise Plan  LAQ against green theraband, standing hip abd    Consulted and Agree with Plan of Care  Patient       Patient will benefit from skilled therapeutic intervention in order to improve the following deficits and impairments:  Abnormal gait, Decreased coordination, Decreased range of motion, Difficulty walking, Decreased endurance, Obesity, Decreased activity tolerance, Potts, Decreased balance, Hypomobility, Improper body mechanics, Impaired flexibility, Decreased mobility, Decreased strength, Increased edema, Postural dysfunction  Visit Diagnosis: Chronic Potts of right knee  Potts in right hip     Problem List Patient Active Problem List   Diagnosis Date Noted  . Thrombocytopenia (HCC) 04/08/2019  . HTN (hypertension) 07/05/2018  . Tobacco use 07/05/2018  . S/P drug eluting coronary stent placement 03/15/2018  . Unstable angina (HCC) 02/24/2018  . NSTEMI (  non-ST elevated myocardial infarction) (HCC) 02/24/2018  . Full thickness rotator cuff tear 07/31/2017    Myrene Galas, PT DPT 09/27/2019, 11:27 AM  [place] [facility] [address], [place], [address] Phone: [phone]   Fax:   [phone]  Name: IVYLYNN HOPPES MRN: [id number] Date of Birth: Apr 17, 1961

## 2019-09-29 ENCOUNTER — Other Ambulatory Visit: Payer: Self-pay

## 2019-09-29 ENCOUNTER — Ambulatory Visit: Payer: Medicaid Other | Attending: Family Medicine

## 2019-09-29 DIAGNOSIS — M25551 Pain in right hip: Secondary | ICD-10-CM | POA: Insufficient documentation

## 2019-09-29 DIAGNOSIS — R2689 Other abnormalities of gait and mobility: Secondary | ICD-10-CM | POA: Diagnosis present

## 2019-09-29 DIAGNOSIS — R42 Dizziness and giddiness: Secondary | ICD-10-CM | POA: Insufficient documentation

## 2019-09-29 DIAGNOSIS — M25561 Pain in right knee: Secondary | ICD-10-CM | POA: Diagnosis present

## 2019-09-29 DIAGNOSIS — G8929 Other chronic pain: Secondary | ICD-10-CM | POA: Diagnosis present

## 2019-09-29 NOTE — Therapy (Signed)
[facility] [address], [place], [address] Phone: [phone]   Fax:  [phone]  Physical Therapy Treatment  Patient Details  Name: Karen Potts MRN: [id number] Date of Birth: [birth year] Referring Provider (PT): Toy Cookey MD   Encounter Date: 09/29/2019  PT End of Session - 09/29/19 1050    Visit Number  31    Number of Visits  40    Date for PT Re-Evaluation  10/25/19    Authorization Type  09/13/2019 - 10/24/2019 6/11 PT visits    PT Start Time  1015    PT Stop Time  1100    PT Time Calculation (min)  45 min    Equipment Utilized During Treatment  Gait belt    Activity Tolerance  Patient tolerated treatment well   Treatment limited secondary to vertigo   Behavior During Therapy  [facility] for tasks assessed/performed       Past Medical History:  Diagnosis Date  . Diabetes mellitus without complication ([facility])   . Hypertension   . Sleep apnea   . Vertigo     Past Surgical History:  Procedure Laterality Date  . ABDOMINAL HYSTERECTOMY    . BREAST BIOPSY Left 10/2017    APOCRINE TYPE CYST WITH FIBROSIS Of the Wall  . CORONARY STENT INTERVENTION N/A 02/24/2018   Procedure: CORONARY STENT INTERVENTION;  Surgeon: Marcina Millard, MD;  Location: [facility];  Service: Cardiovascular;  Laterality: N/A;  . LEFT HEART CATH AND CORONARY ANGIOGRAPHY Right 02/24/2018   Procedure: LEFT HEART CATH AND CORONARY ANGIOGRAPHY;  Surgeon: Marcina Millard, MD;  Location: [facility];  Service: Cardiovascular;  Laterality: Right;  . SHOULDER ARTHROSCOPY WITH OPEN ROTATOR CUFF REPAIR Left 08/14/2017   Procedure: SHOULDER ARTHROSCOPY WITH ROTATOR CUFF REPAIR, BICEPS TENOTOMY, SUBACROMIAL DECOMPRESSION;  Surgeon: Lyndle Herrlich, MD;  Location: [facility];  Service: Orthopedics;  Laterality: Left;  . UMBILICAL HERNIA REPAIR      There were no vitals filed for this visit.  Subjective  Assessment - 09/29/19 1024    Subjective  Patient reports increased R knee pain and states the weather has been a major contributer. Patient states she feels her knee was trobbing today.    Pertinent History  Onset [name] 2020 during treadmill training as part of cardiac rehabilitation. Cortisone shots provide relief for "a couple months"; last shot in June, relief until September. Attempting to increase exercise for weight loss and cardiac health but currently able to walk about "one block" before requiring rest due to knee pain. Recently purchased [facility] for assistance with ambulation. Pain 5/10, 10/10 worst, 0/10 best, with pain in R hip and LBP with increased activity. Agg: walking, standing. Ease: Voltaren, Aspirin, rest. Does not bother her in non-WB. Comorbidities: CAD, HTN, smoker, occasional vertigo "comes and goes every few months", DM2; L shoulder rotator cuff repair with fair recovery.    Limitations  Walking;Standing    How long can you sit comfortably?  unlimited    How long can you stand comfortably?  5 min    How long can you walk comfortably?  20 min    Diagnostic tests  x-ray (per pt report; the cartilage was "gone" on B knees)    Patient Stated Goals  walk with less pain to lose weight    Currently in Pain?  No/denies    Pain Onset  More than a month ago  TREATMENT Therapeutic Exercise  Ambulation with focus on improving stride length and speed -- x 276ft with use of cane in the L UE Seated knee flexion/extension -- 2x 30 with ball Seated resisted knee flexion in sitting - 2 x 10 YTB Nustep in sitting with focusing on improving mobility of the knee -- x10 min level 1 with use of the arms Bridges in hooklying - 2 x 10 with pushing out into belt Hooklying hip ER - x15 with BTB Side stepping in standing with UE support - 2 x 10 B  Leg Press on OMEGA - x 10 45#    Performed exercises to improve quad strength and quad activation on the affected side   PT Education -  09/29/19 1033    Education Details  form/technique with exercise    Person(s) Educated  Patient    Methods  Explanation;Demonstration    Comprehension  Verbalized understanding;Returned demonstration       PT Short Term Goals - 08/04/19 1416      PT SHORT TERM GOAL #1   Title  Patient will be independent with HEP as adjunct to clinical therapy and to reduce number of visits.    Baseline  HEP given    Time  2    Period  Weeks    Status  Achieved    Target Date  05/18/19      PT SHORT TERM GOAL #2   Title  Patient will demonstrate ability to perform correct gait pattern with SPC 100% of the time for improved energy conservation and functional capacity with ambulation and ADLs.    Baseline  Correct gait pattern 50% of the time    Time  2    Period  Weeks    Status  Achieved    Target Date  05/18/19      PT SHORT TERM GOAL #3   Title  Patient will report 50% or greater improvement in her symptoms of dizziness and imbalance with provoking motions/positions    Baseline  Vertigo 8/10; 08/04/2019: 90% improvement    Time  2    Period  Weeks    Status  Achieved    Target Date  05/30/19        PT Long Term Goals - 09/13/19 1628      PT LONG TERM GOAL #1   Title  Patient will improve speed to 0.8 m/sec to achieve community ambulator status and reduce fall risks.    Baseline  0.69 m/sec with SPC; 11/19 0.74; 12/21 deferred; 07/28/2019 0.878    Time  8    Period  Weeks    Status  Achieved      PT LONG TERM GOAL #2   Title  Patient will improve LEFS score by 9 points to achieve MCID for reduced disability and improved function.    Baseline  LEFS 25; 11/19 could not assess; 12/21 deferred for time; 07/28/2019 deferred for time; 09/01/2019: LEFS: 30    Time  8    Period  Weeks    Status  On-going      PT LONG TERM GOAL #3   Title  Patient will reduce worst pain to 6/10 to demonstrate reduced disability with ADLs and improved functional capacity.    Baseline  10/10 worst pain;  11/19 0/10; 12/21 5/10; 07/28/2019 2/10;   pain 0/10 following cortisol injection   Time  8    Period  Weeks    Status  Achieved  PT LONG TERM GOAL #4   Title  Patient will report walking tolerance of 45 minutes for for return to cardiac walking program.    Baseline  walking tolerance 20 min; 11/19 20 min secondary to vertigo; 12/21 20 min; 07/28/2019 cannot assess; 09/01/2019: 20 min    Time  8    Period  Weeks    Status  On-going      PT LONG TERM GOAL #5   Title  Patient will report no vertigo with provoking motions or positions in order to be able to perform ADLs and mobility tasks without symptoms.    Baseline  8/10 dizziness with provoking motions; 11/19 8/10    Time  4    Period  Weeks    Status  Achieved      PT LONG TERM GOAL #6   Title  Patient will reduce worst pain to 0/10 with gait on stairs for reduced disability with gait.    Baseline  07/28/2019 2/10 on stairs; 09/01/2019: 8/10    Time  6    Period  Weeks    Status  On-going      PT LONG TERM GOAL #7   Title  Patient will perform leg press on RLE alone with 40# for 10 reps to demonstrate partity with LLE and prevent long-term increased stress on LLE.    Baseline  RLE 20# x 10 reps; 09/01/2019: 20# x 10 reps    Time  6    Period  Weeks    Status  On-going            Plan - 09/29/19 1052    Clinical Impression Statement  Continued to focus on improving LE and hip strength with exercise and patient demonstrates increased pain today. Patient able to tolerate more activities compared to previous sessions, incorporating more hip based exercises into the session as she is having onset of hip pain during bouts of walking >250ft. Improvement with use of SPC in L UE compared to previous sessions. Patient will benefit from further skilled therapy focused on improving limitations to return to prior level of function.    Personal Factors and Comorbidities  Age;Comorbidity 3+;Fitness    Comorbidities  CAD, vertigo, HTN, L  shoulder pain/weakness    Examination-Activity Limitations  Bathing;Locomotion Level;Bend;Squat;Stairs;Stand;Dressing    Examination-Participation Restrictions  Cleaning;Community Activity;Other   Exercise   Stability/Clinical Decision Making  Evolving/Moderate complexity    Rehab Potential  Good    PT Frequency  2x / week    PT Duration  8 weeks    PT Treatment/Interventions  ADLs/Self Care Home Management;Aquatic Therapy;Cryotherapy;Electrical Stimulation;Moist Heat;DME Instruction;Gait training;Stair training;Functional mobility training;Therapeutic activities;Therapeutic exercise;Balance training;Neuromuscular re-education;Patient/family education;Manual techniques;Passive range of motion;Dry needling;Energy conservation;Taping;Joint Manipulations;Canalith Repostioning    PT Next Visit Plan  Quad therex against band, leg press; hip extension therex; STS; hip kickers    PT Home Exercise Plan  LAQ against green theraband, standing hip abd    Consulted and Agree with Plan of Care  Patient       Patient will benefit from skilled therapeutic intervention in order to improve the following deficits and impairments:  Abnormal gait, Decreased coordination, Decreased range of motion, Difficulty walking, Decreased endurance, Obesity, Decreased activity tolerance, Pain, Decreased balance, Hypomobility, Improper body mechanics, Impaired flexibility, Decreased mobility, Decreased strength, Increased edema, Postural dysfunction  Visit Diagnosis: Chronic pain of right knee  Pain in right hip     Problem List Patient Active Problem List   Diagnosis Date Noted  .  Thrombocytopenia (HCC) 04/08/2019  . HTN (hypertension) 07/05/2018  . Tobacco use 07/05/2018  . S/P drug eluting coronary stent placement 03/15/2018  . Unstable angina ([facility]) 02/24/2018  . NSTEMI (non-ST elevated myocardial infarction) ([facility]CC) 02/24/2018  . Full thickness rotator cuff tear 07/31/2017    Myrene Galas, PT DPT 09/29/2019,  11:18 AM  [facility] PHYSICAL AND SPORTS MEDICINE [address], [place], [address] Phone: [phone]   Fax:  [phone]  Name: KRISANDRA BUENO MRN: [id number] Date of Birth: 30-May-1961

## 2019-10-04 ENCOUNTER — Other Ambulatory Visit: Payer: Self-pay

## 2019-10-04 ENCOUNTER — Ambulatory Visit: Payer: Medicaid Other

## 2019-10-04 DIAGNOSIS — G8929 Other chronic pain: Secondary | ICD-10-CM

## 2019-10-04 DIAGNOSIS — M25551 Pain in right hip: Secondary | ICD-10-CM

## 2019-10-04 DIAGNOSIS — M25561 Pain in right knee: Secondary | ICD-10-CM | POA: Diagnosis not present

## 2019-10-05 NOTE — Therapy (Signed)
[facility] [address], [place], [address] Phone: [phone]   Fax:  [phone]  Physical Therapy Treatment  Patient Details  Name: Karen Potts MRN: [id number] Date of Birth: [birth year] Referring Provider (PT): Gennette Pac MD   Encounter Date: 10/04/2019  PT End of Session - 10/04/19 1126    Visit Number  32    Number of Visits  40    Date for PT Re-Evaluation  10/25/19    Authorization Type  09/13/2019 - 10/24/2019 6/11 PT visits    PT Start Time  1115    PT Stop Time  1200    PT Time Calculation (min)  45 min    Equipment Utilized During Treatment  Gait belt    Activity Tolerance  Patient tolerated treatment well   Treatment limited secondary to vertigo   Behavior During Therapy  [facility] for tasks assessed/performed       Past Medical History:  Diagnosis Date  . Diabetes mellitus without complication ([place])   . Hypertension   . Sleep apnea   . Vertigo     Past Surgical History:  Procedure Laterality Date  . ABDOMINAL HYSTERECTOMY    . BREAST BIOPSY Left 10/2017    APOCRINE TYPE CYST WITH FIBROSIS Of the Wall  . CORONARY STENT INTERVENTION N/A 02/24/2018   Procedure: CORONARY STENT INTERVENTION;  Surgeon: Isaias Cowman, MD;  Location: [facility];  Service: Cardiovascular;  Laterality: N/A;  . LEFT HEART CATH AND CORONARY ANGIOGRAPHY Right 02/24/2018   Procedure: LEFT HEART CATH AND CORONARY ANGIOGRAPHY;  Surgeon: Isaias Cowman, MD;  Location: [facility];  Service: Cardiovascular;  Laterality: Right;  . SHOULDER ARTHROSCOPY WITH OPEN ROTATOR CUFF REPAIR Left 08/14/2017   Procedure: SHOULDER ARTHROSCOPY WITH ROTATOR CUFF REPAIR, BICEPS TENOTOMY, SUBACROMIAL DECOMPRESSION;  Surgeon: Lovell Sheehan, MD;  Location: [facility];  Service: Orthopedics;  Laterality: Left;  . UMBILICAL HERNIA REPAIR      There were no vitals filed for this visit.  Subjective  Assessment - 10/04/19 1121    Subjective  Patient states her knee has been bothering her this weekend. Patient states increased knee pain currently.    Pertinent History  Onset [name] 2020 during treadmill training as part of cardiac rehabilitation. Cortisone shots provide relief for "a couple months"; last shot in June, relief until September. Attempting to increase exercise for weight loss and cardiac health but currently able to walk about "one block" before requiring rest due to knee pain. Recently purchased [facility] for assistance with ambulation. Pain 5/10, 10/10 worst, 0/10 best, with pain in R hip and LBP with increased activity. Agg: walking, standing. Ease: Voltaren, Aspirin, rest. Does not bother her in non-WB. Comorbidities: CAD, HTN, smoker, occasional vertigo "comes and goes every few months", DM2; L shoulder rotator cuff repair with fair recovery.    Limitations  Walking;Standing    How long can you sit comfortably?  unlimited    How long can you stand comfortably?  5 min    How long can you walk comfortably?  20 min    Diagnostic tests  x-ray (per pt report; the cartilage was "gone" on B knees)    Patient Stated Goals  walk with less pain to lose weight    Currently in Pain?  Yes    Pain Score  6     Pain Location  Knee    Pain Orientation  Right    Pain Descriptors /  Indicators  Aching    Pain Type  Chronic pain    Pain Onset  More than a month ago    Pain Frequency  Intermittent            TREATMENT Therapeutic Exercise  Nustep in sitting with focusing on improving mobility of the knee -- x10 min level 2 with use of the arms with focus on improving speed with movement Ambulation with focus on improving stride length and speed -- x 155ft with use of cane in the L UE Knee flexion/extension with 3kg  -- 3 x 30  Hip abduction in standing - x 10   Quad sets in sitting with straight leg - 3 x 30 with 3 sec holds  Modalities  High volt  2-channel , with 4 large 2x4 pads  places along the borders of the affected knee with patient positioned in sitting to decrease increased pain and improve knee bending without pain response. While performing this, patient performed both quad sets and knee flexion/extension in sitting   Performed exercises to  improve quad strength and quad activation on the affected side    PT Education - 10/04/19 1126    Education Details  form/technique with exercise    Person(s) Educated  Patient    Methods  Explanation;Demonstration    Comprehension  Verbalized understanding;Returned demonstration       PT Short Term Goals - 08/04/19 1416      PT SHORT TERM GOAL #1   Title  Patient will be independent with HEP as adjunct to clinical therapy and to reduce number of visits.    Baseline  HEP given    Time  2    Period  Weeks    Status  Achieved    Target Date  05/18/19      PT SHORT TERM GOAL #2   Title  Patient will demonstrate ability to perform correct gait pattern with SPC 100% of the time for improved energy conservation and functional capacity with ambulation and ADLs.    Baseline  Correct gait pattern 50% of the time    Time  2    Period  Weeks    Status  Achieved    Target Date  05/18/19      PT SHORT TERM GOAL #3   Title  Patient will report 50% or greater improvement in her symptoms of dizziness and imbalance with provoking motions/positions    Baseline  Vertigo 8/10; 08/04/2019: 90% improvement    Time  2    Period  Weeks    Status  Achieved    Target Date  05/30/19        PT Long Term Goals - 09/13/19 1628      PT LONG TERM GOAL #1   Title  Patient will improve speed to 0.8 m/sec to achieve community ambulator status and reduce fall risks.    Baseline  0.69 m/sec with SPC; 11/19 0.74; 12/21 deferred; 07/28/2019 0.878    Time  8    Period  Weeks    Status  Achieved      PT LONG TERM GOAL #2   Title  Patient will improve LEFS score by 9 points to achieve MCID for reduced disability and improved  function.    Baseline  LEFS 25; 11/19 could not assess; 12/21 deferred for time; 07/28/2019 deferred for time; 09/01/2019: LEFS: 30    Time  8    Period  Weeks    Status  On-going  PT LONG TERM GOAL #3   Title  Patient will reduce worst pain to 6/10 to demonstrate reduced disability with ADLs and improved functional capacity.    Baseline  10/10 worst pain; 11/19 0/10; 12/21 5/10; 07/28/2019 2/10;   pain 0/10 following cortisol injection   Time  8    Period  Weeks    Status  Achieved      PT LONG TERM GOAL #4   Title  Patient will report walking tolerance of 45 minutes for for return to cardiac walking program.    Baseline  walking tolerance 20 min; 11/19 20 min secondary to vertigo; 12/21 20 min; 07/28/2019 cannot assess; 09/01/2019: 20 min    Time  8    Period  Weeks    Status  On-going      PT LONG TERM GOAL #5   Title  Patient will report no vertigo with provoking motions or positions in order to be able to perform ADLs and mobility tasks without symptoms.    Baseline  8/10 dizziness with provoking motions; 11/19 8/10    Time  4    Period  Weeks    Status  Achieved      PT LONG TERM GOAL #6   Title  Patient will reduce worst pain to 0/10 with gait on stairs for reduced disability with gait.    Baseline  07/28/2019 2/10 on stairs; 09/01/2019: 8/10    Time  6    Period  Weeks    Status  On-going      PT LONG TERM GOAL #7   Title  Patient will perform leg press on RLE alone with 40# for 10 reps to demonstrate partity with LLE and prevent long-term increased stress on LLE.    Baseline  RLE 20# x 10 reps; 09/01/2019: 20# x 10 reps    Time  6    Period  Weeks    Status  On-going            Plan - 10/05/19 0930    Clinical Impression Statement  Patient with increased pain during today's session that was increased with weight bearing today. Patient demonstrates minimal improvement after performing High Volt stimulation over the affected area, however she continues to have  increased pain at the end of session. May need to change parameters or perform while doing exercises to help minimize pain. Patient will benefit from further skilled therapy focused on improving LE strength while not increasing pain to return to prior level of function.    Personal Factors and Comorbidities  Age;Comorbidity 3+;Fitness    Comorbidities  CAD, vertigo, HTN, L shoulder pain/weakness    Examination-Activity Limitations  Bathing;Locomotion Level;Bend;Squat;Stairs;Stand;Dressing    Examination-Participation Restrictions  Cleaning;Community Activity;Other   Exercise   Stability/Clinical Decision Making  Evolving/Moderate complexity    Rehab Potential  Good    PT Frequency  2x / week    PT Duration  8 weeks    PT Treatment/Interventions  ADLs/Self Care Home Management;Aquatic Therapy;Cryotherapy;Electrical Stimulation;Moist Heat;DME Instruction;Gait training;Stair training;Functional mobility training;Therapeutic activities;Therapeutic exercise;Balance training;Neuromuscular re-education;Patient/family education;Manual techniques;Passive range of motion;Dry needling;Energy conservation;Taping;Joint Manipulations;Canalith Repostioning    PT Next Visit Plan  Quad therex against band, leg press; hip extension therex; STS; hip kickers    PT Home Exercise Plan  LAQ against green theraband, standing hip abd    Consulted and Agree with Plan of Care  Patient       Patient will benefit from skilled therapeutic intervention in order to improve the  following deficits and impairments:  Abnormal gait, Decreased coordination, Decreased range of motion, Difficulty walking, Decreased endurance, Obesity, Decreased activity tolerance, Pain, Decreased balance, Hypomobility, Improper body mechanics, Impaired flexibility, Decreased mobility, Decreased strength, Increased edema, Postural dysfunction  Visit Diagnosis: Chronic pain of right knee  Pain in right hip     Problem List Patient Active Problem  List   Diagnosis Date Noted  . Thrombocytopenia (HCC) 04/08/2019  . HTN (hypertension) 07/05/2018  . Tobacco use 07/05/2018  . S/P drug eluting coronary stent placement 03/15/2018  . Unstable angina (HCC) 02/24/2018  . NSTEMI (non-ST elevated myocardial infarction) (HCC) 02/24/2018  . Full thickness rotator cuff tear 07/31/2017    Myrene Galas, PT DPT 10/05/2019, 9:34 AM   [facility] [address] [place], [address] Phone: [phone]   Fax:  [phone]  Name: Karen Potts MRN: [id number] Date of Birth: 26-[name]-1961

## 2019-10-06 ENCOUNTER — Ambulatory Visit: Payer: Medicaid Other

## 2019-10-06 ENCOUNTER — Other Ambulatory Visit: Payer: Self-pay

## 2019-10-06 DIAGNOSIS — G8929 Other chronic pain: Secondary | ICD-10-CM

## 2019-10-06 DIAGNOSIS — M25561 Pain in right knee: Secondary | ICD-10-CM | POA: Diagnosis not present

## 2019-10-06 DIAGNOSIS — M25551 Pain in right hip: Secondary | ICD-10-CM

## 2019-10-06 NOTE — Therapy (Signed)
[facility] [address], [place], [address] Phone: [phone]   Fax:  [phone]  Physical Therapy Treatment  Patient Details  Name: Karen Potts MRN: [id number] Date of Birth: [birth year] Referring Provider (PT): Toy Cookey MD   Encounter Date: 10/06/2019  PT End of Session - 10/06/19 1006    Visit Number  33    Number of Visits  40    Date for PT Re-Evaluation  10/25/19    Authorization Type  09/13/2019 - 10/24/2019 6/11 PT visits    PT Start Time  0945    PT Stop Time  1030    PT Time Calculation (min)  45 min    Equipment Utilized During Treatment  Gait belt    Activity Tolerance  Patient tolerated treatment well   Treatment limited secondary to vertigo   Behavior During Therapy  [facility] for tasks assessed/performed       Past Medical History:  Diagnosis Date  . Diabetes mellitus without complication (HCC)   . Hypertension   . Sleep apnea   . Vertigo     Past Surgical History:  Procedure Laterality Date  . ABDOMINAL HYSTERECTOMY    . BREAST BIOPSY Left 10/2017    APOCRINE TYPE CYST WITH FIBROSIS Of the Wall  . CORONARY STENT INTERVENTION N/A 02/24/2018   Procedure: CORONARY STENT INTERVENTION;  Surgeon: Marcina Millard, MD;  Location: [facility];  Service: Cardiovascular;  Laterality: N/A;  . LEFT HEART CATH AND CORONARY ANGIOGRAPHY Right 02/24/2018   Procedure: LEFT HEART CATH AND CORONARY ANGIOGRAPHY;  Surgeon: Marcina Millard, MD;  Location: [facility];  Service: Cardiovascular;  Laterality: Right;  . SHOULDER ARTHROSCOPY WITH OPEN ROTATOR CUFF REPAIR Left 08/14/2017   Procedure: SHOULDER ARTHROSCOPY WITH ROTATOR CUFF REPAIR, BICEPS TENOTOMY, SUBACROMIAL DECOMPRESSION;  Surgeon: Lyndle Herrlich, MD;  Location: [facility];  Service: Orthopedics;  Laterality: Left;  . UMBILICAL HERNIA REPAIR      There were no vitals filed for this visit.  Subjective  Assessment - 10/06/19 0959    Subjective  Patient reports her knee is feeling better today than she did the previous session. Patient states improvement overall and was able to walk yesterday with her sister for 15 min.    Pertinent History  Onset [name] 2020 during treadmill training as part of cardiac rehabilitation. Cortisone shots provide relief for "a couple months"; last shot in June, relief until September. Attempting to increase exercise for weight loss and cardiac health but currently able to walk about "one block" before requiring rest due to knee pain. Recently purchased [facility] for assistance with ambulation. Pain 5/10, 10/10 worst, 0/10 best, with pain in R hip and LBP with increased activity. Agg: walking, standing. Ease: Voltaren, Aspirin, rest. Does not bother her in non-WB. Comorbidities: CAD, HTN, smoker, occasional vertigo "comes and goes every few months", DM2; L shoulder rotator cuff repair with fair recovery.    Limitations  Walking;Standing    How long can you sit comfortably?  unlimited    How long can you stand comfortably?  5 min    How long can you walk comfortably?  20 min    Diagnostic tests  x-ray (per pt report; the cartilage was "gone" on B knees)    Patient Stated Goals  walk with less pain to lose weight    Currently in Pain?  No/denies    Pain Location  --    Pain Orientation  --  Pain Descriptors / Indicators  --    Pain Type  --    Pain Onset  --    Pain Frequency  --         TREATMENT Therapeutic Exercise  Nustep in sitting with focusing on improving mobility of the knee -- x10 min level 2 with use of the arms with focus on improving speed with movement Knee flexion/extension with 3kg ball  -- x25 Hip extension in standing with assistance  Hip abduction in standing - 3 x 5 Leg Press - x 20 B 45#; x 10 unilaterally on the R 10# SAQ/Quad sets in prone with straight leg - x20 with 3 sec holds   Ambulation with focus on improving stride length and speed -- x  138ft with use of cane in the L UE        PT Education - 10/06/19 1005    Education Details  form/technique with exercise    Person(s) Educated  Patient    Methods  Explanation;Demonstration    Comprehension  Verbalized understanding;Returned demonstration       PT Short Term Goals - 08/04/19 1416      PT SHORT TERM GOAL #1   Title  Patient will be independent with HEP as adjunct to clinical therapy and to reduce number of visits.    Baseline  HEP given    Time  2    Period  Weeks    Status  Achieved    Target Date  05/18/19      PT SHORT TERM GOAL #2   Title  Patient will demonstrate ability to perform correct gait pattern with SPC 100% of the time for improved energy conservation and functional capacity with ambulation and ADLs.    Baseline  Correct gait pattern 50% of the time    Time  2    Period  Weeks    Status  Achieved    Target Date  05/18/19      PT SHORT TERM GOAL #3   Title  Patient will report 50% or greater improvement in her symptoms of dizziness and imbalance with provoking motions/positions    Baseline  Vertigo 8/10; 08/04/2019: 90% improvement    Time  2    Period  Weeks    Status  Achieved    Target Date  05/30/19        PT Long Term Goals - 09/13/19 1628      PT LONG TERM GOAL #1   Title  Patient will improve 10MW speed to 0.8 m/sec to achieve community ambulator status and reduce fall risks.    Baseline  0.69 m/sec with SPC; 11/19 0.74; 12/21 deferred; 07/28/2019 0.878    Time  8    Period  Weeks    Status  Achieved      PT LONG TERM GOAL #2   Title  Patient will improve LEFS score by 9 points to achieve MCID for reduced disability and improved function.    Baseline  LEFS 25; 11/19 could not assess; 12/21 deferred for time; 07/28/2019 deferred for time; 09/01/2019: LEFS: 30    Time  8    Period  Weeks    Status  On-going      PT LONG TERM GOAL #3   Title  Patient will reduce worst pain to 6/10 to demonstrate reduced disability with ADLs and  improved functional capacity.    Baseline  10/10 worst pain; 11/19 0/10; 12/21 5/10; 07/28/2019 2/10;   pain 0/10  following cortisol injection   Time  8    Period  Weeks    Status  Achieved      PT LONG TERM GOAL #4   Title  Patient will report walking tolerance of 45 minutes for for return to cardiac walking program.    Baseline  walking tolerance 20 min; 11/19 20 min secondary to vertigo; 12/21 20 min; 07/28/2019 cannot assess; 09/01/2019: 20 min    Time  8    Period  Weeks    Status  On-going      PT LONG TERM GOAL #5   Title  Patient will report no vertigo with provoking motions or positions in order to be able to perform ADLs and mobility tasks without symptoms.    Baseline  8/10 dizziness with provoking motions; 11/19 8/10    Time  4    Period  Weeks    Status  Achieved      PT LONG TERM GOAL #6   Title  Patient will reduce worst pain to 0/10 with gait on stairs for reduced disability with gait.    Baseline  07/28/2019 2/10 on stairs; 09/01/2019: 8/10    Time  6    Period  Weeks    Status  On-going      PT LONG TERM GOAL #7   Title  Patient will perform leg press on RLE alone with 40# for 10 reps to demonstrate partity with LLE and prevent long-term increased stress on LLE.    Baseline  RLE 20# x 10 reps; 09/01/2019: 20# x 10 reps    Time  6    Period  Weeks    Status  On-going            Plan - 10/06/19 1019    Clinical Impression Statement  Focused on improving hip strengthening extension/abduction today secondary to patient having increased pain with performing prolonged walking. Patient able to tolerate more exercises however continues to have limitations in the hip, limiting ability to activate quad activation. Patient is improving overall and will benefit from fruther skilled therapy focused on improivng quad strengthening to decrease increased spasms and pain.    Personal Factors and Comorbidities  Age;Comorbidity 3+;Fitness    Comorbidities  CAD, vertigo, HTN, L  shoulder pain/weakness    Examination-Activity Limitations  Bathing;Locomotion Level;Bend;Squat;Stairs;Stand;Dressing    Examination-Participation Restrictions  Cleaning;Community Activity;Other   Exercise   Stability/Clinical Decision Making  Evolving/Moderate complexity    Rehab Potential  Good    PT Frequency  2x / week    PT Duration  8 weeks    PT Treatment/Interventions  ADLs/Self Care Home Management;Aquatic Therapy;Cryotherapy;Electrical Stimulation;Moist Heat;DME Instruction;Gait training;Stair training;Functional mobility training;Therapeutic activities;Therapeutic exercise;Balance training;Neuromuscular re-education;Patient/family education;Manual techniques;Passive range of motion;Dry needling;Energy conservation;Taping;Joint Manipulations;Canalith Repostioning    PT Next Visit Plan  Quad therex against band, leg press; hip extension therex; STS; hip kickers    PT Home Exercise Plan  LAQ against green theraband, standing hip abd    Consulted and Agree with Plan of Care  Patient       Patient will benefit from skilled therapeutic intervention in order to improve the following deficits and impairments:  Abnormal gait, Decreased coordination, Decreased range of motion, Difficulty walking, Decreased endurance, Obesity, Decreased activity tolerance, Pain, Decreased balance, Hypomobility, Improper body mechanics, Impaired flexibility, Decreased mobility, Decreased strength, Increased edema, Postural dysfunction  Visit Diagnosis: Chronic pain of right knee  Pain in right hip     Problem List Patient Active Problem List  Diagnosis Date Noted  . Thrombocytopenia (HCC) 04/08/2019  . HTN (hypertension) 07/05/2018  . Tobacco use 07/05/2018  . S/P drug eluting coronary stent placement 03/15/2018  . Unstable angina (HCC) 02/24/2018  . NSTEMI (non-ST elevated myocardial infarction) (HCC) 02/24/2018  . Full thickness rotator cuff tear 07/31/2017    Myrene Galas, PT DPT 10/06/2019,  10:43 AM  [facility] [address], [place], [address] Phone: [phone]   Fax:  [phone]  Name: TAALIYAH DELPRIORE MRN: [id number] Date of Birth: 19-May-1961

## 2019-10-11 ENCOUNTER — Ambulatory Visit: Payer: Medicaid Other

## 2019-10-11 ENCOUNTER — Other Ambulatory Visit: Payer: Self-pay

## 2019-10-11 DIAGNOSIS — M25561 Pain in right knee: Secondary | ICD-10-CM | POA: Diagnosis not present

## 2019-10-11 DIAGNOSIS — G8929 Other chronic pain: Secondary | ICD-10-CM

## 2019-10-11 DIAGNOSIS — M25551 Pain in right hip: Secondary | ICD-10-CM

## 2019-10-12 NOTE — Therapy (Signed)
[facility] [address] [place], [address] Phone: [phone]   Fax:  [phone]  Physical Therapy Treatment  Patient Details  Name: Karen Potts MRN: [id number] Date of Birth: [birth year] Referring Provider (PT): Gennette Pac MD   Encounter Date: 10/11/2019  PT End of Session - 10/11/19 1109    Visit Number  34    Number of Visits  40    Date for PT Re-Evaluation  10/25/19    Authorization Type  09/13/2019 - 10/24/2019 7/11 PT visits    PT Start Time  1030    PT Stop Time  1115    PT Time Calculation (min)  45 min    Equipment Utilized During Treatment  Gait belt    Activity Tolerance  Patient tolerated treatment well   Treatment limited secondary to vertigo   Behavior During Therapy  [facility] for tasks assessed/performed       Past Medical History:  Diagnosis Date  . Diabetes mellitus without complication ([place])   . Hypertension   . Sleep apnea   . Vertigo     Past Surgical History:  Procedure Laterality Date  . ABDOMINAL HYSTERECTOMY    . BREAST BIOPSY Left 10/2017    APOCRINE TYPE CYST WITH FIBROSIS Of the Wall  . CORONARY STENT INTERVENTION N/A 02/24/2018   Procedure: CORONARY STENT INTERVENTION;  Surgeon: Isaias Cowman, MD;  Location: [place] [facility];  Service: Cardiovascular;  Laterality: N/A;  . LEFT HEART CATH AND CORONARY ANGIOGRAPHY Right 02/24/2018   Procedure: LEFT HEART CATH AND CORONARY ANGIOGRAPHY;  Surgeon: Isaias Cowman, MD;  Location: [place] [facility];  Service: Cardiovascular;  Laterality: Right;  . SHOULDER ARTHROSCOPY WITH OPEN ROTATOR CUFF REPAIR Left 08/14/2017   Procedure: SHOULDER ARTHROSCOPY WITH ROTATOR CUFF REPAIR, BICEPS TENOTOMY, SUBACROMIAL DECOMPRESSION;  Surgeon: Lovell Sheehan, MD;  Location: [facility];  Service: Orthopedics;  Laterality: Left;  . UMBILICAL HERNIA REPAIR      There were no vitals filed for this visit.  Subjective  Assessment - 10/11/19 1043    Subjective  Patient states increased knee pain today. Patient states she had less pain over the weekend but increased pain today.    Pertinent History  Onset [name] 2020 during treadmill training as part of cardiac rehabilitation. Cortisone shots provide relief for "a couple months"; last shot in June, relief until September. Attempting to increase exercise for weight loss and cardiac health but currently able to walk about "one block" before requiring rest due to knee pain. Recently purchased [facility] for assistance with ambulation. Pain 5/10, 10/10 worst, 0/10 best, with pain in R hip and LBP with increased activity. Agg: walking, standing. Ease: Voltaren, Aspirin, rest. Does not bother her in non-WB. Comorbidities: CAD, HTN, smoker, occasional vertigo "comes and goes every few months", DM2; L shoulder rotator cuff repair with fair recovery.    Limitations  Walking;Standing    How long can you sit comfortably?  unlimited    How long can you stand comfortably?  5 min    How long can you walk comfortably?  20 min    Diagnostic tests  x-ray (per pt report; the cartilage was "gone" on B knees)    Patient Stated Goals  walk with less pain to lose weight    Currently in Pain?  Yes    Pain Score  7     Pain Location  Knee    Pain Orientation  Right  Pain Descriptors / Indicators  Aching    Pain Type  Chronic pain    Pain Onset  More than a month ago    Pain Frequency  Intermittent          TREATMENT Therapeutic Exercise  Nustep in sitting with focusing on improving mobility of the knee -- x10 min level 2 with use of the arms with focus on improving speed with movement  Ambulation with focus on improving stride length and speed -- x 247ft with use of cane in the L UE Sidelying Hip ER on the R side - x 10  Seated Hip ER on the R side - x 15 Seated hip abduction in standing with SLR elevated - x 10   Manual therapy Patellar mobilizations with patient positioned in long  sitting; performed in all directions inf/sup/laterally/medially to improve patellar movement in standing - 2 x 1 min per direction  Performed exercises to improve strengthening     PT Education - 10/11/19 1106    Education Details  form /technique with exercise; hip ER in sitting and sidelying    Person(s) Educated  Patient    Methods  Explanation;Demonstration    Comprehension  Verbalized understanding;Returned demonstration       PT Short Term Goals - 08/04/19 1416      PT SHORT TERM GOAL #1   Title  Patient will be independent with HEP as adjunct to clinical therapy and to reduce number of visits.    Baseline  HEP given    Time  2    Period  Weeks    Status  Achieved    Target Date  05/18/19      PT SHORT TERM GOAL #2   Title  Patient will demonstrate ability to perform correct gait pattern with SPC 100% of the time for improved energy conservation and functional capacity with ambulation and ADLs.    Baseline  Correct gait pattern 50% of the time    Time  2    Period  Weeks    Status  Achieved    Target Date  05/18/19      PT SHORT TERM GOAL #3   Title  Patient will report 50% or greater improvement in her symptoms of dizziness and imbalance with provoking motions/positions    Baseline  Vertigo 8/10; 08/04/2019: 90% improvement    Time  2    Period  Weeks    Status  Achieved    Target Date  05/30/19        PT Long Term Goals - 09/13/19 1628      PT LONG TERM GOAL #1   Title  Patient will improve speed to 0.8 m/sec to achieve community ambulator status and reduce fall risks.    Baseline  0.69 m/sec with SPC; 11/19 0.74; 12/21 deferred; 07/28/2019 0.878    Time  8    Period  Weeks    Status  Achieved      PT LONG TERM GOAL #2   Title  Patient will improve LEFS score by 9 points to achieve MCID for reduced disability and improved function.    Baseline  LEFS 25; 11/19 could not assess; 12/21 deferred for time; 07/28/2019 deferred for time; 09/01/2019: LEFS: 30     Time  8    Period  Weeks    Status  On-going      PT LONG TERM GOAL #3   Title  Patient will reduce worst pain to 6/10 to  demonstrate reduced disability with ADLs and improved functional capacity.    Baseline  10/10 worst pain; 11/19 0/10; 12/21 5/10; 07/28/2019 2/10;   pain 0/10 following cortisol injection   Time  8    Period  Weeks    Status  Achieved      PT LONG TERM GOAL #4   Title  Patient will report walking tolerance of 45 minutes for for return to cardiac walking program.    Baseline  walking tolerance 20 min; 11/19 20 min secondary to vertigo; 12/21 20 min; 07/28/2019 cannot assess; 09/01/2019: 20 min    Time  8    Period  Weeks    Status  On-going      PT LONG TERM GOAL #5   Title  Patient will report no vertigo with provoking motions or positions in order to be able to perform ADLs and mobility tasks without symptoms.    Baseline  8/10 dizziness with provoking motions; 11/19 8/10    Time  4    Period  Weeks    Status  Achieved      PT LONG TERM GOAL #6   Title  Patient will reduce worst pain to 0/10 with gait on stairs for reduced disability with gait.    Baseline  07/28/2019 2/10 on stairs; 09/01/2019: 8/10    Time  6    Period  Weeks    Status  On-going      PT LONG TERM GOAL #7   Title  Patient will perform leg press on RLE alone with 40# for 10 reps to demonstrate partity with LLE and prevent long-term increased stress on LLE.    Baseline  RLE 20# x 10 reps; 09/01/2019: 20# x 10 reps    Time  6    Period  Weeks    Status  On-going            Plan - 10/11/19 1111    Clinical Impression Statement  Patient with decreased patellar mobilization which causes an increase in pain with standing and walking. This is improved with performing manual therapy to improve pateallar mobilization. Patient continues to have increased difficulties with loading onto the affected side, this is most likley due to hip weakness on the affected side (as she hd great difficutlty with  performing hip ER). Patient will benefit from further skilled therapy to return to prior level of function.    Personal Factors and Comorbidities  Age;Comorbidity 3+;Fitness    Comorbidities  CAD, vertigo, HTN, L shoulder pain/weakness    Examination-Activity Limitations  Bathing;Locomotion Level;Bend;Squat;Stairs;Stand;Dressing    Examination-Participation Restrictions  Cleaning;Community Activity;Other   Exercise   Stability/Clinical Decision Making  Evolving/Moderate complexity    Rehab Potential  Good    PT Frequency  2x / week    PT Duration  8 weeks    PT Treatment/Interventions  ADLs/Self Care Home Management;Aquatic Therapy;Cryotherapy;Electrical Stimulation;Moist Heat;DME Instruction;Gait training;Stair training;Functional mobility training;Therapeutic activities;Therapeutic exercise;Balance training;Neuromuscular re-education;Patient/family education;Manual techniques;Passive range of motion;Dry needling;Energy conservation;Taping;Joint Manipulations;Canalith Repostioning    PT Next Visit Plan  Quad therex against band, leg press; hip extension therex; STS; hip kickers    PT Home Exercise Plan  LAQ against green theraband, standing hip abd    Consulted and Agree with Plan of Care  Patient       Patient will benefit from skilled therapeutic intervention in order to improve the following deficits and impairments:  Abnormal gait, Decreased coordination, Decreased range of motion, Difficulty walking, Decreased endurance, Obesity, Decreased activity  tolerance, Pain, Decreased balance, Hypomobility, Improper body mechanics, Impaired flexibility, Decreased mobility, Decreased strength, Increased edema, Postural dysfunction  Visit Diagnosis: Chronic pain of right knee  Pain in right hip     Problem List Patient Active Problem List   Diagnosis Date Noted  . Thrombocytopenia (HCC) 04/08/2019  . HTN (hypertension) 07/05/2018  . Tobacco use 07/05/2018  . S/P drug eluting coronary stent  placement 03/15/2018  . Unstable angina (HCC) 02/24/2018  . NSTEMI (non-ST elevated myocardial infarction) (HCC) 02/24/2018  . Full thickness rotator cuff tear 07/31/2017    Myrene Galas, PT DPT 10/12/2019, 9:42 AM  [facility] [address] [place], [address] Phone: [phone]   Fax:  [phone]  Name: ZHANE DONLAN MRN: [id number] Date of Birth: 1960/11/28

## 2019-10-13 ENCOUNTER — Ambulatory Visit: Payer: Medicaid Other

## 2019-10-13 ENCOUNTER — Other Ambulatory Visit: Payer: Self-pay

## 2019-10-13 DIAGNOSIS — M25561 Pain in right knee: Secondary | ICD-10-CM | POA: Diagnosis not present

## 2019-10-13 DIAGNOSIS — G8929 Other chronic pain: Secondary | ICD-10-CM

## 2019-10-13 DIAGNOSIS — M25551 Pain in right hip: Secondary | ICD-10-CM

## 2019-10-13 NOTE — Therapy (Signed)
[facility] [address] [place], [address] Phone: [phone]   Fax:  [phone]  Physical Therapy Treatment  Patient Details  Name: Karen Potts MRN: [id number] Date of Birth: [birth year] Referring Provider (PT): Toy Cookey MD   Encounter Date: 10/13/2019  PT End of Session - 10/13/19 1008    Visit Number  35    Number of Visits  40    Date for PT Re-Evaluation  10/25/19    Authorization Type  09/13/2019 - 10/24/2019 7/11 PT visits    PT Start Time  0945    PT Stop Time  1030    PT Time Calculation (min)  45 min    Equipment Utilized During Treatment  Gait belt    Activity Tolerance  Patient tolerated treatment well   Treatment limited secondary to vertigo   Behavior During Therapy  [facility] for tasks assessed/performed       Past Medical History:  Diagnosis Date  . Diabetes mellitus without complication (HCC)   . Hypertension   . Sleep apnea   . Vertigo     Past Surgical History:  Procedure Laterality Date  . ABDOMINAL HYSTERECTOMY    . BREAST BIOPSY Left 10/2017    APOCRINE TYPE CYST WITH FIBROSIS Of the Wall  . CORONARY STENT INTERVENTION N/A 02/24/2018   Procedure: CORONARY STENT INTERVENTION;  Surgeon: Marcina Millard, MD;  Location: [facility];  Service: Cardiovascular;  Laterality: N/A;  . LEFT HEART CATH AND CORONARY ANGIOGRAPHY Right 02/24/2018   Procedure: LEFT HEART CATH AND CORONARY ANGIOGRAPHY;  Surgeon: Marcina Millard, MD;  Location: [facility];  Service: Cardiovascular;  Laterality: Right;  . SHOULDER ARTHROSCOPY WITH OPEN ROTATOR CUFF REPAIR Left 08/14/2017   Procedure: SHOULDER ARTHROSCOPY WITH ROTATOR CUFF REPAIR, BICEPS TENOTOMY, SUBACROMIAL DECOMPRESSION;  Surgeon: Lyndle Herrlich, MD;  Location: [facility];  Service: Orthopedics;  Laterality: Left;  . UMBILICAL HERNIA REPAIR      There were no vitals filed for this visit.  Subjective  Assessment - 10/13/19 1002    Subjective  Patient reports she's had increased pain along the knee the past couple of days. Patient states she's had increased swelling the past 2 days.    Pertinent History  Onset [name] 2020 during treadmill training as part of cardiac rehabilitation. Cortisone shots provide relief for "a couple months"; last shot in June, relief until September. Attempting to increase exercise for weight loss and cardiac health but currently able to walk about "one block" before requiring rest due to knee pain. Recently purchased [facility] for assistance with ambulation. Pain 5/10, 10/10 worst, 0/10 best, with pain in R hip and LBP with increased activity. Agg: walking, standing. Ease: Voltaren, Aspirin, rest. Does not bother her in non-WB. Comorbidities: CAD, HTN, smoker, occasional vertigo "comes and goes every few months", DM2; L shoulder rotator cuff repair with fair recovery.    Limitations  Walking;Standing    How long can you sit comfortably?  unlimited    How long can you stand comfortably?  5 min    How long can you walk comfortably?  20 min    Diagnostic tests  x-ray (per pt report; the cartilage was "gone" on B knees)    Patient Stated Goals  walk with less pain to lose weight    Currently in Pain?  Yes    Pain Score  7     Pain Location  Knee    Pain Orientation  Right    Pain Descriptors / Indicators  Aching    Pain Type  Chronic pain    Pain Onset  More than a month ago    Pain Frequency  Intermittent       TREATMENT Therapeutic Exercise  Nustep in sitting with focusing on improving mobility of the knee -- x10 min level 2 with use of the arms with focus on improving speed with movement  Ambulation with focus on improving stride length and speed -- x 169ft with use of cane in the L UE   SLR in long sitting - x 10  Side stepping up and down 4" step - x10 Leg Press 10# B LE , U LE on the R - x 20 Knee flexion/extension with use of weighted ball - x 10     Manual  therapy Tibial-femoral mobilizations AP with patient positioned in long sitting; performed in all directions inf/sup/laterally/medially to improve patellar movement in standing -x1min   Performed exercises to improve strengthening    PT Education - 10/13/19 1007    Education Details  form/technique with exercise    Person(s) Educated  Patient    Methods  Explanation;Demonstration    Comprehension  Verbalized understanding;Returned demonstration       PT Short Term Goals - 08/04/19 1416      PT SHORT TERM GOAL #1   Title  Patient will be independent with HEP as adjunct to clinical therapy and to reduce number of visits.    Baseline  HEP given    Time  2    Period  Weeks    Status  Achieved    Target Date  05/18/19      PT SHORT TERM GOAL #2   Title  Patient will demonstrate ability to perform correct gait pattern with SPC 100% of the time for improved energy conservation and functional capacity with ambulation and ADLs.    Baseline  Correct gait pattern 50% of the time    Time  2    Period  Weeks    Status  Achieved    Target Date  05/18/19      PT SHORT TERM GOAL #3   Title  Patient will report 50% or greater improvement in her symptoms of dizziness and imbalance with provoking motions/positions    Baseline  Vertigo 8/10; 08/04/2019: 90% improvement    Time  2    Period  Weeks    Status  Achieved    Target Date  05/30/19        PT Long Term Goals - 09/13/19 1628      PT LONG TERM GOAL #1   Title  Patient will improve 10MW speed to 0.8 m/sec to achieve community ambulator status and reduce fall risks.    Baseline  0.69 m/sec with SPC; 11/19 0.74; 12/21 deferred; 07/28/2019 0.878    Time  8    Period  Weeks    Status  Achieved      PT LONG TERM GOAL #2   Title  Patient will improve LEFS score by 9 points to achieve MCID for reduced disability and improved function.    Baseline  LEFS 25; 11/19 could not assess; 12/21 deferred for time; 07/28/2019 deferred for time;  09/01/2019: LEFS: 30    Time  8    Period  Weeks    Status  On-going      PT LONG TERM GOAL #3   Title  Patient will reduce worst pain to  6/10 to demonstrate reduced disability with ADLs and improved functional capacity.    Baseline  10/10 worst pain; 11/19 0/10; 12/21 5/10; 07/28/2019 2/10;   pain 0/10 following cortisol injection   Time  8    Period  Weeks    Status  Achieved      PT LONG TERM GOAL #4   Title  Patient will report walking tolerance of 45 minutes for for return to cardiac walking program.    Baseline  walking tolerance 20 min; 11/19 20 min secondary to vertigo; 12/21 20 min; 07/28/2019 cannot assess; 09/01/2019: 20 min    Time  8    Period  Weeks    Status  On-going      PT LONG TERM GOAL #5   Title  Patient will report no vertigo with provoking motions or positions in order to be able to perform ADLs and mobility tasks without symptoms.    Baseline  8/10 dizziness with provoking motions; 11/19 8/10    Time  4    Period  Weeks    Status  Achieved      PT LONG TERM GOAL #6   Title  Patient will reduce worst pain to 0/10 with gait on stairs for reduced disability with gait.    Baseline  07/28/2019 2/10 on stairs; 09/01/2019: 8/10    Time  6    Period  Weeks    Status  On-going      PT LONG TERM GOAL #7   Title  Patient will perform leg press on RLE alone with 40# for 10 reps to demonstrate partity with LLE and prevent long-term increased stress on LLE.    Baseline  RLE 20# x 10 reps; 09/01/2019: 20# x 10 reps    Time  6    Period  Weeks    Status  On-going            Plan - 10/13/19 1259    Clinical Impression Statement  Patient continues to have increased pain with performing end range knee extension, limiting ability to walk. Patient has increased difficulty with performing pronlonged standing. Attempted knee mobilizations to help address knee pain, this was moderately successful as she had slight decrease in pain, however the tissue weakness was too great to  see long term successs. Patient will benefit from further skilled therapy focused on strenghtening to return to prior level of function.    Personal Factors and Comorbidities  Age;Comorbidity 3+;Fitness    Comorbidities  CAD, vertigo, HTN, L shoulder pain/weakness    Examination-Activity Limitations  Bathing;Locomotion Level;Bend;Squat;Stairs;Stand;Dressing    Examination-Participation Restrictions  Cleaning;Community Activity;Other   Exercise   Stability/Clinical Decision Making  Evolving/Moderate complexity    Rehab Potential  Good    PT Frequency  2x / week    PT Duration  8 weeks    PT Treatment/Interventions  ADLs/Self Care Home Management;Aquatic Therapy;Cryotherapy;Electrical Stimulation;Moist Heat;DME Instruction;Gait training;Stair training;Functional mobility training;Therapeutic activities;Therapeutic exercise;Balance training;Neuromuscular re-education;Patient/family education;Manual techniques;Passive range of motion;Dry needling;Energy conservation;Taping;Joint Manipulations;Canalith Repostioning    PT Next Visit Plan  Quad therex against band, leg press; hip extension therex; STS; hip kickers    PT Home Exercise Plan  LAQ against green theraband, standing hip abd    Consulted and Agree with Plan of Care  Patient       Patient will benefit from skilled therapeutic intervention in order to improve the following deficits and impairments:  Abnormal gait, Decreased coordination, Decreased range of motion, Difficulty walking, Decreased endurance, Obesity, Decreased  activity tolerance, Pain, Decreased balance, Hypomobility, Improper body mechanics, Impaired flexibility, Decreased mobility, Decreased strength, Increased edema, Postural dysfunction  Visit Diagnosis: Chronic pain of right knee  Pain in right hip     Problem List Patient Active Problem List   Diagnosis Date Noted  . Thrombocytopenia (HCC) 04/08/2019  . HTN (hypertension) 07/05/2018  . Tobacco use 07/05/2018  .  S/P drug eluting coronary stent placement 03/15/2018  . Unstable angina (HCC) 02/24/2018  . NSTEMI (non-ST elevated myocardial infarction) (HCC) 02/24/2018  . Full thickness rotator cuff tear 07/31/2017    Myrene Galas, PT DPT 10/13/2019, 1:48 PM  [place] [facility] AND SP[facility]TS [facility]IC[facility] [address], [place], [address] Phone: [phone]   Fax:  [phone]  Name: Karen Potts MRN: [id number] Date of Birth: 24-[name]-1961

## 2019-10-18 ENCOUNTER — Ambulatory Visit: Payer: Medicaid Other

## 2019-10-18 ENCOUNTER — Other Ambulatory Visit: Payer: Self-pay

## 2019-10-18 DIAGNOSIS — M25561 Pain in right knee: Secondary | ICD-10-CM

## 2019-10-18 DIAGNOSIS — M25551 Pain in right hip: Secondary | ICD-10-CM

## 2019-10-18 DIAGNOSIS — G8929 Other chronic pain: Secondary | ICD-10-CM

## 2019-10-18 NOTE — Addendum Note (Signed)
Addended by: Bethanie Dicker on: 10/18/2019 03:18 PM   Modules accepted: Orders

## 2019-10-18 NOTE — Therapy (Signed)
[facility] PHYSICAL AND SPORTS MEDICINE [address] [place], [address] Phone: [phone]   Fax:  [phone]  Physical Therapy Treatment  Patient Details  Name: Karen Potts MRN: [id number] Date of Birth: 1960/09/27 Referring Provider (PT): Toy Cookey MD   Encounter Date: 10/18/2019  PT End of Session - 10/18/19 1406    Visit Number  36    Number of Visits  40    Date for PT Re-Evaluation  10/25/19    Authorization Type  09/13/2019 - 10/24/2019 7/11 PT visits    PT Start Time  1115    PT Stop Time  1200    PT Time Calculation (min)  45 min    Equipment Utilized During Treatment  Gait belt    Activity Tolerance  Patient tolerated treatment well   Treatment limited secondary to vertigo   Behavior During Therapy  [facility] for tasks assessed/performed       Past Medical History:  Diagnosis Date  . Diabetes mellitus without complication (HCC)   . Hypertension   . Sleep apnea   . Vertigo     Past Surgical History:  Procedure Laterality Date  . ABDOMINAL HYSTERECTOMY    . BREAST BIOPSY Left 10/2017    APOCRINE TYPE CYST WITH FIBROSIS Of the Wall  . CORONARY STENT INTERVENTION N/A 02/24/2018   Procedure: CORONARY STENT INTERVENTION;  Surgeon: Marcina Millard, MD;  Location: [facility] INVASIVE CV LAB;  Service: Cardiovascular;  Laterality: N/A;  . LEFT HEART CATH AND CORONARY ANGIOGRAPHY Right 02/24/2018   Procedure: LEFT HEART CATH AND CORONARY ANGIOGRAPHY;  Surgeon: Marcina Millard, MD;  Location: [facility] INVASIVE CV LAB;  Service: Cardiovascular;  Laterality: Right;  . SHOULDER ARTHROSCOPY WITH OPEN ROTATOR CUFF REPAIR Left 08/14/2017   Procedure: SHOULDER ARTHROSCOPY WITH ROTATOR CUFF REPAIR, BICEPS TENOTOMY, SUBACROMIAL DECOMPRESSION;  Surgeon: Lyndle Herrlich, MD;  Location: [facility] ORS;  Service: Orthopedics;  Laterality: Left;  . UMBILICAL HERNIA REPAIR      There were no vitals filed for this visit.  Subjective  Assessment - 10/18/19 1129    Subjective  Patient states increased knee pain. Patient reports she is improving overall but continues to have difficulty with walking and increased pain.    Pertinent History  Onset [name] 2020 during treadmill training as part of cardiac rehabilitation. Cortisone shots provide relief for "a couple months"; last shot in June, relief until September. Attempting to increase exercise for weight loss and cardiac health but currently able to walk about "one block" before requiring rest due to knee pain. Recently purchased [facility] for assistance with ambulation. Pain 5/10, 10/10 worst, 0/10 best, with pain in R hip and LBP with increased activity. Agg: walking, standing. Ease: Voltaren, Aspirin, rest. Does not bother her in non-WB. Comorbidities: CAD, HTN, smoker, occasional vertigo "comes and goes every few months", DM2; L shoulder rotator cuff repair with fair recovery.    Limitations  Walking;Standing    How long can you sit comfortably?  unlimited    How long can you stand comfortably?  5 min    How long can you walk comfortably?  20 min    Diagnostic tests  x-ray (per pt report; the cartilage was "gone" on B knees)    Patient Stated Goals  walk with less pain to lose weight    Currently in Pain?  Yes    Pain Score  7     Pain Location  Knee    Pain Orientation  Right  Pain Descriptors / Indicators  Aching    Pain Type  Chronic pain    Pain Onset  More than a month ago    Pain Frequency  Intermittent          TREATMENT Therapeutic Exercise Reviewed HEP to be performed at home: LAQ with GTB in sitting -- 2 x 20  Hip Abduction in long sitting B -- 2 x 20  Ankle eversion on L side -- 2 x 20  Nustep seat level 11 focus on improving speed and increasing resistance -- 10 min SLR in long sitting-- 2 x 20  Seated ball rolls outs to decrease increased spasms and pain -- 2 x 20   Performed exercises to decrease pain and improve LE strength   PT Education - 10/18/19  1134    Education Details  form/technique with exercise; HEP: SLR,    Person(s) Educated  Patient    Methods  Explanation;Demonstration    Comprehension  Verbalized understanding;Returned demonstration       PT Short Term Goals - 10/18/19 1407      PT SHORT TERM GOAL #1   Title  Patient will be independent with HEP as adjunct to clinical therapy and to reduce number of visits.    Baseline  HEP given    Time  2    Period  Weeks    Status  Achieved    Target Date  05/18/19      PT SHORT TERM GOAL #2   Title  Patient will demonstrate ability to perform correct gait pattern with SPC 100% of the time for improved energy conservation and functional capacity with ambulation and ADLs.    Baseline  Correct gait pattern 50% of the time    Time  2    Period  Weeks    Status  Achieved    Target Date  05/18/19      PT SHORT TERM GOAL #3   Title  Patient will report 50% or greater improvement in her symptoms of dizziness and imbalance with provoking motions/positions    Baseline  Vertigo 8/10; 08/04/2019: 90% improvement    Time  2    Period  Weeks    Status  Achieved    Target Date  05/30/19        PT Long Term Goals - 10/18/19 1407      PT LONG TERM GOAL #1   Title  Patient will improve 10MW speed to 0.8 m/sec to achieve community ambulator status and reduce fall risks.    Baseline  0.69 m/sec with SPC; 11/19 0.74; 12/21 deferred; 07/28/2019 0.878    Time  8    Period  Weeks    Status  Achieved      PT LONG TERM GOAL #2   Title  Patient will improve LEFS score by 9 points to achieve MCID for reduced disability and improved function.    Baseline  LEFS 25; 11/19 could not assess; 12/21 deferred for time; 07/28/2019 deferred for time; 09/01/2019: LEFS: 30; 10/18/2019: LEFS: 30    Time  8    Period  Weeks    Status  On-going      PT LONG TERM GOAL #3   Title  Patient will reduce worst pain to 6/10 to demonstrate reduced disability with ADLs and improved functional capacity.     Baseline  10/10 worst pain; 11/19 0/10; 12/21 5/10; 07/28/2019 2/10; 10/18/2019: 7/10   pain 0/10 following cortisol injection   Time  8  Period  Weeks    Status  On-going      PT LONG TERM GOAL #4   Title  Patient will report walking tolerance of 45 minutes for for return to cardiac walking program.    Baseline  walking tolerance 20 min; 11/19 20 min secondary to vertigo; 12/21 20 min; 07/28/2019 cannot assess; 09/01/2019: 20 min; 10/18/2019: 20 min    Time  8    Period  Weeks    Status  On-going      PT LONG TERM GOAL #5   Title  Patient will report no vertigo with provoking motions or positions in order to be able to perform ADLs and mobility tasks without symptoms.    Baseline  8/10 dizziness with provoking motions; 11/19 8/10; 10/18/2019: 7/10    Time  4    Period  Weeks    Status  Achieved      PT LONG TERM GOAL #6   Title  Patient will reduce worst pain to 0/10 with gait on stairs for reduced disability with gait.    Baseline  07/28/2019 2/10 on stairs; 09/01/2019: 8/10; 10/18/2019: 7/10    Time  6    Period  Weeks    Status  On-going      PT LONG TERM GOAL #7   Title  Patient will perform leg press on RLE alone with 40# for 10 reps to demonstrate partity with LLE and prevent long-term increased stress on LLE.    Baseline  RLE 20# x 10 reps; 09/01/2019: 20# x 10 reps; 10/18/2019: 25# x 10    Time  6    Period  Weeks    Status  On-going            Plan - 10/18/19 1408    Clinical Impression Statement  Patient has made limited progress towards long term goals, is able to push a greater amount of weight through leg press and less pain with descending the stairs, however has a relatively unchanged walking endurance (stopping secondary to pain) and LEFS scores. Patient also has had recent exaccerbation in vertigo symptoms limiting patient's participation. Patient however reports no falls since the start of therapy and has made limited progress functionally secondary to an increase  pain response. Patient will benefit from furhter skilled therapy focused on improving limitations to return to prior level of function.    Personal Factors and Comorbidities  Age;Comorbidity 3+;Fitness    Comorbidities  CAD, vertigo, HTN, L shoulder pain/weakness    Examination-Activity Limitations  Bathing;Locomotion Level;Bend;Squat;Stairs;Stand;Dressing    Examination-Participation Restrictions  Cleaning;Community Activity;Other   Exercise   Stability/Clinical Decision Making  Evolving/Moderate complexity    Rehab Potential  Good    PT Frequency  1x / week    PT Duration  4 weeks    PT Treatment/Interventions  ADLs/Self Care Home Management;Aquatic Therapy;Cryotherapy;Electrical Stimulation;Moist Heat;DME Instruction;Gait training;Stair training;Functional mobility training;Therapeutic activities;Therapeutic exercise;Balance training;Neuromuscular re-education;Patient/family education;Manual techniques;Passive range of motion;Dry needling;Energy conservation;Taping;Joint Manipulations;Canalith Repostioning    PT Next Visit Plan  Quad therex against band, leg press; hip extension therex; STS; hip kickers    PT Home Exercise Plan  LAQ against green theraband, standing hip abd    Consulted and Agree with Plan of Care  Patient       Patient will benefit from skilled therapeutic intervention in order to improve the following deficits and impairments:  Abnormal gait, Decreased coordination, Decreased range of motion, Difficulty walking, Decreased endurance, Obesity, Decreased activity tolerance, Pain, Decreased balance, Hypomobility, Improper  body mechanics, Impaired flexibility, Decreased mobility, Decreased strength, Increased edema, Postural dysfunction  Visit Diagnosis: Chronic pain of right knee  Pain in right hip     Problem List Patient Active Problem List   Diagnosis Date Noted  . Thrombocytopenia (HCC) 04/08/2019  . HTN (hypertension) 07/05/2018  . Tobacco use 07/05/2018  . S/P  drug eluting coronary stent placement 03/15/2018  . Unstable angina (HCC) 02/24/2018  . NSTEMI (non-ST elevated myocardial infarction) (HCC) 02/24/2018  . Full thickness rotator cuff tear 07/31/2017    Myrene Galas, PT DPT 10/18/2019, 2:14 PM  [facility] PHYSICAL AND SPORTS MEDICINE [address], [place], [address] Phone: [phone]   Fax:  [phone]  Name: Karen Potts MRN: [id number] Date of Birth: 1960-12-05

## 2019-10-19 ENCOUNTER — Ambulatory Visit: Payer: Medicaid Other | Attending: Internal Medicine

## 2019-10-19 DIAGNOSIS — Z23 Encounter for immunization: Secondary | ICD-10-CM

## 2019-10-19 NOTE — Progress Notes (Signed)
   Covid-19 Vaccination Clinic  Name:  Karen Potts    MRN: [id number] DOB: 1961/06/30  10/19/2019  Ms. Karen Potts was observed post Covid-19 immunization for 15 minutes without incident. She was provided with Vaccine Information Sheet and instruction to access the V-Safe system.   Ms. Karen Potts was instructed to call 911 with any severe reactions post vaccine: Marland Kitchen Difficulty breathing  . Swelling of face and throat  . A fast heartbeat  . A bad rash all over body  . Dizziness and weakness   Immunizations Administered    Name Date Dose VIS Date Route   Pfizer COVID-19 Vaccine 10/19/2019  9:13 AM 0.3 mL 08/24/2018 Intramuscular   Manufacturer: [employer], [employer]   Lot: [id number]   NDC: [address]-4

## 2019-10-25 ENCOUNTER — Ambulatory Visit: Payer: Medicaid Other | Admitting: Physical Therapy

## 2019-10-25 ENCOUNTER — Ambulatory Visit: Payer: Medicaid Other

## 2019-10-25 ENCOUNTER — Encounter: Payer: Self-pay | Admitting: Physical Therapy

## 2019-10-25 ENCOUNTER — Other Ambulatory Visit: Payer: Self-pay

## 2019-10-25 DIAGNOSIS — R42 Dizziness and giddiness: Secondary | ICD-10-CM

## 2019-10-25 DIAGNOSIS — R2689 Other abnormalities of gait and mobility: Secondary | ICD-10-CM

## 2019-10-25 NOTE — Therapy (Signed)
[facility] MAIN [facility] SERVICES [address] [place], [place], [address] Phone: [phone]   Fax:  [phone]  Physical Therapy Treatment/Recertification Dates of service: 05/03/2020-10/25/2019 Number of visits: 37  Patient Details  Name: Karen Potts MRN: [id number] Date of Birth: 02/28/1961 Referring Provider (PT): Toy Cookey MD   Encounter Date: 10/25/2019  PT End of Session - 10/25/19 1853    Visit Number  37    Number of Visits  46    Date for PT Re-Evaluation  12/06/19    Authorization Type  09/13/2019 - 10/25/2019 8/11 PT visits    PT Start Time  1350    PT Stop Time  1500    PT Time Calculation (min)  70 min    Activity Tolerance  Other (comment)   Treatment limited secondary to vertigo and nausea and vomiting   Behavior During Therapy  [facility] for tasks assessed/performed       Past Medical History:  Diagnosis Date  . Diabetes mellitus without complication (HCC)   . Hypertension   . Sleep apnea   . Vertigo     Past Surgical History:  Procedure Laterality Date  . ABDOMINAL HYSTERECTOMY    . BREAST BIOPSY Left 10/2017    APOCRINE TYPE CYST WITH FIBROSIS Of the Wall  . CORONARY STENT INTERVENTION N/A 02/24/2018   Procedure: CORONARY STENT INTERVENTION;  Surgeon: Marcina Millard, MD;  Location: [facility] INVASIVE CV LAB;  Service: Cardiovascular;  Laterality: N/A;  . LEFT HEART CATH AND CORONARY ANGIOGRAPHY Right 02/24/2018   Procedure: LEFT HEART CATH AND CORONARY ANGIOGRAPHY;  Surgeon: Marcina Millard, MD;  Location: [facility] INVASIVE CV LAB;  Service: Cardiovascular;  Laterality: Right;  . SHOULDER ARTHROSCOPY WITH OPEN ROTATOR CUFF REPAIR Left 08/14/2017   Procedure: SHOULDER ARTHROSCOPY WITH ROTATOR CUFF REPAIR, BICEPS TENOTOMY, SUBACROMIAL DECOMPRESSION;  Surgeon: Lyndle Herrlich, MD;  Location: [facility] ORS;  Service: Orthopedics;  Laterality: Left;  . UMBILICAL HERNIA REPAIR      There were no vitals filed for this  visit.   Patient reports she had been doing well with no episodes of vertigo until the last week of March she states she began to have a mild episode or two. Patient states however that she went to a spa on the weekend of April 17th and she was getting a massage when the massage therapist went from working on one side of her neck to the other, the patient began to have vertigo and was nauseous. Patient reports that if she lies on the left side, when she rolls to the right side she gets a "head rush" and vertigo.    Canalith Repositioning Maneuver: On mat table, performed left Dix-Hallpike testing and it was negative with no nystagmus observed and patient denied dizziness. However, when performing right Epley maneuver, when patient went from head rotated to right to head rotated to left position, patient began to experience a second bout of vertigo and observed a reversal of the nystagmus to left upbeating, nystagmus of short duration, which could indicate positive left BPPV as well.    On mat table, performed right Dix-Hallpike testing and it was positive with right upbeating torsional nystagmus of short duration with a few seconds of latency about <5 seconds. Performed right canalith repositioning maneuver (CRT), but when patient about to roll over to left side-lying position, patient became nauseous and needed to sit up and therefore unable to complete the maneuver. Patient with some mild dry heaves and spitting and required  several minutes rest break.  Repeated right CRT for a total of 2 full maneuvers with 30 second holds as patient unable to tolerate 1 minute holds and patient had episode of nausea with vomiting between the maneuvers and required prolonged rest break after each manuever. Patient reported that she did not want to quit and wanted to try to get 2 or 3 treatment maneuvers in this date, but she was only able to tolerate 2 maneuvers.   Patient returns to the clinic with reports of  reoccurrence of her vertiginous symptoms. Therefore, retested left and right Dix-Hallpike tests and performed right Epley maneuvers. Patient signs and symptoms that could be indicative of right and left BPPV. Patient became nauseous and had episode of nausea and vomiting with Epley maneuvers this date. Will plan on retesting Dix-Hallpike tests and repeating Epley maneuvers per patient tolerance if indicated next session. Patient would benefit from continued PT services to address goals and deficits. Updated long term goal #5 pertaining to vertigo this date.     PT Education - 10/25/19 1852    Education Details  discussed BPPV and Epley maneuver    Person(s) Educated  Patient    Methods  Explanation    Comprehension  Verbalized understanding       PT Short Term Goals - 10/18/19 1407      PT SHORT TERM GOAL #1   Title  Patient will be independent with HEP as adjunct to clinical therapy and to reduce number of visits.    Baseline  HEP given    Time  2    Period  Weeks    Status  Achieved    Target Date  05/18/19      PT SHORT TERM GOAL #2   Title  Patient will demonstrate ability to perform correct gait pattern with SPC 100% of the time for improved energy conservation and functional capacity with ambulation and ADLs.    Baseline  Correct gait pattern 50% of the time    Time  2    Period  Weeks    Status  Achieved    Target Date  05/18/19      PT SHORT TERM GOAL #3   Title  Patient will report 50% or greater improvement in her symptoms of dizziness and imbalance with provoking motions/positions    Baseline  Vertigo 8/10; 08/04/2019: 90% improvement    Time  2    Period  Weeks    Status  Achieved    Target Date  05/30/19        PT Long Term Goals - 10/25/19 1855      PT LONG TERM GOAL #1   Title  Patient will improve speed to 0.8 m/sec to achieve community ambulator status and reduce fall risks.    Baseline  0.69 m/sec with SPC; 11/19 0.74; 12/21 deferred; 07/28/2019 0.878     Time  8    Period  Weeks    Status  Achieved      PT LONG TERM GOAL #2   Title  Patient will improve LEFS score by 9 points to achieve MCID for reduced disability and improved function.    Baseline  LEFS 25; 11/19 could not assess; 12/21 deferred for time; 07/28/2019 deferred for time; 09/01/2019: LEFS: 30; 10/18/2019: LEFS: 30    Time  8    Period  Weeks    Status  On-going      PT LONG TERM GOAL #3   Title  Patient  will reduce worst pain to 6/10 to demonstrate reduced disability with ADLs and improved functional capacity.    Baseline  10/10 worst pain; 11/19 0/10; 12/21 5/10; 07/28/2019 2/10; 10/18/2019: 7/10   pain 0/10 following cortisol injection   Time  8    Period  Weeks    Status  On-going      PT LONG TERM GOAL #4   Title  Patient will report walking tolerance of 45 minutes for for return to cardiac walking program.    Baseline  walking tolerance 20 min; 11/19 20 min secondary to vertigo; 12/21 20 min; 07/28/2019 cannot assess; 09/01/2019: 20 min; 10/18/2019: 20 min    Time  8    Period  Weeks    Status  On-going      PT LONG TERM GOAL #5   Title  Patient will report no vertigo with provoking motions or positions in order to be able to perform ADLs and mobility tasks without symptoms.    Baseline  8/10 dizziness with provoking motions; 11/19 8/10; 10/18/2019: 7/10; 10/25/2019-patient reports 6-7/10 vertigo with N & V    Time  6    Period  Weeks    Status  New    Target Date  12/06/19      PT LONG TERM GOAL #6   Title  Patient will reduce worst pain to 0/10 with gait on stairs for reduced disability with gait.    Baseline  07/28/2019 2/10 on stairs; 09/01/2019: 8/10; 10/18/2019: 7/10    Time  6    Period  Weeks    Status  On-going      PT LONG TERM GOAL #7   Title  Patient will perform leg press on RLE alone with 40# for 10 reps to demonstrate partity with LLE and prevent long-term increased stress on LLE.    Baseline  RLE 20# x 10 reps; 09/01/2019: 20# x 10 reps; 10/18/2019: 25# x  10    Time  6    Period  Weeks    Status  On-going            Plan - 10/25/19 1913    Clinical Impression Statement  Patient returns to the clinic with reports of reoccurrence of her vertiginous symptoms. Therefore, retested left and right Dix-Hallpike tests and performed right Epley maneuvers. Patient signs and symptoms that could be indicative of right and left BPPV. Patient became nauseous and had episode of nausea and vomiting with Epley maneuvers this date. Will plan on retesting Dix-Hallpike tests and repeating Epley maneuvers per patient tolerance if indicated next session. Patient would benefit from continued PT services to address goals and deficits. Updated long term goal #5 pertaining to vertigo this date.    Personal Factors and Comorbidities  Age;Comorbidity 3+;Fitness    Comorbidities  CAD, vertigo, HTN, L shoulder pain/weakness    Examination-Activity Limitations  Bathing;Locomotion Level;Bend;Squat;Stairs;Stand;Dressing    Examination-Participation Restrictions  Cleaning;Community Activity;Other   Exercise   Stability/Clinical Decision Making  Evolving/Moderate complexity    Rehab Potential  Good    PT Frequency  1x / week    PT Duration  4 weeks    PT Treatment/Interventions  ADLs/Self Care Home Management;Aquatic Therapy;Cryotherapy;Electrical Stimulation;Moist Heat;DME Instruction;Gait training;Stair training;Functional mobility training;Therapeutic activities;Therapeutic exercise;Balance training;Neuromuscular re-education;Patient/family education;Manual techniques;Passive range of motion;Dry needling;Energy conservation;Taping;Joint Manipulations;Canalith Repostioning    PT Next Visit Plan  Quad therex against band, leg press; hip extension therex; STS; hip kickers    PT Home Exercise Plan  LAQ against green  theraband, standing hip abd    Consulted and Agree with Plan of Care  Patient       Patient will benefit from skilled therapeutic intervention in order to  improve the following deficits and impairments:  Abnormal gait, Decreased coordination, Decreased range of motion, Difficulty walking, Decreased endurance, Obesity, Decreased activity tolerance, Pain, Decreased balance, Hypomobility, Improper body mechanics, Impaired flexibility, Decreased mobility, Decreased strength, Increased edema, Postural dysfunction  Visit Diagnosis: Dizziness and giddiness  Other abnormalities of gait and mobility     Problem List Patient Active Problem List   Diagnosis Date Noted  . Thrombocytopenia (HCC) 04/08/2019  . HTN (hypertension) 07/05/2018  . Tobacco use 07/05/2018  . S/P drug eluting coronary stent placement 03/15/2018  . Unstable angina (HCC) 02/24/2018  . NSTEMI (non-ST elevated myocardial infarction) (HCC) 02/24/2018  . Full thickness rotator cuff tear 07/31/2017   Mardelle Matte PT, DPT [phone] Mardelle Matte 10/25/2019, 7:14 PM  [facility] MAIN [facility] SERVICES [address] [place], [place], [address] Phone: [phone]   Fax:  [phone]  Name: Karen Potts MRN: [id number] Date of Birth: [birth year]

## 2019-11-01 ENCOUNTER — Encounter: Payer: Self-pay | Admitting: Physical Therapy

## 2019-11-01 ENCOUNTER — Ambulatory Visit: Payer: Medicaid Other | Attending: Family Medicine | Admitting: Physical Therapy

## 2019-11-01 ENCOUNTER — Other Ambulatory Visit: Payer: Self-pay

## 2019-11-01 DIAGNOSIS — R2689 Other abnormalities of gait and mobility: Secondary | ICD-10-CM | POA: Diagnosis present

## 2019-11-01 DIAGNOSIS — R42 Dizziness and giddiness: Secondary | ICD-10-CM | POA: Diagnosis present

## 2019-11-01 NOTE — Therapy (Signed)
[place] [facility] MAIN [facility] SERVICES [address] [place], [place], [address] Phone: [phone]   Fax:  [phone]  Physical Therapy Treatment  Patient Details  Name: Karen Potts MRN: [id number] Date of Birth: January 15, 1961 Referring Provider (PT): Toy Cookey MD   Encounter Date: 11/01/2019  PT End of Session - 11/01/19 1125    Visit Number  38    Number of Visits  46    Date for PT Re-Evaluation  12/06/19    Authorization Type  09/13/2019 - 10/25/2019 8/11 PT visits    PT Start Time  1120    PT Stop Time  1200    PT Time Calculation (min)  40 min    Activity Tolerance  Other (comment)   Treatment limited secondary to vertigo and nausea and vomiting   Behavior During Therapy  [facility] for tasks assessed/performed       Past Medical History:  Diagnosis Date  . Diabetes mellitus without complication (HCC)   . Hypertension   . Sleep apnea   . Vertigo     Past Surgical History:  Procedure Laterality Date  . ABDOMINAL HYSTERECTOMY    . BREAST BIOPSY Left 10/2017    APOCRINE TYPE CYST WITH FIBROSIS Of the Wall  . CORONARY STENT INTERVENTION N/A 02/24/2018   Procedure: CORONARY STENT INTERVENTION;  Surgeon: Marcina Millard, MD;  Location: [facility] INVASIVE CV LAB;  Service: Cardiovascular;  Laterality: N/A;  . LEFT HEART CATH AND CORONARY ANGIOGRAPHY Right 02/24/2018   Procedure: LEFT HEART CATH AND CORONARY ANGIOGRAPHY;  Surgeon: Marcina Millard, MD;  Location: [facility] INVASIVE CV LAB;  Service: Cardiovascular;  Laterality: Right;  . SHOULDER ARTHROSCOPY WITH OPEN ROTATOR CUFF REPAIR Left 08/14/2017   Procedure: SHOULDER ARTHROSCOPY WITH ROTATOR CUFF REPAIR, BICEPS TENOTOMY, SUBACROMIAL DECOMPRESSION;  Surgeon: Lyndle Herrlich, MD;  Location: [facility] ORS;  Service: Orthopedics;  Laterality: Left;  . UMBILICAL HERNIA REPAIR      There were no vitals filed for this visit.  Subjective Assessment - 11/01/19 1124    Subjective  Patient states  her right knee is a little sore today. Patient states she has not had any episodes of vertigo since last week, but does state that if she turns her head she has gotten the sensation that her symptoms are starting to come on so she states she stops or slows down her movements.    Pertinent History  Onset [name] 2020 during treadmill training as part of cardiac rehabilitation. Cortisone shots provide relief for "a couple months"; last shot in June, relief until September. Attempting to increase exercise for weight loss and cardiac health but currently able to walk about "one block" before requiring rest due to knee pain. Recently purchased [facility] for assistance with ambulation. Pain 5/10, 10/10 worst, 0/10 best, with pain in R hip and LBP with increased activity. Agg: walking, standing. Ease: Voltaren, Aspirin, rest. Does not bother her in non-WB. Comorbidities: CAD, HTN, smoker, occasional vertigo "comes and goes every few months", DM2; L shoulder rotator cuff repair with fair recovery.    Limitations  Walking;Standing    How long can you sit comfortably?  unlimited    How long can you stand comfortably?  5 min    How long can you walk comfortably?  20 min    Diagnostic tests  x-ray (per pt report; the cartilage was "gone" on B knees)    Patient Stated Goals  walk with less pain to lose weight    Pain Onset  More than a month ago        Canalith Repositioning Maneuver:  On mat table, performed right Dix-Hallpike testing and it was positive with right upbeating torsional nystagmus of short duration without latency. Performed right canalith repositioning maneuver (CRT). Repeated right CRT for a total of 3 maneuvers with retesting between maneuvers. Noted on all 3 maneuvers when patient turned her head form right rotation to left rotated position that patient had reversal of the nystagmus to left upbeating torsional of short duration about 1 minute or less after a few second latency.  Patient reports 7/10  vertigo with first, 4/10 vertigo with second and 4-5/10 with third maneuver.   PT Education - 11/01/19 1451    Education Details  discussed BPPV and EPley maneuver; discussed the [facility] which provides free pro-bono PT services    Person(s) Educated  Patient    Methods  Explanation    Comprehension  Verbalized understanding         PT Short Term Goals - 10/18/19 1407      PT SHORT TERM GOAL #1   Title  Patient will be independent with HEP as adjunct to clinical therapy and to reduce number of visits.    Baseline  HEP given    Time  2    Period  Weeks    Status  Achieved    Target Date  05/18/19      PT SHORT TERM GOAL #2   Title  Patient will demonstrate ability to perform correct gait pattern with SPC 100% of the time for improved energy conservation and functional capacity with ambulation and ADLs.    Baseline  Correct gait pattern 50% of the time    Time  2    Period  Weeks    Status  Achieved    Target Date  05/18/19      PT SHORT TERM GOAL #3   Title  Patient will report 50% or greater improvement in her symptoms of dizziness and imbalance with provoking motions/positions    Baseline  Vertigo 8/10; 08/04/2019: 90% improvement    Time  2    Period  Weeks    Status  Achieved    Target Date  05/30/19        PT Long Term Goals - 10/25/19 1855      PT LONG TERM GOAL #1   Title  Patient will improve speed to 0.8 m/sec to achieve community ambulator status and reduce fall risks.    Baseline  0.69 m/sec with SPC; 11/19 0.74; 12/21 deferred; 07/28/2019 0.878    Time  8    Period  Weeks    Status  Achieved      PT LONG TERM GOAL #2   Title  Patient will improve LEFS score by 9 points to achieve MCID for reduced disability and improved function.    Baseline  LEFS 25; 11/19 could not assess; 12/21 deferred for time; 07/28/2019 deferred for time; 09/01/2019: LEFS: 30; 10/18/2019: LEFS: 30    Time  8    Period  Weeks    Status  On-going      PT LONG TERM GOAL #3    Title  Patient will reduce worst pain to 6/10 to demonstrate reduced disability with ADLs and improved functional capacity.    Baseline  10/10 worst pain; 11/19 0/10; 12/21 5/10; 07/28/2019 2/10; 10/18/2019: 7/10   pain 0/10 following cortisol injection   Time  8    Period  Weeks    Status  On-going      PT LONG TERM GOAL #4   Title  Patient will report walking tolerance of 45 minutes for for return to cardiac walking program.    Baseline  walking tolerance 20 min; 11/19 20 min secondary to vertigo; 12/21 20 min; 07/28/2019 cannot assess; 09/01/2019: 20 min; 10/18/2019: 20 min    Time  8    Period  Weeks    Status  On-going      PT LONG TERM GOAL #5   Title  Patient will report no vertigo with provoking motions or positions in order to be able to perform ADLs and mobility tasks without symptoms.    Baseline  8/10 dizziness with provoking motions; 11/19 8/10; 10/18/2019: 7/10; 10/25/2019-patient reports 6-7/10 vertigo with N & V    Time  6    Period  Weeks    Status  New    Target Date  12/06/19      PT LONG TERM GOAL #6   Title  Patient will reduce worst pain to 0/10 with gait on stairs for reduced disability with gait.    Baseline  07/28/2019 2/10 on stairs; 09/01/2019: 8/10; 10/18/2019: 7/10    Time  6    Period  Weeks    Status  On-going      PT LONG TERM GOAL #7   Title  Patient will perform leg press on RLE alone with 40# for 10 reps to demonstrate partity with LLE and prevent long-term increased stress on LLE.    Baseline  RLE 20# x 10 reps; 09/01/2019: 20# x 10 reps; 10/18/2019: 25# x 10    Time  6    Period  Weeks    Status  On-going            Plan - 11/01/19 1451    Clinical Impression Statement  Patient with positive right Dix-Hallpike test and performed 3 Epley maneuvers. Patient was better able to tolerate the maneuvers this date as she reports mild nausea but had no episodes of vomiting. Patient again noted to have reversal of nystagmus from right upbeating to left  upbeating torsional nystagmus when she goes from head rotated to the right to head rotated to the left during the epley maneuver which could be indicative of positive left sided BPPV in addition to the right side. Patient would benefit from continued PT services to work on clearance of particles from ear canals.    Personal Factors and Comorbidities  Age;Comorbidity 3+;Fitness    Comorbidities  CAD, vertigo, HTN, L shoulder pain/weakness    Examination-Activity Limitations  Bathing;Locomotion Level;Bend;Squat;Stairs;Stand;Dressing    Examination-Participation Restrictions  Cleaning;Community Activity;Other   Exercise   Stability/Clinical Decision Making  Evolving/Moderate complexity    Rehab Potential  Good    PT Frequency  1x / week    PT Duration  4 weeks    PT Treatment/Interventions  ADLs/Self Care Home Management;Aquatic Therapy;Cryotherapy;Electrical Stimulation;Moist Heat;DME Instruction;Gait training;Stair training;Functional mobility training;Therapeutic activities;Therapeutic exercise;Balance training;Neuromuscular re-education;Patient/family education;Manual techniques;Passive range of motion;Dry needling;Energy conservation;Taping;Joint Manipulations;Canalith Repostioning    PT Next Visit Plan  Quad therex against band, leg press; hip extension therex; STS; hip kickers    PT Home Exercise Plan  LAQ against green theraband, standing hip abd    Consulted and Agree with Plan of Care  Patient       Patient will benefit from skilled therapeutic intervention in order to improve the following deficits and impairments:  Abnormal gait, Decreased coordination,  Decreased range of motion, Difficulty walking, Decreased endurance, Obesity, Decreased activity tolerance, Pain, Decreased balance, Hypomobility, Improper body mechanics, Impaired flexibility, Decreased mobility, Decreased strength, Increased edema, Postural dysfunction  Visit Diagnosis: Dizziness and giddiness  Other abnormalities of  gait and mobility     Problem List Patient Active Problem List   Diagnosis Date Noted  . Thrombocytopenia ([place]) 04/08/2019  . HTN (hypertension) 07/05/2018  . Tobacco use 07/05/2018  . S/P drug eluting coronary stent placement 03/15/2018  . Unstable angina ([place]) 02/24/2018  . NSTEMI (non-ST elevated myocardial infarction) ([place]) 02/24/2018  . Full thickness rotator cuff tear 07/31/2017   Lady Deutscher PT, DPT [phone] Lady Deutscher 11/01/2019, 2:54 PM  [place] MAIN [facility] SERVICES [address] [place], [place], [address] Phone: [phone]   Fax:  [phone]  Name: Karen Potts MRN: [id number] Date of Birth: 07-10-1960

## 2019-11-08 ENCOUNTER — Encounter: Payer: Self-pay | Admitting: Physical Therapy

## 2019-11-08 ENCOUNTER — Other Ambulatory Visit: Payer: Self-pay

## 2019-11-08 ENCOUNTER — Ambulatory Visit: Payer: Medicaid Other | Admitting: Physical Therapy

## 2019-11-08 DIAGNOSIS — R42 Dizziness and giddiness: Secondary | ICD-10-CM

## 2019-11-08 DIAGNOSIS — R2689 Other abnormalities of gait and mobility: Secondary | ICD-10-CM

## 2019-11-08 NOTE — Therapy (Signed)
[place] [facility] MAIN [facility] SERVICES [address] [place], [place], [address] Phone: [phone]   Fax:  [phone]  Physical Therapy Treatment  Patient Details  Name: Karen Potts MRN: [id number] Date of Birth: [birth year] Referring Provider (PT): Toy Cookey MD   Encounter Date: 11/08/2019  PT End of Session - 11/08/19 0947    Visit Number  39    Number of Visits  46    Date for PT Re-Evaluation  12/06/19    Authorization Type  09/13/2019 - 10/25/2019 8/11 PT visits    PT Start Time  0947    PT Stop Time  1026    PT Time Calculation (min)  39 min    Activity Tolerance  Other (comment)   Treatment limited secondary to vertigo and nausea and vomiting   Behavior During Therapy  [facility] for tasks assessed/performed       Past Medical History:  Diagnosis Date  . Diabetes mellitus without complication (HCC)   . Hypertension   . Sleep apnea   . Vertigo     Past Surgical History:  Procedure Laterality Date  . ABDOMINAL HYSTERECTOMY    . BREAST BIOPSY Left 10/2017    APOCRINE TYPE CYST WITH FIBROSIS Of the Wall  . CORONARY STENT INTERVENTION N/A 02/24/2018   Procedure: CORONARY STENT INTERVENTION;  Surgeon: Marcina Millard, MD;  Location: [facility] INVASIVE CV LAB;  Service: Cardiovascular;  Laterality: N/A;  . LEFT HEART CATH AND CORONARY ANGIOGRAPHY Right 02/24/2018   Procedure: LEFT HEART CATH AND CORONARY ANGIOGRAPHY;  Surgeon: Marcina Millard, MD;  Location: [facility] INVASIVE CV LAB;  Service: Cardiovascular;  Laterality: Right;  . SHOULDER ARTHROSCOPY WITH OPEN ROTATOR CUFF REPAIR Left 08/14/2017   Procedure: SHOULDER ARTHROSCOPY WITH ROTATOR CUFF REPAIR, BICEPS TENOTOMY, SUBACROMIAL DECOMPRESSION;  Surgeon: Lyndle Herrlich, MD;  Location: [facility] ORS;  Service: Orthopedics;  Laterality: Left;  . UMBILICAL HERNIA REPAIR      There were no vitals filed for this visit.  Subjective Assessment - 11/08/19 0947    Subjective  Patient states  she has been trying the self-Epley maneuver at home but states she has difficulty getting through all of the treatment positions due to her symptoms and  is still having the vertigo issues.    Pertinent History  Onset [name] 2020 during treadmill training as part of cardiac rehabilitation. Cortisone shots provide relief for "a couple months"; last shot in June, relief until September. Attempting to increase exercise for weight loss and cardiac health but currently able to walk about "one block" before requiring rest due to knee pain. Recently purchased [facility] for assistance with ambulation. Pain 5/10, 10/10 worst, 0/10 best, with pain in R hip and LBP with increased activity. Agg: walking, standing. Ease: Voltaren, Aspirin, rest. Does not bother her in non-WB. Comorbidities: CAD, HTN, smoker, occasional vertigo "comes and goes every few months", DM2; L shoulder rotator cuff repair with fair recovery.    Limitations  Walking;Standing    How long can you sit comfortably?  unlimited    How long can you stand comfortably?  5 min    How long can you walk comfortably?  20 min    Diagnostic tests  x-ray (per pt report; the cartilage was "gone" on B knees)    Patient Stated Goals  walk with less pain to lose weight    Pain Onset  More than a month ago       Canalith Repositioning Maneuver: On mat table, performed right Dix-Hallpike  testing and it was positive with right upbeating torsional nystagmus of short duration without latency.  Performed right canalith repositioning maneuver (CRT).  Repeated right CRT for a total of 3 maneuvers with retesting between maneuvers. Patient reported 3/10 vertigo after 5 second latency with first maneuver and 6/10 3rd maneuver. Noted in left head rotated position during Epley maneuver that patient has reversal of nystagmus to left upbeating torsional nystagmus that fatigued within 30 seconds and was less vigorous then right side and could be indicative of bilateral BPPV.        PT Education - 11/08/19 0947    Education Details  discussed plan of care and Epley maneuver    Person(s) Educated  Patient    Methods  Explanation    Comprehension  Verbalized understanding       PT Short Term Goals - 10/18/19 1407      PT SHORT TERM GOAL #1   Title  Patient will be independent with HEP as adjunct to clinical therapy and to reduce number of visits.    Baseline  HEP given    Time  2    Period  Weeks    Status  Achieved    Target Date  05/18/19      PT SHORT TERM GOAL #2   Title  Patient will demonstrate ability to perform correct gait pattern with SPC 100% of the time for improved energy conservation and functional capacity with ambulation and ADLs.    Baseline  Correct gait pattern 50% of the time    Time  2    Period  Weeks    Status  Achieved    Target Date  05/18/19      PT SHORT TERM GOAL #3   Title  Patient will report 50% or greater improvement in her symptoms of dizziness and imbalance with provoking motions/positions    Baseline  Vertigo 8/10; 08/04/2019: 90% improvement    Time  2    Period  Weeks    Status  Achieved    Target Date  05/30/19        PT Long Term Goals - 10/25/19 1855      PT LONG TERM GOAL #1   Title  Patient will improve speed to 0.8 m/sec to achieve community ambulator status and reduce fall risks.    Baseline  0.69 m/sec with SPC; 11/19 0.74; 12/21 deferred; 07/28/2019 0.878    Time  8    Period  Weeks    Status  Achieved      PT LONG TERM GOAL #2   Title  Patient will improve LEFS score by 9 points to achieve MCID for reduced disability and improved function.    Baseline  LEFS 25; 11/19 could not assess; 12/21 deferred for time; 07/28/2019 deferred for time; 09/01/2019: LEFS: 30; 10/18/2019: LEFS: 30    Time  8    Period  Weeks    Status  On-going      PT LONG TERM GOAL #3   Title  Patient will reduce worst pain to 6/10 to demonstrate reduced disability with ADLs and improved functional capacity.     Baseline  10/10 worst pain; 11/19 0/10; 12/21 5/10; 07/28/2019 2/10; 10/18/2019: 7/10   pain 0/10 following cortisol injection   Time  8    Period  Weeks    Status  On-going      PT LONG TERM GOAL #4   Title  Patient will report walking tolerance of 45 minutes  for for return to cardiac walking program.    Baseline  walking tolerance 20 min; 11/19 20 min secondary to vertigo; 12/21 20 min; 07/28/2019 cannot assess; 09/01/2019: 20 min; 10/18/2019: 20 min    Time  8    Period  Weeks    Status  On-going      PT LONG TERM GOAL #5   Title  Patient will report no vertigo with provoking motions or positions in order to be able to perform ADLs and mobility tasks without symptoms.    Baseline  8/10 dizziness with provoking motions; 11/19 8/10; 10/18/2019: 7/10; 10/25/2019-patient reports 6-7/10 vertigo with N & V    Time  6    Period  Weeks    Status  New    Target Date  12/06/19      PT LONG TERM GOAL #6   Title  Patient will reduce worst pain to 0/10 with gait on stairs for reduced disability with gait.    Baseline  07/28/2019 2/10 on stairs; 09/01/2019: 8/10; 10/18/2019: 7/10    Time  6    Period  Weeks    Status  On-going      PT LONG TERM GOAL #7   Title  Patient will perform leg press on RLE alone with 40# for 10 reps to demonstrate partity with LLE and prevent long-term increased stress on LLE.    Baseline  RLE 20# x 10 reps; 09/01/2019: 20# x 10 reps; 10/18/2019: 25# x 10    Time  6    Period  Weeks    Status  On-going            Plan - 11/08/19 0948    Clinical Impression Statement  Patient with reports that she has been trying to do the self-Epley maneuver at home but states she has difficulty getting through all of the treatment positions due to her symptoms. Patient with continued positive right Dix-hallpike test this date and patient was able to tolerate 3 Epley maneuvers this date. Patient would benefit from continued PT services until clearance of particles is obtained.    Personal  Factors and Comorbidities  Age;Comorbidity 3+;Fitness    Comorbidities  CAD, vertigo, HTN, L shoulder pain/weakness    Examination-Activity Limitations  Bathing;Locomotion Level;Bend;Squat;Stairs;Stand;Dressing    Examination-Participation Restrictions  Cleaning;Community Activity;Other   Exercise   Stability/Clinical Decision Making  Evolving/Moderate complexity    Rehab Potential  Good    PT Frequency  1x / week    PT Duration  4 weeks    PT Treatment/Interventions  ADLs/Self Care Home Management;Aquatic Therapy;Cryotherapy;Electrical Stimulation;Moist Heat;DME Instruction;Gait training;Stair training;Functional mobility training;Therapeutic activities;Therapeutic exercise;Balance training;Neuromuscular re-education;Patient/family education;Manual techniques;Passive range of motion;Dry needling;Energy conservation;Taping;Joint Manipulations;Canalith Repostioning    PT Next Visit Plan  Quad therex against band, leg press; hip extension therex; STS; hip kickers    PT Home Exercise Plan  LAQ against green theraband, standing hip abd    Consulted and Agree with Plan of Care  Patient       Patient will benefit from skilled therapeutic intervention in order to improve the following deficits and impairments:  Abnormal gait, Decreased coordination, Decreased range of motion, Difficulty walking, Decreased endurance, Obesity, Decreased activity tolerance, Pain, Decreased balance, Hypomobility, Improper body mechanics, Impaired flexibility, Decreased mobility, Decreased strength, Increased edema, Postural dysfunction  Visit Diagnosis: Dizziness and giddiness  Other abnormalities of gait and mobility     Problem List Patient Active Problem List   Diagnosis Date Noted  . Thrombocytopenia ([facility]) 04/08/2019  .  HTN (hypertension) 07/05/2018  . Tobacco use 07/05/2018  . S/P drug eluting coronary stent placement 03/15/2018  . Unstable angina ([place]) 02/24/2018  . NSTEMI (non-ST elevated myocardial  infarction) ([place]) 02/24/2018  . Full thickness rotator cuff tear 07/31/2017   Lady Deutscher PT, DPT [phone] Lady Deutscher 11/09/2019, 5:04 PM  [place] MAIN [facility] SERVICES [address] [place], [place], [address] Phone: [phone]   Fax:  [phone]  Name: SAMEERA BETTON MRN: [id number] Date of Birth: [birth year]

## 2019-11-15 ENCOUNTER — Other Ambulatory Visit: Payer: Self-pay

## 2019-11-15 ENCOUNTER — Encounter: Payer: Self-pay | Admitting: Physical Therapy

## 2019-11-15 ENCOUNTER — Ambulatory Visit: Payer: Medicaid Other | Admitting: Physical Therapy

## 2019-11-15 DIAGNOSIS — R42 Dizziness and giddiness: Secondary | ICD-10-CM

## 2019-11-15 DIAGNOSIS — R2689 Other abnormalities of gait and mobility: Secondary | ICD-10-CM

## 2019-11-15 NOTE — Therapy (Signed)
[facility] MAIN [facility] SERVICES [address] [place], [place], [address] Phone: [phone]   Fax:  [phone]  Physical Therapy Treatment  Patient Details  Name: Karen Potts MRN: [id number] Date of Birth: [birth year] Referring Provider (PT): Toy Cookey MD   Encounter Date: 11/15/2019  PT End of Session - 11/15/19 0950    Visit Number  40    Number of Visits  46    Date for PT Re-Evaluation  12/06/19    Authorization Type  09/13/2019 - 10/25/2019 8/11 PT visits    PT Start Time  0946    PT Stop Time  1028    PT Time Calculation (min)  42 min    Activity Tolerance  Other (comment);Patient tolerated treatment well   nausea   Behavior During Therapy  [facility] for tasks assessed/performed       Past Medical History:  Diagnosis Date  . Diabetes mellitus without complication (HCC)   . Hypertension   . Sleep apnea   . Vertigo     Past Surgical History:  Procedure Laterality Date  . ABDOMINAL HYSTERECTOMY    . BREAST BIOPSY Left 10/2017    APOCRINE TYPE CYST WITH FIBROSIS Of the Wall  . CORONARY STENT INTERVENTION N/A 02/24/2018   Procedure: CORONARY STENT INTERVENTION;  Surgeon: Marcina Millard, MD;  Location: [facility] INVASIVE CV LAB;  Service: Cardiovascular;  Laterality: N/A;  . LEFT HEART CATH AND CORONARY ANGIOGRAPHY Right 02/24/2018   Procedure: LEFT HEART CATH AND CORONARY ANGIOGRAPHY;  Surgeon: Marcina Millard, MD;  Location: [facility] INVASIVE CV LAB;  Service: Cardiovascular;  Laterality: Right;  . SHOULDER ARTHROSCOPY WITH OPEN ROTATOR CUFF REPAIR Left 08/14/2017   Procedure: SHOULDER ARTHROSCOPY WITH ROTATOR CUFF REPAIR, BICEPS TENOTOMY, SUBACROMIAL DECOMPRESSION;  Surgeon: Lyndle Herrlich, MD;  Location: [facility] ORS;  Service: Orthopedics;  Laterality: Left;  . UMBILICAL HERNIA REPAIR      There were no vitals filed for this visit.  Subjective Assessment - 11/15/19 0948    Subjective  Patient states that she had an  episode of vertigo when she looked up this past week. Patient states she turned her head to look at the clock in bed and she got whoozy.    Pertinent History  Onset [name] 2020 during treadmill training as part of cardiac rehabilitation. Cortisone shots provide relief for "a couple months"; last shot in June, relief until September. Attempting to increase exercise for weight loss and cardiac health but currently able to walk about "one block" before requiring rest due to knee pain. Recently purchased [facility] for assistance with ambulation. Pain 5/10, 10/10 worst, 0/10 best, with pain in R hip and LBP with increased activity. Agg: walking, standing. Ease: Voltaren, Aspirin, rest. Does not bother her in non-WB. Comorbidities: CAD, HTN, smoker, occasional vertigo "comes and goes every few months", DM2; L shoulder rotator cuff repair with fair recovery.    Limitations  Walking;Standing    How long can you sit comfortably?  unlimited    How long can you stand comfortably?  5 min    How long can you walk comfortably?  20 min    Diagnostic tests  x-ray (per pt report; the cartilage was "gone" on B knees)    Patient Stated Goals  walk with less pain to lose weight    Pain Onset  More than a month ago       Canalith Repositioning Maneuver: On mat table, performed right Dix-Hallpike testing and it was positive with right  upbeating torsional nystagmus of short duration without latency. Performed right canalith repositioning maneuver (CRT). Repeated right CRT for a total of 3 maneuvers with retesting between maneuvers. Patient reports 3/10 vertigo with first and third and 1/10 with the second maneuvers.  Noted again that when patient turns her head from the right to the left side during Epley maneuver that a reversal of the nystagmus was observed which might indicate positive left BPPV.     PT Education - 11/15/19 0950    Education Details  discussed left and right BPPV and treatment    Person(s) Educated  Patient     Methods  Explanation    Comprehension  Verbalized understanding       PT Short Term Goals - 10/18/19 1407      PT SHORT TERM GOAL #1   Title  Patient will be independent with HEP as adjunct to clinical therapy and to reduce number of visits.    Baseline  HEP given    Time  2    Period  Weeks    Status  Achieved    Target Date  05/18/19      PT SHORT TERM GOAL #2   Title  Patient will demonstrate ability to perform correct gait pattern with SPC 100% of the time for improved energy conservation and functional capacity with ambulation and ADLs.    Baseline  Correct gait pattern 50% of the time    Time  2    Period  Weeks    Status  Achieved    Target Date  05/18/19      PT SHORT TERM GOAL #3   Title  Patient will report 50% or greater improvement in her symptoms of dizziness and imbalance with provoking motions/positions    Baseline  Vertigo 8/10; 08/04/2019: 90% improvement    Time  2    Period  Weeks    Status  Achieved    Target Date  05/30/19        PT Long Term Goals - 10/25/19 1855      PT LONG TERM GOAL #1   Title  Patient will improve speed to 0.8 m/sec to achieve community ambulator status and reduce fall risks.    Baseline  0.69 m/sec with SPC; 11/19 0.74; 12/21 deferred; 07/28/2019 0.878    Time  8    Period  Weeks    Status  Achieved      PT LONG TERM GOAL #2   Title  Patient will improve LEFS score by 9 points to achieve MCID for reduced disability and improved function.    Baseline  LEFS 25; 11/19 could not assess; 12/21 deferred for time; 07/28/2019 deferred for time; 09/01/2019: LEFS: 30; 10/18/2019: LEFS: 30    Time  8    Period  Weeks    Status  On-going      PT LONG TERM GOAL #3   Title  Patient will reduce worst pain to 6/10 to demonstrate reduced disability with ADLs and improved functional capacity.    Baseline  10/10 worst pain; 11/19 0/10; 12/21 5/10; 07/28/2019 2/10; 10/18/2019: 7/10   pain 0/10 following cortisol injection   Time  8     Period  Weeks    Status  On-going      PT LONG TERM GOAL #4   Title  Patient will report walking tolerance of 45 minutes for for return to cardiac walking program.    Baseline  walking tolerance 20 min; 11/19 20  min secondary to vertigo; 12/21 20 min; 07/28/2019 cannot assess; 09/01/2019: 20 min; 10/18/2019: 20 min    Time  8    Period  Weeks    Status  On-going      PT LONG TERM GOAL #5   Title  Patient will report no vertigo with provoking motions or positions in order to be able to perform ADLs and mobility tasks without symptoms.    Baseline  8/10 dizziness with provoking motions; 11/19 8/10; 10/18/2019: 7/10; 10/25/2019-patient reports 6-7/10 vertigo with N & V    Time  6    Period  Weeks    Status  New    Target Date  12/06/19      PT LONG TERM GOAL #6   Title  Patient will reduce worst pain to 0/10 with gait on stairs for reduced disability with gait.    Baseline  07/28/2019 2/10 on stairs; 09/01/2019: 8/10; 10/18/2019: 7/10    Time  6    Period  Weeks    Status  On-going      PT LONG TERM GOAL #7   Title  Patient will perform leg press on RLE alone with 40# for 10 reps to demonstrate partity with LLE and prevent long-term increased stress on LLE.    Baseline  RLE 20# x 10 reps; 09/01/2019: 20# x 10 reps; 10/18/2019: 25# x 10    Time  6    Period  Weeks    Status  On-going            Plan - 11/15/19 1030    Clinical Impression Statement  Patient with positive right BPPV this date but with less vigorous nystagmus noted and patient reports 3/10 vertigo which is less than prior visits. However, did observe in left head turned position during the Epley maneuver a reversal of the nystagmus which could indicate positive left BPPV. Patient would benefit from continued PT services to continue to address vertigo symptoms and Epley maneuvers if indicated.    Personal Factors and Comorbidities  Age;Comorbidity 3+;Fitness    Comorbidities  CAD, vertigo, HTN, L shoulder pain/weakness     Examination-Activity Limitations  Bathing;Locomotion Level;Bend;Squat;Stairs;Stand;Dressing    Examination-Participation Restrictions  Cleaning;Community Activity;Other   Exercise   Stability/Clinical Decision Making  Evolving/Moderate complexity    Rehab Potential  Good    PT Frequency  1x / week    PT Duration  4 weeks    PT Treatment/Interventions  ADLs/Self Care Home Management;Aquatic Therapy;Cryotherapy;Electrical Stimulation;Moist Heat;DME Instruction;Gait training;Stair training;Functional mobility training;Therapeutic activities;Therapeutic exercise;Balance training;Neuromuscular re-education;Patient/family education;Manual techniques;Passive range of motion;Dry needling;Energy conservation;Taping;Joint Manipulations;Canalith Repostioning    PT Next Visit Plan  Quad therex against band, leg press; hip extension therex; STS; hip kickers    PT Home Exercise Plan  LAQ against green theraband, standing hip abd    Consulted and Agree with Plan of Care  Patient       Patient will benefit from skilled therapeutic intervention in order to improve the following deficits and impairments:  Abnormal gait, Decreased coordination, Decreased range of motion, Difficulty walking, Decreased endurance, Obesity, Decreased activity tolerance, Pain, Decreased balance, Hypomobility, Improper body mechanics, Impaired flexibility, Decreased mobility, Decreased strength, Increased edema, Postural dysfunction  Visit Diagnosis: Dizziness and giddiness  Other abnormalities of gait and mobility     Problem List Patient Active Problem List   Diagnosis Date Noted  . Thrombocytopenia ([place]) 04/08/2019  . HTN (hypertension) 07/05/2018  . Tobacco use 07/05/2018  . S/P drug eluting coronary stent placement  03/15/2018  . Unstable angina (HCC) 02/24/2018  . NSTEMI (non-ST elevated myocardial infarction) (HCC) 02/24/2018  . Full thickness rotator cuff tear 07/31/2017   Mardelle Matte PT, DPT [phone] Mardelle Matte 11/15/2019, 10:34 AM  [facility] MAIN [facility] SERVICES [address] [place], [place], [address] Phone: [phone]   Fax:  [phone]  Name: ANALINA FILLA MRN: [id number] Date of Birth: 1961/04/05

## 2019-11-22 ENCOUNTER — Ambulatory Visit: Payer: Medicaid Other | Admitting: Physical Therapy

## 2019-11-22 ENCOUNTER — Other Ambulatory Visit: Payer: Self-pay

## 2019-11-22 ENCOUNTER — Encounter: Payer: Self-pay | Admitting: Physical Therapy

## 2019-11-22 DIAGNOSIS — R2689 Other abnormalities of gait and mobility: Secondary | ICD-10-CM

## 2019-11-22 DIAGNOSIS — R42 Dizziness and giddiness: Secondary | ICD-10-CM | POA: Diagnosis not present

## 2019-11-22 NOTE — Therapy (Signed)
[place] MAIN [facility] SERVICES [address] [place], [place], [address] Phone: [phone]   Fax:  [phone]  Physical Therapy Treatment/Discharge Summary Dates of service: 05/04/2019-11/22/2019 Total number of visits: 41  Patient Details  Name: Karen Potts MRN: [id number] Date of Birth: 1961/06/30 Referring Provider (PT): Gennette Pac MD   Encounter Date: 11/22/2019  PT End of Session - 11/23/19 1636    Visit Number  41    Number of Visits  46    Date for PT Re-Evaluation  12/06/19    Authorization Type  09/13/2019 - 10/25/2019 8/11 PT visits    PT Start Time  0953    PT Stop Time  1031    PT Time Calculation (min)  38 min    Activity Tolerance  Other (comment)   nausea and vomiting   Behavior During Therapy  [facility] for tasks assessed/performed       Past Medical History:  Diagnosis Date  . Diabetes mellitus without complication ([place])   . Hypertension   . Sleep apnea   . Vertigo     Past Surgical History:  Procedure Laterality Date  . ABDOMINAL HYSTERECTOMY    . BREAST BIOPSY Left 10/2017    APOCRINE TYPE CYST WITH FIBROSIS Of the Wall  . CORONARY STENT INTERVENTION N/A 02/24/2018   Procedure: CORONARY STENT INTERVENTION;  Surgeon: Isaias Cowman, MD;  Location: [place] CV LAB;  Service: Cardiovascular;  Laterality: N/A;  . LEFT HEART CATH AND CORONARY ANGIOGRAPHY Right 02/24/2018   Procedure: LEFT HEART CATH AND CORONARY ANGIOGRAPHY;  Surgeon: Isaias Cowman, MD;  Location: [place] CV LAB;  Service: Cardiovascular;  Laterality: Right;  . SHOULDER ARTHROSCOPY WITH OPEN ROTATOR CUFF REPAIR Left 08/14/2017   Procedure: SHOULDER ARTHROSCOPY WITH ROTATOR CUFF REPAIR, BICEPS TENOTOMY, SUBACROMIAL DECOMPRESSION;  Surgeon: Lovell Sheehan, MD;  Location: [facility] ORS;  Service: Orthopedics;  Laterality: Left;  . UMBILICAL HERNIA REPAIR      There were no vitals filed for this visit.  Subjective Assessment -  11/22/19 0958    Subjective  Patient reports that at night time her head gets woozy and she tries to turn her head slowly.    Pertinent History  Onset [name] 2020 during treadmill training as part of cardiac rehabilitation. Cortisone shots provide relief for "a couple months"; last shot in June, relief until September. Attempting to increase exercise for weight loss and cardiac health but currently able to walk about "one block" before requiring rest due to knee pain. Recently purchased [facility] for assistance with ambulation. Pain 5/10, 10/10 worst, 0/10 best, with pain in R hip and LBP with increased activity. Agg: walking, standing. Ease: Voltaren, Aspirin, rest. Does not bother her in non-WB. Comorbidities: CAD, HTN, smoker, occasional vertigo "comes and goes every few months", DM2; L shoulder rotator cuff repair with fair recovery.    Limitations  Walking;Standing    How long can you sit comfortably?  unlimited    How long can you stand comfortably?  5 min    How long can you walk comfortably?  20 min    Diagnostic tests  x-ray (per pt report; the cartilage was "gone" on B knees)    Patient Stated Goals  walk with less pain to lose weight    Pain Onset  More than a month ago       Canalith Repositioning Maneuver:  On mat table, performed right Dix-Hallpike testing and it was positive with right upbeating torsional nystagmus of short  duration without latency.  Performed right canalith repositioning maneuver (CRT). Again noted that when turning head from right rotated position to left rotated position in the Epley maneuver position that patient had reversal of the nystagmus to left upbeating which could be indicative of potentially positive left BPPV as well as positive right BPPV.  Patient reported 5/10 vertigo with first maneuver and patient reported nausea.  On mat table, performed left Dix-Hallpike testing and it was positive with left upbeating torsional nystagmus of short duration with few  seconds latency.  Performed left canalith repositioning maneuver (CRT).  Patient reported 6/10 vertigo with first maneuver and then patient began to experience nausea and vomiting.   Patient was only able to tolerate two maneuvers this date secondary to nausea and vomiting.     PT Education - 11/23/19 1636    Education Details  discussed discharge plans; discussed [facility]    Person(s) Educated  Patient    Methods  Explanation    Comprehension  Verbalized understanding       PT Short Term Goals - 10/18/19 1407      PT SHORT TERM GOAL #1   Title  Patient will be independent with HEP as adjunct to clinical therapy and to reduce number of visits.    Baseline  HEP given    Time  2    Period  Weeks    Status  Achieved    Target Date  05/18/19      PT SHORT TERM GOAL #2   Title  Patient will demonstrate ability to perform correct gait pattern with SPC 100% of the time for improved energy conservation and functional capacity with ambulation and ADLs.    Baseline  Correct gait pattern 50% of the time    Time  2    Period  Weeks    Status  Achieved    Target Date  05/18/19      PT SHORT TERM GOAL #3   Title  Patient will report 50% or greater improvement in her symptoms of dizziness and imbalance with provoking motions/positions    Baseline  Vertigo 8/10; 08/04/2019: 90% improvement    Time  2    Period  Weeks    Status  Achieved    Target Date  05/30/19        PT Long Term Goals - 11/23/19 1643      PT LONG TERM GOAL #1   Title  Patient will improve 10MW speed to 0.8 m/sec to achieve community ambulator status and reduce fall risks.    Baseline  0.69 m/sec with SPC; 11/19 0.74; 12/21 deferred; 07/28/2019 0.878    Time  8    Period  Weeks    Status  Achieved      PT LONG TERM GOAL #2   Title  Patient will improve LEFS score by 9 points to achieve MCID for reduced disability and improved function.    Baseline  LEFS 25; 11/19 could not assess; 12/21 deferred  for time; 07/28/2019 deferred for time; 09/01/2019: LEFS: 30; 10/18/2019: LEFS: 30    Time  8    Period  Weeks    Status  Partially Met      PT LONG TERM GOAL #3   Title  Patient will reduce worst pain to 6/10 to demonstrate reduced disability with ADLs and improved functional capacity.    Baseline  10/10 worst pain; 11/19 0/10; 12/21 5/10; 07/28/2019 2/10; 10/18/2019: 7/10   pain 0/10 following  cortisol injection   Time  8    Period  Weeks    Status  Partially Met      PT LONG TERM GOAL #4   Title  Patient will report walking tolerance of 45 minutes for for return to cardiac walking program.    Baseline  walking tolerance 20 min; 11/19 20 min secondary to vertigo; 12/21 20 min; 07/28/2019 cannot assess; 09/01/2019: 20 min; 10/18/2019: 20 min    Time  8    Period  Weeks    Status  Not Met      PT LONG TERM GOAL #5   Title  Patient will report no vertigo with provoking motions or positions in order to be able to perform ADLs and mobility tasks without symptoms.    Baseline  8/10 dizziness with provoking motions; 11/19 8/10; 10/18/2019: 7/10; 10/25/2019-patient reports 6-7/10 vertigo with N & V    Time  6    Period  Weeks    Status  Not Met      PT LONG TERM GOAL #6   Title  Patient will reduce worst pain to 0/10 with gait on stairs for reduced disability with gait.    Baseline  07/28/2019 2/10 on stairs; 09/01/2019: 8/10; 10/18/2019: 7/10    Time  6    Period  Weeks    Status  Not Met      PT LONG TERM GOAL #7   Title  Patient will perform leg press on RLE alone with 40# for 10 reps to demonstrate partity with LLE and prevent long-term increased stress on LLE.    Baseline  RLE 20# x 10 reps; 09/01/2019: 20# x 10 reps; 10/18/2019: 25# x 10    Time  6    Period  Weeks    Status  Not Met            Plan - 11/23/19 1638    Clinical Impression Statement  Patient returns to clinic with complaints of ongoing dizziness symptoms. Patient with positive bilateral Dix-Hallpike tests and performed  Epley maneuvers. Patient only able to tolerate two maneuvers this date secondary to nausea and vomiting. Patient has met 3/3 short term goals as set on plan of care. Patient has met 1/7, partially met 2/7 and not met 4/7 long term goals as set on plan of care. Patient would benefit from continued PT services to further address vertigo and functional deficits however, patient's Medicaid benefits have exhausted at this time. Discussed the [facility] and patient states that she would like to explore that option. Will plan on discharging patient from PT services at this time as patient's Medicaid visits have exhausted and patient is unable to do private pay for services. Contacted representative from the [facility] to recommend patient for the free [facility].    Personal Factors and Comorbidities  Age;Comorbidity 3+;Fitness    Comorbidities  CAD, vertigo, HTN, L shoulder pain/weakness    Examination-Activity Limitations  Bathing;Locomotion Level;Bend;Squat;Stairs;Stand;Dressing    Examination-Participation Restrictions  Cleaning;Community Activity;Other   Exercise   Stability/Clinical Decision Making  Evolving/Moderate complexity    Rehab Potential  Good    PT Frequency  1x / week    PT Duration  4 weeks    PT Treatment/Interventions  ADLs/Self Care Home Management;Aquatic Therapy;Cryotherapy;Electrical Stimulation;Moist Heat;DME Instruction;Gait training;Potts training;Functional mobility training;Therapeutic activities;Therapeutic exercise;Balance training;Neuromuscular re-education;Patient/family education;Manual techniques;Passive range of motion;Dry needling;Energy conservation;Taping;Joint Manipulations;Canalith Repostioning    PT Next Visit Plan  Quad therex against band, leg  press; hip extension therex; STS; hip kickers    PT Home Exercise Plan  LAQ against green theraband, standing hip abd    Consulted and Agree with Plan of Care  Patient       Patient will benefit from skilled  therapeutic intervention in order to improve the following deficits and impairments:  Abnormal gait, Decreased coordination, Decreased range of motion, Difficulty walking, Decreased endurance, Obesity, Decreased activity tolerance, Pain, Decreased balance, Hypomobility, Improper body mechanics, Impaired flexibility, Decreased mobility, Decreased strength, Increased edema, Postural dysfunction  Visit Diagnosis: Dizziness and giddiness  Other abnormalities of gait and mobility     Problem List Patient Active Problem List   Diagnosis Date Noted  . Thrombocytopenia ([place]) 04/08/2019  . HTN (hypertension) 07/05/2018  . Tobacco use 07/05/2018  . S/P drug eluting coronary stent placement 03/15/2018  . Unstable angina ([place]) 02/24/2018  . NSTEMI (non-ST elevated myocardial infarction) ([place]) 02/24/2018  . Full thickness rotator cuff tear 07/31/2017   Lady Deutscher PT, DPT [phone] Lady Deutscher 11/23/2019, 4:50 PM  [place] MAIN [facility] SERVICES [address] [place], [place], [address] Phone: [phone]   Fax:  [phone]  Name: Karen Potts MRN: [id number] Date of Birth: [birth year]

## 2019-12-06 IMAGING — MG DIGITAL DIAGNOSTIC UNILATERAL LEFT MAMMOGRAM
2 series · 2 of 2 positions shown · non-contrast
Comparison: Previous exam(s).

CLINICAL DATA: Status post ultrasound-guided core biopsy of the
left breast mass.

EXAM:
DIAGNOSTIC LEFT MAMMOGRAM POST ULTRASOUND BIOPSY

[L CC]
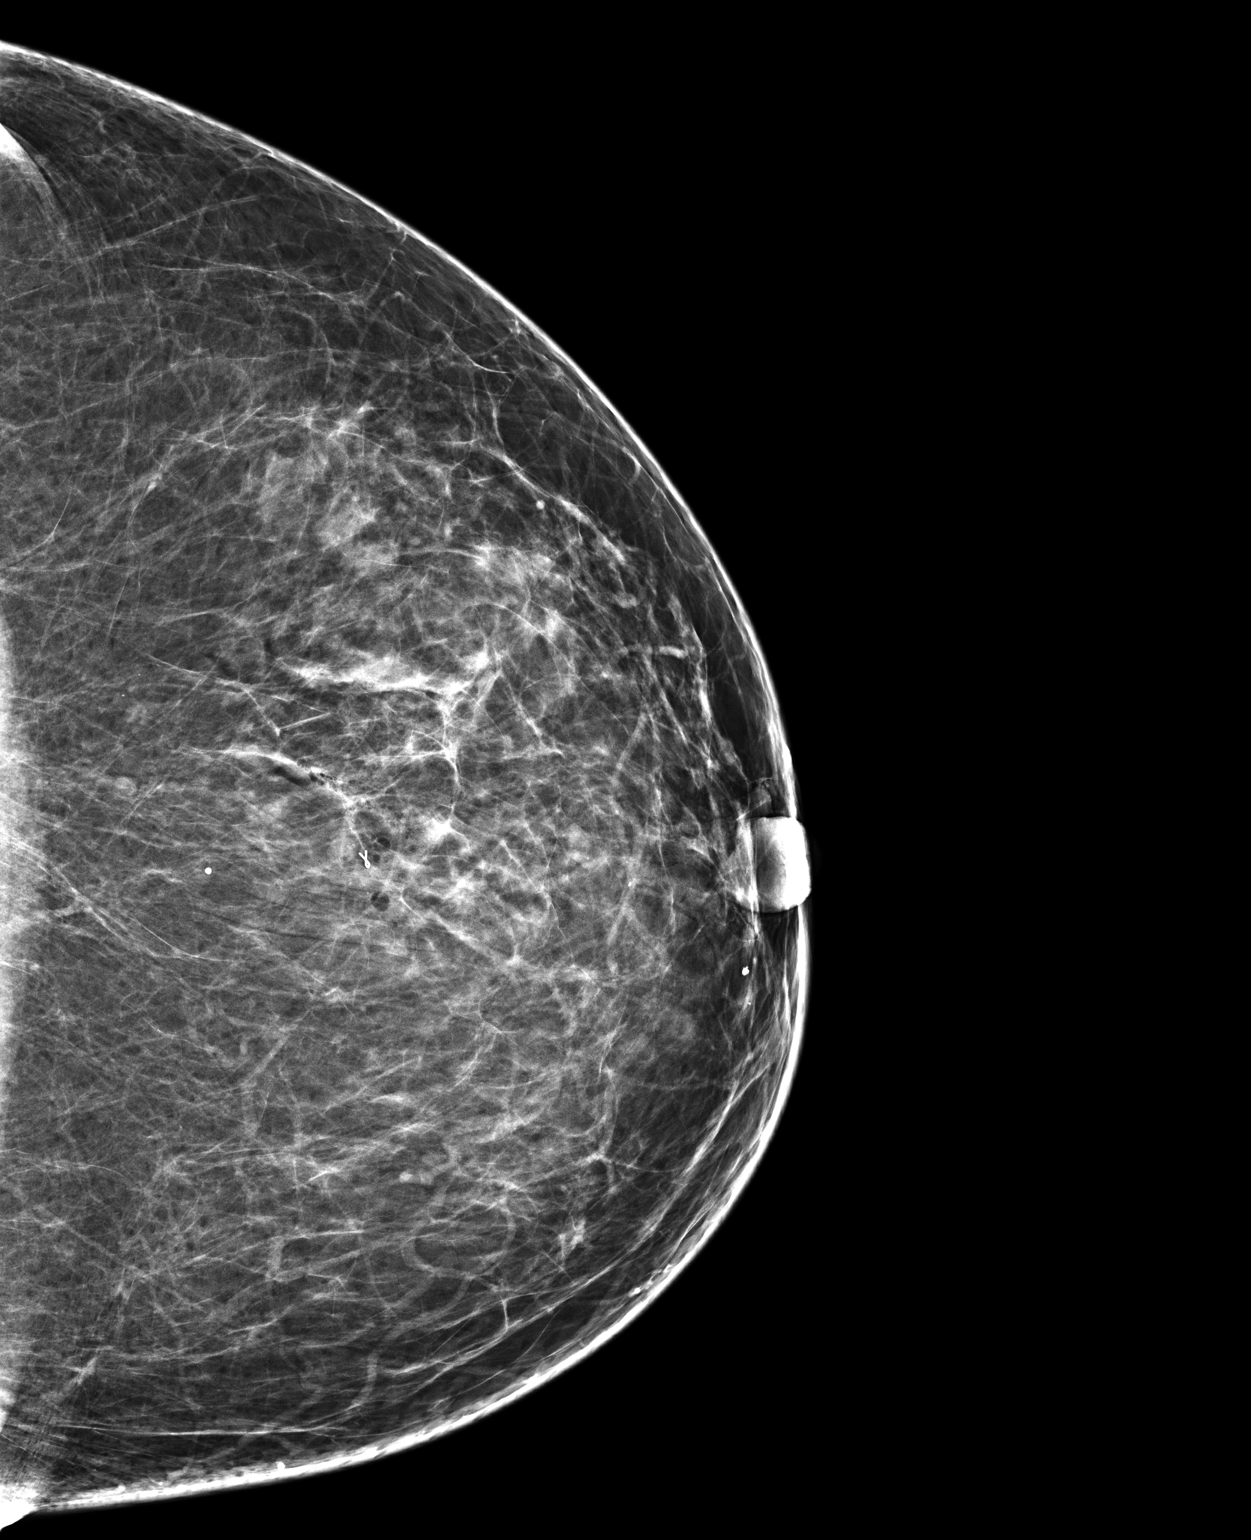

[L ML]
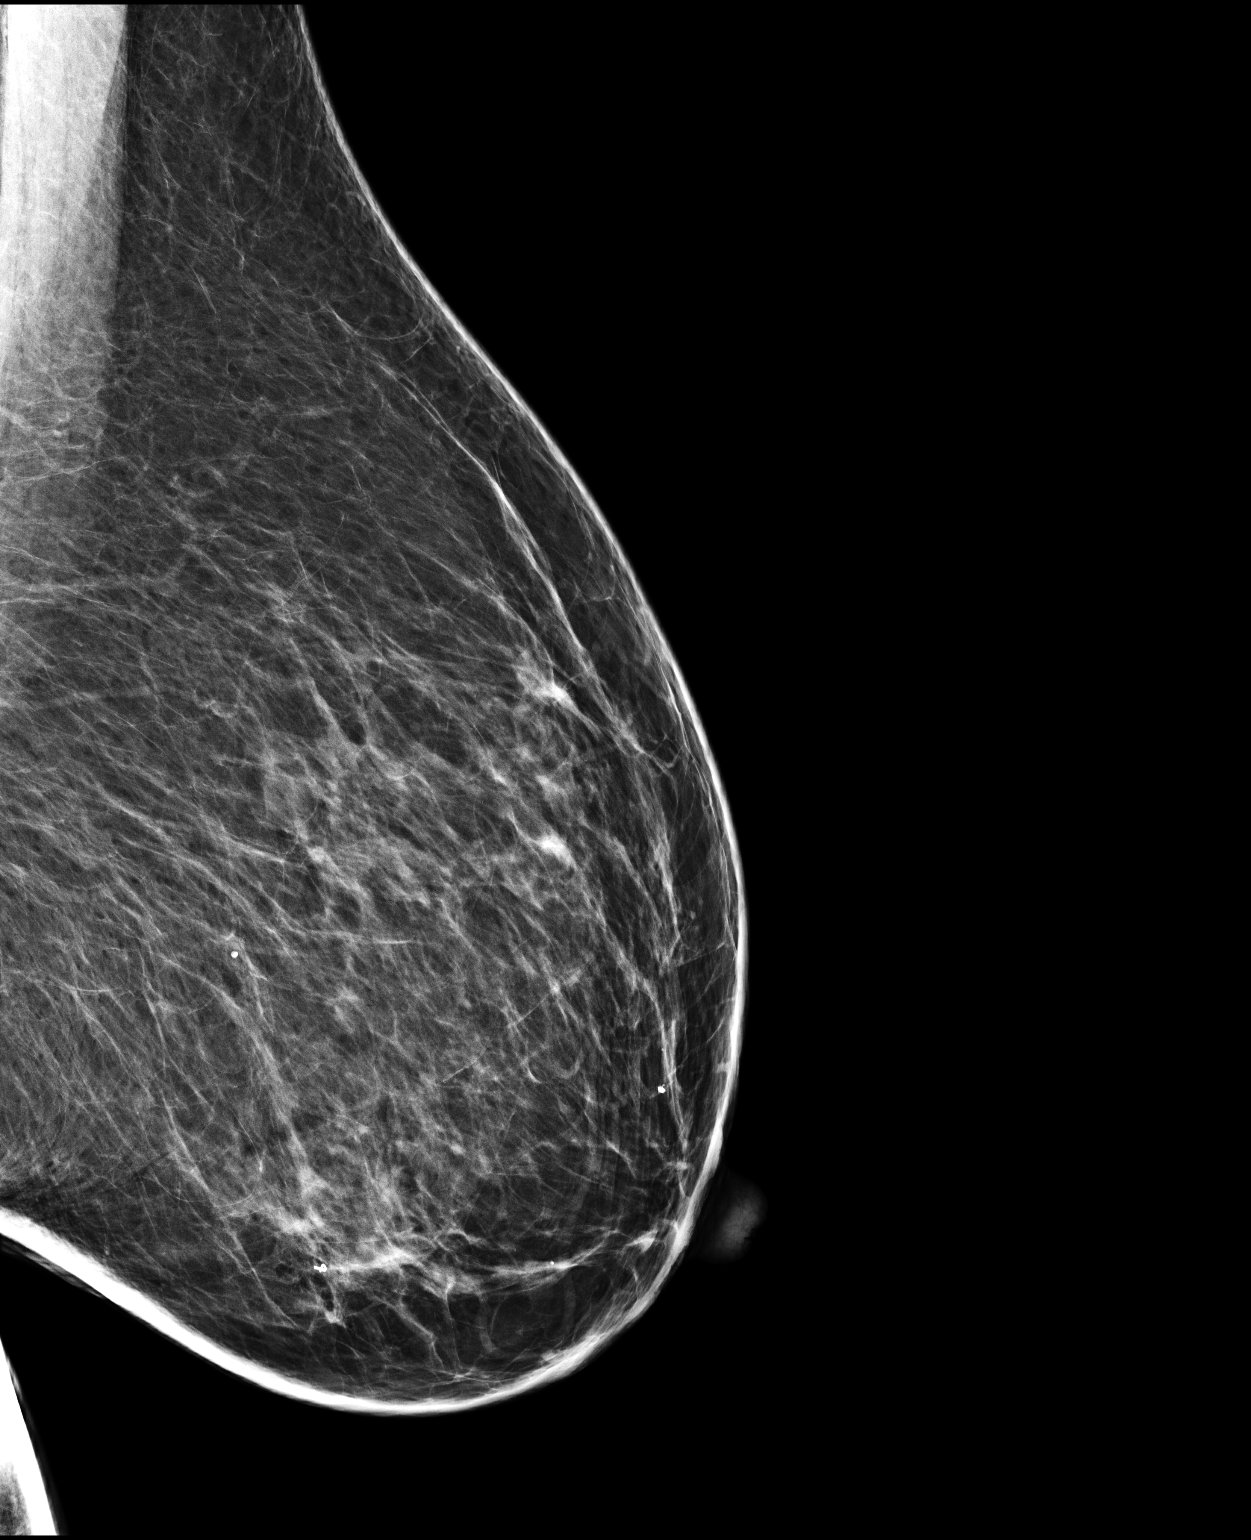

[2 of 2 positions shown; findings below may reference images not displayed]

FINDINGS: Mammographic images were obtained following ultrasound guided biopsy
of a mass in the 6 o'clock region of the left breast. Mammographic
images show there is a ribbon shaped clip in appropriate position in
the 6 o'clock region of the left breast.
IMPRESSION: Status post ultrasound-guided core biopsy of the left breast with
pathology pending.

Final Assessment: Post Procedure Mammograms for Marker Placement

## 2020-01-04 ENCOUNTER — Ambulatory Visit: Payer: Medicaid Other

## 2020-01-18 DIAGNOSIS — G4733 Obstructive sleep apnea (adult) (pediatric): Secondary | ICD-10-CM | POA: Insufficient documentation

## 2020-01-23 ENCOUNTER — Other Ambulatory Visit: Payer: Self-pay | Admitting: Family Medicine

## 2020-01-23 DIAGNOSIS — N63 Unspecified lump in unspecified breast: Secondary | ICD-10-CM

## 2020-02-17 ENCOUNTER — Inpatient Hospital Stay: Payer: Medicaid Other | Attending: Internal Medicine

## 2020-02-17 ENCOUNTER — Other Ambulatory Visit: Payer: Self-pay

## 2020-02-17 ENCOUNTER — Inpatient Hospital Stay (HOSPITAL_BASED_OUTPATIENT_CLINIC_OR_DEPARTMENT_OTHER): Payer: Medicaid Other | Admitting: Internal Medicine

## 2020-02-17 ENCOUNTER — Encounter: Payer: Self-pay | Admitting: Internal Medicine

## 2020-02-17 DIAGNOSIS — I10 Essential (primary) hypertension: Secondary | ICD-10-CM | POA: Insufficient documentation

## 2020-02-17 DIAGNOSIS — Z90710 Acquired absence of both cervix and uterus: Secondary | ICD-10-CM | POA: Diagnosis not present

## 2020-02-17 DIAGNOSIS — E119 Type 2 diabetes mellitus without complications: Secondary | ICD-10-CM | POA: Insufficient documentation

## 2020-02-17 DIAGNOSIS — Z7982 Long term (current) use of aspirin: Secondary | ICD-10-CM | POA: Diagnosis not present

## 2020-02-17 DIAGNOSIS — Z79899 Other long term (current) drug therapy: Secondary | ICD-10-CM | POA: Diagnosis not present

## 2020-02-17 DIAGNOSIS — Z7984 Long term (current) use of oral hypoglycemic drugs: Secondary | ICD-10-CM | POA: Diagnosis not present

## 2020-02-17 DIAGNOSIS — D696 Thrombocytopenia, unspecified: Secondary | ICD-10-CM

## 2020-02-17 DIAGNOSIS — G4730 Sleep apnea, unspecified: Secondary | ICD-10-CM | POA: Diagnosis not present

## 2020-02-17 DIAGNOSIS — Z87891 Personal history of nicotine dependence: Secondary | ICD-10-CM | POA: Insufficient documentation

## 2020-02-17 LAB — CBC WITH DIFFERENTIAL/PLATELET
Abs Immature Granulocytes: 0.02 10*3/uL (ref 0.00–0.07)
Basophils Absolute: 0 10*3/uL (ref 0.0–0.1)
Basophils Relative: 0 %
Eosinophils Absolute: 0.3 10*3/uL (ref 0.0–0.5)
Eosinophils Relative: 4 %
HCT: 41.5 % (ref 36.0–46.0)
Hemoglobin: 14.1 g/dL (ref 12.0–15.0)
Immature Granulocytes: 0 %
Lymphocytes Relative: 53 %
Lymphs Abs: 4.8 10*3/uL — ABNORMAL HIGH (ref 0.7–4.0)
MCH: 28.5 pg (ref 26.0–34.0)
MCHC: 34 g/dL (ref 30.0–36.0)
MCV: 83.8 fL (ref 80.0–100.0)
Monocytes Absolute: 0.6 10*3/uL (ref 0.1–1.0)
Monocytes Relative: 7 %
Neutro Abs: 3.3 10*3/uL (ref 1.7–7.7)
Neutrophils Relative %: 36 %
Platelets: 116 10*3/uL — ABNORMAL LOW (ref 150–400)
RBC: 4.95 MIL/uL (ref 3.87–5.11)
RDW: 14.8 % (ref 11.5–15.5)
Smear Review: DECREASED
WBC: 9.1 10*3/uL (ref 4.0–10.5)
nRBC: 0 % (ref 0.0–0.2)

## 2020-02-17 LAB — COMPREHENSIVE METABOLIC PANEL
ALT: 33 U/L (ref 0–44)
AST: 40 U/L (ref 15–41)
Albumin: 4 g/dL (ref 3.5–5.0)
Alkaline Phosphatase: 57 U/L (ref 38–126)
Anion gap: 8 (ref 5–15)
BUN: 9 mg/dL (ref 6–20)
CO2: 27 mmol/L (ref 22–32)
Calcium: 8.9 mg/dL (ref 8.9–10.3)
Chloride: 104 mmol/L (ref 98–111)
Creatinine, Ser: 0.72 mg/dL (ref 0.44–1.00)
GFR calc Af Amer: >60 mL/min (ref >60)
GFR calc non Af Amer: >60 mL/min (ref >60)
Glucose, Bld: 121 mg/dL — ABNORMAL HIGH (ref 70–99)
Potassium: 3.2 mmol/L — ABNORMAL LOW (ref 3.5–5.1)
Sodium: 139 mmol/L (ref 135–145)
Total Bilirubin: 0.9 mg/dL (ref 0.3–1.2)
Total Protein: 7.9 g/dL (ref 6.5–8.1)

## 2020-02-17 LAB — LACTATE DEHYDROGENASE: LDH: 132 U/L (ref 98–192)

## 2020-02-17 NOTE — Assessment & Plan Note (Addendum)
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  Platelets today are 116- STABLE.  Asymptomatic.  Would not recommend treatment unless less than 50 or symptomatic./See below  #Possible knee surgery [s/p cortisone shot; Dr.Bowers]; Appt in NOV.  Patient was asked to inform us if plan for surgery/will need hematology clearance.  # CAD/MI- [Aug 2019]- asprin; Effient Jocundus.Bianchi ] [Dr.Parschoes/]. next week; stress test.   #Smoker : 2 cig/day- not a candidate for LCSP.  Counseled to quit smoking.  # Peripheral neuropathy- ? Slightly low B12.  Recommend B12 oral.  If not improved then recommend IM injections.  # COVID BOOSTER:  As per CDC recommendation/FDA approval-I would recommend booster vaccine when available in September 2021.  Patient is interested.   # DISPOSITION: # follow up in 6 month-MD: labs- cbc/cmp;LDH/b12 leevls- Dr.B

## 2020-02-17 NOTE — Patient Instructions (Signed)
#   take B12 pills [1000 mcg/day]- over the counter.

## 2020-02-17 NOTE — Progress Notes (Signed)
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  In the interim patient continues to lose weight intentional ~20 pounds in the last many months.   She is currently awaiting repeat evaluation with orthopedics for possible upcoming knee surgery.   She complains of tingling and numbness in extremities.  States her hemoglobin A1c is improving.  She is not a diabetic.   Review of Systems  Constitutional: Positive for malaise/fatigue. Negative for chills, diaphoresis, fever and weight loss.  HENT: Negative for nosebleeds and sore throat.   Eyes: Negative for double vision.  Respiratory: Positive for shortness of breath. Negative for cough, hemoptysis, sputum production and wheezing.   Cardiovascular: Negative for chest pain, palpitations, orthopnea and leg swelling.  Gastrointestinal: Negative for abdominal pain, blood in stool, constipation, diarrhea, heartburn, melena, nausea and vomiting.  Genitourinary: Negative for dysuria, frequency and urgency.  Musculoskeletal: Positive for back pain and joint pain.  Skin: Negative.  Negative for itching and rash.  Neurological: Negative for dizziness, tingling, focal weakness, weakness and headaches.  Endo/Heme/Allergies: Does not bruise/bleed easily.  Psychiatric/Behavioral: Negative for depression. The patient is not nervous/anxious and does not have  insomnia.      MEDICAL HISTORY:  Past Medical History:  Diagnosis Date  . Diabetes mellitus without complication (HCC)   . Hypertension   . Sleep apnea   . Vertigo     SURGICAL HISTORY: Past Surgical History:  Procedure Laterality Date  . ABDOMINAL HYSTERECTOMY    . BREAST BIOPSY Left 10/2017    APOCRINE TYPE CYST WITH FIBROSIS Of the Wall  . CORONARY STENT INTERVENTION N/A 02/24/2018   Procedure: CORONARY STENT INTERVENTION;  Surgeon: Marcina Millard, MD;  Location: [facility] INVASIVE CV LAB;  Service: Cardiovascular;  Laterality: N/A;  . LEFT HEART CATH AND CORONARY ANGIOGRAPHY Right 02/24/2018   Procedure: LEFT HEART CATH AND CORONARY ANGIOGRAPHY;  Surgeon: Marcina Millard, MD;  Location: [facility] INVASIVE CV LAB;  Service: Cardiovascular;  Laterality: Right;  . SHOULDER ARTHROSCOPY WITH OPEN ROTATOR CUFF REPAIR Left 08/14/2017   Procedure: SHOULDER ARTHROSCOPY WITH ROTATOR CUFF REPAIR, BICEPS TENOTOMY, SUBACROMIAL DECOMPRESSION;  Surgeon: Lyndle Herrlich, MD;  Location: [facility] ORS;  Service: Orthopedics;  Laterality: Left;  . UMBILICAL HERNIA REPAIR      SOCIAL HISTORY: Social History   Socioeconomic History  . Marital status: Married    Spouse name: Not on file  . Number of children: Not on file  . Years of education: Not on file  . Highest education level: Not on file  Occupational History  . Not on file  Tobacco Use  . Smoking status: Former Smoker    Types: Cigarettes    Quit date: 02/24/2018    Years since quitting: 1.9  . Smokeless tobacco: Never Used  . Tobacco comment: Yoshino said she quit smoking the day of her heart attack.  Vaping Use  . Vaping Use: Never used  Substance and Sexual Activity  . Alcohol use: No  . Drug use: No  . Sexual  activity: Yes    Birth control/protection: Condom  Other Topics Concern  . Not on file  Social History Narrative   Not working/disability; smoking; no alcohol. In [place]/ lives with a friend.    Social  Determinants of Health   Financial Resource Strain:   . Difficulty of Paying Living Expenses: Not on file  Food Insecurity:   . Worried About Programme researcher, broadcasting/film/video in the Last Year: Not on file  . Ran Out of Food in the Last Year: Not on file  Transportation Needs:   . Lack of Transportation (Medical): Not on file  . Lack of Transportation (Non-Medical): Not on file  Physical Activity:   . Days of Exercise per Week: Not on file  . Minutes of Exercise per Session: Not on file  Stress:   . Feeling of Stress : Not on file  Social Connections:   . Frequency of Communication with Friends and Family: Not on file  . Frequency of Social Gatherings with Friends and Family: Not on file  . Attends Religious Services: Not on file  . Active Member of Clubs or Organizations: Not on file  . Attends Banker Meetings: Not on file  . Marital Status: Not on file  Intimate Partner Violence:   . Fear of Current or Ex-Partner: Not on file  . Emotionally Abused: Not on file  . Physically Abused: Not on file  . Sexually Abused: Not on file    FAMILY HISTORY: Family History  Problem Relation Age of Onset  . Breast cancer Neg Hx     ALLERGIES:  has No Known Allergies.  MEDICATIONS:  Current Outpatient Medications  Medication Sig Dispense Refill  . aspirin EC 81 MG tablet Take 81 mg by mouth daily.    Marland Kitchen atorvastatin (LIPITOR) 80 MG tablet Take 1 tablet (80 mg total) by mouth daily at 6 PM. 30 tablet 0  . diclofenac sodium (VOLTAREN) 1 % GEL Apply 2 g topically 4 (four) times daily as needed (pain in knee).    Marland Kitchen lisinopril-hydrochlorothiazide (PRINZIDE,ZESTORETIC) 10-12.5 MG tablet TAKE 1 TABLET BY MOUTH ONCE DAILY 90 tablet 1  . loratadine (CLARITIN) 10 MG tablet Take by mouth.    . meclizine (ANTIVERT) 12.5 MG tablet Take 1 tablet (12.5 mg total) by mouth 3 (three) times daily as needed for dizziness or nausea. 30 tablet 1  . meclizine (ANTIVERT) 25 MG tablet Take 1 tablet (25 mg  total) by mouth 3 (three) times daily as needed for dizziness. 30 tablet 0  . metFORMIN (GLUCOPHAGE) 500 MG tablet Take 500 mg by mouth 2 (two) times daily.     . nitroGLYCERIN (NITROSTAT) 0.4 MG SL tablet Place 1 tablet (0.4 mg total) under the tongue every 5 (five) minutes as needed for chest pain. (Patient not taking: Reported on 08/18/2019) 30 tablet 12   No current facility-administered medications for this visit.     Marland Kitchen  PHYSICAL EXAMINATION:   Vitals:   02/17/20 1022  BP: 135/70  Pulse: 68  Resp: 18  Temp: (!) 96.9 F (36.1 C)  SpO2: 100%   Filed Weights   02/17/20 1022  Weight: 275 lb (124.7 kg)    Physical Exam HENT:     Head: Normocephalic and atraumatic.     Mouth/Throat:     Pharynx: No oropharyngeal exudate.  Eyes:     Pupils: Pupils are equal, round, and reactive to light.  Cardiovascular:     Rate and Rhythm: Normal rate and regular rhythm.  Pulmonary:     Effort: No respiratory distress.     Breath sounds: No wheezing.  Abdominal:     General: Bowel sounds are normal. There is no distension.     Palpations: Abdomen is soft. There is no mass.     Tenderness: There is no abdominal tenderness. There is no guarding or rebound.  Musculoskeletal:        General: No tenderness. Normal range of motion.     Cervical back: Normal range of motion and neck supple.  Skin:    General: Skin is warm.  Neurological:     Mental Status: She is alert and oriented to person, place, and time.  Psychiatric:        Mood and Affect: Affect normal.      LABORATORY DATA:  I have reviewed the data as listed Lab Results  Component Value Date   WBC 9.1 02/17/2020   HGB 14.1 02/17/2020   HCT 41.5 02/17/2020   MCV 83.8 02/17/2020   PLT 116 (L) 02/17/2020   Recent Labs    08/19/19 1038 02/17/20 1011  NA 138 139  K 3.9 3.2*  CL 102 104  CO2 27 27  GLUCOSE 196* 121*  BUN 14 9  CREATININE 0.83 0.72  CALCIUM 9.2 8.9  GFRNONAA >60 >60  GFRAA >60 >60  PROT 7.5 7.9   ALBUMIN 3.8 4.0  AST 41 40  ALT 35 33  ALKPHOS 53 57  BILITOT 0.6 0.9     No results found.  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  Platelets today are 116- STABLE.  Asymptomatic.  Would not recommend treatment unless less than 50 or symptomatic./See below  #Possible knee surgery [s/p cortisone shot; Dr.Bowers]; Appt in NOV.  Patient was asked to inform us if plan for surgery/will need hematology clearance.  # CAD/MI- [Aug 2019]- asprin; Effient Jocundus.Bianchi ] [Dr.Parschoes/]. next week; stress test.   #Smoker : 2 cig/day- not a candidate for LCSP.  Counseled to quit smoking.  # Peripheral neuropathy- ? Slightly low B12.  Recommend B12 oral.  If not improved then recommend IM injections.  # COVID BOOSTER:  As per CDC recommendation/FDA approval-I would recommend booster vaccine when available in September 2021.  Patient is interested.   # DISPOSITION: # follow up in 6 month-MD: labs- cbc/cmp;LDH/b12 leevls- Dr.B    Earna Coder, MD 02/17/2020 11:25 AM

## 2020-02-17 NOTE — Addendum Note (Signed)
Addended by: Mercer Pod E on: 02/17/2020 03:58 PM   Modules accepted: Orders

## 2020-02-28 ENCOUNTER — Ambulatory Visit
Admission: RE | Admit: 2020-02-28 | Discharge: 2020-02-28 | Disposition: A | Discharge status: Home / Self Care | Payer: Medicaid Other | Source: Ambulatory Visit | Attending: Family Medicine | Admitting: Family Medicine

## 2020-02-28 ENCOUNTER — Other Ambulatory Visit: Payer: Self-pay

## 2020-02-28 DIAGNOSIS — N63 Unspecified lump in unspecified breast: Secondary | ICD-10-CM

## 2020-03-06 IMAGING — DX DG CHEST 1V PORT
1 series · 1 of 1 positions shown · non-contrast
Comparison: Portable exam at 6466 hours compared to 10/20/2016

CLINICAL DATA: Chest pain since this morning

EXAM:
PORTABLE CHEST 1 VIEW

[chest ap]
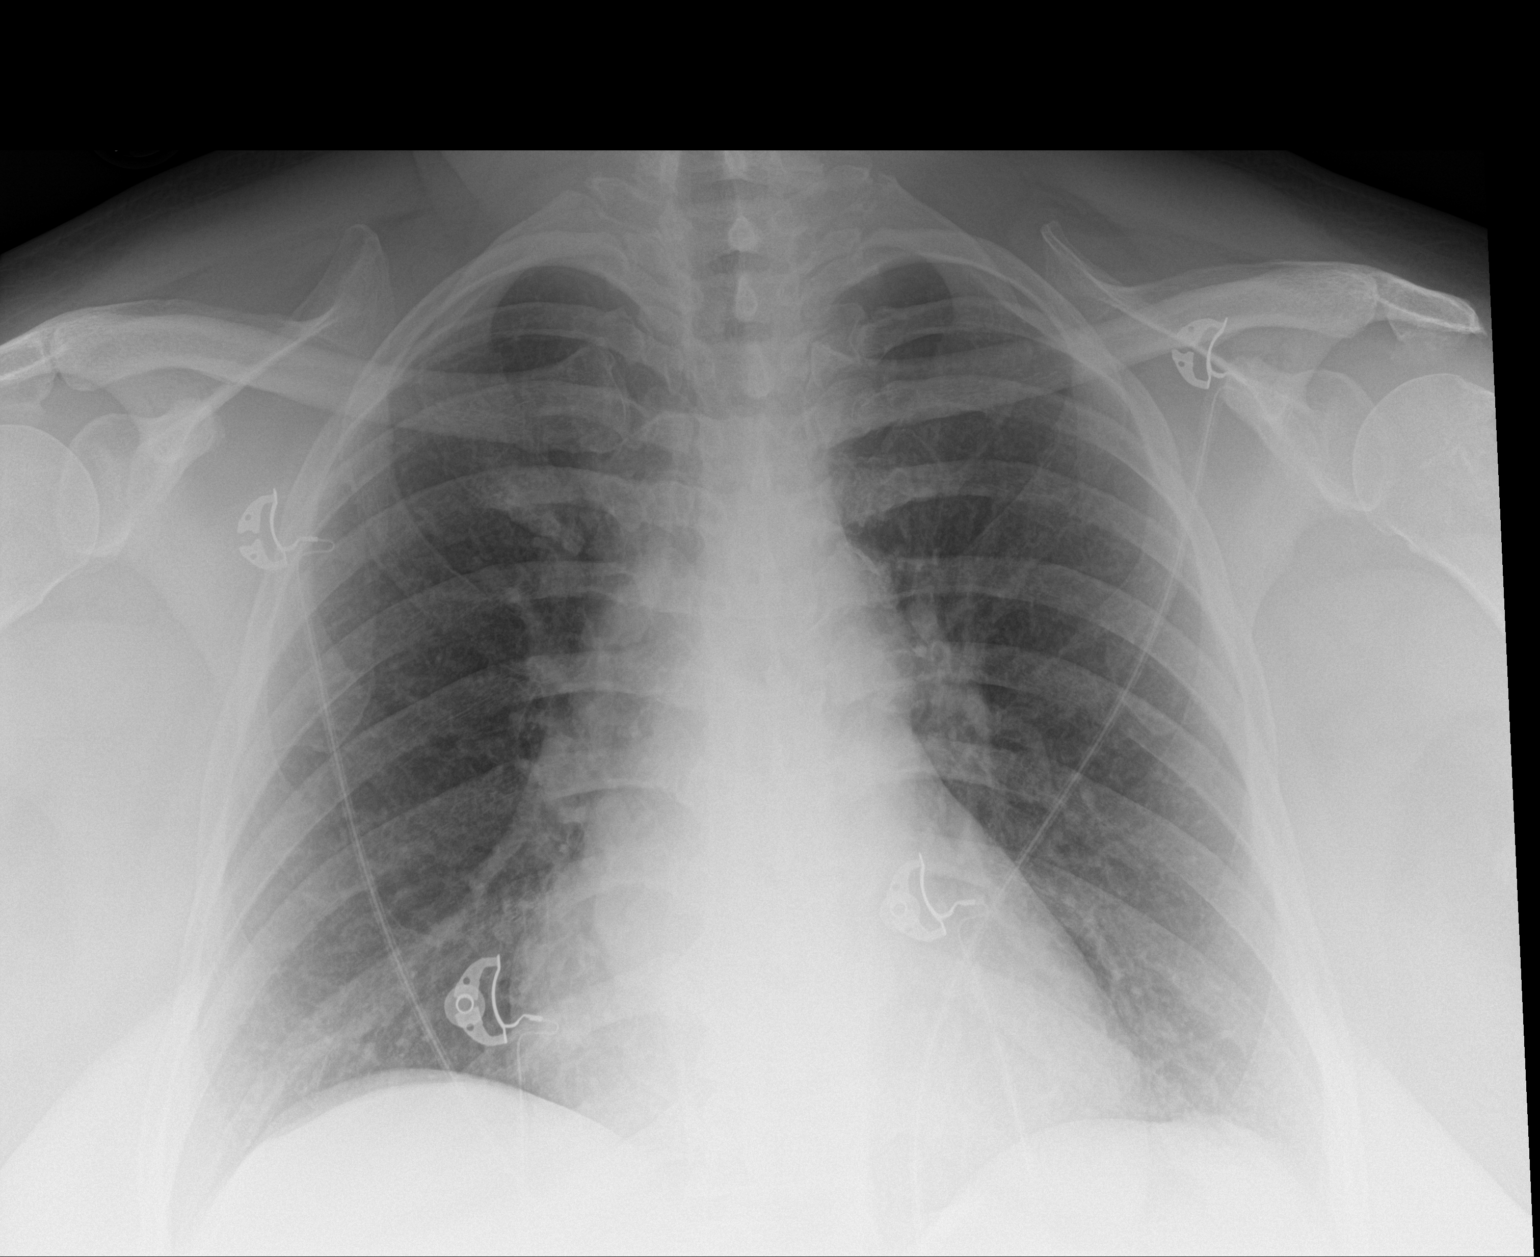

[1 of 1 positions shown; findings below may reference images not displayed]

FINDINGS: Normal heart size, mediastinal contours, and pulmonary vascularity.

Lungs clear.

No pleural effusion or pneumothorax.

Bones unremarkable.
IMPRESSION: No acute abnormalities.

## 2020-03-22 DIAGNOSIS — R202 Paresthesia of skin: Secondary | ICD-10-CM | POA: Insufficient documentation

## 2020-03-22 DIAGNOSIS — R200 Anesthesia of skin: Secondary | ICD-10-CM | POA: Insufficient documentation

## 2020-04-18 DIAGNOSIS — R262 Difficulty in walking, not elsewhere classified: Secondary | ICD-10-CM | POA: Insufficient documentation

## 2020-04-23 ENCOUNTER — Other Ambulatory Visit: Payer: Self-pay

## 2020-04-23 ENCOUNTER — Emergency Department
Admission: EM | Admit: 2020-04-23 | Discharge: 2020-04-23 | Disposition: A | Discharge status: Home / Self Care | Payer: Medicaid Other | Attending: Emergency Medicine | Admitting: Emergency Medicine

## 2020-04-23 DIAGNOSIS — I10 Essential (primary) hypertension: Secondary | ICD-10-CM | POA: Diagnosis not present

## 2020-04-23 DIAGNOSIS — Z7982 Long term (current) use of aspirin: Secondary | ICD-10-CM | POA: Diagnosis not present

## 2020-04-23 DIAGNOSIS — Z7984 Long term (current) use of oral hypoglycemic drugs: Secondary | ICD-10-CM | POA: Insufficient documentation

## 2020-04-23 DIAGNOSIS — F17210 Nicotine dependence, cigarettes, uncomplicated: Secondary | ICD-10-CM | POA: Insufficient documentation

## 2020-04-23 DIAGNOSIS — R112 Nausea with vomiting, unspecified: Secondary | ICD-10-CM

## 2020-04-23 DIAGNOSIS — Z79899 Other long term (current) drug therapy: Secondary | ICD-10-CM | POA: Insufficient documentation

## 2020-04-23 DIAGNOSIS — Z955 Presence of coronary angioplasty implant and graft: Secondary | ICD-10-CM | POA: Insufficient documentation

## 2020-04-23 DIAGNOSIS — K529 Noninfective gastroenteritis and colitis, unspecified: Secondary | ICD-10-CM

## 2020-04-23 DIAGNOSIS — I2510 Atherosclerotic heart disease of native coronary artery without angina pectoris: Secondary | ICD-10-CM | POA: Diagnosis not present

## 2020-04-23 DIAGNOSIS — R197 Diarrhea, unspecified: Secondary | ICD-10-CM

## 2020-04-23 DIAGNOSIS — E119 Type 2 diabetes mellitus without complications: Secondary | ICD-10-CM | POA: Insufficient documentation

## 2020-04-23 LAB — CBC WITH DIFFERENTIAL/PLATELET
Abs Immature Granulocytes: 0.04 10*3/uL (ref 0.00–0.07)
Basophils Absolute: 0 10*3/uL (ref 0.0–0.1)
Basophils Relative: 0 %
Eosinophils Absolute: 0.4 10*3/uL (ref 0.0–0.5)
Eosinophils Relative: 3 %
HCT: 41.9 % (ref 36.0–46.0)
Hemoglobin: 13.9 g/dL (ref 12.0–15.0)
Immature Granulocytes: 0 %
Lymphocytes Relative: 37 %
Lymphs Abs: 4.2 10*3/uL — ABNORMAL HIGH (ref 0.7–4.0)
MCH: 28.5 pg (ref 26.0–34.0)
MCHC: 33.2 g/dL (ref 30.0–36.0)
MCV: 85.9 fL (ref 80.0–100.0)
Monocytes Absolute: 0.8 10*3/uL (ref 0.1–1.0)
Monocytes Relative: 7 %
Neutro Abs: 6.2 10*3/uL (ref 1.7–7.7)
Neutrophils Relative %: 53 %
Platelets: 121 10*3/uL — ABNORMAL LOW (ref 150–400)
RBC: 4.88 MIL/uL (ref 3.87–5.11)
RDW: 14.6 % (ref 11.5–15.5)
WBC: 11.6 10*3/uL — ABNORMAL HIGH (ref 4.0–10.5)
nRBC: 0 % (ref 0.0–0.2)

## 2020-04-23 LAB — COMPREHENSIVE METABOLIC PANEL
ALT: 30 U/L (ref 0–44)
AST: 37 U/L (ref 15–41)
Albumin: 4.2 g/dL (ref 3.5–5.0)
Alkaline Phosphatase: 61 U/L (ref 38–126)
Anion gap: 12 (ref 5–15)
BUN: 17 mg/dL (ref 6–20)
CO2: 23 mmol/L (ref 22–32)
Calcium: 9.4 mg/dL (ref 8.9–10.3)
Chloride: 103 mmol/L (ref 98–111)
Creatinine, Ser: 0.83 mg/dL (ref 0.44–1.00)
GFR, Estimated: >60 mL/min (ref >60)
Glucose, Bld: 152 mg/dL — ABNORMAL HIGH (ref 70–99)
Potassium: 3.4 mmol/L — ABNORMAL LOW (ref 3.5–5.1)
Sodium: 138 mmol/L (ref 135–145)
Total Bilirubin: 0.9 mg/dL (ref 0.3–1.2)
Total Protein: 7.8 g/dL (ref 6.5–8.1)

## 2020-04-23 MED ORDER — ONDANSETRON 4 MG PO TBDP: 4.0000 mg | ORAL_TABLET | Freq: Three times a day (TID) | ORAL | 0 refills | Status: DC | PRN

## 2020-04-23 MED ORDER — ONDANSETRON HCL 4 MG/2ML IJ SOLN
4.0000 mg | Freq: Once | INTRAMUSCULAR | Status: AC
  Administered 2020-04-23: 4 mg via INTRAVENOUS
  Filled 2020-04-23: qty 2

## 2020-04-23 MED ORDER — LACTATED RINGERS IV BOLUS
1000.0000 mL | Freq: Once | INTRAVENOUS | Status: AC
  Administered 2020-04-23: 1000 mL via INTRAVENOUS

## 2020-04-23 NOTE — ED Triage Notes (Signed)
PT to ED via EMS from home. PT c/o nausea, vomiting and diarrhea since 2am. PT also states bilateral arm weakness and numbness.

## 2020-04-23 NOTE — ED Provider Notes (Signed)
[facility] Emergency Department Provider Note  ____________________________________________  Time seen: Approximately 5:27 AM  I have reviewed the triage vital signs and the nursing notes.   HISTORY  Chief Complaint Emesis and Diarrhea   HPI Karen Potts is a 59 y.o. female with the history of diabetes, hypertension, thrombocytopenia, CAD status post stent who presents for evaluation of vomiting and diarrhea.  Patient reports that her symptoms started 2 AM.  She had 2 episodes of nonbloody nonbilious emesis and 3 episodes of watery diarrhea.  Patient reports that she started to feel very weak and dizzy and she was afraid she was going to pass out if she stayed at home.  No abdominal pain, no chest pain, no shortness of breath, no fever, no cough.  Patient reports being tested for Covid a few weeks ago after being exposed to it.  She is fully vaccinated against it.  She denies any recent antibiotics use or history of C. difficile.   Past Medical History:  Diagnosis Date  . Diabetes mellitus without complication (HCC)   . Hypertension   . MI, old 2019  . Sleep apnea   . Vertigo     Patient Active Problem List   Diagnosis Date Noted  . Thrombocytopenia (HCC) 04/08/2019  . HTN (hypertension) 07/05/2018  . Tobacco use 07/05/2018  . S/P drug eluting coronary stent placement 03/15/2018  . Unstable angina (HCC) 02/24/2018  . NSTEMI (non-ST elevated myocardial infarction) (HCC) 02/24/2018  . Full thickness rotator cuff tear 07/31/2017    Past Surgical History:  Procedure Laterality Date  . ABDOMINAL HYSTERECTOMY    . BREAST BIOPSY Left 10/2017    APOCRINE TYPE CYST WITH FIBROSIS Of the Wall  . CORONARY STENT INTERVENTION N/A 02/24/2018   Procedure: CORONARY STENT INTERVENTION;  Surgeon: Marcina Millard, MD;  Location: [facility] INVASIVE CV LAB;  Service: Cardiovascular;  Laterality: N/A;  . LEFT HEART CATH AND CORONARY ANGIOGRAPHY Right 02/24/2018    Procedure: LEFT HEART CATH AND CORONARY ANGIOGRAPHY;  Surgeon: Marcina Millard, MD;  Location: [facility] INVASIVE CV LAB;  Service: Cardiovascular;  Laterality: Right;  . SHOULDER ARTHROSCOPY WITH OPEN ROTATOR CUFF REPAIR Left 08/14/2017   Procedure: SHOULDER ARTHROSCOPY WITH ROTATOR CUFF REPAIR, BICEPS TENOTOMY, SUBACROMIAL DECOMPRESSION;  Surgeon: Lyndle Herrlich, MD;  Location: [facility] ORS;  Service: Orthopedics;  Laterality: Left;  . UMBILICAL HERNIA REPAIR      Prior to Admission medications   Medication Sig Start Date End Date Taking? Authorizing Provider  aspirin EC 81 MG tablet Take 81 mg by mouth daily.    [provider]  atorvastatin (LIPITOR) 80 MG tablet Take 1 tablet (80 mg total) by mouth daily at 6 PM. 02/26/18 04/07/28  Pyreddy, Vivien Rota, MD  diclofenac sodium (VOLTAREN) 1 % GEL Apply 2 g topically 4 (four) times daily as needed (pain in knee).    [provider]  lisinopril-hydrochlorothiazide (PRINZIDE,ZESTORETIC) 10-12.5 MG tablet TAKE 1 TABLET BY MOUTH ONCE DAILY 05/01/17   Chrismon, Jodell Cipro, PA  loratadine (CLARITIN) 10 MG tablet Take by mouth. 06/10/18   [provider]  meclizine (ANTIVERT) 12.5 MG tablet Take 1 tablet (12.5 mg total) by mouth 3 (three) times daily as needed for dizziness or nausea. 12/13/17   Sharyn Creamer, MD  meclizine (ANTIVERT) 25 MG tablet Take 1 tablet (25 mg total) by mouth 3 (three) times daily as needed for dizziness. 04/10/18   Phineas Semen, MD  metFORMIN (GLUCOPHAGE) 500 MG tablet Take 500 mg by mouth 2 (  two) times daily.     [provider]  nitroGLYCERIN (NITROSTAT) 0.4 MG SL tablet Place 1 tablet (0.4 mg total) under the tongue every 5 (five) minutes as needed for chest pain. Patient not taking: Reported on 08/18/2019 02/26/18   Ihor Austin, MD  ondansetron (ZOFRAN ODT) 4 MG disintegrating tablet Take 1 tablet (4 mg total) by mouth every 8 (eight) hours as needed. 04/23/20   Nita Sickle, MD     Allergies Patient has no known allergies.  Family History  Problem Relation Age of Onset  . Breast cancer Neg Hx     Social History Social History   Tobacco Use  . Smoking status: Current Some Day Smoker    Types: Cigarettes    Last attempt to quit: 02/24/2018    Years since quitting: 2.1  . Smokeless tobacco: Never Used  . Tobacco comment: Kendalynn said she quit smoking the day of her heart attack.  Vaping Use  . Vaping Use: Never used  Substance Use Topics  . Alcohol use: No  . Drug use: No    Review of Systems  Constitutional: Negative for fever. + lightheadedness Eyes: Negative for visual changes. ENT: Negative for sore throat. Neck: No neck pain  Cardiovascular: Negative for chest pain. Respiratory: Negative for shortness of breath. Gastrointestinal: Negative for abdominal pain. + vomiting and diarrhea. Genitourinary: Negative for dysuria. Musculoskeletal: Negative for back pain. Skin: Negative for rash. Neurological: Negative for headaches, weakness or numbness. Psych: No SI or HI  ____________________________________________   PHYSICAL EXAM:  VITAL SIGNS: ED Triage Vitals  Enc Vitals Group     BP 04/23/20 0440 (!) 158/89     Pulse Rate 04/23/20 0440 (!) 54     Resp 04/23/20 0440 (!) 21     Temp 04/23/20 0441 97.7 F (36.5 C)     Temp src --      SpO2 04/23/20 0440 99 %     Weight 04/23/20 0437 274 lb (124.3 kg)     Height 04/23/20 0437 5\' 7"  (1.702 m)     Head Circumference --      Peak Flow --      Pain Score 04/23/20 0436 0     Pain Loc --      Pain Edu? --      Excl. in GC? --     Constitutional: Alert and oriented. Well appearing and in no apparent distress. HEENT:      Head: Normocephalic and atraumatic.         Eyes: Conjunctivae are normal. Sclera is non-icteric.       Mouth/Throat: Mucous membranes are moist.       Neck: Supple with no signs of meningismus. Cardiovascular: Bradycardic with regular rhythm Respiratory: Normal  respiratory effort. Lungs are clear to auscultation bilaterally.  Gastrointestinal: Soft, non tender, and non distended with positive bowel sounds. No rebound or guarding. Musculoskeletal: No edema, cyanosis, or erythema of extremities. Neurologic: Normal speech and language. Face is symmetric. Moving all extremities. No gross focal neurologic deficits are appreciated. Skin: Skin is warm, dry and intact. No rash noted. Psychiatric: Mood and affect are normal. Speech and behavior are normal.  ____________________________________________   LABS (all labs ordered are listed, but only abnormal results are displayed)  Labs Reviewed  COMPREHENSIVE METABOLIC PANEL - Abnormal; Notable for the following components:      Result Value   Potassium 3.4 (*)    Glucose, Bld 152 (*)    All other components within normal  limits  CBC WITH DIFFERENTIAL/PLATELET - Abnormal; Notable for the following components:   WBC 11.6 (*)    Platelets 121 (*)    Lymphs Abs 4.2 (*)    All other components within normal limits  CBC WITH DIFFERENTIAL/PLATELET   ____________________________________________  EKG  ED ECG REPORT I, Nita Sickle, the attending physician, personally viewed and interpreted this ECG.  Sinus bradycardia with rate of 56, normal intervals, normal axis, no ST elevations or depressions.  Unchanged from prior from October 2019 ____________________________________________  RADIOLOGY  none  ____________________________________________   PROCEDURES  Procedure(s) performed:yes .1-3 Lead EKG Interpretation Performed by: Nita Sickle, MD Authorized by: Nita Sickle, MD     Interpretation: non-specific     ECG rate assessment: bradycardic     Rhythm: sinus bradycardia     Critical Care performed:  None ____________________________________________   INITIAL IMPRESSION / ASSESSMENT AND PLAN / ED COURSE  59 y.o. female with the history of diabetes, hypertension,  thrombocytopenia, CAD status post stent who presents for evaluation of vomiting and diarrhea.  Patient is extremely well-appearing in no distress with normal vital signs, abdomen is soft with no distention, positive bowel sounds and no tenderness throughout.  Differential diagnoses including viral gastroenteritis versus colitis versus diverticulitis versus gastritis.  Labs showing no significant electrolyte derangements, normal LFTs, mildly elevated glucose with no evidence of DKA. CBC pending. Will give IVF and IV zofran.  Patient placed on telemetry for close monitoring.  Old medical records reviewed.  _________________________ 6:52 AM on 04/23/2020 -----------------------------------------  Labs showing stable mild thrombocytopenia.  Mild leukocytosis with increased lymphocytes consistent with a viral infectious etiology.  After 1L bolus patient feels markedly improved, she is tolerating p.o.  No episodes of vomiting or diarrhea in the emergency room.  Will discharge home on Zofran, bland diet, follow-up with PCP.  Recommended return to the emergency room for abdominal pain, fever, respiratory symptoms.     _____________________________________________ Please note:  Patient was evaluated in Emergency Department today for the symptoms described in the history of present illness. Patient was evaluated in the context of the global COVID-19 pandemic, which necessitated consideration that the patient might be at risk for infection with the SARS-CoV-2 virus that causes COVID-19. Institutional protocols and algorithms that pertain to the evaluation of patients at risk for COVID-19 are in a state of rapid change based on information released by regulatory bodies including the CDC and federal and state organizations. These policies and algorithms were followed during the patient's care in the ED.  Some ED evaluations and interventions may be delayed as a result of limited staffing during the pandemic.   [place]  Controlled Substance Database was reviewed by me. ____________________________________________   FINAL CLINICAL IMPRESSION(S) / ED DIAGNOSES   Final diagnoses:  Nausea vomiting and diarrhea  Gastroenteritis      NEW MEDICATIONS STARTED DURING THIS VISIT:  ED Discharge Orders         Ordered    ondansetron (ZOFRAN ODT) 4 MG disintegrating tablet  Every 8 hours PRN        04/23/20 1696           Note:  This document was prepared using Dragon voice recognition software and may include unintentional dictation errors.    Don Perking, [place], MD 04/23/20 [id number]

## 2020-04-30 ENCOUNTER — Other Ambulatory Visit: Payer: Self-pay | Admitting: Neurology

## 2020-04-30 DIAGNOSIS — M5416 Radiculopathy, lumbar region: Secondary | ICD-10-CM

## 2020-05-14 ENCOUNTER — Other Ambulatory Visit: Payer: Self-pay

## 2020-05-14 ENCOUNTER — Ambulatory Visit
Admission: RE | Admit: 2020-05-14 | Discharge: 2020-05-14 | Disposition: A | Discharge status: Home / Self Care | Payer: Medicaid Other | Source: Ambulatory Visit | Attending: Neurology | Admitting: Neurology

## 2020-05-14 DIAGNOSIS — M5416 Radiculopathy, lumbar region: Secondary | ICD-10-CM | POA: Diagnosis not present

## 2020-06-30 DIAGNOSIS — M5136 Other intervertebral disc degeneration, lumbar region: Secondary | ICD-10-CM

## 2020-06-30 DIAGNOSIS — M51369 Other intervertebral disc degeneration, lumbar region without mention of lumbar back pain or lower extremity pain: Secondary | ICD-10-CM

## 2020-06-30 HISTORY — DX: Other intervertebral disc degeneration, lumbar region without mention of lumbar back pain or lower extremity pain: M51.369

## 2020-06-30 HISTORY — DX: Other intervertebral disc degeneration, lumbar region: M51.36

## 2020-08-07 DIAGNOSIS — H9311 Tinnitus, right ear: Secondary | ICD-10-CM | POA: Insufficient documentation

## 2020-08-07 DIAGNOSIS — R42 Dizziness and giddiness: Secondary | ICD-10-CM | POA: Insufficient documentation

## 2020-08-20 ENCOUNTER — Inpatient Hospital Stay: Payer: Medicare Other | Attending: Internal Medicine

## 2020-08-20 ENCOUNTER — Other Ambulatory Visit: Payer: Self-pay

## 2020-08-20 ENCOUNTER — Inpatient Hospital Stay (HOSPITAL_BASED_OUTPATIENT_CLINIC_OR_DEPARTMENT_OTHER): Payer: Medicare Other | Admitting: Internal Medicine

## 2020-08-20 VITALS — BP 143/63 | HR 62 | Temp 96.6°F | Resp 18 | Ht 67.0 in | Wt 270.0 lb

## 2020-08-20 DIAGNOSIS — G4730 Sleep apnea, unspecified: Secondary | ICD-10-CM | POA: Insufficient documentation

## 2020-08-20 DIAGNOSIS — E119 Type 2 diabetes mellitus without complications: Secondary | ICD-10-CM | POA: Diagnosis not present

## 2020-08-20 DIAGNOSIS — K529 Noninfective gastroenteritis and colitis, unspecified: Secondary | ICD-10-CM | POA: Diagnosis not present

## 2020-08-20 DIAGNOSIS — Z7984 Long term (current) use of oral hypoglycemic drugs: Secondary | ICD-10-CM | POA: Insufficient documentation

## 2020-08-20 DIAGNOSIS — Z7982 Long term (current) use of aspirin: Secondary | ICD-10-CM | POA: Insufficient documentation

## 2020-08-20 DIAGNOSIS — I252 Old myocardial infarction: Secondary | ICD-10-CM | POA: Insufficient documentation

## 2020-08-20 DIAGNOSIS — Z79899 Other long term (current) drug therapy: Secondary | ICD-10-CM | POA: Insufficient documentation

## 2020-08-20 DIAGNOSIS — D696 Thrombocytopenia, unspecified: Secondary | ICD-10-CM

## 2020-08-20 DIAGNOSIS — M25569 Pain in unspecified knee: Secondary | ICD-10-CM | POA: Insufficient documentation

## 2020-08-20 DIAGNOSIS — Z90710 Acquired absence of both cervix and uterus: Secondary | ICD-10-CM | POA: Diagnosis not present

## 2020-08-20 DIAGNOSIS — I2510 Atherosclerotic heart disease of native coronary artery without angina pectoris: Secondary | ICD-10-CM | POA: Insufficient documentation

## 2020-08-20 DIAGNOSIS — I10 Essential (primary) hypertension: Secondary | ICD-10-CM | POA: Insufficient documentation

## 2020-08-20 DIAGNOSIS — F17210 Nicotine dependence, cigarettes, uncomplicated: Secondary | ICD-10-CM | POA: Insufficient documentation

## 2020-08-20 LAB — CBC WITH DIFFERENTIAL/PLATELET
Abs Immature Granulocytes: 0.02 10*3/uL (ref 0.00–0.07)
Basophils Absolute: 0 10*3/uL (ref 0.0–0.1)
Basophils Relative: 0 %
Eosinophils Absolute: 0.3 10*3/uL (ref 0.0–0.5)
Eosinophils Relative: 4 %
HCT: 42.3 % (ref 36.0–46.0)
Hemoglobin: 13.8 g/dL (ref 12.0–15.0)
Immature Granulocytes: 0 %
Lymphocytes Relative: 51 %
Lymphs Abs: 4.7 10*3/uL — ABNORMAL HIGH (ref 0.7–4.0)
MCH: 27.9 pg (ref 26.0–34.0)
MCHC: 32.6 g/dL (ref 30.0–36.0)
MCV: 85.5 fL (ref 80.0–100.0)
Monocytes Absolute: 0.5 10*3/uL (ref 0.1–1.0)
Monocytes Relative: 5 %
Neutro Abs: 3.8 10*3/uL (ref 1.7–7.7)
Neutrophils Relative %: 40 %
Platelets: 114 10*3/uL — ABNORMAL LOW (ref 150–400)
RBC: 4.95 MIL/uL (ref 3.87–5.11)
RDW: 14.6 % (ref 11.5–15.5)
Smear Review: DECREASED
WBC: 9.4 10*3/uL (ref 4.0–10.5)
nRBC: 0 % (ref 0.0–0.2)

## 2020-08-20 LAB — COMPREHENSIVE METABOLIC PANEL
ALT: 24 U/L (ref 0–44)
AST: 24 U/L (ref 15–41)
Albumin: 3.9 g/dL (ref 3.5–5.0)
Alkaline Phosphatase: 64 U/L (ref 38–126)
Anion gap: 9 (ref 5–15)
BUN: 12 mg/dL (ref 6–20)
CO2: 28 mmol/L (ref 22–32)
Calcium: 9.1 mg/dL (ref 8.9–10.3)
Chloride: 102 mmol/L (ref 98–111)
Creatinine, Ser: 0.73 mg/dL (ref 0.44–1.00)
GFR, Estimated: >60 mL/min (ref >60)
Glucose, Bld: 118 mg/dL — ABNORMAL HIGH (ref 70–99)
Potassium: 3.8 mmol/L (ref 3.5–5.1)
Sodium: 139 mmol/L (ref 135–145)
Total Bilirubin: 0.7 mg/dL (ref 0.3–1.2)
Total Protein: 7.6 g/dL (ref 6.5–8.1)

## 2020-08-20 LAB — LACTATE DEHYDROGENASE: LDH: 107 U/L (ref 98–192)

## 2020-08-20 NOTE — Progress Notes (Signed)
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  Patient denies any gum bleeding.  Denies any nosebleeds. Patient continues to complain of pain in her knee; awaiting surgery.   Patient complains of chronic diarrhea.  Multiple loose stools.  Intermittent abdominal discomfort.  Denies any previous colonoscopies.  Review of Systems  Constitutional: Positive for malaise/fatigue. Negative for chills, diaphoresis, fever and weight loss.  HENT: Negative for nosebleeds and sore throat.   Eyes: Negative for double vision.  Respiratory: Positive for shortness of breath. Negative for cough, hemoptysis, sputum production and wheezing.   Cardiovascular: Negative for chest pain, palpitations, orthopnea and leg swelling.  Gastrointestinal: Positive for diarrhea. Negative for abdominal pain, blood in stool, constipation, heartburn, melena, nausea and vomiting.  Genitourinary: Negative for dysuria, frequency and urgency.  Musculoskeletal: Positive for back pain and joint pain.  Skin: Negative.  Negative for itching and rash.  Neurological: Negative for dizziness, tingling, focal weakness, weakness and headaches.  Endo/Heme/Allergies: Does not bruise/bleed easily.  Psychiatric/Behavioral: Negative for depression. The patient is not nervous/anxious and does not have insomnia.      MEDICAL HISTORY:  Past  Medical History:  Diagnosis Date  . Diabetes mellitus without complication (HCC)   . Hypertension   . MI, old 2019  . Sleep apnea   . Vertigo     SURGICAL HISTORY: Past Surgical History:  Procedure Laterality Date  . ABDOMINAL HYSTERECTOMY    . BREAST BIOPSY Left 10/2017    APOCRINE TYPE CYST WITH FIBROSIS Of the Wall  . CORONARY STENT INTERVENTION N/A 02/24/2018   Procedure: CORONARY STENT INTERVENTION;  Surgeon: Marcina Millard, MD;  Location: [facility];  Service: Cardiovascular;  Laterality: N/A;  . LEFT HEART CATH AND CORONARY ANGIOGRAPHY Right 02/24/2018   Procedure: LEFT HEART CATH AND CORONARY ANGIOGRAPHY;  Surgeon: Marcina Millard, MD;  Location: [facility];  Service: Cardiovascular;  Laterality: Right;  . SHOULDER ARTHROSCOPY WITH OPEN ROTATOR CUFF REPAIR Left 08/14/2017   Procedure: SHOULDER ARTHROSCOPY WITH ROTATOR CUFF REPAIR, BICEPS TENOTOMY, SUBACROMIAL DECOMPRESSION;  Surgeon: Lyndle Herrlich, MD;  Location: [facility] ORS;  Service: Orthopedics;  Laterality: Left;  . UMBILICAL HERNIA REPAIR      SOCIAL HISTORY: Social History   Socioeconomic History  . Marital status: Married    Spouse name: Not on file  . Number of children: Not on file  . Years of education: Not on file  . Highest education level: Not on file  Occupational History  . Not on file  Tobacco Use  . Smoking status: Current Some Day Smoker    Types: Cigarettes    Last attempt to quit: 02/24/2018    Years since quitting: 2.4  . Smokeless tobacco: Never Used  . Tobacco comment: Lavern said she quit smoking the day of her heart attack.  Vaping Use  . Vaping Use: Never used  Substance and Sexual Activity  . Alcohol use: No  . Drug use: No  . Sexual activity: Yes  Birth control/protection: Condom  Other Topics Concern  . Not on file  Social History Narrative   Not working/disability; smoking; no alcohol. In [place]/ lives with a friend.    Social Determinants of  Health   Financial Resource Strain: Not on file  Food Insecurity: Not on file  Transportation Needs: Not on file  Physical Activity: Not on file  Stress: Not on file  Social Connections: Not on file  Intimate Partner Violence: Not on file    FAMILY HISTORY: Family History  Problem Relation Age of Onset  . Breast cancer Neg Hx     ALLERGIES:  has No Known Allergies.  MEDICATIONS:  Current Outpatient Medications  Medication Sig Dispense Refill  . aspirin EC 81 MG tablet Take 81 mg by mouth daily.    Marland Kitchen atorvastatin (LIPITOR) 80 MG tablet Take 1 tablet (80 mg total) by mouth daily at 6 PM. 30 tablet 0  . lisinopril-hydrochlorothiazide (PRINZIDE,ZESTORETIC) 10-12.5 MG tablet TAKE 1 TABLET BY MOUTH ONCE DAILY 90 tablet 1  . loratadine (CLARITIN) 10 MG tablet Take by mouth.    . meclizine (ANTIVERT) 12.5 MG tablet Take 1 tablet (12.5 mg total) by mouth 3 (three) times daily as needed for dizziness or nausea. 30 tablet 1  . meclizine (ANTIVERT) 25 MG tablet Take 1 tablet (25 mg total) by mouth 3 (three) times daily as needed for dizziness. 30 tablet 0  . metFORMIN (GLUCOPHAGE) 500 MG tablet Take 500 mg by mouth 2 (two) times daily.     . ondansetron (ZOFRAN ODT) 4 MG disintegrating tablet Take 1 tablet (4 mg total) by mouth every 8 (eight) hours as needed. 20 tablet 0  . nitroGLYCERIN (NITROSTAT) 0.4 MG SL tablet Place 1 tablet (0.4 mg total) under the tongue every 5 (five) minutes as needed for chest pain. (Patient not taking: Reported on 08/20/2020) 30 tablet 12  . tiZANidine (ZANAFLEX) 4 MG tablet tizanidine 4 mg tablet  Take 1 tablet every day by oral route at bedtime.     No current facility-administered medications for this visit.     Marland Kitchen  PHYSICAL EXAMINATION:   Vitals:   08/20/20 1019  BP: (!) 143/63  Pulse: 62  Resp: 18  Temp: (!) 96.6 F (35.9 C)  SpO2: 99%   Filed Weights   08/20/20 1019  Weight: 270 lb (122.5 kg)    Physical Exam HENT:     Head:  Normocephalic and atraumatic.     Mouth/Throat:     Pharynx: No oropharyngeal exudate.  Eyes:     Pupils: Pupils are equal, round, and reactive to light.  Cardiovascular:     Rate and Rhythm: Normal rate and regular rhythm.  Pulmonary:     Effort: No respiratory distress.     Breath sounds: No wheezing.  Abdominal:     General: Bowel sounds are normal. There is no distension.     Palpations: Abdomen is soft. There is no mass.     Tenderness: There is no abdominal tenderness. There is no guarding or rebound.  Musculoskeletal:        General: No tenderness. Normal range of motion.     Cervical back: Normal range of motion and neck supple.  Skin:    General: Skin is warm.  Neurological:     Mental Status: She is alert and oriented to person, place, and time.  Psychiatric:        Mood and Affect: Affect normal.      LABORATORY DATA:  I  have reviewed the data as listed Lab Results  Component Value Date   WBC 9.4 08/20/2020   HGB 13.8 08/20/2020   HCT 42.3 08/20/2020   MCV 85.5 08/20/2020   PLT 114 (L) 08/20/2020   Recent Labs    02/17/20 1011 04/23/20 0442 08/20/20 0958  NA 139 138 139  K 3.2* 3.4* 3.8  CL 104 103 102  CO2 27 23 28   GLUCOSE 121* 152* 118*  BUN 9 17 12   CREATININE 0.72 0.83 0.73  CALCIUM 8.9 9.4 9.1  GFRNONAA >60 >60 >60  GFRAA >60  --   --   PROT 7.9 7.8 7.6  ALBUMIN 4.0 4.2 3.9  AST 40 37 24  ALT 33 30 24  ALKPHOS 57 61 64  BILITOT 0.9 0.9 0.7     No results found.  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  Platelets today are 114 STABLE; Asymptomatic.  Would not recommend treatment unless less than 50 or symptomatic./See below  #Possible knee surgery [s/p cortisone shot; Dr.Bowers]; recommend in follow-up as if she needed hematology clearance.  # CAD/MI- [Aug 2019]- asprin; Effient ] [Dr.Parschoes/] stable  # Chronic diarrhea-question etiology.  Patient  will also need screening colonoscopy.defer to PCP regarding evaluation with GI.  #Smoker : 2 cig/day- not a candidate for LCSP.  Counseled to quit smoking.  # DISPOSITION: # follow up in 6 month-MD: labs- cbc/cmp;LDH/b12 leevls- Dr.B    , MD 08/20/2020 12:50 PM

## 2020-08-20 NOTE — Assessment & Plan Note (Addendum)
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  Platelets today are 114 STABLE; Asymptomatic.  Would not recommend treatment unless less than 50 or symptomatic./See below  #Possible knee surgery [s/p cortisone shot; Dr.Bowers]; recommend in follow-up as if she needed hematology clearance.  # CAD/MI- [Aug 2019]- asprin; Effient Jocundus.Bianchi ] [Dr.Parschoes/] stable  # Chronic diarrhea-question etiology.  Patient will also need screening colonoscopy.defer to PCP regarding evaluation with GI.  #Smoker : 2 cig/day- not a candidate for LCSP.  Counseled to quit smoking.  # DISPOSITION: # follow up in 6 month-MD: labs- cbc/cmp;LDH/b12 leevls- Dr.B

## 2021-02-18 ENCOUNTER — Inpatient Hospital Stay: Payer: Medicare Other | Attending: Internal Medicine

## 2021-02-18 ENCOUNTER — Inpatient Hospital Stay: Payer: Medicare Other | Admitting: Internal Medicine

## 2021-02-18 DIAGNOSIS — Z79899 Other long term (current) drug therapy: Secondary | ICD-10-CM | POA: Insufficient documentation

## 2021-02-18 DIAGNOSIS — Z7982 Long term (current) use of aspirin: Secondary | ICD-10-CM | POA: Insufficient documentation

## 2021-02-18 DIAGNOSIS — I10 Essential (primary) hypertension: Secondary | ICD-10-CM | POA: Insufficient documentation

## 2021-02-18 DIAGNOSIS — D696 Thrombocytopenia, unspecified: Secondary | ICD-10-CM | POA: Insufficient documentation

## 2021-02-18 DIAGNOSIS — G4730 Sleep apnea, unspecified: Secondary | ICD-10-CM | POA: Insufficient documentation

## 2021-02-18 DIAGNOSIS — E119 Type 2 diabetes mellitus without complications: Secondary | ICD-10-CM | POA: Insufficient documentation

## 2021-02-18 DIAGNOSIS — I252 Old myocardial infarction: Secondary | ICD-10-CM | POA: Insufficient documentation

## 2021-02-18 DIAGNOSIS — F17210 Nicotine dependence, cigarettes, uncomplicated: Secondary | ICD-10-CM | POA: Insufficient documentation

## 2021-02-18 DIAGNOSIS — I2510 Atherosclerotic heart disease of native coronary artery without angina pectoris: Secondary | ICD-10-CM | POA: Insufficient documentation

## 2021-02-18 DIAGNOSIS — Z7984 Long term (current) use of oral hypoglycemic drugs: Secondary | ICD-10-CM | POA: Insufficient documentation

## 2021-02-18 DIAGNOSIS — Z90710 Acquired absence of both cervix and uterus: Secondary | ICD-10-CM | POA: Insufficient documentation

## 2021-02-25 ENCOUNTER — Inpatient Hospital Stay: Payer: Medicare Other

## 2021-02-25 ENCOUNTER — Inpatient Hospital Stay (HOSPITAL_BASED_OUTPATIENT_CLINIC_OR_DEPARTMENT_OTHER): Payer: Medicare Other | Admitting: Internal Medicine

## 2021-02-25 ENCOUNTER — Encounter: Payer: Self-pay | Admitting: Internal Medicine

## 2021-02-25 ENCOUNTER — Other Ambulatory Visit: Payer: Self-pay

## 2021-02-25 DIAGNOSIS — Z7982 Long term (current) use of aspirin: Secondary | ICD-10-CM | POA: Diagnosis not present

## 2021-02-25 DIAGNOSIS — D696 Thrombocytopenia, unspecified: Secondary | ICD-10-CM | POA: Diagnosis present

## 2021-02-25 DIAGNOSIS — Z7984 Long term (current) use of oral hypoglycemic drugs: Secondary | ICD-10-CM | POA: Diagnosis not present

## 2021-02-25 DIAGNOSIS — Z90710 Acquired absence of both cervix and uterus: Secondary | ICD-10-CM | POA: Diagnosis not present

## 2021-02-25 DIAGNOSIS — I252 Old myocardial infarction: Secondary | ICD-10-CM | POA: Diagnosis not present

## 2021-02-25 DIAGNOSIS — I10 Essential (primary) hypertension: Secondary | ICD-10-CM | POA: Diagnosis not present

## 2021-02-25 DIAGNOSIS — I2510 Atherosclerotic heart disease of native coronary artery without angina pectoris: Secondary | ICD-10-CM | POA: Diagnosis not present

## 2021-02-25 DIAGNOSIS — F17210 Nicotine dependence, cigarettes, uncomplicated: Secondary | ICD-10-CM | POA: Diagnosis not present

## 2021-02-25 DIAGNOSIS — Z79899 Other long term (current) drug therapy: Secondary | ICD-10-CM | POA: Diagnosis not present

## 2021-02-25 DIAGNOSIS — E119 Type 2 diabetes mellitus without complications: Secondary | ICD-10-CM | POA: Diagnosis not present

## 2021-02-25 DIAGNOSIS — G4730 Sleep apnea, unspecified: Secondary | ICD-10-CM | POA: Diagnosis not present

## 2021-02-25 LAB — CBC WITH DIFFERENTIAL/PLATELET
Abs Immature Granulocytes: 0.02 10*3/uL (ref 0.00–0.07)
Basophils Absolute: 0 10*3/uL (ref 0.0–0.1)
Basophils Relative: 0 %
Eosinophils Absolute: 0.3 10*3/uL (ref 0.0–0.5)
Eosinophils Relative: 4 %
HCT: 42.7 % (ref 36.0–46.0)
Hemoglobin: 13.7 g/dL (ref 12.0–15.0)
Immature Granulocytes: 0 %
Lymphocytes Relative: 48 %
Lymphs Abs: 4.3 10*3/uL — ABNORMAL HIGH (ref 0.7–4.0)
MCH: 28 pg (ref 26.0–34.0)
MCHC: 32.1 g/dL (ref 30.0–36.0)
MCV: 87.1 fL (ref 80.0–100.0)
Monocytes Absolute: 0.5 10*3/uL (ref 0.1–1.0)
Monocytes Relative: 6 %
Neutro Abs: 3.8 10*3/uL (ref 1.7–7.7)
Neutrophils Relative %: 42 %
Platelets: 117 10*3/uL — ABNORMAL LOW (ref 150–400)
RBC: 4.9 MIL/uL (ref 3.87–5.11)
RDW: 14.9 % (ref 11.5–15.5)
Smear Review: NORMAL
WBC: 9.1 10*3/uL (ref 4.0–10.5)
nRBC: 0 % (ref 0.0–0.2)

## 2021-02-25 LAB — COMPREHENSIVE METABOLIC PANEL
ALT: 20 U/L (ref 0–44)
AST: 23 U/L (ref 15–41)
Albumin: 3.9 g/dL (ref 3.5–5.0)
Alkaline Phosphatase: 63 U/L (ref 38–126)
Anion gap: 6 (ref 5–15)
BUN: 13 mg/dL (ref 8–23)
CO2: 30 mmol/L (ref 22–32)
Calcium: 9.1 mg/dL (ref 8.9–10.3)
Chloride: 103 mmol/L (ref 98–111)
Creatinine, Ser: 0.9 mg/dL (ref 0.44–1.00)
GFR, Estimated: >60 mL/min (ref >60)
Glucose, Bld: 146 mg/dL — ABNORMAL HIGH (ref 70–99)
Potassium: 3.3 mmol/L — ABNORMAL LOW (ref 3.5–5.1)
Sodium: 139 mmol/L (ref 135–145)
Total Bilirubin: 0.9 mg/dL (ref 0.3–1.2)
Total Protein: 7.5 g/dL (ref 6.5–8.1)

## 2021-02-25 LAB — VITAMIN B12: Vitamin B-12: 251 pg/mL (ref 180–914)

## 2021-02-25 LAB — LACTATE DEHYDROGENASE: LDH: 107 U/L (ref 98–192)

## 2021-02-25 NOTE — Progress Notes (Signed)
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 Karen Potts 60 y.o.  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  No bleeding.  Patient is trying to lose weight to avoid knee surgery.  Review of Systems  Constitutional:  Positive for malaise/fatigue. Negative for chills, diaphoresis, fever and weight loss.  HENT:  Negative for nosebleeds and sore throat.   Eyes:  Negative for double vision.  Respiratory:  Positive for shortness of breath. Negative for cough, hemoptysis, sputum production and wheezing.   Cardiovascular:  Negative for chest pain, palpitations, orthopnea and leg swelling.  Gastrointestinal:  Positive for diarrhea. Negative for abdominal pain, blood in stool, constipation, heartburn, melena, nausea and vomiting.  Genitourinary:  Negative for dysuria, frequency and urgency.  Musculoskeletal:  Positive for back pain and joint pain.  Skin: Negative.  Negative for itching and rash.  Neurological:  Negative for dizziness, tingling, focal weakness, weakness and headaches.  Endo/Heme/Allergies:  Does not bruise/bleed easily.  Psychiatric/Behavioral:  Negative for depression. The patient is not nervous/anxious and does not have insomnia.     MEDICAL HISTORY:  Past Medical History:  Diagnosis Date   Diabetes mellitus without complication (HCC)    Hypertension    MI, old 2019   Sleep apnea    Vertigo     SURGICAL  HISTORY: Past Surgical History:  Procedure Laterality Date   ABDOMINAL HYSTERECTOMY     BREAST BIOPSY Left 10/2017    APOCRINE TYPE CYST WITH FIBROSIS Of the Wall   CORONARY STENT INTERVENTION N/A 02/24/2018   Procedure: CORONARY STENT INTERVENTION;  Surgeon: Marcina Millard, MD;  Location: [facility];  Service: Cardiovascular;  Laterality: N/A;   LEFT HEART CATH AND CORONARY ANGIOGRAPHY Right 02/24/2018   Procedure: LEFT HEART CATH AND CORONARY ANGIOGRAPHY;  Surgeon: Marcina Millard, MD;  Location: [facility];  Service: Cardiovascular;  Laterality: Right;   SHOULDER ARTHROSCOPY WITH OPEN ROTATOR CUFF REPAIR Left 08/14/2017   Procedure: SHOULDER ARTHROSCOPY WITH ROTATOR CUFF REPAIR, BICEPS TENOTOMY, SUBACROMIAL DECOMPRESSION;  Surgeon: Lyndle Herrlich, MD;  Location: [facility];  Service: Orthopedics;  Laterality: Left;   UMBILICAL HERNIA REPAIR      SOCIAL HISTORY: Social History   Socioeconomic History   Marital status: Married    Spouse name: Not on file   Number of children: Not on file   Years of education: Not on file   Highest education level: Not on file  Occupational History   Not on file  Tobacco Use   Smoking status: Some Days    Types: Cigarettes    Last attempt to quit: 02/24/2018    Years since quitting: 3.0   Smokeless tobacco: Never   Tobacco comments:    Shirrell said she quit smoking the day of her heart attack.  Vaping Use   Vaping Use: Never used  Substance and Sexual Activity   Alcohol use: No   Drug use: No   Sexual activity: Yes    Birth control/protection: Condom  Other Topics Concern   Not on file  Social History Narrative   Not working/disability; smoking; no alcohol. In [place]/ lives with a friend.    Social Determinants of Health   Financial Resource Strain: Not on file  Food Insecurity: Not on file  Transportation Needs: Not on file  Physical Activity: Not on file  Stress: Not on file  Social Connections: Not  on file  Intimate Partner Violence: Not on file    FAMILY HISTORY: Family History  Problem Relation Age of Onset   Breast cancer Neg Hx     ALLERGIES:  has No Known Allergies.  MEDICATIONS:  Current Outpatient Medications  Medication Sig Dispense Refill   aspirin EC 81 MG tablet Take 81 mg by mouth daily.     atorvastatin (LIPITOR) 80 MG tablet Take 1 tablet (80 mg total) by mouth daily at 6 PM. 30 tablet 0   lisinopril-hydrochlorothiazide (PRINZIDE,ZESTORETIC) 10-12.5 MG tablet TAKE 1 TABLET BY MOUTH ONCE DAILY 90 tablet 1   metFORMIN (GLUCOPHAGE) 500 MG tablet Take 500 mg by mouth daily with breakfast.     loratadine (CLARITIN) 10 MG tablet Take by mouth. (Patient not taking: Reported on 02/25/2021)     meclizine (ANTIVERT) 12.5 MG tablet Take 1 tablet (12.5 mg total) by mouth 3 (three) times daily as needed for dizziness or nausea. (Patient not taking: Reported on 02/25/2021) 30 tablet 1   nitroGLYCERIN (NITROSTAT) 0.4 MG SL tablet Place 1 tablet (0.4 mg total) under the tongue every 5 (five) minutes as needed for chest pain. (Patient not taking: No sig reported) 30 tablet 12   ondansetron (ZOFRAN ODT) 4 MG disintegrating tablet Take 1 tablet (4 mg total) by mouth every 8 (eight) hours as needed. (Patient not taking: Reported on 02/25/2021) 20 tablet 0   No current facility-administered medications for this visit.     Marland Kitchen  PHYSICAL EXAMINATION:   Vitals:   02/25/21 1326  BP: 126/66  Pulse: 86  Resp: 20  Temp: (!) 97.2 F (36.2 C)   Filed Weights   02/25/21 1326  Weight: 263 lb (119.3 kg)    Physical Exam HENT:     Head: Normocephalic and atraumatic.     Mouth/Throat:     Pharynx: No oropharyngeal exudate.  Eyes:     Pupils: Pupils are equal, round, and reactive to light.  Cardiovascular:     Rate and Rhythm: Normal rate and regular rhythm.  Pulmonary:     Effort: No respiratory distress.     Breath sounds: No wheezing.  Abdominal:     General: Bowel sounds are  normal. There is no distension.     Palpations: Abdomen is soft. There is no mass.     Tenderness: no abdominal tenderness There is no guarding or rebound.  Musculoskeletal:        General: No tenderness. Normal range of motion.     Cervical back: Normal range of motion and neck supple.  Skin:    General: Skin is warm.  Neurological:     Mental Status: She is alert and oriented to person, place, and time.  Psychiatric:        Mood and Affect: Affect normal.     LABORATORY DATA:  I have reviewed the data as listed Lab Results  Component Value Date   WBC 9.1 02/25/2021   HGB 13.7 02/25/2021   HCT 42.7 02/25/2021   MCV 87.1 02/25/2021   PLT 117 (L) 02/25/2021   Recent Labs    04/23/20 0442 08/20/20 0958 02/25/21 1306  NA 138  139 139  K 3.4* 3.8 3.3*  CL 103 102 103  CO2 23 28 30   GLUCOSE 152* 118* 146*  BUN 17 12 13   CREATININE 0.83 0.73 0.90  CALCIUM 9.4 9.1 9.1  GFRNONAA >60 >60 >60  PROT 7.8 7.6 7.5  ALBUMIN 4.2 3.9 3.9  AST 37 24 23  ALT 30 24 20   ALKPHOS 61 64 63  BILITOT 0.9 0.7 0.9     No results found.  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  Platelets today are 117 STABLE;  Asymptomatic.  Would not recommend treatment unless less than 50 or symptomatic./See below  #Possible knee surgery [s/p cortisone shot; Dr.Bowers]; recommend in follow-up as if she needed hematology clearance; try to lose weight.  # DM/ BG- PBF- 146- [cereal]; recommend cutting down processed food; eating more vegetables.  # CAD/MI- [Aug 2019]- asprin; Effient ] [Dr.Parschoes/]-STABLE.   #Smoker : 2 cig/day- not a candidate for LCSP.  Counseled to quit smoking.  # DISPOSITION: # follow up in 6 month-MD: labs- cbc/cmp;LDH/b12 leevls- Dr.B    , MD 02/25/2021 1:52 PM

## 2021-02-25 NOTE — Addendum Note (Signed)
Addended by: Keitha Butte on: 02/25/2021 01:57 PM   Modules accepted: Orders

## 2021-02-25 NOTE — Assessment & Plan Note (Signed)
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  Platelets today are 117 STABLE;  Asymptomatic.  Would not recommend treatment unless less than 50 or symptomatic./See below  #Possible knee surgery [s/p cortisone shot; Dr.Bowers]; recommend in follow-up as if she needed hematology clearance; try to lose weight.  # DM/ BG- PBF- 146- [cereal]; recommend cutting down processed food; eating more vegetables.  # CAD/MI- [Aug 2019]- asprin; Effient Jocundus.Bianchi ] [Dr.Parschoes/]-STABLE.   #Smoker : 2 cig/day- not a candidate for LCSP.  Counseled to quit smoking.  # DISPOSITION: # follow up in 6 month-MD: labs- cbc/cmp;LDH/b12 leevls- Dr.B

## 2021-03-14 ENCOUNTER — Telehealth: Payer: Self-pay | Admitting: *Deleted

## 2021-03-14 NOTE — Telephone Encounter (Signed)
Patient called asking if she is able to donate blood with her condition or not. Please advise

## 2021-03-15 NOTE — Telephone Encounter (Signed)
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 Pt thanked me for calling. She decided not to donate.

## 2021-03-15 NOTE — Telephone Encounter (Signed)
Dr. B is ok with patient donating blood.

## 2021-04-11 ENCOUNTER — Other Ambulatory Visit: Payer: Self-pay | Admitting: Physician Assistant

## 2021-04-11 DIAGNOSIS — Z1231 Encounter for screening mammogram for malignant neoplasm of breast: Secondary | ICD-10-CM

## 2021-05-01 ENCOUNTER — Other Ambulatory Visit: Payer: Self-pay

## 2021-05-01 ENCOUNTER — Ambulatory Visit
Admission: RE | Admit: 2021-05-01 | Discharge: 2021-05-01 | Disposition: A | Discharge status: Home / Self Care | Payer: Medicare Other | Source: Ambulatory Visit | Attending: Physician Assistant | Admitting: Physician Assistant

## 2021-05-01 DIAGNOSIS — Z1231 Encounter for screening mammogram for malignant neoplasm of breast: Secondary | ICD-10-CM | POA: Insufficient documentation

## 2021-06-06 ENCOUNTER — Other Ambulatory Visit: Payer: Self-pay | Admitting: Physician Assistant

## 2021-06-06 DIAGNOSIS — M542 Cervicalgia: Secondary | ICD-10-CM

## 2021-06-06 DIAGNOSIS — R200 Anesthesia of skin: Secondary | ICD-10-CM

## 2021-06-06 DIAGNOSIS — M5412 Radiculopathy, cervical region: Secondary | ICD-10-CM

## 2021-06-20 ENCOUNTER — Ambulatory Visit
Admission: RE | Admit: 2021-06-20 | Discharge: 2021-06-20 | Disposition: A | Discharge status: Home / Self Care | Payer: Medicare Other | Source: Ambulatory Visit | Attending: Physician Assistant | Admitting: Physician Assistant

## 2021-06-20 DIAGNOSIS — M5412 Radiculopathy, cervical region: Secondary | ICD-10-CM | POA: Insufficient documentation

## 2021-06-20 DIAGNOSIS — M542 Cervicalgia: Secondary | ICD-10-CM | POA: Insufficient documentation

## 2021-06-20 DIAGNOSIS — R202 Paresthesia of skin: Secondary | ICD-10-CM | POA: Diagnosis present

## 2021-06-20 DIAGNOSIS — R200 Anesthesia of skin: Secondary | ICD-10-CM | POA: Diagnosis present

## 2021-07-24 DIAGNOSIS — R002 Palpitations: Secondary | ICD-10-CM | POA: Insufficient documentation

## 2021-08-26 ENCOUNTER — Encounter: Payer: Self-pay | Admitting: Internal Medicine

## 2021-08-26 ENCOUNTER — Inpatient Hospital Stay (HOSPITAL_BASED_OUTPATIENT_CLINIC_OR_DEPARTMENT_OTHER): Payer: Medicare Other | Admitting: Internal Medicine

## 2021-08-26 ENCOUNTER — Inpatient Hospital Stay: Payer: Medicare Other | Attending: Internal Medicine

## 2021-08-26 ENCOUNTER — Other Ambulatory Visit: Payer: Self-pay

## 2021-08-26 DIAGNOSIS — Z79899 Other long term (current) drug therapy: Secondary | ICD-10-CM | POA: Diagnosis not present

## 2021-08-26 DIAGNOSIS — F17210 Nicotine dependence, cigarettes, uncomplicated: Secondary | ICD-10-CM | POA: Diagnosis not present

## 2021-08-26 DIAGNOSIS — Z7984 Long term (current) use of oral hypoglycemic drugs: Secondary | ICD-10-CM | POA: Insufficient documentation

## 2021-08-26 DIAGNOSIS — D696 Thrombocytopenia, unspecified: Secondary | ICD-10-CM | POA: Diagnosis not present

## 2021-08-26 DIAGNOSIS — I2510 Atherosclerotic heart disease of native coronary artery without angina pectoris: Secondary | ICD-10-CM | POA: Insufficient documentation

## 2021-08-26 DIAGNOSIS — Z7982 Long term (current) use of aspirin: Secondary | ICD-10-CM | POA: Insufficient documentation

## 2021-08-26 LAB — CBC WITH DIFFERENTIAL/PLATELET
Abs Immature Granulocytes: 0.03 10*3/uL (ref 0.00–0.07)
Basophils Absolute: 0 10*3/uL (ref 0.0–0.1)
Basophils Relative: 0 %
Eosinophils Absolute: 0.3 10*3/uL (ref 0.0–0.5)
Eosinophils Relative: 3 %
HCT: 44.7 % (ref 36.0–46.0)
Hemoglobin: 14.6 g/dL (ref 12.0–15.0)
Immature Granulocytes: 0 %
Lymphocytes Relative: 49 %
Lymphs Abs: 4.5 10*3/uL — ABNORMAL HIGH (ref 0.7–4.0)
MCH: 28.2 pg (ref 26.0–34.0)
MCHC: 32.7 g/dL (ref 30.0–36.0)
MCV: 86.3 fL (ref 80.0–100.0)
Monocytes Absolute: 0.6 10*3/uL (ref 0.1–1.0)
Monocytes Relative: 6 %
Neutro Abs: 3.9 10*3/uL (ref 1.7–7.7)
Neutrophils Relative %: 42 %
Platelets: 121 10*3/uL — ABNORMAL LOW (ref 150–400)
RBC: 5.18 MIL/uL — ABNORMAL HIGH (ref 3.87–5.11)
RDW: 15.2 % (ref 11.5–15.5)
WBC: 9.3 10*3/uL (ref 4.0–10.5)
nRBC: 0 % (ref 0.0–0.2)

## 2021-08-26 LAB — COMPREHENSIVE METABOLIC PANEL
ALT: 21 U/L (ref 0–44)
AST: 22 U/L (ref 15–41)
Albumin: 3.7 g/dL (ref 3.5–5.0)
Alkaline Phosphatase: 60 U/L (ref 38–126)
Anion gap: 6 (ref 5–15)
BUN: 14 mg/dL (ref 8–23)
CO2: 29 mmol/L (ref 22–32)
Calcium: 9.1 mg/dL (ref 8.9–10.3)
Chloride: 101 mmol/L (ref 98–111)
Creatinine, Ser: 0.79 mg/dL (ref 0.44–1.00)
GFR, Estimated: >60 mL/min (ref >60)
Glucose, Bld: 123 mg/dL — ABNORMAL HIGH (ref 70–99)
Potassium: 3.5 mmol/L (ref 3.5–5.1)
Sodium: 136 mmol/L (ref 135–145)
Total Bilirubin: 0.4 mg/dL (ref 0.3–1.2)
Total Protein: 7.5 g/dL (ref 6.5–8.1)

## 2021-08-26 LAB — VITAMIN B12: Vitamin B-12: 325 pg/mL (ref 180–914)

## 2021-08-26 LAB — LACTATE DEHYDROGENASE: LDH: 109 U/L (ref 98–192)

## 2021-08-26 NOTE — Assessment & Plan Note (Addendum)
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  Platelets today are 121- STABLE;  Asymptomatic.  Would not recommend treatment unless less than 50 or symptomatic./See below  #Possible knee surgery [s/p cortisone shot; Dr.Bowers]; recommend in follow-up as if she needed hematology clearance; try to lose weight.  # CAD/MI- [Aug 2019]- asprin; Effient Jocundus.Bianchi ] [Dr.Parschoes/]-STABLE.   # DISPOSITION: # follow up in 6 month-MD: labs- cbc/cmp;LDH/b12 leevls- Dr.B

## 2021-08-26 NOTE — Progress Notes (Signed)
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  Karen Potts 61 y.o.  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  Denies any easy bruising or bleeding.  Patient has had steroid injections for back pain. Patient is trying to lose weight to avoid knee surgery. Currently on PT.   Review of Systems  Constitutional:  Positive for malaise/fatigue. Negative for chills, diaphoresis, fever and weight loss.  HENT:  Negative for nosebleeds and sore throat.   Eyes:  Negative for double vision.  Respiratory:  Positive for shortness of breath. Negative for cough, hemoptysis, sputum production and wheezing.   Cardiovascular:  Negative for chest pain, palpitations, orthopnea and leg swelling.  Gastrointestinal:  Positive for diarrhea. Negative for abdominal pain, blood in stool, constipation, heartburn, melena, nausea and vomiting.  Genitourinary:  Negative for dysuria, frequency and urgency.  Musculoskeletal:  Positive for back pain and joint pain.  Skin: Negative.  Negative for itching and rash.  Neurological:  Negative for dizziness, tingling, focal weakness, weakness and headaches.  Endo/Heme/Allergies:  Does not bruise/bleed easily.  Psychiatric/Behavioral:  Negative for depression. The patient is not nervous/anxious and does not have insomnia.     MEDICAL HISTORY:  Past Medical History:  Diagnosis Date   Bulging lumbar disc  2022   Diabetes mellitus without complication (HCC)    Hypertension    MI, old 2019   Sleep apnea    Vertigo     SURGICAL HISTORY: Past Surgical History:  Procedure Laterality Date   ABDOMINAL HYSTERECTOMY     BREAST BIOPSY Left 10/2017    APOCRINE TYPE CYST WITH FIBROSIS Of the Wall   CORONARY STENT INTERVENTION N/A 02/24/2018   Procedure: CORONARY STENT INTERVENTION;  Surgeon: Marcina Millard, MD;  Location: [facility] INVASIVE CV LAB;  Service: Cardiovascular;  Laterality: N/A;   LEFT HEART CATH AND CORONARY ANGIOGRAPHY Right 02/24/2018   Procedure: LEFT HEART CATH AND CORONARY ANGIOGRAPHY;  Surgeon: Marcina Millard, MD;  Location: [facility] INVASIVE CV LAB;  Service: Cardiovascular;  Laterality: Right;   SHOULDER ARTHROSCOPY WITH OPEN ROTATOR CUFF REPAIR Left 08/14/2017   Procedure: SHOULDER ARTHROSCOPY WITH ROTATOR CUFF REPAIR, BICEPS TENOTOMY, SUBACROMIAL DECOMPRESSION;  Surgeon: Lyndle Herrlich, MD;  Location: [facility] ORS;  Service: Orthopedics;  Laterality: Left;   UMBILICAL HERNIA REPAIR      SOCIAL HISTORY: Social History   Socioeconomic History   Marital status: Married    Spouse name: Not on file   Number of children: Not on file   Years of education: Not on file   Highest education level: Not on file  Occupational History   Not on file  Tobacco Use   Smoking status: Some Days    Types: Cigarettes    Last attempt to quit: 02/24/2018    Years since quitting: 3.5   Smokeless tobacco: Never   Tobacco comments:    Sister said she quit smoking the day of her heart attack.  Vaping Use   Vaping Use: Never used  Substance and Sexual Activity   Alcohol use: No  Drug use: No   Sexual activity: Yes    Birth control/protection: Condom  Other Topics Concern   Not on file  Social History Narrative   Not working/disability; smoking; no alcohol. In [place]/ lives with a friend.    Social Determinants of Health   Financial Resource Strain: Not on file  Food  Insecurity: Not on file  Transportation Needs: Not on file  Physical Activity: Not on file  Stress: Not on file  Social Connections: Not on file  Intimate Partner Violence: Not on file    FAMILY HISTORY: Family History  Problem Relation Age of Onset   Breast cancer Neg Hx     ALLERGIES:  has No Known Allergies.  MEDICATIONS:  Current Outpatient Medications  Medication Sig Dispense Refill   aspirin EC 81 MG tablet Take 81 mg by mouth daily.     atorvastatin (LIPITOR) 80 MG tablet Take 1 tablet (80 mg total) by mouth daily at 6 PM. 30 tablet 0   lisinopril-hydrochlorothiazide (PRINZIDE,ZESTORETIC) 10-12.5 MG tablet TAKE 1 TABLET BY MOUTH ONCE DAILY 90 tablet 1   metFORMIN (GLUCOPHAGE) 500 MG tablet Take 500 mg by mouth daily with breakfast.     loratadine (CLARITIN) 10 MG tablet Take by mouth. (Patient not taking: Reported on 02/25/2021)     meclizine (ANTIVERT) 12.5 MG tablet Take 1 tablet (12.5 mg total) by mouth 3 (three) times daily as needed for dizziness or nausea. (Patient not taking: Reported on 02/25/2021) 30 tablet 1   nitroGLYCERIN (NITROSTAT) 0.4 MG SL tablet Place 1 tablet (0.4 mg total) under the tongue every 5 (five) minutes as needed for chest pain. (Patient not taking: Reported on 08/20/2020) 30 tablet 12   ondansetron (ZOFRAN ODT) 4 MG disintegrating tablet Take 1 tablet (4 mg total) by mouth every 8 (eight) hours as needed. (Patient not taking: Reported on 02/25/2021) 20 tablet 0   No current facility-administered medications for this visit.     Marland Kitchen  PHYSICAL EXAMINATION:   Vitals:   08/26/21 1300  BP: 119/79  Pulse: 73  Resp: 18  Temp: 98.3 F (36.8 C)   Filed Weights   08/26/21 1300  Weight: 262 lb (118.8 kg)    Physical Exam HENT:     Head: Normocephalic and atraumatic.     Mouth/Throat:     Pharynx: No oropharyngeal exudate.  Eyes:     Pupils: Pupils are equal, round, and reactive to light.  Cardiovascular:     Rate and Rhythm: Normal rate and  regular rhythm.  Pulmonary:     Effort: No respiratory distress.     Breath sounds: No wheezing.  Abdominal:     General: Bowel sounds are normal. There is no distension.     Palpations: Abdomen is soft. There is no mass.     Tenderness: There is no abdominal tenderness. There is no guarding or rebound.  Musculoskeletal:        General: No tenderness. Normal range of motion.     Cervical back: Normal range of motion and neck supple.  Skin:    General: Skin is warm.  Neurological:     Mental Status: She is alert and oriented to person, place, and time.  Psychiatric:        Mood and Affect: Affect normal.     LABORATORY DATA:  I have reviewed the data as listed Lab Results  Component Value Date   WBC 9.3 08/26/2021   HGB 14.6 08/26/2021   HCT 44.7 08/26/2021  MCV 86.3 08/26/2021   PLT 121 (L) 08/26/2021   Recent Labs    02/25/21 1306 08/26/21 1318  NA 139 136  K 3.3* 3.5  CL 103 101  CO2 30 29  GLUCOSE 146* 123*  BUN 13 14  CREATININE 0.90 0.79  CALCIUM 9.1 9.1  GFRNONAA >60 >60  PROT 7.5 7.5  ALBUMIN 3.9 3.7  AST 23 22  ALT 20 21  ALKPHOS 63 60  BILITOT 0.9 0.4     No results found.  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  Platelets today are 121- STABLE;  Asymptomatic.  Would not recommend treatment unless less than 50 or symptomatic./See below  #Possible knee surgery [s/p cortisone shot; Dr.Bowers]; recommend in follow-up as if she needed hematology clearance; try to lose weight.  # CAD/MI- [Aug 2019]- asprin; Effient Jocundus.Bianchi ] [Dr.Parschoes/]-STABLE.   # DISPOSITION: # follow up in 6 month-MD: labs- cbc/cmp;LDH/b12 leevls- Dr.B    Earna Coder, MD 08/26/2021 2:16 PM

## 2021-08-26 NOTE — Progress Notes (Signed)
Patient denies new problems/concerns today.   °

## 2021-12-15 ENCOUNTER — Other Ambulatory Visit: Payer: Self-pay

## 2021-12-15 ENCOUNTER — Emergency Department
Admission: EM | Admit: 2021-12-15 | Discharge: 2021-12-15 | Disposition: A | Discharge status: Home / Self Care | Payer: Medicare Other | Attending: Emergency Medicine | Admitting: Emergency Medicine

## 2021-12-15 ENCOUNTER — Encounter: Payer: Self-pay | Admitting: Emergency Medicine

## 2021-12-15 ENCOUNTER — Emergency Department: Payer: Medicare Other

## 2021-12-15 DIAGNOSIS — R519 Headache, unspecified: Secondary | ICD-10-CM | POA: Diagnosis present

## 2021-12-15 DIAGNOSIS — D696 Thrombocytopenia, unspecified: Secondary | ICD-10-CM | POA: Diagnosis not present

## 2021-12-15 DIAGNOSIS — E119 Type 2 diabetes mellitus without complications: Secondary | ICD-10-CM | POA: Diagnosis not present

## 2021-12-15 DIAGNOSIS — R42 Dizziness and giddiness: Secondary | ICD-10-CM | POA: Insufficient documentation

## 2021-12-15 DIAGNOSIS — I10 Essential (primary) hypertension: Secondary | ICD-10-CM | POA: Diagnosis not present

## 2021-12-15 DIAGNOSIS — I2510 Atherosclerotic heart disease of native coronary artery without angina pectoris: Secondary | ICD-10-CM | POA: Diagnosis not present

## 2021-12-15 DIAGNOSIS — R197 Diarrhea, unspecified: Secondary | ICD-10-CM | POA: Diagnosis not present

## 2021-12-15 DIAGNOSIS — E876 Hypokalemia: Secondary | ICD-10-CM | POA: Diagnosis not present

## 2021-12-15 LAB — COMPREHENSIVE METABOLIC PANEL
ALT: 27 U/L (ref 0–44)
AST: 26 U/L (ref 15–41)
Albumin: 4.1 g/dL (ref 3.5–5.0)
Alkaline Phosphatase: 74 U/L (ref 38–126)
Anion gap: 7 (ref 5–15)
BUN: 14 mg/dL (ref 8–23)
CO2: 27 mmol/L (ref 22–32)
Calcium: 9.4 mg/dL (ref 8.9–10.3)
Chloride: 107 mmol/L (ref 98–111)
Creatinine, Ser: 0.66 mg/dL (ref 0.44–1.00)
GFR, Estimated: >60 mL/min (ref >60)
Glucose, Bld: 120 mg/dL — ABNORMAL HIGH (ref 70–99)
Potassium: 3.2 mmol/L — ABNORMAL LOW (ref 3.5–5.1)
Sodium: 141 mmol/L (ref 135–145)
Total Bilirubin: 0.7 mg/dL (ref 0.3–1.2)
Total Protein: 7.9 g/dL (ref 6.5–8.1)

## 2021-12-15 LAB — CBC
HCT: 44.2 % (ref 36.0–46.0)
Hemoglobin: 14.1 g/dL (ref 12.0–15.0)
MCH: 27.7 pg (ref 26.0–34.0)
MCHC: 31.9 g/dL (ref 30.0–36.0)
MCV: 86.8 fL (ref 80.0–100.0)
Platelets: 104 10*3/uL — ABNORMAL LOW (ref 150–400)
RBC: 5.09 MIL/uL (ref 3.87–5.11)
RDW: 14.6 % (ref 11.5–15.5)
WBC: 11.2 10*3/uL — ABNORMAL HIGH (ref 4.0–10.5)
nRBC: 0 % (ref 0.0–0.2)

## 2021-12-15 LAB — DIFFERENTIAL
Abs Immature Granulocytes: 0.03 10*3/uL (ref 0.00–0.07)
Basophils Absolute: 0 10*3/uL (ref 0.0–0.1)
Basophils Relative: 0 %
Eosinophils Absolute: 0.4 10*3/uL (ref 0.0–0.5)
Eosinophils Relative: 3 %
Immature Granulocytes: 0 %
Lymphocytes Relative: 59 %
Lymphs Abs: 6.6 10*3/uL — ABNORMAL HIGH (ref 0.7–4.0)
Monocytes Absolute: 0.5 10*3/uL (ref 0.1–1.0)
Monocytes Relative: 5 %
Neutro Abs: 3.7 10*3/uL (ref 1.7–7.7)
Neutrophils Relative %: 33 %

## 2021-12-15 LAB — PROTIME-INR
INR: 1 (ref 0.8–1.2)
Prothrombin Time: 13.4 seconds (ref 11.4–15.2)

## 2021-12-15 LAB — APTT: aPTT: 27 seconds (ref 24–36)

## 2021-12-15 MED ORDER — CLONIDINE HCL 0.1 MG PO TABS
0.1000 mg | ORAL_TABLET | Freq: Once | ORAL | Status: AC
Start: 2021-12-15 — End: 2021-12-15
  Administered 2021-12-15: 0.1 mg via ORAL
  Filled 2021-12-15: qty 1

## 2021-12-15 MED ORDER — SODIUM CHLORIDE 0.9 % IV BOLUS
1000.0000 mL | Freq: Once | INTRAVENOUS | Status: AC
  Administered 2021-12-15: 1000 mL via INTRAVENOUS

## 2021-12-15 MED ORDER — SODIUM CHLORIDE 0.9% FLUSH: 3.0000 mL | Freq: Once | INTRAVENOUS | Status: DC

## 2021-12-15 NOTE — ED Provider Notes (Signed)
[facility] Provider Note    Event Date/Time   First MD Initiated Contact with Patient 12/15/21 [phone]     (approximate)   History   Headache   HPI  Karen Potts is a 61 y.o. female with history of diabetes, hypertension, thrombocytopenia, and CAD who presents with dizziness since yesterday evening, persistent course, described as feeling "woozy" or lightheaded, but not vertigo or spinning.  The patient has a history of vertigo and states that this feels different.  She states that around the same time yesterday evening she started to have blurred vision in both eyes.  She noted that her blood pressure was elevated as high as 200 systolic.  She denies ever having an actual headache.  She also denies any associated chest pain or difficulty breathing.  She has no weakness or numbness, although she states she felt somewhat unsteady walking due to the lightheadedness.  The patient had attended a cookout earlier in the evening and ate hamburgers which she does not normally do, but has not been having any vomiting or abdominal pain.  However she does endorse watery diarrhea for the last 3 days and states she thinks she might be dehydrated.   Physical Exam   Triage Vital Signs: ED Triage Vitals  Enc Vitals Group     BP 12/15/21 0154 (!) 168/70     Pulse Rate 12/15/21 0154 (!) 50     Resp 12/15/21 0154 20     Temp 12/15/21 0154 97.8 F (36.6 C)     Temp Source 12/15/21 0154 Oral     SpO2 12/15/21 0154 96 %     Weight 12/15/21 0153 258 lb (117 kg)     Height 12/15/21 0153 5\' 7"  (1.702 m)     Head Circumference --      Peak Flow --      Pain Score 12/15/21 0153 7     Pain Loc --      Pain Edu? --      Excl. in GC? --     Most recent vital signs: Vitals:   12/15/21 1055 12/15/21 1130  BP: (!) 173/51 (!) 161/66  Pulse:  (!) 54  Resp:  18  Temp:    SpO2:  100%     General: Alert and oriented, well-appearing. CV:  Good peripheral perfusion.   Resp:  Normal effort.  Abd:  No distention.  Other:  EOMI.  PERRLA.  Cranial nerves III through XII grossly intact.  Motor intact in all extremities.  No pronator drift.  No ataxia.  Normal gait.   ED Results / Procedures / Treatments   Labs (all labs ordered are listed, but only abnormal results are displayed) Labs Reviewed  CBC - Abnormal; Notable for the following components:      Result Value   WBC 11.2 (*)    Platelets 104 (*)    All other components within normal limits  DIFFERENTIAL - Abnormal; Notable for the following components:   Lymphs Abs 6.6 (*)    All other components within normal limits  COMPREHENSIVE METABOLIC PANEL - Abnormal; Notable for the following components:   Potassium 3.2 (*)    Glucose, Bld 120 (*)    All other components within normal limits  PROTIME-INR  APTT  ETHANOL  CBG MONITORING, ED     EKG  ED ECG REPORT I, 12/17/21, the attending physician, personally viewed and interpreted this ECG.  Date: 12/15/2021 EKG Time: 0157 Rate: 52 Rhythm:  normal sinus rhythm QRS Axis: normal Intervals: normal ST/T Wave abnormalities: normal Narrative Interpretation: no evidence of acute ischemia    RADIOLOGY  CT head: I independently viewed and interpreted the images; there is no ICH or evidence of acute CVA.  Radiology report confirms no acute abnormalities.  PROCEDURES:  Critical Care performed: No  Procedures   MEDICATIONS ORDERED IN ED: Medications  sodium chloride 0.9 % bolus 1,000 mL (0 mLs Intravenous Stopped 12/15/21 0952)  cloNIDine (CATAPRES) tablet 0.1 mg (0.1 mg Oral Given 12/15/21 1055)     IMPRESSION / MDM / ASSESSMENT AND PLAN / ED COURSE  I reviewed the triage vital signs and the nursing notes.  61 year old female with PMH as noted above presents apparently with lightheadedness associated with elevated blood pressure, mild blurred vision, and feeling unsteady.  I reviewed the past medical records.  The patient  has no recent ED visits or admissions.  She follows with hematology for thrombocytopenia, thought to be likely ITP.  And was last seen there on 2/27.  Exam is unremarkable.  The patient's blood pressure is elevated but other vital signs are normal.  Neurologic exam is normal.  The patient feels lightheaded when standing up but has steady gait.  Differential diagnosis includes, but is not limited to, dehydration/hypovolemia (especially given the recent diarrhea), electrolyte abnormality or other metabolic cause, symptoms secondary to hypertension exacerbation, or less likely CNS cause.  I do not suspect stroke or TIA given the lack of any focal neurologic symptoms and normal neurologic exam.  Patient's presentation is most consistent with acute presentation with potential threat to life or bodily function.  CT head was obtained from triage and is negative.  EKG is normal.  Lab work-up is unremarkable except for borderline low potassium.  Platelets are at baseline.  The patient's blood pressure has improved without intervention and was in the 140s during my examination.  The blurred vision has resolved.  However she still feels lightheaded standing up.  We will give a liter of fluid and reassess.  The patient is on the cardiac monitor to evaluate for evidence of arrhythmia and/or significant heart rate changes.  ----------------------------------------- 11:38 AM on 12/15/2021 -----------------------------------------  The patient is feeling significantly better after fluids.  She was able to get up and ambulate without any difficulty or unsteadiness.  Her blood pressure went up a bit again to the 180s so I gave a low-dose of p.o. clonidine.  It is now in the 160s.  Overall I suspect that the patient's symptoms are related either to dehydration after the recent diarrhea or the elevated blood pressure.  There is no evidence of hypertensive crisis or any endorgan dysfunction.  There is no evidence of  CVA or TIA.  At this time, the patient is stable for discharge home.  I counseled her on the results of the work-up.  I gave her thorough return precautions and she expressed understanding.    FINAL CLINICAL IMPRESSION(S) / ED DIAGNOSES   Final diagnoses:  Hypertension, unspecified type  Dizziness     Rx / DC Orders   ED Discharge Orders     None        Note:  This document was prepared using Dragon voice recognition software and may include unintentional dictation errors.    Dionne Bucy, MD 12/15/21 1139

## 2021-12-15 NOTE — ED Triage Notes (Signed)
FIRST NURSE NOTE:  PT arrived via [facility] with c/o high BP, c/o nausea, blurred vision  200/78  prior to arrival 167/98  EMS also reports dehydration

## 2021-12-15 NOTE — ED Notes (Signed)
Pt ambulated without getting dizzy or lightheaded. Provider notified.

## 2021-12-15 NOTE — Discharge Instructions (Signed)
Continue take your normal blood pressure medications as prescribed.  Make sure to drink plenty of fluids.  Follow-up with your regular doctor.  Return to the ER immediately for new, worsening, or persistent severe dizziness, lightheadedness, feeling unsteady, headache, chest pain, vomiting, persistently elevated blood pressures (especially over 180-200 on the top number or 120 on the bottom number), or any other new or worsening symptoms that concern you.

## 2021-12-15 NOTE — ED Triage Notes (Signed)
Pt to ED via POV with c/o lightheadedness/dizziness, nausea that started at approx 1900. Pt states symptoms have gradually worsened at this time. Pt states feels dizzy, blurry vision to R eye. Pt A&O x4. Pt also c/o slight diarrhea.

## 2022-02-24 ENCOUNTER — Inpatient Hospital Stay: Payer: Medicare Other | Attending: Nurse Practitioner

## 2022-02-24 ENCOUNTER — Ambulatory Visit: Payer: Medicare Other | Admitting: Internal Medicine

## 2022-02-24 ENCOUNTER — Inpatient Hospital Stay (HOSPITAL_BASED_OUTPATIENT_CLINIC_OR_DEPARTMENT_OTHER): Payer: Medicare Other | Admitting: Nurse Practitioner

## 2022-02-24 VITALS — BP 108/81 | HR 64 | Temp 96.9°F | Resp 16 | Wt 262.0 lb

## 2022-02-24 DIAGNOSIS — D72829 Elevated white blood cell count, unspecified: Secondary | ICD-10-CM

## 2022-02-24 DIAGNOSIS — Z7982 Long term (current) use of aspirin: Secondary | ICD-10-CM | POA: Insufficient documentation

## 2022-02-24 DIAGNOSIS — D696 Thrombocytopenia, unspecified: Secondary | ICD-10-CM | POA: Diagnosis present

## 2022-02-24 DIAGNOSIS — I252 Old myocardial infarction: Secondary | ICD-10-CM | POA: Diagnosis not present

## 2022-02-24 DIAGNOSIS — F17210 Nicotine dependence, cigarettes, uncomplicated: Secondary | ICD-10-CM | POA: Diagnosis not present

## 2022-02-24 DIAGNOSIS — I2510 Atherosclerotic heart disease of native coronary artery without angina pectoris: Secondary | ICD-10-CM | POA: Insufficient documentation

## 2022-02-24 LAB — CBC WITH DIFFERENTIAL/PLATELET
Abs Immature Granulocytes: 0.03 10*3/uL (ref 0.00–0.07)
Basophils Absolute: 0 10*3/uL (ref 0.0–0.1)
Basophils Relative: 0 %
Eosinophils Absolute: 0.4 10*3/uL (ref 0.0–0.5)
Eosinophils Relative: 3 %
HCT: 41.9 % (ref 36.0–46.0)
Hemoglobin: 13.9 g/dL (ref 12.0–15.0)
Immature Granulocytes: 0 %
Lymphocytes Relative: 50 %
Lymphs Abs: 5.9 10*3/uL — ABNORMAL HIGH (ref 0.7–4.0)
MCH: 28.1 pg (ref 26.0–34.0)
MCHC: 33.2 g/dL (ref 30.0–36.0)
MCV: 84.8 fL (ref 80.0–100.0)
Monocytes Absolute: 0.8 10*3/uL (ref 0.1–1.0)
Monocytes Relative: 6 %
Neutro Abs: 5 10*3/uL (ref 1.7–7.7)
Neutrophils Relative %: 41 %
Platelets: 112 10*3/uL — ABNORMAL LOW (ref 150–400)
RBC: 4.94 MIL/uL (ref 3.87–5.11)
RDW: 15.2 % (ref 11.5–15.5)
Smear Review: NORMAL
WBC: 12.2 10*3/uL — ABNORMAL HIGH (ref 4.0–10.5)
nRBC: 0 % (ref 0.0–0.2)

## 2022-02-24 LAB — COMPREHENSIVE METABOLIC PANEL
ALT: 27 U/L (ref 0–44)
AST: 24 U/L (ref 15–41)
Albumin: 3.8 g/dL (ref 3.5–5.0)
Alkaline Phosphatase: 69 U/L (ref 38–126)
Anion gap: 7 (ref 5–15)
BUN: 16 mg/dL (ref 8–23)
CO2: 25 mmol/L (ref 22–32)
Calcium: 8.9 mg/dL (ref 8.9–10.3)
Chloride: 106 mmol/L (ref 98–111)
Creatinine, Ser: 0.74 mg/dL (ref 0.44–1.00)
GFR, Estimated: >60 mL/min (ref >60)
Glucose, Bld: 122 mg/dL — ABNORMAL HIGH (ref 70–99)
Potassium: 3.4 mmol/L — ABNORMAL LOW (ref 3.5–5.1)
Sodium: 138 mmol/L (ref 135–145)
Total Bilirubin: 0.5 mg/dL (ref 0.3–1.2)
Total Protein: 7.3 g/dL (ref 6.5–8.1)

## 2022-02-24 LAB — VITAMIN B12: Vitamin B-12: 342 pg/mL (ref 180–914)

## 2022-02-24 LAB — LACTATE DEHYDROGENASE: LDH: 106 U/L (ref 98–192)

## 2022-02-24 NOTE — Progress Notes (Signed)
Returns for follow-up. Since last visit, she reports that she was hospitalized for hypertension. No changes were made to her medications at that time. She is currently holding metformin, per pcp instruction, due to recent diarrhea.

## 2022-02-24 NOTE — Progress Notes (Signed)
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  Karen Potts 61 y.o.  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 Received steroids in her knee. No abnormal bleeding or bruising. No blood in stool.    Review of Systems  Constitutional:  Positive for malaise/fatigue. Negative for chills, fever and weight loss.  HENT:  Negative for hearing loss, nosebleeds, sore throat and tinnitus.   Eyes:  Negative for blurred vision and double vision.  Respiratory:  Negative for cough, hemoptysis, shortness of breath and wheezing.   Cardiovascular:  Negative for chest pain, palpitations and leg swelling.  Gastrointestinal:  Negative for abdominal pain, blood in stool, constipation, diarrhea, melena, nausea and vomiting.  Genitourinary:  Negative for dysuria and urgency.  Musculoskeletal:  Positive for back pain and joint pain. Negative for falls and myalgias.  Skin:  Negative for itching and rash.  Neurological:  Negative for dizziness, tingling, sensory change, loss of consciousness, weakness and headaches.  Endo/Heme/Allergies:  Negative for environmental allergies. Does not bruise/bleed easily.  Psychiatric/Behavioral:  Negative for depression. The patient is not nervous/anxious and does not have insomnia.      MEDICAL HISTORY:  Past Medical History:  Diagnosis Date   Bulging lumbar disc 2022   Diabetes mellitus  without complication (HCC)    Hypertension    MI, old 2019   Sleep apnea    Vertigo     SURGICAL HISTORY: Past Surgical History:  Procedure Laterality Date   ABDOMINAL HYSTERECTOMY     BREAST BIOPSY Left 10/2017    APOCRINE TYPE CYST WITH FIBROSIS Of the Wall   CORONARY STENT INTERVENTION N/A 02/24/2018   Procedure: CORONARY STENT INTERVENTION;  Surgeon: Marcina Millard, MD;  Location: [facility] INVASIVE CV LAB;  Service: Cardiovascular;  Laterality: N/A;   LEFT HEART CATH AND CORONARY ANGIOGRAPHY Right 02/24/2018   Procedure: LEFT HEART CATH AND CORONARY ANGIOGRAPHY;  Surgeon: Marcina Millard, MD;  Location: [facility] INVASIVE CV LAB;  Service: Cardiovascular;  Laterality: Right;   SHOULDER ARTHROSCOPY WITH OPEN ROTATOR CUFF REPAIR Left 08/14/2017   Procedure: SHOULDER ARTHROSCOPY WITH ROTATOR CUFF REPAIR, BICEPS TENOTOMY, SUBACROMIAL DECOMPRESSION;  Surgeon: Lyndle Herrlich, MD;  Location: [facility] ORS;  Service: Orthopedics;  Laterality: Left;   UMBILICAL HERNIA REPAIR      SOCIAL HISTORY: Social History   Socioeconomic History   Marital status: Married    Spouse name: Not on file   Number of children: Not on file   Years of education: Not on file   Highest education level: Not on file  Occupational History   Not on file  Tobacco Use   Smoking status: Some Days    Types: Cigarettes    Last attempt to quit: 02/24/2018    Years since quitting: 4.0   Smokeless tobacco: Never   Tobacco comments:    Lurene said she quit smoking the day of her heart attack.  Vaping Use   Vaping Use: Never used  Substance and Sexual Activity   Alcohol use: No   Drug use: No  Sexual activity: Yes    Birth control/protection: Condom  Other Topics Concern   Not on file  Social History Narrative   Not working/disability; smoking; no alcohol. In [place]/ lives with a friend.    Social Determinants of Health   Financial Resource Strain: Not on file  Food Insecurity: Not on file   Transportation Needs: Not on file  Physical Activity: Not on file  Stress: Not on file  Social Connections: Not on file  Intimate Partner Violence: Not on file    FAMILY HISTORY: Family History  Problem Relation Age of Onset   Breast cancer Neg Hx     ALLERGIES:  has No Known Allergies.  MEDICATIONS:  Current Outpatient Medications  Medication Sig Dispense Refill   aspirin EC 81 MG tablet Take 81 mg by mouth daily.     atorvastatin (LIPITOR) 80 MG tablet Take 1 tablet (80 mg total) by mouth daily at 6 PM. 30 tablet 0   lisinopril-hydrochlorothiazide (PRINZIDE,ZESTORETIC) 10-12.5 MG tablet TAKE 1 TABLET BY MOUTH ONCE DAILY 90 tablet 1   pregabalin (LYRICA) 25 MG capsule Take by mouth.     cyclobenzaprine (FLEXERIL) 10 MG tablet Take 10 mg by mouth 2 (two) times daily as needed. (Patient not taking: Reported on 12/15/2021)     loratadine (CLARITIN) 10 MG tablet Take by mouth. (Patient not taking: Reported on 02/25/2021)     meclizine (ANTIVERT) 12.5 MG tablet Take 1 tablet (12.5 mg total) by mouth 3 (three) times daily as needed for dizziness or nausea. (Patient not taking: Reported on 02/25/2021) 30 tablet 1   metFORMIN (GLUCOPHAGE) 500 MG tablet Take 500 mg by mouth daily with breakfast. (Patient not taking: Reported on 02/24/2022)     nitroGLYCERIN (NITROSTAT) 0.4 MG SL tablet Place 1 tablet (0.4 mg total) under the tongue every 5 (five) minutes as needed for chest pain. (Patient not taking: Reported on 08/20/2020) 30 tablet 12   ondansetron (ZOFRAN ODT) 4 MG disintegrating tablet Take 1 tablet (4 mg total) by mouth every 8 (eight) hours as needed. (Patient not taking: Reported on 02/25/2021) 20 tablet 0   No current facility-administered medications for this visit.     Marland Kitchen  PHYSICAL EXAMINATION:   Vitals:   02/24/22 1434  BP: 108/81  Pulse: 64  Resp: 16  Temp: (!) 96.9 F (36.1 C)  SpO2: 100%   Filed Weights   02/24/22 1434  Weight: 262 lb (118.8 kg)    Physical  Exam HENT:     Head: Normocephalic and atraumatic.     Mouth/Throat:     Pharynx: No oropharyngeal exudate.  Eyes:     Pupils: Pupils are equal, round, and reactive to light.  Cardiovascular:     Rate and Rhythm: Normal rate and regular rhythm.  Pulmonary:     Effort: No respiratory distress.     Breath sounds: No wheezing.  Abdominal:     General: Bowel sounds are normal. There is no distension.     Palpations: Abdomen is soft. There is no mass.     Tenderness: There is no abdominal tenderness. There is no guarding or rebound.  Musculoskeletal:        General: No tenderness. Normal range of motion.     Cervical back: Normal range of motion and neck supple.  Skin:    General: Skin is warm.  Neurological:     Mental Status: She is alert and oriented to person, place, and time.  Psychiatric:  Mood and Affect: Affect normal.      LABORATORY DATA:  I have reviewed the data as listed Lab Results  Component Value Date   WBC 12.2 (H) 02/24/2022   HGB 13.9 02/24/2022   HCT 41.9 02/24/2022   MCV 84.8 02/24/2022   PLT 112 (L) 02/24/2022   Recent Labs    08/26/21 1318 12/15/21 0206 02/24/22 1409  NA 136 141 138  K 3.5 3.2* 3.4*  CL 101 107 106  CO2 29 27 25   GLUCOSE 123* 120* 122*  BUN 14 14 16   CREATININE 0.79 0.66 0.74  CALCIUM 9.1 9.4 8.9  GFRNONAA >60 >60 >60  PROT 7.5 7.9 7.3  ALBUMIN 3.7 4.1 3.8  AST 22 26 24   ALT 21 27 27   ALKPHOS 60 74 69  BILITOT 0.4 0.7 0.5      No results found.  ASSESSMENT & PLAN:   No problem-specific Assessment & Plan notes found for this encounter.  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  Platelets today are 112- STABLE;  Asymptomatic.  Would not recommend treatment unless less than 50 or symptomatic    #Possible knee surgery [s/p cortisone shot; Dr.Bowers]. Currently on hold. Receiving steroid injections. Follow up with Ortho.   # Leukocytosis- wbc 12.2. anc  pending at time of visit. Clinically, asymptomatic.    # CAD/MI- [Aug 2019]- asprin; Effient ] [Dr.Parschoes/]-STABLE.    # DISPOSITION: 6 mo- lab (cbc, cmp, ldh, b12), Dr. - la  , NP 02/24/2022

## 2022-02-25 ENCOUNTER — Other Ambulatory Visit: Payer: Self-pay

## 2022-02-25 DIAGNOSIS — D696 Thrombocytopenia, unspecified: Secondary | ICD-10-CM

## 2022-04-18 ENCOUNTER — Other Ambulatory Visit: Payer: Self-pay | Admitting: Addiction Medicine

## 2022-04-18 DIAGNOSIS — Z1231 Encounter for screening mammogram for malignant neoplasm of breast: Secondary | ICD-10-CM

## 2022-05-21 ENCOUNTER — Ambulatory Visit
Admission: RE | Admit: 2022-05-21 | Discharge: 2022-05-21 | Disposition: A | Discharge status: Home / Self Care | Payer: Medicare Other | Source: Ambulatory Visit | Attending: Addiction Medicine | Admitting: Addiction Medicine

## 2022-05-21 DIAGNOSIS — Z1231 Encounter for screening mammogram for malignant neoplasm of breast: Secondary | ICD-10-CM | POA: Diagnosis present

## 2022-05-26 ENCOUNTER — Encounter: Payer: Self-pay | Admitting: Addiction Medicine

## 2022-05-27 ENCOUNTER — Encounter: Payer: Self-pay | Admitting: Nurse Practitioner

## 2022-06-02 ENCOUNTER — Other Ambulatory Visit: Payer: Self-pay | Admitting: Family Medicine

## 2022-06-02 DIAGNOSIS — N6489 Other specified disorders of breast: Secondary | ICD-10-CM

## 2022-06-02 DIAGNOSIS — N63 Unspecified lump in unspecified breast: Secondary | ICD-10-CM

## 2022-06-02 DIAGNOSIS — R928 Other abnormal and inconclusive findings on diagnostic imaging of breast: Secondary | ICD-10-CM

## 2022-06-02 DIAGNOSIS — R921 Mammographic calcification found on diagnostic imaging of breast: Secondary | ICD-10-CM

## 2022-06-17 ENCOUNTER — Ambulatory Visit
Admission: RE | Admit: 2022-06-17 | Discharge: 2022-06-17 | Disposition: A | Discharge status: Home / Self Care | Payer: Medicare Other | Source: Ambulatory Visit | Attending: Family Medicine | Admitting: Family Medicine

## 2022-06-17 DIAGNOSIS — R928 Other abnormal and inconclusive findings on diagnostic imaging of breast: Secondary | ICD-10-CM | POA: Diagnosis not present

## 2022-06-17 DIAGNOSIS — N63 Unspecified lump in unspecified breast: Secondary | ICD-10-CM | POA: Insufficient documentation

## 2022-06-17 DIAGNOSIS — R921 Mammographic calcification found on diagnostic imaging of breast: Secondary | ICD-10-CM | POA: Insufficient documentation

## 2022-06-17 DIAGNOSIS — N6489 Other specified disorders of breast: Secondary | ICD-10-CM | POA: Diagnosis present

## 2022-07-08 ENCOUNTER — Other Ambulatory Visit: Payer: Self-pay | Admitting: Nurse Practitioner

## 2022-07-08 DIAGNOSIS — R928 Other abnormal and inconclusive findings on diagnostic imaging of breast: Secondary | ICD-10-CM

## 2022-07-08 DIAGNOSIS — N6489 Other specified disorders of breast: Secondary | ICD-10-CM

## 2022-07-15 ENCOUNTER — Ambulatory Visit
Admission: RE | Admit: 2022-07-15 | Discharge: 2022-07-15 | Disposition: A | Discharge status: Home / Self Care | Payer: 59 | Source: Ambulatory Visit | Attending: Nurse Practitioner | Admitting: Nurse Practitioner

## 2022-07-15 DIAGNOSIS — R928 Other abnormal and inconclusive findings on diagnostic imaging of breast: Secondary | ICD-10-CM

## 2022-07-15 DIAGNOSIS — N6489 Other specified disorders of breast: Secondary | ICD-10-CM | POA: Diagnosis present

## 2022-07-15 HISTORY — PX: BREAST BIOPSY: SHX20

## 2022-07-15 MED ORDER — LIDOCAINE HCL (PF) 2 % IJ SOLN
5.0000 mL | Freq: Once | INTRAMUSCULAR | Status: AC
  Administered 2022-07-15: 5 mL
  Filled 2022-07-15: qty 5

## 2022-07-15 MED ORDER — LIDOCAINE-EPINEPHRINE 1 %-1:100000 IJ SOLN
10.0000 mL | Freq: Once | INTRAMUSCULAR | Status: AC
  Administered 2022-07-15: 10 mL
  Filled 2022-07-15: qty 10

## 2022-07-16 LAB — SURGICAL PATHOLOGY

## 2022-08-27 ENCOUNTER — Inpatient Hospital Stay (HOSPITAL_BASED_OUTPATIENT_CLINIC_OR_DEPARTMENT_OTHER): Payer: 59 | Admitting: Internal Medicine

## 2022-08-27 ENCOUNTER — Inpatient Hospital Stay: Payer: 59 | Attending: Internal Medicine

## 2022-08-27 ENCOUNTER — Encounter: Payer: Self-pay | Admitting: Internal Medicine

## 2022-08-27 VITALS — BP 140/55 | HR 66 | Temp 97.6°F | Resp 16 | Wt 268.8 lb

## 2022-08-27 DIAGNOSIS — Z7982 Long term (current) use of aspirin: Secondary | ICD-10-CM | POA: Diagnosis not present

## 2022-08-27 DIAGNOSIS — D696 Thrombocytopenia, unspecified: Secondary | ICD-10-CM | POA: Diagnosis present

## 2022-08-27 DIAGNOSIS — Z79899 Other long term (current) drug therapy: Secondary | ICD-10-CM | POA: Insufficient documentation

## 2022-08-27 DIAGNOSIS — I2510 Atherosclerotic heart disease of native coronary artery without angina pectoris: Secondary | ICD-10-CM | POA: Insufficient documentation

## 2022-08-27 DIAGNOSIS — I252 Old myocardial infarction: Secondary | ICD-10-CM | POA: Diagnosis not present

## 2022-08-27 DIAGNOSIS — F17210 Nicotine dependence, cigarettes, uncomplicated: Secondary | ICD-10-CM | POA: Diagnosis not present

## 2022-08-27 LAB — CBC
HCT: 43.7 % (ref 36.0–46.0)
Hemoglobin: 14 g/dL (ref 12.0–15.0)
MCH: 27.6 pg (ref 26.0–34.0)
MCHC: 32 g/dL (ref 30.0–36.0)
MCV: 86.2 fL (ref 80.0–100.0)
Platelets: 124 10*3/uL — ABNORMAL LOW (ref 150–400)
RBC: 5.07 MIL/uL (ref 3.87–5.11)
RDW: 14.5 % (ref 11.5–15.5)
WBC: 12 10*3/uL — ABNORMAL HIGH (ref 4.0–10.5)
nRBC: 0 % (ref 0.0–0.2)

## 2022-08-27 LAB — COMPREHENSIVE METABOLIC PANEL
ALT: 23 U/L (ref 0–44)
AST: 28 U/L (ref 15–41)
Albumin: 4 g/dL (ref 3.5–5.0)
Alkaline Phosphatase: 81 U/L (ref 38–126)
Anion gap: 9 (ref 5–15)
BUN: 13 mg/dL (ref 8–23)
CO2: 27 mmol/L (ref 22–32)
Calcium: 9 mg/dL (ref 8.9–10.3)
Chloride: 101 mmol/L (ref 98–111)
Creatinine, Ser: 0.82 mg/dL (ref 0.44–1.00)
GFR, Estimated: >60 mL/min (ref >60)
Glucose, Bld: 120 mg/dL — ABNORMAL HIGH (ref 70–99)
Potassium: 3.5 mmol/L (ref 3.5–5.1)
Sodium: 137 mmol/L (ref 135–145)
Total Bilirubin: 0.7 mg/dL (ref 0.3–1.2)
Total Protein: 7.8 g/dL (ref 6.5–8.1)

## 2022-08-27 LAB — LACTATE DEHYDROGENASE: LDH: 126 U/L (ref 98–192)

## 2022-08-27 LAB — VITAMIN B12: Vitamin B-12: 357 pg/mL (ref 180–914)

## 2022-08-27 NOTE — Progress Notes (Signed)
Pt in for follow up, states she is doing well and is excited about moving into her own apartment.

## 2022-08-27 NOTE — Assessment & Plan Note (Addendum)
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  Platelets today are 124 - stable. Asymptomatic.  Would not recommend treatment unless less than 50 or symptomatic./See below  #Possible knee surgery [s/p cortisone shot; Dr.Bowers]; recommend in follow-up as if she needed hematology clearance; try to lose weight.  # CAD/MI- [Aug 2019]- asprin; Effient Britannia.Carbine ] [Dr.Parschoes/]-stable  # DISPOSITION: # follow up in 6 month-MD: labs- cbc/cmp;LDH/b12 levels- Dr.B

## 2022-08-27 NOTE — Progress Notes (Signed)
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  Karen Potts 61 y.o.  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   No abnormal bleeding or bruising. No blood in stool.    Review of Systems  Constitutional:  Positive for malaise/fatigue. Negative for chills, fever and weight loss.  HENT:  Negative for hearing loss, nosebleeds, sore throat and tinnitus.   Eyes:  Negative for blurred vision and double vision.  Respiratory:  Negative for cough, hemoptysis, shortness of breath and wheezing.   Cardiovascular:  Negative for chest pain, palpitations and leg swelling.  Gastrointestinal:  Negative for abdominal pain, blood in stool, constipation, diarrhea, melena, nausea and vomiting.  Genitourinary:  Negative for dysuria and urgency.  Musculoskeletal:  Positive for back pain and joint pain. Negative for falls and myalgias.  Skin:  Negative for itching and rash.  Neurological:  Negative for dizziness, tingling, sensory change, loss of consciousness, weakness and headaches.  Endo/Heme/Allergies:  Negative for environmental allergies. Does not bruise/bleed easily.  Psychiatric/Behavioral:  Negative for depression. The patient is not nervous/anxious and does not have insomnia.      MEDICAL HISTORY:  Past Medical History:  Diagnosis Date   Bulging lumbar disc 2022    Diabetes mellitus without complication ([place])    Hypertension    MI, old 2019   Sleep apnea    Vertigo     SURGICAL HISTORY: Past Surgical History:  Procedure Laterality Date   ABDOMINAL HYSTERECTOMY     BREAST BIOPSY Left 10/2017    APOCRINE TYPE CYST WITH FIBROSIS Of the Wall   BREAST BIOPSY Left 07/15/2022   Stereo Bx, X Clip, Path Pending   BREAST BIOPSY Left 07/15/2022   MM LT BREAST BX W LOC DEV 1ST LESION IMAGE BX SPEC STEREO GUIDE 07/15/2022 [facility]   CORONARY STENT INTERVENTION N/A 02/24/2018   Procedure: CORONARY STENT INTERVENTION;  Surgeon: Isaias Cowman, MD;  Location: [facility];  Service: Cardiovascular;  Laterality: N/A;   LEFT HEART CATH AND CORONARY ANGIOGRAPHY Right 02/24/2018   Procedure: LEFT HEART CATH AND CORONARY ANGIOGRAPHY;  Surgeon: Isaias Cowman, MD;  Location: [facility];  Service: Cardiovascular;  Laterality: Right;   SHOULDER ARTHROSCOPY WITH OPEN ROTATOR CUFF REPAIR Left 08/14/2017   Procedure: SHOULDER ARTHROSCOPY WITH ROTATOR CUFF REPAIR, BICEPS TENOTOMY, SUBACROMIAL DECOMPRESSION;  Surgeon: Lovell Sheehan, MD;  Location: [facility];  Service: Orthopedics;  Laterality: Left;   UMBILICAL HERNIA REPAIR      SOCIAL HISTORY: Social History   Socioeconomic History   Marital status: Married    Spouse name: Not on file   Number of children: Not on file   Years of education: Not on file   Highest education level: Not on file  Occupational History   Not on file  Tobacco Use   Smoking status: Some Days    Types: Cigarettes    Last attempt to quit: 02/24/2018    Years since quitting: 4.6   Smokeless tobacco: Never  Tobacco comments:    Carneshia said she quit smoking the day of her heart attack.  Vaping Use   Vaping Use: Never used  Substance and Sexual Activity   Alcohol use: No   Drug use: No   Sexual activity: Yes    Birth control/protection: Condom  Other Topics Concern   Not on file  Social History  Narrative   Not working/disability; smoking; no alcohol. In [place]/ lives with a friend.    Social Determinants of Health   Financial Resource Strain: Not on file  Food Insecurity: Not on file  Transportation Needs: Not on file  Physical Activity: Not on file  Stress: Not on file  Social Connections: Not on file  Intimate Partner Violence: Not on file    FAMILY HISTORY: Family History  Problem Relation Age of Onset   Breast cancer Neg Hx     ALLERGIES:  has No Known Allergies.  MEDICATIONS:  Current Outpatient Medications  Medication Sig Dispense Refill   aspirin EC 81 MG tablet Take 81 mg by mouth daily.     atorvastatin (LIPITOR) 80 MG tablet Take 1 tablet (80 mg total) by mouth daily at 6 PM. 30 tablet 0   lisinopril-hydrochlorothiazide (PRINZIDE,ZESTORETIC) 10-12.5 MG tablet TAKE 1 TABLET BY MOUTH ONCE DAILY 90 tablet 1   meclizine (ANTIVERT) 12.5 MG tablet Take 1 tablet (12.5 mg total) by mouth 3 (three) times daily as needed for dizziness or nausea. 30 tablet 1   pregabalin (LYRICA) 25 MG capsule Take by mouth.     cyclobenzaprine (FLEXERIL) 10 MG tablet Take 10 mg by mouth 2 (two) times daily as needed. (Patient not taking: Reported on 12/15/2021)     loratadine (CLARITIN) 10 MG tablet Take by mouth. (Patient not taking: Reported on 02/25/2021)     metFORMIN (GLUCOPHAGE) 500 MG tablet Take 500 mg by mouth daily with breakfast. (Patient not taking: Reported on 02/24/2022)     nitroGLYCERIN (NITROSTAT) 0.4 MG SL tablet Place 1 tablet (0.4 mg total) under the tongue every 5 (five) minutes as needed for chest pain. (Patient not taking: Reported on 08/20/2020) 30 tablet 12   ondansetron (ZOFRAN ODT) 4 MG disintegrating tablet Take 1 tablet (4 mg total) by mouth every 8 (eight) hours as needed. (Patient not taking: Reported on 02/25/2021) 20 tablet 0   triamcinolone cream (KENALOG) 0.1 % Apply topically 2 (two) times daily.     No current facility-administered medications for  this visit.     Marland Kitchen  PHYSICAL EXAMINATION:   Vitals:   08/27/22 1540  BP: (!) 140/55  Pulse: 66  Resp: 16  Temp: 97.6 F (36.4 C)  SpO2: 99%   Filed Weights   08/27/22 1540  Weight: 268 lb 12.8 oz (121.9 kg)    Physical Exam HENT:     Head: Normocephalic and atraumatic.     Mouth/Throat:     Pharynx: No oropharyngeal exudate.  Eyes:     Pupils: Pupils are equal, round, and reactive to light.  Cardiovascular:     Rate and Rhythm: Normal rate and regular rhythm.  Pulmonary:     Effort: No respiratory distress.     Breath sounds: No wheezing.  Abdominal:     General: Bowel sounds are normal. There is no distension.     Palpations: Abdomen is soft. There is no mass.     Tenderness: There is no abdominal tenderness. There is no guarding or rebound.  Musculoskeletal:        General: No  tenderness. Normal range of motion.     Cervical back: Normal range of motion and neck supple.  Skin:    General: Skin is warm.  Neurological:     Mental Status: She is alert and oriented to person, place, and time.  Psychiatric:        Mood and Affect: Affect normal.      LABORATORY DATA:  I have reviewed the data as listed Lab Results  Component Value Date   WBC 12.0 (H) 08/27/2022   HGB 14.0 08/27/2022   HCT 43.7 08/27/2022   MCV 86.2 08/27/2022   PLT 124 (L) 08/27/2022   Recent Labs    12/15/21 0206 02/24/22 1409 08/27/22 1507  NA 141 138 137  K 3.2* 3.4* 3.5  CL 107 106 101  CO2 27 25 27   GLUCOSE 120* 122* 120*  BUN 14 16 13   CREATININE 0.66 0.74 0.82  CALCIUM 9.4 8.9 9.0  GFRNONAA >60 >60 >60  PROT 7.9 7.3 7.8  ALBUMIN 4.1 3.8 4.0  AST 26 24 28   ALT 27 27 23   ALKPHOS 74 69 81  BILITOT 0.7 0.5 0.7     No results found.  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  Platelets today are 124 - stable. Asymptomatic.  Would not recommend treatment unless less than 50 or symptomatic./See  below  #Possible knee surgery [s/p cortisone shot; Dr.Bowers]; recommend in follow-up as if she needed hematology clearance; try to lose weight.  # CAD/MI- [Aug 2019]- asprin; Effient [place].Carbine ] [Dr.Parschoes/]-stable  # DISPOSITION: # follow up in 6 month-MD: labs- cbc/cmp;LDH/b12 levels- Dr.B   Cammie Sickle, MD 09/30/2022

## 2022-09-24 ENCOUNTER — Other Ambulatory Visit: Payer: Self-pay

## 2022-09-24 ENCOUNTER — Telehealth: Payer: Self-pay

## 2022-09-24 DIAGNOSIS — Z1211 Encounter for screening for malignant neoplasm of colon: Secondary | ICD-10-CM

## 2022-09-24 MED ORDER — NA SULFATE-K SULFATE-MG SULF 17.5-3.13-1.6 GM/177ML PO SOLN: 1.0000 | Freq: Once | ORAL | 0 refills | Status: AC

## 2022-09-24 NOTE — Telephone Encounter (Signed)
Gastroenterology Pre-Procedure Review  Request Date: 10/20/22 Requesting Physician: Dr. Vicente Males  PATIENT REVIEW QUESTIONS: The patient responded to the following health history questions as indicated:    1. Are you having any GI issues? no 2. Do you have a personal history of Polyps? no 3. Do you have a family history of Colon Cancer or Polyps? no 4. Diabetes Mellitus? yes (was taken metformin but has stopped due to side effects) 5. Joint replacements in the past 12 months?no 6. Major health problems in the past 3 months?no 7. Any artificial heart valves, MVP, or defibrillator? patient  has cardiac history. Care received by dr. Saralyn Pilar.  Cardiac clearance sent to his office    MEDICATIONS & ALLERGIES:    Patient reports the following regarding taking any anticoagulation/antiplatelet therapy:   Plavix, Coumadin, Eliquis, Xarelto, Lovenox, Pradaxa, Brilinta, or Effient? no Aspirin? yes (81 MG DAILY)  Patient confirms/reports the following medications:  Current Outpatient Medications  Medication Sig Dispense Refill   aspirin EC 81 MG tablet Take 81 mg by mouth daily.     atorvastatin (LIPITOR) 80 MG tablet Take 1 tablet (80 mg total) by mouth daily at 6 PM. 30 tablet 0   cyclobenzaprine (FLEXERIL) 10 MG tablet Take 10 mg by mouth 2 (two) times daily as needed. (Patient not taking: Reported on 12/15/2021)     lisinopril-hydrochlorothiazide (PRINZIDE,ZESTORETIC) 10-12.5 MG tablet TAKE 1 TABLET BY MOUTH ONCE DAILY 90 tablet 1   loratadine (CLARITIN) 10 MG tablet Take by mouth. (Patient not taking: Reported on 02/25/2021)     meclizine (ANTIVERT) 12.5 MG tablet Take 1 tablet (12.5 mg total) by mouth 3 (three) times daily as needed for dizziness or nausea. 30 tablet 1   metFORMIN (GLUCOPHAGE) 500 MG tablet Take 500 mg by mouth daily with breakfast. (Patient not taking: Reported on 02/24/2022)     nitroGLYCERIN (NITROSTAT) 0.4 MG SL tablet Place 1 tablet (0.4 mg total) under the tongue every 5  (five) minutes as needed for chest pain. (Patient not taking: Reported on 08/20/2020) 30 tablet 12   ondansetron (ZOFRAN ODT) 4 MG disintegrating tablet Take 1 tablet (4 mg total) by mouth every 8 (eight) hours as needed. (Patient not taking: Reported on 02/25/2021) 20 tablet 0   pregabalin (LYRICA) 25 MG capsule Take by mouth.     No current facility-administered medications for this visit.    Patient confirms/reports the following allergies:  No Known Allergies  No orders of the defined types were placed in this encounter.   AUTHORIZATION INFORMATION Primary Insurance: 1D#: Group #:  Secondary Insurance: 1D#: Group #:  SCHEDULE INFORMATION: Date: 10/20/22 Time: Location: [facility]

## 2022-09-29 ENCOUNTER — Telehealth: Payer: Self-pay

## 2022-09-29 NOTE — Telephone Encounter (Signed)
Spoke with pt and [employer] on 3 way. Per [facility] MD line  pt insurance will expire on 09-28-22. Call back on member line [employer] states insurcance will expire on 06-30-23. I could not get it approved online before 09-28-22. On 4-*1-24 [facility] website states no member found. We call and was was assured no prior auth was required.Refer. number [id number]

## 2022-09-29 NOTE — Telephone Encounter (Signed)
Patient has been cleared for her scheduled colonoscopy on 10/20/22 by Dr. Josefa Half.  Clearance received by fax on 09/24/22.  Thanks, [place], [place]

## 2022-09-30 ENCOUNTER — Other Ambulatory Visit: Payer: Self-pay | Admitting: Nurse Practitioner

## 2022-09-30 DIAGNOSIS — Z1231 Encounter for screening mammogram for malignant neoplasm of breast: Secondary | ICD-10-CM

## 2022-10-13 ENCOUNTER — Encounter: Payer: Self-pay | Admitting: Gastroenterology

## 2022-10-17 ENCOUNTER — Encounter: Payer: Self-pay | Admitting: Gastroenterology

## 2022-10-20 ENCOUNTER — Encounter: Payer: Self-pay | Admitting: Gastroenterology

## 2022-10-20 ENCOUNTER — Ambulatory Visit: Payer: 59 | Admitting: General Practice

## 2022-10-20 ENCOUNTER — Ambulatory Visit
Admission: RE | Admit: 2022-10-20 | Discharge: 2022-10-20 | Disposition: A | Discharge status: Home / Self Care | Payer: 59 | Attending: Gastroenterology | Admitting: Gastroenterology

## 2022-10-20 ENCOUNTER — Encounter
Admission: RE | Disposition: A | Discharge status: Home / Self Care | Payer: Self-pay | Source: Home / Self Care | Attending: Gastroenterology

## 2022-10-20 DIAGNOSIS — Z7984 Long term (current) use of oral hypoglycemic drugs: Secondary | ICD-10-CM | POA: Insufficient documentation

## 2022-10-20 DIAGNOSIS — D126 Benign neoplasm of colon, unspecified: Secondary | ICD-10-CM

## 2022-10-20 DIAGNOSIS — D122 Benign neoplasm of ascending colon: Secondary | ICD-10-CM | POA: Diagnosis not present

## 2022-10-20 DIAGNOSIS — D128 Benign neoplasm of rectum: Secondary | ICD-10-CM | POA: Insufficient documentation

## 2022-10-20 DIAGNOSIS — I252 Old myocardial infarction: Secondary | ICD-10-CM | POA: Insufficient documentation

## 2022-10-20 DIAGNOSIS — E119 Type 2 diabetes mellitus without complications: Secondary | ICD-10-CM | POA: Diagnosis not present

## 2022-10-20 DIAGNOSIS — F17210 Nicotine dependence, cigarettes, uncomplicated: Secondary | ICD-10-CM | POA: Diagnosis not present

## 2022-10-20 DIAGNOSIS — Z1211 Encounter for screening for malignant neoplasm of colon: Secondary | ICD-10-CM

## 2022-10-20 DIAGNOSIS — I10 Essential (primary) hypertension: Secondary | ICD-10-CM | POA: Diagnosis not present

## 2022-10-20 HISTORY — PX: COLONOSCOPY WITH PROPOFOL: SHX5780

## 2022-10-20 LAB — GLUCOSE, CAPILLARY: Glucose-Capillary: 79 mg/dL (ref 70–99)

## 2022-10-20 SURGERY — COLONOSCOPY WITH PROPOFOL
Anesthesia: General

## 2022-10-20 MED ORDER — PHENYLEPHRINE HCL (PRESSORS) 10 MG/ML IV SOLN
INTRAVENOUS | Status: DC | PRN
  Administered 2022-10-20: 160 ug via INTRAVENOUS

## 2022-10-20 MED ORDER — LIDOCAINE HCL (CARDIAC) PF 100 MG/5ML IV SOSY
PREFILLED_SYRINGE | INTRAVENOUS | Status: DC | PRN
  Administered 2022-10-20: 40 mg via INTRAVENOUS

## 2022-10-20 MED ORDER — PROPOFOL 10 MG/ML IV BOLUS
INTRAVENOUS | Status: DC | PRN
  Administered 2022-10-20: 90 mg via INTRAVENOUS

## 2022-10-20 MED ORDER — PROPOFOL 1000 MG/100ML IV EMUL
INTRAVENOUS | Status: AC
  Filled 2022-10-20: qty 100

## 2022-10-20 MED ORDER — SODIUM CHLORIDE 0.9 % IV SOLN: INTRAVENOUS | Status: DC

## 2022-10-20 MED ORDER — PROPOFOL 500 MG/50ML IV EMUL
INTRAVENOUS | Status: DC | PRN
  Administered 2022-10-20: 150 ug/kg/min via INTRAVENOUS

## 2022-10-20 NOTE — Anesthesia Postprocedure Evaluation (Signed)
Anesthesia Post Note  Patient: Programmer, systems  Procedure(s) Performed: COLONOSCOPY WITH PROPOFOL  Patient location during evaluation: Endoscopy Anesthesia Type: General Level of consciousness: awake and alert Pain management: pain level controlled Vital Signs Assessment: post-procedure vital signs reviewed and stable Respiratory status: spontaneous breathing, nonlabored ventilation, respiratory function stable and patient connected to nasal cannula oxygen Cardiovascular status: blood pressure returned to baseline and stable Postop Assessment: no apparent nausea or vomiting Anesthetic complications: no  No notable events documented.   Last Vitals:  Vitals:   10/20/22 0858 10/20/22 0908  BP: (!) 105/41 (!) 102/41  Pulse: 62 60  Resp: 13 15  Temp:    SpO2: 100% 100%    Last Pain:  Vitals:   10/20/22 0908  TempSrc:   PainSc: 0-No pain                 Stephanie Coup

## 2022-10-20 NOTE — Transfer of Care (Signed)
Immediate Anesthesia Transfer of Care Note  Patient: Karen Potts  Procedure(s) Performed: Procedure(s): COLONOSCOPY WITH PROPOFOL (N/A)  Patient Location: PACU and Endoscopy Unit  Anesthesia Type:General  Level of Consciousness: sedated  Airway & Oxygen Therapy: Patient Spontanous Breathing and Patient connected to nasal cannula oxygen  Post-op Assessment: Report given to RN and Post -op Vital signs reviewed and stable  Post vital signs: Reviewed and stable  Last Vitals:  Vitals:   10/20/22 0737 10/20/22 0848  BP: (!) 158/60 (!) 116/35  Pulse: 64 (!) 54  Resp: 18 12  Temp: (!) 35.8 C (!) 35.8 C  SpO2: 100% 100%    Complications: No apparent anesthesia complications

## 2022-10-20 NOTE — H&P (Signed)
Wyline Mood, MD [address], [place], [place], [address] [address], [place], [place], [address] Phone: [phone]  Fax: [phone]  Primary Care Physician:  [facility]   Pre-Procedure History & Physical: HPI:  Karen Potts is a 62 y.o. female is here for an colonoscopy.   Past Medical History:  Diagnosis Date   Bulging lumbar disc 2022   Diabetes mellitus without complication    Hypertension    MI, old 2019   Sleep apnea    Vertigo     Past Surgical History:  Procedure Laterality Date   ABDOMINAL HYSTERECTOMY     BREAST BIOPSY Left 10/2017    APOCRINE TYPE CYST WITH FIBROSIS Of the Wall   BREAST BIOPSY Left 07/15/2022   Stereo Bx, X Clip, Path Pending   BREAST BIOPSY Left 07/15/2022   MM LT BREAST BX W LOC DEV 1ST LESION IMAGE BX SPEC STEREO GUIDE 07/15/2022 [facility]   CORONARY STENT INTERVENTION N/A 02/24/2018   Procedure: CORONARY STENT INTERVENTION;  Surgeon: Marcina Millard, MD;  Location: [facility];  Service: Cardiovascular;  Laterality: N/A;   HERNIA REPAIR     LEFT HEART CATH AND CORONARY ANGIOGRAPHY Right 02/24/2018   Procedure: LEFT HEART CATH AND CORONARY ANGIOGRAPHY;  Surgeon: Marcina Millard, MD;  Location: [facility];  Service: Cardiovascular;  Laterality: Right;   SHOULDER ARTHROSCOPY WITH OPEN ROTATOR CUFF REPAIR Left 08/14/2017   Procedure: SHOULDER ARTHROSCOPY WITH ROTATOR CUFF REPAIR, BICEPS TENOTOMY, SUBACROMIAL DECOMPRESSION;  Surgeon: Lyndle Herrlich, MD;  Location: [facility];  Service: Orthopedics;  Laterality: Left;   UMBILICAL HERNIA REPAIR      Prior to Admission medications   Medication Sig Start Date End Date Taking? Authorizing Provider  aspirin EC 81 MG tablet Take 81 mg by mouth daily.   Yes [provider]  atorvastatin (LIPITOR) 80 MG tablet Take 1 tablet (80 mg total) by mouth daily at 6 PM. 02/26/18 04/07/28 Yes Pyreddy, Vivien Rota, MD   lisinopril-hydrochlorothiazide (PRINZIDE,ZESTORETIC) 10-12.5 MG tablet TAKE 1 TABLET BY MOUTH ONCE DAILY 05/01/17  Yes Chrismon, Jodell Cipro, PA-C  meclizine (ANTIVERT) 12.5 MG tablet Take 1 tablet (12.5 mg total) by mouth 3 (three) times daily as needed for dizziness or nausea. 12/13/17  Yes Sharyn Creamer, MD  cyclobenzaprine (FLEXERIL) 10 MG tablet Take 10 mg by mouth 2 (two) times daily as needed. Patient not taking: Reported on 12/15/2021 10/08/21   [provider]  loratadine (CLARITIN) 10 MG tablet Take by mouth. Patient not taking: Reported on 02/25/2021 06/10/18   [provider]  metFORMIN (GLUCOPHAGE) 500 MG tablet Take 500 mg by mouth daily with breakfast. Patient not taking: Reported on 02/24/2022    [provider]  nitroGLYCERIN (NITROSTAT) 0.4 MG SL tablet Place 1 tablet (0.4 mg total) under the tongue every 5 (five) minutes as needed for chest pain. Patient not taking: Reported on 08/20/2020 02/26/18   Ihor Austin, MD  ondansetron (ZOFRAN ODT) 4 MG disintegrating tablet Take 1 tablet (4 mg total) by mouth every 8 (eight) hours as needed. Patient not taking: Reported on 02/25/2021 04/23/20   Nita Sickle, MD  pregabalin (LYRICA) 25 MG capsule Take by mouth. Patient not taking: Reported on 10/13/2022 12/04/21   [provider]  triamcinolone cream (KENALOG) 0.1 % Apply topically 2 (two) times daily. 09/22/22   [provider]    Allergies as of 09/25/2022   (No Known Allergies)    Family  History  Problem Relation Age of Onset   Breast cancer Neg Hx     Social History   Socioeconomic History   Marital status: Married    Spouse name: Not on file   Number of children: Not on file   Years of education: Not on file   Highest education level: Not on file  Occupational History   Not on file  Tobacco Use   Smoking status: Some Days    Types: Cigarettes    Last attempt to quit: 02/24/2018    Years since quitting: 4.6   Smokeless  tobacco: Never   Tobacco comments:    Momina said she quit smoking the day of her heart attack.  Vaping Use   Vaping Use: Never used  Substance and Sexual Activity   Alcohol use: No   Drug use: No   Sexual activity: Yes    Birth control/protection: Condom  Other Topics Concern   Not on file  Social History Narrative   Not working/disability; smoking; no alcohol. In [place]/ lives with a friend.    Social Determinants of Health   Financial Resource Strain: Not on file  Food Insecurity: Not on file  Transportation Needs: Not on file  Physical Activity: Not on file  Stress: Not on file  Social Connections: Not on file  Intimate Partner Violence: Not on file    Review of Systems: See HPI, otherwise negative ROS  Physical Exam: BP (!) 158/60   Pulse 64   Temp (!) 96.5 F (35.8 C)   Resp 18   Ht  (1.702 m)   Wt 120.7 kg   SpO2 100%   BMI 41.69 kg/m  General:   Alert,  pleasant and cooperative in NAD Head:  Normocephalic and atraumatic. Neck:  Supple; no masses or thyromegaly. Lungs:  Clear throughout to auscultation, normal respiratory effort.    Heart:  +S1, +S2, Regular rate and rhythm, No edema. Abdomen:  Soft, nontender and nondistended. Normal bowel sounds, without guarding, and without rebound.   Neurologic:  Alert and  oriented x4;  grossly normal neurologically.  Impression/Plan: Karen Potts is here for an colonoscopy to be performed for Screening colonoscopy average risk   Risks, benefits, limitations, and alternatives regarding  colonoscopy have been reviewed with the patient.  Questions have been answered.  All parties agreeable.   Wyline Mood, MD  10/20/2022, 7:51 AM

## 2022-10-20 NOTE — Anesthesia Preprocedure Evaluation (Addendum)
Anesthesia Evaluation  Patient identified by MRN, date of birth, ID band Patient awake    Reviewed: Allergy & Precautions, NPO status , Patient's Chart, lab work & pertinent test results  Airway Mallampati: III  TM Distance: >3 FB Neck ROM: full    Dental  (+) Chipped, Dental Advidsory Given   Pulmonary sleep apnea , neg COPD, Current Smoker   Pulmonary exam normal        Cardiovascular hypertension, (-) angina + CAD, + Past MI and + Cardiac Stents  Normal cardiovascular exam     Neuro/Psych negative neurological ROS  negative psych ROS   GI/Hepatic negative GI ROS, Neg liver ROS,,,  Endo/Other  negative endocrine ROSdiabetes    Renal/GU negative Renal ROS  negative genitourinary   Musculoskeletal   Abdominal   Peds  Hematology negative hematology ROS (+)   Anesthesia Other Findings Past Medical History: 2022: Bulging lumbar disc No date: Diabetes mellitus without complication No date: Hypertension 2019: MI, old No date: Sleep apnea No date: Vertigo  Past Surgical History: No date: ABDOMINAL HYSTERECTOMY 10/2017: BREAST BIOPSY; Left     Comment:   APOCRINE TYPE CYST WITH FIBROSIS Of the Wall 07/15/2022: BREAST BIOPSY; Left     Comment:  Stereo Bx, X Clip, Path Pending 07/15/2022: BREAST BIOPSY; Left     Comment:  MM LT BREAST BX W LOC DEV 1ST LESION IMAGE BX SPEC               STEREO GUIDE 07/15/2022 [facility] 02/24/2018: CORONARY STENT INTERVENTION; N/A     Comment:  Procedure: CORONARY STENT INTERVENTION;  Surgeon:               Marcina Millard, MD;  Location: [facility];  Service: Cardiovascular;  Laterality: N/A; No date: HERNIA REPAIR 02/24/2018: LEFT HEART CATH AND CORONARY ANGIOGRAPHY; Right     Comment:  Procedure: LEFT HEART CATH AND CORONARY ANGIOGRAPHY;                Surgeon: Marcina Millard, MD;  Location: [facility];  Service:  Cardiovascular;  Laterality:               Right; 08/14/2017: SHOULDER ARTHROSCOPY WITH OPEN ROTATOR CUFF REPAIR; Left     Comment:  Procedure: SHOULDER ARTHROSCOPY WITH ROTATOR CUFF               REPAIR, BICEPS TENOTOMY, SUBACROMIAL DECOMPRESSION;                Surgeon: Lyndle Herrlich, MD;  Location: [facility];                Service: Orthopedics;  Laterality: Left; No date: UMBILICAL HERNIA REPAIR  BMI    Body Mass Index: 41.69 kg/m      Reproductive/Obstetrics negative OB ROS                             Anesthesia Physical Anesthesia Plan  ASA: 3  Anesthesia Plan: General   Post-op Pain Management: Minimal or no pain anticipated   Induction: Intravenous  PONV Risk Score and Plan: 3 and Propofol infusion, TIVA and Ondansetron  Airway Management Planned: Nasal Cannula  Additional Equipment: None  Intra-op Plan:   Post-operative Plan:   Informed Consent: I  have reviewed the patients History and Physical, chart, labs and discussed the procedure including the risks, benefits and alternatives for the proposed anesthesia with the patient or authorized representative who has indicated his/her understanding and acceptance.     Dental advisory given  Plan Discussed with: CRNA and Surgeon  Anesthesia Plan Comments: (Discussed risks of anesthesia with patient, including possibility of difficulty with spontaneous ventilation under anesthesia necessitating airway intervention, PONV, and rare risks such as cardiac or respiratory or neurological events, and allergic reactions. Discussed the role of CRNA in patient's perioperative care. Patient understands.)       Anesthesia Quick Evaluation

## 2022-10-20 NOTE — Op Note (Signed)
[facility] Patient Name: Karen Potts Procedure Date: 10/20/2022 8:19 AM MRN: [id number] Account #: [id number] Date of Birth: 09/20/1960 Admit Type: Outpatient Age: 62 Room: [facility] ENDO ROOM 1 Gender: Female Note Status: Finalized Instrument Name: Prentice Docker [id number] Procedure:             Colonoscopy Indications:           Screening for colorectal malignant neoplasm Providers:             Wyline Mood MD, MD Referring MD:          No Local Md, MD (Referring MD) Medicines:             Monitored Anesthesia Care Complications:         No immediate complications. Procedure:             Pre-Anesthesia Assessment:                        - Prior to the procedure, a History and Physical was                         performed, and patient medications, allergies and                         sensitivities were reviewed. The patient's tolerance                         of previous anesthesia was reviewed.                        - The risks and benefits of the procedure and the                         sedation options and risks were discussed with the                         patient. All questions were answered and informed                         consent was obtained.                        - ASA Grade Assessment: II - A patient with mild                         systemic disease.                        After obtaining informed consent, the colonoscope was                         passed under direct vision. Throughout the procedure,                         the patient's blood pressure, pulse, and oxygen                         saturations were monitored continuously. The                         Colonoscope was  introduced through the anus and                         advanced to the the cecum, identified by the                         appendiceal orifice. The colonoscopy was performed                         with ease. The patient tolerated the procedure well.                          The quality of the bowel preparation was excellent.                         The ileocecal valve, appendiceal orifice, and rectum                         were photographed. Findings:      The perianal and digital rectal examinations were normal.      Two sessile polyps were found in the sigmoid colon and ascending colon.       The polyps were 5 to 6 mm in size. These polyps were removed with a cold       snare. Resection and retrieval were complete.      The exam was otherwise without abnormality on direct and retroflexion       views. Impression:            - Two 5 to 6 mm polyps in the rectum and in the                         ascending colon, removed with a cold snare. Resected                         and retrieved.                        - The examination was otherwise normal on direct and                         retroflexion views. Recommendation:        - Discharge patient to home (with escort).                        - Resume previous diet.                        - Continue present medications.                        - Await pathology results.                        - Repeat colonoscopy for surveillance based on                         pathology results. Procedure Code(s):     --- Professional ---  16109, Colonoscopy, flexible; with removal of                         tumor(s), polyp(s), or other lesion(s) by snare                         technique Diagnosis Code(s):     --- Professional ---                        Z12.11, Encounter for screening for malignant neoplasm                         of colon                        D12.8, Benign neoplasm of rectum                        D12.2, Benign neoplasm of ascending colon CPT copyright 2022 American Medical Association. All rights reserved. The codes documented in this report are preliminary and upon coder review may  be revised to meet current compliance requirements. Wyline Mood,  MD Wyline Mood MD, MD 10/20/2022 8:46:22 AM This report has been signed electronically. Number of Addenda: 0 Note Initiated On: 10/20/2022 8:19 AM Scope Withdrawal Time: 0 hours 11 minutes 2 seconds  Total Procedure Duration: 0 hours 14 minutes 26 seconds  Estimated Blood Loss:  Estimated blood loss: none.      [facility]

## 2022-10-21 LAB — SURGICAL PATHOLOGY

## 2022-10-22 ENCOUNTER — Encounter: Payer: Self-pay | Admitting: Gastroenterology

## 2023-02-16 ENCOUNTER — Ambulatory Visit (INDEPENDENT_AMBULATORY_CARE_PROVIDER_SITE_OTHER): Payer: 59 | Admitting: Internal Medicine

## 2023-02-16 VITALS — BP 132/74 | HR 72 | Resp 16 | Ht 67.0 in | Wt 266.0 lb

## 2023-02-16 DIAGNOSIS — G4733 Obstructive sleep apnea (adult) (pediatric): Secondary | ICD-10-CM | POA: Diagnosis not present

## 2023-02-16 NOTE — Progress Notes (Signed)
Sleep Medicine   Office Visit  Patient Name: Karen Potts DOB: Mar 07, 1961 MRN [id number]    Chief Complaint: history of OSA   Brief History:  Karen Potts presents with a 5 year history of sleep apnea. This is worse in the supine position.   Sleep quality is good. This is noted most nights. The patient's bed partner reports  snoring and witnessed apnea at night. The patient relates the following symptoms: occasional snoring  are also present. The patient goes to sleep at 11-12 pm and wakes up at 8 am.   Sleep quality is same when outside home environment.  Patient has noted no restlessness of her legs at night.  The patient  relates no unusual behavior during the night.  The patient denies a history of psychiatric problems. The Epworth Sleepiness Score is 5 out of 24 .  The patient relates  Cardiovascular risk factors include: hypertension, coronary artery disease, s/p stents.  The patient reports using cpap in the past but not in the last six months.     ROS  General: (-) fever, (-) chills, (-) night sweat Nose and Sinuses: (-) nasal stuffiness or itchiness, (-) postnasal drip, (-) nosebleeds, (-) sinus trouble. Mouth and Throat: (-) sore throat, (-) hoarseness. Neck: (-) swollen glands, (-) enlarged thyroid, (-) neck pain. Respiratory: - cough, - shortness of breath, - wheezing. Neurologic: - numbness, - tingling. Psychiatric: - anxiety, - depression Sleep behavior: -sleep paralysis -hypnogogic hallucinations -dream enactment      -vivid dreams -cataplexy -night terrors -sleep walking   Current Medication: Outpatient Encounter Medications as of 02/16/2023  Medication Sig   aspirin EC 81 MG tablet Take 81 mg by mouth daily.   atorvastatin (LIPITOR) 80 MG tablet Take 1 tablet (80 mg total) by mouth daily at 6 PM.   cyclobenzaprine (FLEXERIL) 10 MG tablet Take 10 mg by mouth 2 (two) times daily as needed. (Patient not taking: Reported on 12/15/2021)   lisinopril-hydrochlorothiazide  (PRINZIDE,ZESTORETIC) 10-12.5 MG tablet TAKE 1 TABLET BY MOUTH ONCE DAILY   loratadine (CLARITIN) 10 MG tablet Take by mouth. (Patient not taking: Reported on 02/25/2021)   meclizine (ANTIVERT) 12.5 MG tablet Take 1 tablet (12.5 mg total) by mouth 3 (three) times daily as needed for dizziness or nausea.   metFORMIN (GLUCOPHAGE) 500 MG tablet Take 500 mg by mouth daily with breakfast.   nitroGLYCERIN (NITROSTAT) 0.4 MG SL tablet Place 1 tablet (0.4 mg total) under the tongue every 5 (five) minutes as needed for chest pain. (Patient not taking: Reported on 08/20/2020)   ondansetron (ZOFRAN ODT) 4 MG disintegrating tablet Take 1 tablet (4 mg total) by mouth every 8 (eight) hours as needed. (Patient not taking: Reported on 02/25/2021)   triamcinolone cream (KENALOG) 0.1 % Apply topically 2 (two) times daily.   [DISCONTINUED] pregabalin (LYRICA) 25 MG capsule Take by mouth. (Patient not taking: Reported on 10/13/2022)   No facility-administered encounter medications on file as of 02/16/2023.    Surgical History: Past Surgical History:  Procedure Laterality Date   ABDOMINAL HYSTERECTOMY     BREAST BIOPSY Left 10/2017    APOCRINE TYPE CYST WITH FIBROSIS Of the Wall   BREAST BIOPSY Left 07/15/2022   Stereo Bx, X Clip, Path Pending   BREAST BIOPSY Left 07/15/2022   MM LT BREAST BX W LOC DEV 1ST LESION IMAGE BX SPEC STEREO GUIDE 07/15/2022 [facility]   COLONOSCOPY WITH PROPOFOL N/A 10/20/2022   Procedure: COLONOSCOPY WITH PROPOFOL;  Surgeon: Wyline Mood, MD;  Location: [facility]  ENDOSCOPY;  Service: Gastroenterology;  Laterality: N/A;   CORONARY STENT INTERVENTION N/A 02/24/2018   Procedure: CORONARY STENT INTERVENTION;  Surgeon: Marcina Millard, MD;  Location: [facility] INVASIVE CV LAB;  Service: Cardiovascular;  Laterality: N/A;   HERNIA REPAIR     LEFT HEART CATH AND CORONARY ANGIOGRAPHY Right 02/24/2018   Procedure: LEFT HEART CATH AND CORONARY ANGIOGRAPHY;  Surgeon: Marcina Millard, MD;   Location: [facility] INVASIVE CV LAB;  Service: Cardiovascular;  Laterality: Right;   SHOULDER ARTHROSCOPY WITH OPEN ROTATOR CUFF REPAIR Left 08/14/2017   Procedure: SHOULDER ARTHROSCOPY WITH ROTATOR CUFF REPAIR, BICEPS TENOTOMY, SUBACROMIAL DECOMPRESSION;  Surgeon: Lyndle Herrlich, MD;  Location: [facility] ORS;  Service: Orthopedics;  Laterality: Left;   UMBILICAL HERNIA REPAIR      Medical History: Past Medical History:  Diagnosis Date   Bulging lumbar disc 2022   Diabetes mellitus without complication (HCC)    Hypertension    MI, old 2019   Sleep apnea    Vertigo     Family History: Non contributory to the present illness  Social History: Social History   Socioeconomic History   Marital status: Married    Spouse name: Not on file   Number of children: Not on file   Years of education: Not on file   Highest education level: Not on file  Occupational History   Not on file  Tobacco Use   Smoking status: Some Days    Current packs/day: 0.00    Types: Cigarettes    Last attempt to quit: 02/24/2018    Years since quitting: 4.9   Smokeless tobacco: Never   Tobacco comments:    [name] said she quit smoking the day of her heart attack.  Vaping Use   Vaping status: Never Used  Substance and Sexual Activity   Alcohol use: No   Drug use: No   Sexual activity: Yes    Birth control/protection: Condom  Other Topics Concern   Not on file  Social History Narrative   Not working/disability; smoking; no alcohol. In [place]/ lives with a friend.    Social Determinants of Health   Financial Resource Strain: Not on file  Food Insecurity: Not on file  Transportation Needs: Not on file  Physical Activity: Not on file  Stress: Not on file  Social Connections: Not on file  Intimate Partner Violence: Not on file    Vital Signs: Blood pressure 132/74, pulse 72, resp. rate 16, height 5\' 7"  (1.702 m), weight 266 lb (120.7 kg), SpO2 97%. Body mass index is 41.66 kg/m.    Examination: General Appearance: The patient is well-developed, well-nourished, and in no distress. Neck Circumference: 40 cm Skin: Gross inspection of skin unremarkable. Head: normocephalic, no gross deformities. Eyes: no gross deformities noted. ENT: ears appear grossly normal Neurologic: Alert and oriented. No involuntary movements.    STOP BANG RISK ASSESSMENT S (snore) Have you been told that you snore?     YES   T (tired) Are you often tired, fatigued, or sleepy during the day?   NO  O (obstruction) Do you stop breathing, choke, or gasp during sleep? NO   P (pressure) Do you have or are you being treated for high blood pressure? YES   B (BMI) Is your body index greater than 35 kg/m? YES   A (age) Are you 41 years old or older? YES   N (neck) Do you have a neck circumference greater than 16 inches?   NO   G (gender) Are you a  female? NO   TOTAL STOP/BANG "YES" ANSWERS 4                                                               A STOP-Bang score of 2 or less is considered low risk, and a score of 5 or more is high risk for having either moderate or severe OSA. For people who score 3 or 4, doctors may need to perform further assessment to determine how likely they are to have OSA.         EPWORTH SLEEPINESS SCALE:  Scale:  (0)= no chance of dozing; (1)= slight chance of dozing; (2)= moderate chance of dozing; (3)= high chance of dozing  Chance  Situtation    Sitting and reading: 0    Watching TV: 0    Sitting Inactive in public: 1    As a passenger in car: 0      Lying down to rest: 3    Sitting and talking: 0    Sitting quielty after lunch: 1    In a car, stopped in traffic: 0   TOTAL SCORE:   5 out of 24    SLEEP STUDIES:  No prior study on file - had a study about 5 years ago at [facility]   LABS: No results found for this or any previous visit (from the past 2160 hour(s)).  Radiology: No results found.  No results  found.  No results found.    Assessment and Plan: Patient Active Problem List   Diagnosis Date Noted   Encounter for screening colonoscopy 10/20/2022   Adenomatous polyp of colon 10/20/2022   Morbid obesity (HCC) 09/24/2022   Heart palpitations 07/24/2021   Tinnitus of right ear 08/07/2020   Vertigo 08/07/2020   Difficulty walking 04/18/2020   Numbness and tingling 03/22/2020   Obstructive sleep apnea syndrome 01/18/2020   Thrombocytopenia (HCC) 04/08/2019   HTN (hypertension) 07/05/2018   Tobacco use 07/05/2018   S/P drug eluting coronary stent placement 03/15/2018   Unstable angina (HCC) 02/24/2018   NSTEMI (non-ST elevated myocardial infarction) (HCC) 02/24/2018   Full thickness rotator cuff tear 07/31/2017   Periumbilical hernia 06/06/2014     PLAN OSA:   Patient evaluation suggests high risk of sleep disordered breathing due to history of OSA, snoring, witnessed apnea.  Patient has comorbid cardiovascular risk factors including: hypertension, coronary artery disease  which could be exacerbated by pathologic sleep-disordered breathing.  Suggest: PSG to assess/treat the patient's sleep disordered breathing. The patient was also counselled on weight loss to optimize sleep health.    General Counseling: I have discussed the findings of the evaluation and examination with Serine.  I have also discussed any further diagnostic evaluation thatmay be needed or ordered today. Daziya verbalizes understanding of the findings of todays visit. We also reviewed her medications today and discussed drug interactions and side effects including but not limited excessive drowsiness and altered mental states. We also discussed that there is always a risk not just to her but also people around her. she has been encouraged to call the office with any questions or concerns that should arise related to todays visit.  No orders of the defined types were placed in this encounter.       I  have  personally obtained a history, evaluated the patient, evaluated pertinent data, formulated the assessment and plan and placed orders.   This patient was seen today by Emmaline Kluver, PA-C in collaboration with Dr. Freda Munro.   Yevonne Pax, MD [facility] Diplomate ABMS Pulmonary and Critical Care Medicine Sleep medicine

## 2023-02-25 ENCOUNTER — Inpatient Hospital Stay: Payer: 59 | Attending: Internal Medicine

## 2023-02-25 ENCOUNTER — Inpatient Hospital Stay: Payer: 59 | Admitting: Internal Medicine

## 2023-02-25 ENCOUNTER — Encounter: Payer: Self-pay | Admitting: Internal Medicine

## 2023-02-25 VITALS — BP 140/56 | HR 64 | Temp 97.4°F | Ht 67.0 in | Wt 272.4 lb

## 2023-02-25 DIAGNOSIS — I2510 Atherosclerotic heart disease of native coronary artery without angina pectoris: Secondary | ICD-10-CM | POA: Insufficient documentation

## 2023-02-25 DIAGNOSIS — I252 Old myocardial infarction: Secondary | ICD-10-CM | POA: Insufficient documentation

## 2023-02-25 DIAGNOSIS — D696 Thrombocytopenia, unspecified: Secondary | ICD-10-CM

## 2023-02-25 DIAGNOSIS — D693 Immune thrombocytopenic purpura: Secondary | ICD-10-CM | POA: Diagnosis present

## 2023-02-25 DIAGNOSIS — F17210 Nicotine dependence, cigarettes, uncomplicated: Secondary | ICD-10-CM | POA: Insufficient documentation

## 2023-02-25 DIAGNOSIS — Z79899 Other long term (current) drug therapy: Secondary | ICD-10-CM | POA: Diagnosis not present

## 2023-02-25 DIAGNOSIS — Z7982 Long term (current) use of aspirin: Secondary | ICD-10-CM | POA: Insufficient documentation

## 2023-02-25 LAB — CBC WITH DIFFERENTIAL (CANCER CENTER ONLY)
Abs Immature Granulocytes: 0.02 10*3/uL (ref 0.00–0.07)
Basophils Absolute: 0 10*3/uL (ref 0.0–0.1)
Basophils Relative: 0 %
Eosinophils Absolute: 0.4 10*3/uL (ref 0.0–0.5)
Eosinophils Relative: 4 %
HCT: 42.2 % (ref 36.0–46.0)
Hemoglobin: 13.7 g/dL (ref 12.0–15.0)
Immature Granulocytes: 0 %
Lymphocytes Relative: 54 %
Lymphs Abs: 5.8 10*3/uL — ABNORMAL HIGH (ref 0.7–4.0)
MCH: 27.6 pg (ref 26.0–34.0)
MCHC: 32.5 g/dL (ref 30.0–36.0)
MCV: 85.1 fL (ref 80.0–100.0)
Monocytes Absolute: 0.7 10*3/uL (ref 0.1–1.0)
Monocytes Relative: 6 %
Neutro Abs: 3.9 10*3/uL (ref 1.7–7.7)
Neutrophils Relative %: 36 %
Platelet Count: 107 10*3/uL — ABNORMAL LOW (ref 150–400)
RBC: 4.96 MIL/uL (ref 3.87–5.11)
RDW: 14.6 % (ref 11.5–15.5)
Smear Review: DECREASED
WBC Count: 10.8 10*3/uL — ABNORMAL HIGH (ref 4.0–10.5)
nRBC: 0 % (ref 0.0–0.2)

## 2023-02-25 LAB — VITAMIN B12: Vitamin B-12: 279 pg/mL (ref 180–914)

## 2023-02-25 LAB — CMP (CANCER CENTER ONLY)
ALT: 24 U/L (ref 0–44)
AST: 25 U/L (ref 15–41)
Albumin: 3.9 g/dL (ref 3.5–5.0)
Alkaline Phosphatase: 67 U/L (ref 38–126)
Anion gap: 6 (ref 5–15)
BUN: 15 mg/dL (ref 8–23)
CO2: 26 mmol/L (ref 22–32)
Calcium: 9.2 mg/dL (ref 8.9–10.3)
Chloride: 105 mmol/L (ref 98–111)
Creatinine: 0.88 mg/dL (ref 0.44–1.00)
GFR, Estimated: >60 mL/min (ref >60)
Glucose, Bld: 136 mg/dL — ABNORMAL HIGH (ref 70–99)
Potassium: 3.5 mmol/L (ref 3.5–5.1)
Sodium: 137 mmol/L (ref 135–145)
Total Bilirubin: 0.3 mg/dL (ref 0.3–1.2)
Total Protein: 7.3 g/dL (ref 6.5–8.1)

## 2023-02-25 LAB — LACTATE DEHYDROGENASE: LDH: 122 U/L (ref 98–192)

## 2023-02-25 NOTE — Progress Notes (Signed)
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  Karen Potts 62 y.o.  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   Patient denies any easy bruising or bleeding.No blood in stool.     Having trouble sleeping, cpap, snoring. Waiting to be sch'd for sleep study  Review of Systems  Constitutional:  Positive for malaise/fatigue. Negative for chills, fever and weight loss.  HENT:  Negative for hearing loss, nosebleeds, sore throat and tinnitus.   Eyes:  Negative for blurred vision and double vision.  Respiratory:  Negative for cough, hemoptysis, shortness of breath and wheezing.   Cardiovascular:  Negative for chest pain, palpitations and leg swelling.  Gastrointestinal:  Negative for abdominal pain, blood in stool, constipation, diarrhea, melena, nausea and vomiting.  Genitourinary:  Negative for dysuria and urgency.  Musculoskeletal:  Positive for back pain and joint pain. Negative for falls and myalgias.  Skin:  Negative for itching and rash.  Neurological:  Negative for dizziness, tingling, sensory change, loss of consciousness, weakness and headaches.  Endo/Heme/Allergies:  Negative for environmental allergies. Does not bruise/bleed easily.  Psychiatric/Behavioral:  Negative for depression. The patient is not nervous/anxious and does not have  insomnia.      MEDICAL HISTORY:  Past Medical History:  Diagnosis Date   Bulging lumbar disc 2022   Diabetes mellitus without complication (HCC)    Hypertension    MI, old 2019   Sleep apnea    Vertigo     SURGICAL HISTORY: Past Surgical History:  Procedure Laterality Date   ABDOMINAL HYSTERECTOMY     BREAST BIOPSY Left 10/2017    APOCRINE TYPE CYST WITH FIBROSIS Of the Wall   BREAST BIOPSY Left 07/15/2022   Stereo Bx, X Clip, Path Pending   BREAST BIOPSY Left 07/15/2022   MM LT BREAST BX W LOC DEV 1ST LESION IMAGE BX SPEC STEREO GUIDE 07/15/2022 [facility]   COLONOSCOPY WITH PROPOFOL N/A 10/20/2022   Procedure: COLONOSCOPY WITH PROPOFOL;  Surgeon: Wyline Mood, MD;  Location: [facility] ENDOSCOPY;  Service: Gastroenterology;  Laterality: N/A;   CORONARY STENT INTERVENTION N/A 02/24/2018   Procedure: CORONARY STENT INTERVENTION;  Surgeon: Marcina Millard, MD;  Location: [facility] INVASIVE CV LAB;  Service: Cardiovascular;  Laterality: N/A;   HERNIA REPAIR     LEFT HEART CATH AND CORONARY ANGIOGRAPHY Right 02/24/2018   Procedure: LEFT HEART CATH AND CORONARY ANGIOGRAPHY;  Surgeon: Marcina Millard, MD;  Location: [facility]AS[facility] CV LAB;  Service: Cardiovascular;  Laterality: Right;   SHOULDER ARTHROSCOPY WITH OPEN ROTATOR CUFF REPAIR Left 08/14/2017   Procedure: SHOULDER ARTHROSCOPY WITH ROTATOR CUFF REPAIR, BICEPS TENOTOMY, SUBACROMIAL DECOMPRESSION;  Surgeon: Lyndle Herrlich, MD;  Location: [facility] ORS;  Service: Orthopedics;  Laterality: Left;   UMBILICAL HERNIA REPAIR      SOCIAL HISTORY: Social History   Socioeconomic History   Marital status: Married    Spouse name: Not on file   Number of children: Not on file   Years of education: Not on file  Highest education level: Not on file  Occupational History   Not on file  Tobacco Use   Smoking status: Some Days    Current packs/day: 0.00    Types: Cigarettes    Last attempt to quit: 02/24/2018    Years since  quitting: 5.0   Smokeless tobacco: Never   Tobacco comments:    Taisley said she quit smoking the day of her heart attack.  Vaping Use   Vaping status: Never Used  Substance and Sexual Activity   Alcohol use: No   Drug use: No   Sexual activity: Yes    Birth control/protection: Condom  Other Topics Concern   Not on file  Social History Narrative   Not working/disability; smoking; no alcohol. In [place]/ lives with a friend.    Social Determinants of Health   Financial Resource Strain: Not on file  Food Insecurity: Not on file  Transportation Needs: Not on file  Physical Activity: Not on file  Stress: Not on file  Social Connections: Not on file  Intimate Partner Violence: Not on file    FAMILY HISTORY: Family History  Problem Relation Age of Onset   Breast cancer Neg Hx     ALLERGIES:  has No Known Allergies.  MEDICATIONS:  Current Outpatient Medications  Medication Sig Dispense Refill   aspirin EC 81 MG tablet Take 81 mg by mouth daily.     atorvastatin (LIPITOR) 80 MG tablet Take 1 tablet (80 mg total) by mouth daily at 6 PM. 30 tablet 0   lisinopril-hydrochlorothiazide (PRINZIDE,ZESTORETIC) 10-12.5 MG tablet TAKE 1 TABLET BY MOUTH ONCE DAILY 90 tablet 1   meclizine (ANTIVERT) 12.5 MG tablet Take 1 tablet (12.5 mg total) by mouth 3 (three) times daily as needed for dizziness or nausea. 30 tablet 1   metFORMIN (GLUCOPHAGE) 500 MG tablet Take 500 mg by mouth daily with breakfast.     triamcinolone cream (KENALOG) 0.1 % Apply topically 2 (two) times daily.     cyclobenzaprine (FLEXERIL) 10 MG tablet Take 10 mg by mouth 2 (two) times daily as needed. (Patient not taking: Reported on 12/15/2021)     loratadine (CLARITIN) 10 MG tablet Take by mouth. (Patient not taking: Reported on 02/25/2021)     nitroGLYCERIN (NITROSTAT) 0.4 MG SL tablet Place 1 tablet (0.4 mg total) under the tongue every 5 (five) minutes as needed for chest pain. (Patient not taking: Reported on  08/20/2020) 30 tablet 12   ondansetron (ZOFRAN ODT) 4 MG disintegrating tablet Take 1 tablet (4 mg total) by mouth every 8 (eight) hours as needed. (Patient not taking: Reported on 02/25/2021) 20 tablet 0   No current facility-administered medications for this visit.     Marland Kitchen  PHYSICAL EXAMINATION:   Vitals:   02/25/23 1501  BP: (!) 140/56  Pulse: 64  Temp: (!) 97.4 F (36.3 C)  SpO2: 94%   Filed Weights   02/25/23 1501  Weight: 272 lb 6.4 oz (123.6 kg)    Physical Exam HENT:     Head: Normocephalic and atraumatic.     Mouth/Throat:     Pharynx: No oropharyngeal exudate.  Eyes:     Pupils: Pupils are equal, round, and reactive to light.  Cardiovascular:     Rate and Rhythm: Normal rate and regular rhythm.  Pulmonary:     Effort: No respiratory distress.     Breath sounds: No wheezing.  Abdominal:     General: Bowel sounds are normal. There is no distension.  Palpations: Abdomen is soft. There is no mass.     Tenderness: There is no abdominal tenderness. There is no guarding or rebound.  Musculoskeletal:        General: No tenderness. Normal range of motion.     Cervical back: Normal range of motion and neck supple.  Skin:    General: Skin is warm.  Neurological:     Mental Status: She is alert and oriented to person, place, and time.  Psychiatric:        Mood and Affect: Affect normal.      LABORATORY DATA:  I have reviewed the data as listed Lab Results  Component Value Date   WBC 12.0 (H) 08/27/2022   HGB 14.0 08/27/2022   HCT 43.7 08/27/2022   MCV 86.2 08/27/2022   PLT 124 (L) 08/27/2022   Recent Labs    08/27/22 1507 02/25/23 1504  NA 137 137  K 3.5 3.5  CL 101 105  CO2 27 26  GLUCOSE 120* 136*  BUN 13 15  CREATININE 0.82 0.88  CALCIUM 9.0 9.2  GFRNONAA >60 >60  PROT 7.8 7.3  ALBUMIN 4.0 3.9  AST 28 25  ALT 23 24  ALKPHOS 81 67  BILITOT 0.7 0.3     No results found.  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  Platelets today are 107 - stable. Asymptomatic.  Would not recommend treatment unless less than 50 or symptomatic./See below- stable.   #Possible knee surgery [s/p cortisone shot; Dr.Bowers]; recommend in follow-up as if she needed hematology clearance; try to lose weight- stable.  # OSA- awaiting Sleep study-   # CAD/MI- [Aug 2019]- asprin; Effient Jocundus.Bianchi ] [Dr.Parschoes/]-stable  # DISPOSITION: # follow up in 6 month-MD: labs- cbc/cmp;LDH/b12 levels- Dr.B   Earna Coder, MD 02/25/2023

## 2023-02-25 NOTE — Progress Notes (Signed)
Bruising: no Bleeding from gums: no  Having trouble sleeping, cpap, snoring. Waiting to be sch'd for sleep study.

## 2023-02-25 NOTE — Assessment & Plan Note (Addendum)
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  Platelets today are 107 - stable. Asymptomatic.  Would not recommend treatment unless less than 50 or symptomatic./See below- stable.   #Possible knee surgery [s/p cortisone shot; Dr.Bowers]; recommend in follow-up as if she needed hematology clearance; try to lose weight- stable.  # OSA- awaiting Sleep study-   # CAD/MI- [Aug 2019]- asprin; Effient Jocundus.Bianchi ] [Dr.Parschoes/]-stable  # DISPOSITION: # follow up in 6 month-MD: labs- cbc/cmp;LDH/b12 levels- Dr.B

## 2023-06-19 ENCOUNTER — Ambulatory Visit
Admission: RE | Admit: 2023-06-19 | Discharge: 2023-06-19 | Disposition: A | Discharge status: Home / Self Care | Payer: 59 | Source: Ambulatory Visit | Attending: Nurse Practitioner | Admitting: Nurse Practitioner

## 2023-06-19 DIAGNOSIS — Z1231 Encounter for screening mammogram for malignant neoplasm of breast: Secondary | ICD-10-CM | POA: Insufficient documentation

## 2023-07-10 NOTE — Progress Notes (Signed)
 [facility] [address] [place], [place] [address]  Pulmonary Sleep Medicine   Office Visit Note  Patient Name: Karen Potts DOB: [birth year] MRN [id number]    Chief Complaint: Obstructive Sleep Apnea visit  Brief History:  Karen Potts is seen today for a follow up visit for CPAP@ 12 cmH2O. The patient has a 6 year history of sleep apnea. Patient is using PAP nightly.  The patient feels rested after sleeping with PAP.  The patient reports benefiting from PAP use. Reported sleepiness is improved and the Epworth Sleepiness Score is 0 out of 24. The patient does not take naps. The patient complains of the following: none.  The compliance download shows 99% compliance with an average use time of 6 hours 3 minutes. The AHI is 5.9.  The patient does not complain of limb movements disrupting sleep. The patient continues to require PAP therapy in order to eliminate sleep apnea.   ROS  General: (-) fever, (-) chills, (-) night sweat Nose and Sinuses: (-) nasal stuffiness or itchiness, (-) postnasal drip, (-) nosebleeds, (-) sinus trouble. Mouth and Throat: (-) sore throat, (-) hoarseness. Neck: (-) swollen glands, (-) enlarged thyroid, (-) neck pain. Respiratory: - cough, - shortness of breath, - wheezing. Neurologic: - numbness, - tingling. Psychiatric: - anxiety, - depression   Current Medication: Outpatient Encounter Medications as of 07/13/2023  Medication Sig   aspirin  EC 81 MG tablet Take 81 mg by mouth daily.   atorvastatin  (LIPITOR) 80 MG tablet Take 1 tablet (80 mg total) by mouth daily at 6 PM.   cyclobenzaprine (FLEXERIL) 10 MG tablet Take 10 mg by mouth 2 (two) times daily as needed. (Patient not taking: Reported on 12/15/2021)   lisinopril -hydrochlorothiazide  (PRINZIDE ,ZESTORETIC ) 10-12.5 MG tablet TAKE 1 TABLET BY MOUTH ONCE DAILY   loratadine (CLARITIN) 10 MG tablet Take by mouth. (Patient not taking: Reported on 02/25/2021)   meclizine  (ANTIVERT ) 12.5 MG tablet  Take 1 tablet (12.5 mg total) by mouth 3 (three) times daily as needed for dizziness or nausea.   metFORMIN (GLUCOPHAGE) 500 MG tablet Take 500 mg by mouth daily with breakfast.   nitroGLYCERIN  (NITROSTAT ) 0.4 MG SL tablet Place 1 tablet (0.4 mg total) under the tongue every 5 (five) minutes as needed for chest pain. (Patient not taking: Reported on 08/20/2020)   ondansetron  (ZOFRAN  ODT) 4 MG disintegrating tablet Take 1 tablet (4 mg total) by mouth every 8 (eight) hours as needed. (Patient not taking: Reported on 02/25/2021)   triamcinolone cream (KENALOG) 0.1 % Apply topically 2 (two) times daily.   No facility-administered encounter medications on file as of 07/13/2023.    Surgical History: Past Surgical History:  Procedure Laterality Date   ABDOMINAL HYSTERECTOMY     BREAST BIOPSY Left 10/2017    APOCRINE TYPE CYST WITH FIBROSIS Of the Wall   BREAST BIOPSY Left 07/15/2022   Stereo Bx, X Clip, neg   BREAST BIOPSY Left 07/15/2022   MM LT BREAST BX W LOC DEV 1ST LESION IMAGE BX SPEC STEREO GUIDE 07/15/2022 [facility]   COLONOSCOPY WITH PROPOFOL  N/A 10/20/2022   Procedure: COLONOSCOPY WITH PROPOFOL ;  Surgeon: Therisa Bi, MD;  Location: [facility] ENDOSCOPY;  Service: Gastroenterology;  Laterality: N/A;   CORONARY STENT INTERVENTION N/A 02/24/2018   Procedure: CORONARY STENT INTERVENTION;  Surgeon: Ammon Blunt, MD;  Location: [facility] INVASIVE CV LAB;  Service: Cardiovascular;  Laterality: N/A;   HERNIA REPAIR     LEFT HEART CATH AND CORONARY ANGIOGRAPHY Right 02/24/2018   Procedure: LEFT HEART  CATH AND CORONARY ANGIOGRAPHY;  Surgeon: Ammon Blunt, MD;  Location: [facility] INVASIVE CV LAB;  Service: Cardiovascular;  Laterality: Right;   SHOULDER ARTHROSCOPY WITH OPEN ROTATOR CUFF REPAIR Left 08/14/2017   Procedure: SHOULDER ARTHROSCOPY WITH ROTATOR CUFF REPAIR, BICEPS TENOTOMY, SUBACROMIAL DECOMPRESSION;  Surgeon: Leora Lynwood SAUNDERS, MD;  Location: [facility] ORS;  Service: Orthopedics;   Laterality: Left;   UMBILICAL HERNIA REPAIR      Medical History: Past Medical History:  Diagnosis Date   Bulging lumbar disc 2022   Diabetes mellitus without complication (HCC)    Hypertension    MI, old 2019   Sleep apnea    Vertigo     Family History: Non contributory to the present illness  Social History: Social History   Socioeconomic History   Marital status: Married    Spouse name: Not on file   Number of children: Not on file   Years of education: Not on file   Highest education level: Not on file  Occupational History   Not on file  Tobacco Use   Smoking status: Some Days    Current packs/day: 0.00    Types: Cigarettes    Last attempt to quit: 02/24/2018    Years since quitting: 5.3   Smokeless tobacco: Never   Tobacco comments:    Jaclyn said she quit smoking the day of her heart attack.  Vaping Use   Vaping status: Never Used  Substance and Sexual Activity   Alcohol use: No   Drug use: No   Sexual activity: Yes    Birth control/protection: Condom  Other Topics Concern   Not on file  Social History Narrative   Not working/disability; smoking; no alcohol. In [place]/ lives with a friend.    Social Drivers of Corporate Investment Banker Strain: Not on file  Food Insecurity: Not on file  Transportation Needs: Not on file  Physical Activity: Not on file  Stress: Not on file  Social Connections: Not on file  Intimate Partner Violence: Not on file    Vital Signs: Blood pressure 138/77, pulse 71, resp. rate 16, height 5' 7 (1.702 m), weight 275 lb (124.7 kg), SpO2 98%. Body mass index is 43.07 kg/m.    Examination: General Appearance: The patient is well-developed, well-nourished, and in no distress. Neck Circumference: 40 cm Skin: Gross inspection of skin unremarkable. Head: normocephalic, no gross deformities. Eyes: no gross deformities noted. ENT: ears appear grossly normal Neurologic: Alert and oriented. No involuntary  movements.  STOP BANG RISK ASSESSMENT S (snore) Have you been told that you snore?     NO   T (tired) Are you often tired, fatigued, or sleepy during the day?   NO  O (obstruction) Do you stop breathing, choke, or gasp during sleep? NO   P (pressure) Do you have or are you being treated for high blood pressure? YES   B (BMI) Is your body index greater than 35 kg/m? YES   A (age) Are you 76 years old or older? YES   N (neck) Do you have a neck circumference greater than 16 inches?   NO   G (gender) Are you a female? NO   TOTAL STOP/BANG "YES" ANSWERS 3       A STOP-Bang score of 2 or less is considered low risk, and a score of 5 or more is high risk for having either moderate or severe OSA. For people who score 3 or 4, doctors may need to perform further assessment to determine  how likely they are to have OSA.         EPWORTH SLEEPINESS SCALE:  Scale:  (0)= no chance of dozing; (1)= slight chance of dozing; (2)= moderate chance of dozing; (3)= high chance of dozing  Chance  Situtation    Sitting and reading: 0    Watching TV: 0    Sitting Inactive in public: 0    As a passenger in car: 0      Lying down to rest: 0    Sitting and talking: 0    Sitting quielty after lunch: 0    In a car, stopped in traffic: 0   TOTAL SCORE:   0 out of 24    SLEEP STUDIES:  PSG (03/2023) AHI 12.3/hr, Supine AHI 41/hr, REM AHI 21.4/hr, min SpO2 86% Titration (03/2023) CPAP@ 12 cmH2O   CPAP COMPLIANCE DATA:  Date Range: 04/11/2023-07/09/2023  Average Daily Use: 6 hours 33 minutes  Median Use: 6 hours 35 minutes  Compliance for > 4 Hours: 99%  AHI: 5.9 respiratory events per hour  Days Used: 90/90 days  Mask Leak: 102.3  95th Percentile Pressure: 12         LABS: No results found for this or any previous visit (from the past 2160 hours).  Radiology: MM 3D SCREENING MAMMOGRAM BILATERAL BREAST Result Date: 06/25/2023 CLINICAL DATA:  Screening. EXAM:  DIGITAL SCREENING BILATERAL MAMMOGRAM WITH TOMOSYNTHESIS AND CAD TECHNIQUE: Bilateral screening digital craniocaudal and mediolateral oblique mammograms were obtained. Bilateral screening digital breast tomosynthesis was performed. The images were evaluated with computer-aided detection. COMPARISON:  Previous exam(s). ACR Breast Density Category b: There are scattered areas of fibroglandular density. FINDINGS: There are no findings suspicious for malignancy. IMPRESSION: No mammographic evidence of malignancy. A result letter of this screening mammogram will be mailed directly to the patient. RECOMMENDATION: Screening mammogram in one year. (Code:SM-B-01Y) BI-RADS CATEGORY  1: Negative. Electronically Signed   By: Toribio Agreste M.D.   On: 06/25/2023 08:37    No results found.  MM 3D SCREENING MAMMOGRAM BILATERAL BREAST Result Date: 06/25/2023 CLINICAL DATA:  Screening. EXAM: DIGITAL SCREENING BILATERAL MAMMOGRAM WITH TOMOSYNTHESIS AND CAD TECHNIQUE: Bilateral screening digital craniocaudal and mediolateral oblique mammograms were obtained. Bilateral screening digital breast tomosynthesis was performed. The images were evaluated with computer-aided detection. COMPARISON:  Previous exam(s). ACR Breast Density Category b: There are scattered areas of fibroglandular density. FINDINGS: There are no findings suspicious for malignancy. IMPRESSION: No mammographic evidence of malignancy. A result letter of this screening mammogram will be mailed directly to the patient. RECOMMENDATION: Screening mammogram in one year. (Code:SM-B-01Y) BI-RADS CATEGORY  1: Negative. Electronically Signed   By: Toribio Agreste M.D.   On: 06/25/2023 08:37      Assessment and Plan: Patient Active Problem List   Diagnosis Date Noted   OSA (obstructive sleep apnea) 07/13/2023   CPAP use counseling 07/13/2023   Obesity, morbid (HCC) 07/13/2023   Encounter for screening colonoscopy 10/20/2022   Adenomatous polyp of colon 10/20/2022    Morbid obesity (HCC) 09/24/2022   Heart palpitations 07/24/2021   Tinnitus of right ear 08/07/2020   Vertigo 08/07/2020   Difficulty walking 04/18/2020   Numbness and tingling 03/22/2020   Obstructive sleep apnea syndrome 01/18/2020   Thrombocytopenia (HCC) 04/08/2019   HTN (hypertension) 07/05/2018   Tobacco use 07/05/2018   S/P drug eluting coronary stent placement 03/15/2018   Unstable angina (HCC) 02/24/2018   NSTEMI (non-ST elevated myocardial infarction) (HCC) 02/24/2018   Full thickness rotator cuff tear  07/31/2017   Periumbilical hernia 06/06/2014   1. OSA (obstructive sleep apnea) (Primary) The patient does tolerate PAP and reports  benefit from PAP use. Her AHI is variable and above goal due to high mask leak. The patient was reminded how to clean equipment and advised to replace supplies routinely. She just received a mask and will try this to see if it helps with leak. The patient was also counselled on weight loss. The compliance is excellent. The AHI is 5.9.   OSA on cpap- elevated ahi due to mask leak. Continue with excellent compliance. CPAP continues to be medically necessary to control this patient's OSA   2. CPAP use counseling CPAP Counseling: had a lengthy discussion with the patient regarding the importance of PAP therapy in management of the sleep apnea. Patient appears to understand the risk factor reduction and also understands the risks associated with untreated sleep apnea. Patient will try to make a good faith effort to remain compliant with therapy. Also instructed the patient on proper cleaning of the device including the water must be changed daily if possible and use of distilled water is preferred. Patient understands that the machine should be regularly cleaned with appropriate recommended cleaning solutions that do not damage the PAP machine for example given white vinegar and water rinses. Other methods such as ozone treatment may not be as good as these  simple methods to achieve cleaning.   3. Obesity, morbid (HCC) Obesity Counseling: Had a lengthy discussion regarding patients BMI and weight issues. Patient was instructed on portion control as well as increased activity. Also discussed caloric restrictions with trying to maintain intake less than 2000 Kcal. Discussions were made in accordance with the 5As of weight management. Simple actions such as not eating late and if able to, taking a walk is suggested.      General Counseling: I have discussed the findings of the evaluation and examination with Sarra.  I have also discussed any further diagnostic evaluation thatmay be needed or ordered today. Jodye verbalizes understanding of the findings of todays visit. We also reviewed her medications today and discussed drug interactions and side effects including but not limited excessive drowsiness and altered mental states. We also discussed that there is always a risk not just to her but also people around her. she has been encouraged to call the office with any questions or concerns that should arise related to todays visit.  No orders of the defined types were placed in this encounter.       I have personally obtained a history, examined the patient, evaluated laboratory and imaging results, formulated the assessment and plan and placed orders. This patient was seen today by Lauraine Lay, PA-C in collaboration with Dr. Elfreda Bathe.   Elfreda DELENA Bathe, MD [facility] Diplomate ABMS Pulmonary Critical Care Medicine and Sleep Medicine

## 2023-07-13 ENCOUNTER — Ambulatory Visit (INDEPENDENT_AMBULATORY_CARE_PROVIDER_SITE_OTHER): Payer: 59 | Admitting: Internal Medicine

## 2023-07-13 VITALS — BP 138/77 | HR 71 | Resp 16 | Ht 67.0 in | Wt 275.0 lb

## 2023-07-13 DIAGNOSIS — G4733 Obstructive sleep apnea (adult) (pediatric): Secondary | ICD-10-CM

## 2023-07-13 DIAGNOSIS — Z7189 Other specified counseling: Secondary | ICD-10-CM | POA: Diagnosis not present

## 2023-07-13 NOTE — Patient Instructions (Signed)

## 2023-08-21 ENCOUNTER — Emergency Department
Admission: EM | Admit: 2023-08-21 | Discharge: 2023-08-21 | Disposition: A | Discharge status: Home / Self Care | Payer: 59 | Attending: Emergency Medicine | Admitting: Emergency Medicine

## 2023-08-21 ENCOUNTER — Emergency Department: Payer: 59

## 2023-08-21 ENCOUNTER — Other Ambulatory Visit: Payer: Self-pay

## 2023-08-21 DIAGNOSIS — H538 Other visual disturbances: Secondary | ICD-10-CM | POA: Insufficient documentation

## 2023-08-21 DIAGNOSIS — I10 Essential (primary) hypertension: Secondary | ICD-10-CM | POA: Diagnosis not present

## 2023-08-21 DIAGNOSIS — R112 Nausea with vomiting, unspecified: Secondary | ICD-10-CM | POA: Insufficient documentation

## 2023-08-21 DIAGNOSIS — D72829 Elevated white blood cell count, unspecified: Secondary | ICD-10-CM | POA: Insufficient documentation

## 2023-08-21 DIAGNOSIS — R519 Headache, unspecified: Secondary | ICD-10-CM | POA: Insufficient documentation

## 2023-08-21 DIAGNOSIS — E119 Type 2 diabetes mellitus without complications: Secondary | ICD-10-CM | POA: Diagnosis not present

## 2023-08-21 DIAGNOSIS — R42 Dizziness and giddiness: Secondary | ICD-10-CM | POA: Insufficient documentation

## 2023-08-21 LAB — BASIC METABOLIC PANEL
Anion gap: 9 (ref 5–15)
BUN: 17 mg/dL (ref 8–23)
CO2: 26 mmol/L (ref 22–32)
Calcium: 9.1 mg/dL (ref 8.9–10.3)
Chloride: 103 mmol/L (ref 98–111)
Creatinine, Ser: 0.79 mg/dL (ref 0.44–1.00)
GFR, Estimated: >60 mL/min (ref >60)
Glucose, Bld: 147 mg/dL — ABNORMAL HIGH (ref 70–99)
Potassium: 4.1 mmol/L (ref 3.5–5.1)
Sodium: 138 mmol/L (ref 135–145)

## 2023-08-21 LAB — CBC
HCT: 41 % (ref 36.0–46.0)
Hemoglobin: 13.4 g/dL (ref 12.0–15.0)
MCH: 27.9 pg (ref 26.0–34.0)
MCHC: 32.7 g/dL (ref 30.0–36.0)
MCV: 85.2 fL (ref 80.0–100.0)
Platelets: 136 10*3/uL — ABNORMAL LOW (ref 150–400)
RBC: 4.81 MIL/uL (ref 3.87–5.11)
RDW: 15.2 % (ref 11.5–15.5)
WBC: 12.5 10*3/uL — ABNORMAL HIGH (ref 4.0–10.5)
nRBC: 0 % (ref 0.0–0.2)

## 2023-08-21 LAB — URINALYSIS, ROUTINE W REFLEX MICROSCOPIC
Bilirubin Urine: NEGATIVE
Glucose, UA: NEGATIVE mg/dL
Hgb urine dipstick: NEGATIVE
Ketones, ur: NEGATIVE mg/dL
Leukocytes,Ua: NEGATIVE
Nitrite: NEGATIVE
Protein, ur: NEGATIVE mg/dL
Specific Gravity, Urine: 1.025 (ref 1.005–1.030)
pH: 5 (ref 5.0–8.0)

## 2023-08-21 LAB — RESP PANEL BY RT-PCR (RSV, FLU A&B, COVID)  RVPGX2
Influenza A by PCR: NEGATIVE
Influenza B by PCR: NEGATIVE
Resp Syncytial Virus by PCR: NEGATIVE
SARS Coronavirus 2 by RT PCR: NEGATIVE

## 2023-08-21 LAB — TROPONIN I (HIGH SENSITIVITY): Troponin I (High Sensitivity): 3 ng/L (ref <18)

## 2023-08-21 MED ORDER — ONDANSETRON 4 MG PO TBDP: 4.0000 mg | ORAL_TABLET | Freq: Three times a day (TID) | ORAL | 0 refills | Status: DC | PRN

## 2023-08-21 MED ORDER — ACETAMINOPHEN 500 MG PO TABS
1000.0000 mg | ORAL_TABLET | Freq: Once | ORAL | Status: AC
  Administered 2023-08-21: 1000 mg via ORAL
  Filled 2023-08-21: qty 2

## 2023-08-21 NOTE — ED Triage Notes (Signed)
Pt here with a headache, N/V that started 3 days ago. Pt states the pain has been throbbing in nature, pt states she woke up with dizziness this morning, she took her Meclizine and then it eased up. Pt thought she was having vertigo but states it does not feel the same as usual. Pt states her vision is still slightly blurry.

## 2023-08-21 NOTE — ED Triage Notes (Signed)
Pt comes with c/o N/V. Pt states this started this morning. Pt has hx of vertigo. EMS gave 4 zofran.  VSS CBG 175

## 2023-08-21 NOTE — ED Notes (Addendum)
Pt states her last headache, she ended up having a heart attack with no cp symptoms. Troponin added to labs.

## 2023-08-21 NOTE — ED Provider Notes (Signed)
 [facility] Provider Note    Event Date/Time   First MD Initiated Contact with Patient 08/21/23 1435     (approximate)   History   Headache   HPI  Karen Potts is a 63 y.o. female with history of NSTEMI, hypertension, vertigo, diabetes  and as listed in EMR presents to the emergency department for treatment and evaluation of headache and dizziness.  Patient states that she has had a headache for the past 3 days that is diffuse and throbbing in nature.  Upon awakening this morning between 6 and 7 AM, she felt dizzy and started to have some blurred vision.  She has a history of vertigo and took some meclizine around 8 AM.  No improvement.  She continues to have blurred vision and states that her headache is worse if she turns her head to the right.      Physical Exam   Triage Vital Signs: ED Triage Vitals [08/21/23 1157]  Encounter Vitals Group     BP (!) 123/95     Systolic BP Percentile      Diastolic BP Percentile      Pulse Rate 61     Resp 18     Temp 98 F (36.7 C)     Temp Source Oral     SpO2 100 %     Weight 274 lb 14.6 oz (124.7 kg)     Height 5\' 7"  (1.702 m)     Head Circumference      Peak Flow      Pain Score 10     Pain Loc      Pain Education      Exclude from Growth Chart     Most recent vital signs: Vitals:   08/21/23 1157 08/21/23 1923  BP: (!) 123/95 (!) 130/48  Pulse: 61   Resp: 18 15  Temp: 98 F (36.7 C)   SpO2: 100%     General: Awake, no distress.  CV:  Good peripheral perfusion.  Resp:  Normal effort. Breath sounds clear Abd:  No distention. Soft Other:  No slurred speech.  Tongue protrudes midline.  She is able to shrug her shoulders against resistance.  Extremity strength is 5/5.  No altered sensation.  Pupils 3 mm, round, reactive to light.   ED Results / Procedures / Treatments   Labs (all labs ordered are listed, but only abnormal results are displayed) Labs Reviewed  BASIC METABOLIC PANEL -  Abnormal; Notable for the following components:      Result Value   Glucose, Bld 147 (*)    All other components within normal limits  CBC - Abnormal; Notable for the following components:   WBC 12.5 (*)    Platelets 136 (*)    All other components within normal limits  URINALYSIS, ROUTINE W REFLEX MICROSCOPIC - Abnormal; Notable for the following components:   Color, Urine YELLOW (*)    APPearance HAZY (*)    All other components within normal limits  RESP PANEL BY RT-PCR (RSV, FLU A&B, COVID)  [id number]  CBG MONITORING, ED  TROPONIN I (HIGH SENSITIVITY)     EKG  Bradycardic at 52, sinus, unchanged from previous   RADIOLOGY  Image and radiology report reviewed and interpreted by me. Radiology report consistent with the same.  Head CT shows no acute abnormalities.  PROCEDURES:  Critical Care performed: No  Procedures   MEDICATIONS ORDERED IN ED:  Medications  acetaminophen (TYLENOL) tablet 1,000 mg (1,000  mg Oral Given 08/21/23 1520)     IMPRESSION / MDM / ASSESSMENT AND PLAN / ED COURSE   I have reviewed the triage note.  Differential diagnosis includes, but is not limited to, TIA/CVA, migraine, vertigo, COVID, influenza  Patient's presentation is most consistent with acute presentation with potential threat to life or bodily function.  63 year old female presenting to the emergency department for treatment and evaluation of 3 days of headache with nausea and vomiting with onset of dizziness and blurred vision this morning.  See HPI for further details.  While awaiting ER room assignment, labs were obtained.  She has a mild leukocytosis at 12.5.  BMP shows glucose of 147.  Initial troponin is normal at 3.  EKG is reassuring.  On exam, the patient has no focal weakness.  She states that if she lays with her head turned to the left that the headache and blurred vision improves.  If she turns her head to the right or sits up it gets worse.  She has not yet taken  anything for the headache today.  Tylenol ordered.  Respiratory panel ordered  Patient care relinquished to and evaluated by ED attending, Dr. Cyril Loosen.  Plan will be to proceed with MRI of the brain.  Patient aware and agreeable.      FINAL CLINICAL IMPRESSION(S) / ED DIAGNOSES   Final diagnoses:  Vertigo     Rx / DC Orders   ED Discharge Orders          Ordered    ondansetron (ZOFRAN-ODT) 4 MG disintegrating tablet  Every 8 hours PRN        08/21/23 1935             Note:  This document was prepared using Dragon voice recognition software and may include unintentional dictation errors.   Chinita Pester, FNP 08/26/23 1217    Jene Every, MD 08/29/23 1158

## 2023-08-21 NOTE — ED Provider Notes (Signed)
MRI is negative for acute abnormality, appropriate for discharge at this time, suspect vertigo   Jene Every, MD 08/21/23 Barry Brunner

## 2023-08-28 ENCOUNTER — Inpatient Hospital Stay (HOSPITAL_BASED_OUTPATIENT_CLINIC_OR_DEPARTMENT_OTHER): Payer: 59 | Admitting: Internal Medicine

## 2023-08-28 ENCOUNTER — Inpatient Hospital Stay: Payer: 59 | Attending: Internal Medicine

## 2023-08-28 ENCOUNTER — Inpatient Hospital Stay: Payer: 59

## 2023-08-28 ENCOUNTER — Encounter: Payer: Self-pay | Admitting: Internal Medicine

## 2023-08-28 VITALS — BP 120/60 | HR 73 | Temp 97.4°F | Resp 18 | Wt 270.8 lb

## 2023-08-28 DIAGNOSIS — I252 Old myocardial infarction: Secondary | ICD-10-CM | POA: Insufficient documentation

## 2023-08-28 DIAGNOSIS — Z7982 Long term (current) use of aspirin: Secondary | ICD-10-CM | POA: Diagnosis not present

## 2023-08-28 DIAGNOSIS — I2510 Atherosclerotic heart disease of native coronary artery without angina pectoris: Secondary | ICD-10-CM | POA: Diagnosis not present

## 2023-08-28 DIAGNOSIS — D696 Thrombocytopenia, unspecified: Secondary | ICD-10-CM | POA: Diagnosis present

## 2023-08-28 DIAGNOSIS — F17210 Nicotine dependence, cigarettes, uncomplicated: Secondary | ICD-10-CM | POA: Insufficient documentation

## 2023-08-28 DIAGNOSIS — Z79899 Other long term (current) drug therapy: Secondary | ICD-10-CM | POA: Diagnosis not present

## 2023-08-28 DIAGNOSIS — R110 Nausea: Secondary | ICD-10-CM

## 2023-08-28 LAB — CBC WITH DIFFERENTIAL (CANCER CENTER ONLY)
Abs Immature Granulocytes: 0.04 10*3/uL (ref 0.00–0.07)
Basophils Absolute: 0.1 10*3/uL (ref 0.0–0.1)
Basophils Relative: 0 %
Eosinophils Absolute: 0.2 10*3/uL (ref 0.0–0.5)
Eosinophils Relative: 2 %
HCT: 40.8 % (ref 36.0–46.0)
Hemoglobin: 13.5 g/dL (ref 12.0–15.0)
Immature Granulocytes: 0 %
Lymphocytes Relative: 42 %
Lymphs Abs: 4.8 10*3/uL — ABNORMAL HIGH (ref 0.7–4.0)
MCH: 27.9 pg (ref 26.0–34.0)
MCHC: 33.1 g/dL (ref 30.0–36.0)
MCV: 84.3 fL (ref 80.0–100.0)
Monocytes Absolute: 0.6 10*3/uL (ref 0.1–1.0)
Monocytes Relative: 6 %
Neutro Abs: 5.8 10*3/uL (ref 1.7–7.7)
Neutrophils Relative %: 50 %
Platelet Count: 120 10*3/uL — ABNORMAL LOW (ref 150–400)
RBC: 4.84 MIL/uL (ref 3.87–5.11)
RDW: 15 % (ref 11.5–15.5)
Smear Review: NORMAL
WBC Count: 11.5 10*3/uL — ABNORMAL HIGH (ref 4.0–10.5)
nRBC: 0 % (ref 0.0–0.2)

## 2023-08-28 LAB — CMP (CANCER CENTER ONLY)
ALT: 25 U/L (ref 0–44)
AST: 22 U/L (ref 15–41)
Albumin: 3.8 g/dL (ref 3.5–5.0)
Alkaline Phosphatase: 74 U/L (ref 38–126)
Anion gap: 8 (ref 5–15)
BUN: 18 mg/dL (ref 8–23)
CO2: 24 mmol/L (ref 22–32)
Calcium: 8.9 mg/dL (ref 8.9–10.3)
Chloride: 102 mmol/L (ref 98–111)
Creatinine: 0.77 mg/dL (ref 0.44–1.00)
GFR, Estimated: >60 mL/min (ref >60)
Glucose, Bld: 146 mg/dL — ABNORMAL HIGH (ref 70–99)
Potassium: 3.7 mmol/L (ref 3.5–5.1)
Sodium: 134 mmol/L — ABNORMAL LOW (ref 135–145)
Total Bilirubin: 0.8 mg/dL (ref 0.0–1.2)
Total Protein: 7.3 g/dL (ref 6.5–8.1)

## 2023-08-28 LAB — VITAMIN B12: Vitamin B-12: 287 pg/mL (ref 180–914)

## 2023-08-28 LAB — LACTATE DEHYDROGENASE: LDH: 121 U/L (ref 98–192)

## 2023-08-28 MED ORDER — ONDANSETRON 4 MG PO TBDP
4.0000 mg | ORAL_TABLET | Freq: Once | ORAL | Status: AC
  Administered 2023-08-28: 4 mg via ORAL

## 2023-08-28 NOTE — Progress Notes (Signed)
 Pt trying to get over  a vertigo attack. She is in a wheelchair in consult room 2. She began having nausea . Ondansetron 4 mg disintergrating tablet given.  1500; Pt pushed to car in wheelchair. Feels some better. States she is ready to go home. Brother is driving pt home.

## 2023-08-28 NOTE — Progress Notes (Signed)
 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  Karen Potts 63 y.o.  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   Pt here for thrombocytopenia. Pt had a bout of vertigo 1 week ago.No active bleeding. Appetite is good. Denies pain.   Patient denies any easy bruising or bleeding.No blood in stool.     Having trouble sleeping, cpap, snoring.  Review of Systems  Constitutional:  Positive for malaise/fatigue. Negative for chills, fever and weight loss.  HENT:  Negative for hearing loss, nosebleeds, sore throat and tinnitus.   Eyes:  Negative for blurred vision and double vision.  Respiratory:  Negative for cough, hemoptysis, shortness of breath and wheezing.   Cardiovascular:  Negative for chest pain, palpitations and leg swelling.  Gastrointestinal:  Negative for abdominal pain, blood in stool, constipation, diarrhea, melena, nausea and vomiting.  Genitourinary:  Negative for dysuria and urgency.  Musculoskeletal:  Positive for back pain and joint pain. Negative for falls and myalgias.  Skin:  Negative for itching and rash.  Neurological:  Negative for dizziness, tingling, sensory change, loss of consciousness, weakness and headaches.  Endo/Heme/Allergies:  Negative for environmental allergies. Does not bruise/bleed easily.  Psychiatric/Behavioral:   Negative for depression. The patient is not nervous/anxious and does not have insomnia.      MEDICAL HISTORY:  Past Medical History:  Diagnosis Date   Bulging lumbar disc 2022   Diabetes mellitus without complication (HCC)    Hypertension    MI, old 2019   Sleep apnea    Vertigo     SURGICAL HISTORY: Past Surgical History:  Procedure Laterality Date   ABDOMINAL HYSTERECTOMY     BREAST BIOPSY Left 10/2017    APOCRINE TYPE CYST WITH FIBROSIS Of the Wall   BREAST BIOPSY Left 07/15/2022   Stereo Bx, X Clip, neg   BREAST BIOPSY Left 07/15/2022   MM LT BREAST BX W LOC DEV 1ST LESION IMAGE BX SPEC STEREO GUIDE 07/15/2022 [facility]   COLONOSCOPY WITH PROPOFOL N/A 10/20/2022   Procedure: COLONOSCOPY WITH PROPOFOL;  Surgeon: Wyline Mood, MD;  Location: [facility] ENDOSCOPY;  Service: Gastroenterology;  Laterality: N/A;   CORONARY STENT INTERVENTION N/A 02/24/2018   Procedure: CORONARY STENT INTERVENTION;  Surgeon: Marcina Millard, MD;  Location: [facility] INVASIVE CV LAB;  Service: Cardiovascular;  Laterality: N/A;   HERNIA REPAIR     LEFT HEART CATH AND CORONARY ANGIOGRAPHY Right 02/24/2018   Procedure: LEFT HEART CATH AND CORONARY ANGIOGRAPHY;  Surgeon: Marcina Millard, MD;  Location: [facility] INVASIVE CV LAB;  Service: Cardiovascular;  Laterality: Right;   SHOULDER ARTHROSCOPY WITH OPEN ROTATOR CUFF REPAIR Left 08/14/2017   Procedure: SHOULDER ARTHROSCOPY WITH ROTATOR CUFF REPAIR, BICEPS TENOTOMY, SUBACROMIAL DECOMPRESSION;  Surgeon: Lyndle Herrlich, MD;  Location: [facility] ORS;  Service: Orthopedics;  Laterality: Left;   UMBILICAL HERNIA REPAIR      SOCIAL HISTORY: Social History   Socioeconomic History   Marital status: Married    Spouse name: Not on file   Number  of children: Not on file   Years of education: Not on file   Highest education level: Not on file  Occupational History   Not on file  Tobacco Use   Smoking status: Some Days    Current packs/day: 0.00    Types:  Cigarettes    Last attempt to quit: 02/24/2018    Years since quitting: 5.5   Smokeless tobacco: Never   Tobacco comments:    Sabryn said she quit smoking the day of her heart attack.  Vaping Use   Vaping status: Never Used  Substance and Sexual Activity   Alcohol use: No   Drug use: No   Sexual activity: Yes    Birth control/protection: Condom  Other Topics Concern   Not on file  Social History Narrative   Not working/disability; smoking; no alcohol. In [place]/ lives with a friend.    Social Drivers of Corporate investment banker Strain: Not on file  Food Insecurity: Not on file  Transportation Needs: Not on file  Physical Activity: Not on file  Stress: Not on file  Social Connections: Not on file  Intimate Partner Violence: Not on file    FAMILY HISTORY: Family History  Problem Relation Age of Onset   Breast cancer Neg Hx     ALLERGIES:  has no known allergies.  MEDICATIONS:  Current Outpatient Medications  Medication Sig Dispense Refill   aspirin EC 81 MG tablet Take 81 mg by mouth daily.     atorvastatin (LIPITOR) 80 MG tablet Take 1 tablet (80 mg total) by mouth daily at 6 PM. 30 tablet 0   lisinopril-hydrochlorothiazide (PRINZIDE,ZESTORETIC) 10-12.5 MG tablet TAKE 1 TABLET BY MOUTH ONCE DAILY 90 tablet 1   meclizine (ANTIVERT) 12.5 MG tablet Take 1 tablet (12.5 mg total) by mouth 3 (three) times daily as needed for dizziness or nausea. 30 tablet 1   metFORMIN (GLUCOPHAGE) 500 MG tablet Take 500 mg by mouth daily with breakfast.     nitroGLYCERIN (NITROSTAT) 0.4 MG SL tablet Place 1 tablet (0.4 mg total) under the tongue every 5 (five) minutes as needed for chest pain. 30 tablet 12   triamcinolone cream (KENALOG) 0.1 % Apply topically 2 (two) times daily.     cyclobenzaprine (FLEXERIL) 10 MG tablet Take 10 mg by mouth 2 (two) times daily as needed. (Patient not taking: Reported on 12/15/2021)     loratadine (CLARITIN) 10 MG tablet Take by mouth. (Patient not  taking: Reported on 02/25/2021)     ondansetron (ZOFRAN-ODT) 4 MG disintegrating tablet Take 1 tablet (4 mg total) by mouth every 8 (eight) hours as needed for nausea or vomiting. (Patient not taking: Reported on 08/28/2023) 20 tablet 0   No current facility-administered medications for this visit.     Marland Kitchen  PHYSICAL EXAMINATION:   Vitals:   08/28/23 1333  BP: 120/60  Pulse: 73  Resp: 18  Temp: (!) 97.4 F (36.3 C)  SpO2: 99%    Filed Weights   08/28/23 1333  Weight: 270 lb 12.8 oz (122.8 kg)     Physical Exam HENT:     Head: Normocephalic and atraumatic.     Mouth/Throat:     Pharynx: No oropharyngeal exudate.  Eyes:     Pupils: Pupils are equal, round, and reactive to light.  Cardiovascular:     Rate and Rhythm: Normal rate and regular rhythm.  Pulmonary:     Effort: No respiratory distress.     Breath sounds: No wheezing.  Abdominal:  General: Bowel sounds are normal. There is no distension.     Palpations: Abdomen is soft. There is no mass.     Tenderness: There is no abdominal tenderness. There is no guarding or rebound.  Musculoskeletal:        General: No tenderness. Normal range of motion.     Cervical back: Normal range of motion and neck supple.  Skin:    General: Skin is warm.  Neurological:     Mental Status: She is alert and oriented to person, place, and time.  Psychiatric:        Mood and Affect: Affect normal.      LABORATORY DATA:  I have reviewed the data as listed Lab Results  Component Value Date   WBC 11.5 (H) 08/28/2023   HGB 13.5 08/28/2023   HCT 40.8 08/28/2023   MCV 84.3 08/28/2023   PLT 120 (L) 08/28/2023   Recent Labs    02/25/23 1504 08/21/23 1201 08/28/23 1301  NA 137 138 134*  K 3.5 4.1 3.7  CL 105 103 102  CO2 26 26 24   GLUCOSE 136* 147* 146*  BUN 15 17 18   CREATININE 0.88 0.79 0.77  CALCIUM 9.2 9.1 8.9  GFRNONAA >60 >60 >60  PROT 7.3  --  7.3  ALBUMIN 3.9  --  3.8  AST 25  --  22  ALT 24  --  25  ALKPHOS  67  --  74  BILITOT 0.3  --  0.8     MR BRAIN WO CONTRAST Result Date: 08/21/2023 CLINICAL DATA:  Headache, neuro deficit EXAM: MRI HEAD WITHOUT CONTRAST TECHNIQUE: Multiplanar, multiecho pulse sequences of the brain and surrounding structures were obtained without intravenous contrast. COMPARISON:  CT head from earlier today. FINDINGS: Brain: Mild for age no acute infarction, hemorrhage, hydrocephalus, extra-axial collection or mass lesion. Scattered small T2/FLAIR hyperintensity in the white matter, nonspecific but compatible with chronic microvascular ischemic disease. Vascular: Major arterial flow voids are maintained skull base. Skull and upper cervical spine: Normal marrow signal. Sinuses/Orbits: Retention cysts in the maxillary sinuses. Otherwise, clear sinuses. No acute orbital findings. Other: No mastoid effusions. IMPRESSION: 1. No evidence of acute intracranial abnormality. 2. Mild chronic microvascular ischemic disease. Electronically Signed   By: Feliberto Harts M.D.   On: 08/21/2023 18:12   CT HEAD WO CONTRAST Result Date: 08/21/2023 CLINICAL DATA:  Provided history: Neuro deficit, acute, stroke suspected. EXAM: CT HEAD WITHOUT CONTRAST TECHNIQUE: Contiguous axial images were obtained from the base of the skull through the vertex without intravenous contrast. RADIATION DOSE REDUCTION: This exam was performed according to the departmental dose-optimization program which includes automated exposure control, adjustment of the mA and/or kV according to patient size and/or use of iterative reconstruction technique. COMPARISON:  Head CT 12/15/2021. FINDINGS: Brain: No age-advanced or lobar predominant cerebral atrophy. Chronic lacunar infarct again demonstrated within the genu of the left internal capsule (series 2, image 13). Prominent perivascular space within the inferior right basal ganglia. Patchy and ill-defined hypoattenuation elsewhere within the cerebral white matter, nonspecific but  compatible with mild chronic small vessel ischemic disease. There is no acute intracranial hemorrhage. No demarcated cortical infarct. No extra-axial fluid collection. No evidence of an intracranial mass. No midline shift. Vascular: No hyperdense vessel.  Atherosclerotic calcifications. Skull: No calvarial fracture or aggressive osseous lesion. Sinuses/Orbits: No mass or acute finding within the imaged orbits. Mild mucosal thickening within the right maxillary sinus at the imaged levels. Trace mucosal thickening within the left sphenoid sinus.  Mild mucosal thickening scattered within bilateral ethmoid air cells. Mild mucosal thickening right frontal sinus inferiorly. IMPRESSION: 1. No evidence of an acute intracranial abnormality. 2. Unchanged chronic lacunar infarct within the genu of left internal capsule. 3. Background mild cerebral white matter chronic small vessel ischemic disease. 4. Mild paranasal sinus mucosal thickening at the imaged levels. Electronically Signed   By: Jackey Loge D.O.   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  Platelets today are 120- stable. Asymptomatic.  Would not recommend treatment unless less than 50 or symptomatic./See below- stable.   #Possible knee surgery [s/p cortisone shot; Dr.Bowers]; recommend in follow-up as if she needed hematology clearance; try to lose weight-discussed re: weight loss medications- .  # OSA- awaiting Sleep study-   # CAD/MI- [Aug 2019]- asprin; Effient Jocundus.Bianchi ] [Dr.Parschoes/]-stable  # DISPOSITION: # follow up in 6 month-MD: labs- cbc/cmp;LDH/b12 levels- Dr.B  PCP: community health    Earna Coder, MD 08/28/2023

## 2023-08-28 NOTE — Progress Notes (Signed)
 Pt here for thrombocytopenia. Pt had a bout of vertigo 1 week ago.No active bleeding. Appetite is good. Denies pain. Pt left exam room and got halfway down the hallway and had to sit down due to Vertigo spell. Pt was given 1 of her meclizine tablets while she was sitting on the floor. Her brother was gotten from the waiting room.

## 2023-08-28 NOTE — Assessment & Plan Note (Addendum)
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  Platelets today are 120- stable. Asymptomatic.  Would not recommend treatment unless less than 50 or symptomatic./See below- stable.   #Possible knee surgery [s/p cortisone shot; Dr.Bowers]; recommend in follow-up as if she needed hematology clearance; try to lose weight-discussed re: weight loss medications- .  # OSA- awaiting Sleep study-   # CAD/MI- [Aug 2019]- asprin; Effient Jocundus.Bianchi ] [Dr.Parschoes/]-stable  # DISPOSITION: # follow up in 6 month-MD: labs- cbc/cmp;LDH/b12 levels- Dr.B  PCP: community health

## 2023-10-10 NOTE — Progress Notes (Signed)
 [facility] [address] [place], [place] [address]  Pulmonary Sleep Medicine   Office Visit Note  Patient Name: Karen Potts DOB: 08/08/1960 MRN [id number]    Chief Complaint: Obstructive Sleep Apnea visit  Brief History:  Karen Potts is seen today for 3 month follow up on CPAP @ 12cmH20. The patient has a 6 year history of sleep apnea. Patient is using PAP nightly.  The patient feels rested after sleeping with PAP.  The patient reports benefiting from PAP use. Reported sleepiness is  improved and the Epworth Sleepiness Score is 0 out of 24. The patient does not take naps. The patient complains of the following: none.  The compliance download shows 100% compliance with an average use time of 6 hours 35 minutes. The AHI is 5.1  The patient does not complain of limb movements disrupting sleep.  ROS  General: (-) fever, (-) chills, (-) night sweat Nose and Sinuses: (-) nasal stuffiness or itchiness, (-) postnasal drip, (-) nosebleeds, (-) sinus trouble. Mouth and Throat: (-) sore throat, (-) hoarseness. Neck: (-) swollen glands, (-) enlarged thyroid, (-) neck pain. Respiratory: - cough, - shortness of breath, - wheezing. Neurologic: - numbness, - tingling. Psychiatric: - anxiety, - depression   Current Medication: Outpatient Encounter Medications as of 10/12/2023  Medication Sig   aspirin  EC 81 MG tablet Take 81 mg by mouth daily.   atorvastatin  (LIPITOR) 80 MG tablet Take 1 tablet (80 mg total) by mouth daily at 6 PM.   cyclobenzaprine (FLEXERIL) 10 MG tablet Take 10 mg by mouth 2 (two) times daily as needed. (Patient not taking: Reported on 12/15/2021)   lisinopril -hydrochlorothiazide  (PRINZIDE ,ZESTORETIC ) 10-12.5 MG tablet TAKE 1 TABLET BY MOUTH ONCE DAILY   loratadine (CLARITIN) 10 MG tablet Take by mouth. (Patient not taking: Reported on 02/25/2021)   meclizine  (ANTIVERT ) 12.5 MG tablet Take 1 tablet (12.5 mg total) by mouth 3 (three) times daily as needed for  dizziness or nausea.   metFORMIN (GLUCOPHAGE) 500 MG tablet Take 500 mg by mouth daily with breakfast.   nitroGLYCERIN  (NITROSTAT ) 0.4 MG SL tablet Place 1 tablet (0.4 mg total) under the tongue every 5 (five) minutes as needed for chest pain.   ondansetron  (ZOFRAN -ODT) 4 MG disintegrating tablet Take 1 tablet (4 mg total) by mouth every 8 (eight) hours as needed for nausea or vomiting. (Patient not taking: Reported on 08/28/2023)   triamcinolone cream (KENALOG) 0.1 % Apply topically 2 (two) times daily.   No facility-administered encounter medications on file as of 10/12/2023.    Surgical History: Past Surgical History:  Procedure Laterality Date   ABDOMINAL HYSTERECTOMY     BREAST BIOPSY Left 10/2017    APOCRINE TYPE CYST WITH FIBROSIS Of the Wall   BREAST BIOPSY Left 07/15/2022   Stereo Bx, X Clip, neg   BREAST BIOPSY Left 07/15/2022   MM LT BREAST BX W LOC DEV 1ST LESION IMAGE BX SPEC STEREO GUIDE 07/15/2022 [facility]   COLONOSCOPY WITH PROPOFOL  N/A 10/20/2022   Procedure: COLONOSCOPY WITH PROPOFOL ;  Surgeon: Luke Salaam, MD;  Location: [facility] ENDOSCOPY;  Service: Gastroenterology;  Laterality: N/A;   CORONARY STENT INTERVENTION N/A 02/24/2018   Procedure: CORONARY STENT INTERVENTION;  Surgeon: Percival Brace, MD;  Location: [facility] INVASIVE CV LAB;  Service: Cardiovascular;  Laterality: N/A;   HERNIA REPAIR     LEFT HEART CATH AND CORONARY ANGIOGRAPHY Right 02/24/2018   Procedure: LEFT HEART CATH AND CORONARY ANGIOGRAPHY;  Surgeon: Percival Brace, MD;  Location: [facility] INVASIVE CV LAB;  Service: Cardiovascular;  Laterality: Right;   SHOULDER ARTHROSCOPY WITH OPEN ROTATOR CUFF REPAIR Left 08/14/2017   Procedure: SHOULDER ARTHROSCOPY WITH ROTATOR CUFF REPAIR, BICEPS TENOTOMY, SUBACROMIAL DECOMPRESSION;  Surgeon: Jerlyn Moons, MD;  Location: [facility] ORS;  Service: Orthopedics;  Laterality: Left;   UMBILICAL HERNIA REPAIR      Medical History: Past Medical History:   Diagnosis Date   Bulging lumbar disc 2022   Diabetes mellitus without complication (HCC)    Hypertension    MI, old 2019   Sleep apnea    Vertigo     Family History: Non contributory to the present illness  Social History: Social History   Socioeconomic History   Marital status: Married    Spouse name: Not on file   Number of children: Not on file   Years of education: Not on file   Highest education level: Not on file  Occupational History   Not on file  Tobacco Use   Smoking status: Some Days    Current packs/day: 0.00    Types: Cigarettes    Last attempt to quit: 02/24/2018    Years since quitting: 5.6   Smokeless tobacco: Never   Tobacco comments:    Karen Potts said she quit smoking the day of her heart attack.  Vaping Use   Vaping status: Never Used  Substance and Sexual Activity   Alcohol use: No   Drug use: No   Sexual activity: Yes    Birth control/protection: Condom  Other Topics Concern   Not on file  Social History Narrative   Not working/disability; smoking; no alcohol. In [place]/ lives with a friend.    Social Drivers of Corporate investment banker Strain: Not on file  Food Insecurity: Not on file  Transportation Needs: Not on file  Physical Activity: Not on file  Stress: Not on file  Social Connections: Not on file  Intimate Partner Violence: Not on file    Vital Signs: Blood pressure (!) 152/81, pulse 74, resp. rate 16, height 5\' 7"  (1.702 m), weight 280 lb (127 kg), SpO2 99%. Body mass index is 43.85 kg/m.    Examination: General Appearance: The patient is well-developed, well-nourished, and in no distress. Neck Circumference: 40cm Skin: Gross inspection of skin unremarkable. Head: normocephalic, no gross deformities. Eyes: no gross deformities noted. ENT: ears appear grossly normal Neurologic: Alert and oriented. No involuntary movements.  STOP BANG RISK ASSESSMENT S (snore) Have you been told that you snore?     NO   T  (tired) Are you often tired, fatigued, or sleepy during the day?   NO  O (obstruction) Do you stop breathing, choke, or gasp during sleep? NO   P (pressure) Do you have or are you being treated for high blood pressure? YES   B (BMI) Is your body index greater than 35 kg/m? YES   A (age) Are you 46 years old or older? YES   N (neck) Do you have a neck circumference greater than 16 inches?   NO   G (gender) Are you a female? NO   TOTAL STOP/BANG "YES" ANSWERS 3       A STOP-Bang score of 2 or less is considered low risk, and a score of 5 or more is high risk for having either moderate or severe OSA. For people who score 3 or 4, doctors may need to perform further assessment to determine how likely they are to have OSA.         EPWORTH  SLEEPINESS SCALE:  Scale:  (0)= no chance of dozing; (1)= slight chance of dozing; (2)= moderate chance of dozing; (3)= high chance of dozing  Chance  Situtation    Sitting and reading: 0    Watching TV: 0    Sitting Inactive in public: 0    As a passenger in car: 0      Lying down to rest: 0    Sitting and talking: 0    Sitting quielty after lunch: 0    In a car, stopped in traffic: 0   TOTAL SCORE:   0 out of 24    SLEEP STUDIES:  PSG - 03/2023 - AHI 12.3/hr, Supine AHI 41/hr, REM AHI 21.4/hr, min Sp02 86% Titration - 03/2023 - CPAP @ 12cmH20   CPAP COMPLIANCE DATA:  Date Range: 07/12/2023 - 10/09/2023  Average Daily Use: 6 hours 35 minutes  Median Use: 6 hours 29 minutes  Compliance for > 4 Hours: 100% days  AHI: 5.1 respiratory events per hour  Days Used: 90/90  Mask Leak: 90.5  95th Percentile Pressure: 12cmH20         LABS: Recent Results (from the past 2160 hours)  Basic metabolic panel     Status: Abnormal   Collection Time: 08/21/23 12:01 PM  Result Value Ref Range   Sodium 138 135 - 145 mmol/L   Potassium 4.1 3.5 - 5.1 mmol/L   Chloride 103 98 - 111 mmol/L   CO2 26 22 - 32 mmol/L   Glucose, Bld  147 (H) 70 - 99 mg/dL    Comment: Glucose reference range applies only to samples taken after fasting for at least 8 hours.   BUN 17 8 - 23 mg/dL   Creatinine, Ser 5.40 0.44 - 1.00 mg/dL   Calcium  9.1 8.9 - 10.3 mg/dL   GFR, Estimated >98 >11 mL/min    Comment: (NOTE) Calculated using the CKD-EPI Creatinine Equation (2021)    Anion gap 9 5 - 15    Comment: Performed at [facility], [address] [place], [place] [address]  CBC     Status: Abnormal   Collection Time: 08/21/23 12:01 PM  Result Value Ref Range   WBC 12.5 (H) 4.0 - 10.5 K/uL   RBC 4.81 3.87 - 5.11 MIL/uL   Hemoglobin 13.4 12.0 - 15.0 g/dL   HCT 29.5 62.1 - 30.8 %   MCV 85.2 80.0 - 100.0 fL   MCH 27.9 26.0 - 34.0 pg   MCHC 32.7 30.0 - 36.0 g/dL   RDW 65.7 84.6 - 96.2 %   Platelets 136 (L) 150 - 400 K/uL   nRBC 0.0 0.0 - 0.2 %    Comment: Performed at [facility], [address], [place], [place] [address]  Troponin I (High Sensitivity)     Status: None   Collection Time: 08/21/23 12:01 PM  Result Value Ref Range   Troponin I (High Sensitivity) 3 <18 ng/L    Comment: (NOTE) Elevated high sensitivity troponin I (hsTnI) values and significant  changes across serial measurements may suggest ACS but many other  chronic and acute conditions are known to elevate hsTnI results.  Refer to the "Links" section for chest pain algorithms and additional  guidance. Performed at [facility], [address] [place], [place] [address]   Urinalysis, Routine w reflex microscopic -Urine, Clean Catch     Status: Abnormal   Collection Time: 08/21/23 12:01 PM  Result Value Ref Range   Color, Urine YELLOW (  A) YELLOW   APPearance HAZY (A) CLEAR   Specific Gravity, Urine 1.025 1.005 - 1.030   pH 5.0 5.0 - 8.0   Glucose, UA NEGATIVE NEGATIVE mg/dL   Hgb urine dipstick NEGATIVE NEGATIVE   Bilirubin Urine NEGATIVE NEGATIVE   Ketones, ur NEGATIVE NEGATIVE mg/dL   Protein, ur NEGATIVE NEGATIVE mg/dL    Nitrite NEGATIVE NEGATIVE   Leukocytes,Ua NEGATIVE NEGATIVE    Comment: Performed at [facility], [address], [place], [place] [address]  Resp panel by RT-PCR (RSV, Flu A&B, Covid) Anterior Nasal Swab     Status: None   Collection Time: 08/21/23  3:21 PM   Specimen: Anterior Nasal Swab  Result Value Ref Range   SARS Coronavirus 2 by RT PCR NEGATIVE NEGATIVE    Comment: (NOTE) SARS-CoV-2 target nucleic acids are NOT DETECTED.  The SARS-CoV-2 RNA is generally detectable in upper respiratory specimens during the acute phase of infection. The lowest concentration of SARS-CoV-2 viral copies this assay can detect is 138 copies/mL. A negative result does not preclude SARS-Cov-2 infection and should not be used as the sole basis for treatment or other patient management decisions. A negative result may occur with  improper specimen collection/handling, submission of specimen other than nasopharyngeal swab, presence of viral mutation(s) within the areas targeted by this assay, and inadequate number of viral copies(<138 copies/mL). A negative result must be combined with clinical observations, patient history, and epidemiological information. The expected result is Negative.  Fact Sheet for Patients:  BloggerCourse.com  Fact Sheet for Healthcare Providers:  SeriousBroker.it  This test is no t yet approved or cleared by the [place]  FDA and  has been authorized for detection and/or diagnosis of SARS-CoV-2 by FDA under an Emergency Use Authorization (EUA). This EUA will remain  in effect (meaning this test can be used) for the duration of the COVID-19 declaration under Section 564(b)(1) of the Act, 21 U.S.C.section 360bbb-3(b)(1), unless the authorization is terminated  or revoked sooner.       Influenza A by PCR NEGATIVE NEGATIVE   Influenza B by PCR NEGATIVE NEGATIVE    Comment: (NOTE) The Xpert Xpress  SARS-CoV-2/FLU/RSV plus assay is intended as an aid in the diagnosis of influenza from Nasopharyngeal swab specimens and should not be used as a sole basis for treatment. Nasal washings and aspirates are unacceptable for Xpert Xpress SARS-CoV-2/FLU/RSV testing.  Fact Sheet for Patients: BloggerCourse.com  Fact Sheet for Healthcare Providers: SeriousBroker.it  This test is not yet approved or cleared by the [place]  FDA and has been authorized for detection and/or diagnosis of SARS-CoV-2 by FDA under an Emergency Use Authorization (EUA). This EUA will remain in effect (meaning this test can be used) for the duration of the COVID-19 declaration under Section 564(b)(1) of the Act, 21 U.S.C. section 360bbb-3(b)(1), unless the authorization is terminated or revoked.     Resp Syncytial Virus by PCR NEGATIVE NEGATIVE    Comment: (NOTE) Fact Sheet for Patients: BloggerCourse.com  Fact Sheet for Healthcare Providers: SeriousBroker.it  This test is not yet approved or cleared by the [place]  FDA and has been authorized for detection and/or diagnosis of SARS-CoV-2 by FDA under an Emergency Use Authorization (EUA). This EUA will remain in effect (meaning this test can be used) for the duration of the COVID-19 declaration under Section 564(b)(1) of the Act, 21 U.S.C. section 360bbb-3(b)(1), unless the authorization is terminated or revoked.  Performed at [facility], [address], [place], [place] [address]  Vitamin B12     Status: None   Collection Time: 08/28/23  1:01 PM  Result Value Ref Range   Vitamin B-12 287 180 - 914 pg/mL    Comment: (NOTE) This assay is not validated for testing neonatal or myeloproliferative syndrome specimens for Vitamin B12 levels. Performed at [facility], [address], [place], [place] [address]   Lactate dehydrogenase      Status: None   Collection Time: 08/28/23  1:01 PM  Result Value Ref Range   LDH 121 98 - 192 U/L    Comment: Performed at [facility], [address] [place], [place] [address]  CMP (Cancer Center only)     Status: Abnormal   Collection Time: 08/28/23  1:01 PM  Result Value Ref Range   Sodium 134 (L) 135 - 145 mmol/L   Potassium 3.7 3.5 - 5.1 mmol/L   Chloride 102 98 - 111 mmol/L   CO2 24 22 - 32 mmol/L   Glucose, Bld 146 (H) 70 - 99 mg/dL    Comment: Glucose reference range applies only to samples taken after fasting for at least 8 hours.   BUN 18 8 - 23 mg/dL   Creatinine 5.63 8.75 - 1.00 mg/dL   Calcium  8.9 8.9 - 10.3 mg/dL   Total Protein 7.3 6.5 - 8.1 g/dL   Albumin 3.8 3.5 - 5.0 g/dL   AST 22 15 - 41 U/L   ALT 25 0 - 44 U/L   Alkaline Phosphatase 74 38 - 126 U/L   Total Bilirubin 0.8 0.0 - 1.2 mg/dL   GFR, Estimated >64 >33 mL/min    Comment: (NOTE) Calculated using the CKD-EPI Creatinine Equation (2021)    Anion gap 8 5 - 15    Comment: Performed at [facility], [address] [place], [place] [address]  CBC with Differential (Cancer Center Only)     Status: Abnormal   Collection Time: 08/28/23  1:01 PM  Result Value Ref Range   WBC Count 11.5 (H) 4.0 - 10.5 K/uL   RBC 4.84 3.87 - 5.11 MIL/uL   Hemoglobin 13.5 12.0 - 15.0 g/dL   HCT 84.1 66.0 - 63.0 %   MCV 84.3 80.0 - 100.0 fL   MCH 27.9 26.0 - 34.0 pg   MCHC 33.1 30.0 - 36.0 g/dL   RDW 16.0 10.9 - 32.3 %   Platelet Count 120 (L) 150 - 400 K/uL   nRBC 0.0 0.0 - 0.2 %   Neutrophils Relative % 50 %   Neutro Abs 5.8 1.7 - 7.7 K/uL   Lymphocytes Relative 42 %   Lymphs Abs 4.8 (H) 0.7 - 4.0 K/uL   Monocytes Relative 6 %   Monocytes Absolute 0.6 0.1 - 1.0 K/uL   Eosinophils Relative 2 %   Eosinophils Absolute 0.2 0.0 - 0.5 K/uL   Basophils Relative 0 %   Basophils Absolute 0.1 0.0 - 0.1 K/uL   WBC Morphology UNREMARKABLE    RBC Morphology UNREMARKABLE    Smear Review Normal platelet  morphology     Comment: PLATELETS APPEAR DECREASED OCC LARGE PLATELETS NOTED.    Immature Granulocytes 0 %   Abs Immature Granulocytes 0.04 0.00 - 0.07 K/uL    Comment: Performed at [facility], [address] [place], [place] [address]    Radiology: MR BRAIN WO CONTRAST Result Date: 08/21/2023 CLINICAL DATA:  Headache, neuro deficit EXAM: MRI HEAD WITHOUT CONTRAST TECHNIQUE: Multiplanar, multiecho pulse sequences of the brain and  surrounding structures were obtained without intravenous contrast. COMPARISON:  CT head from earlier today. FINDINGS: Brain: Mild for age no acute infarction, hemorrhage, hydrocephalus, extra-axial collection or mass lesion. Scattered small T2/FLAIR hyperintensity in the white matter, nonspecific but compatible with chronic microvascular ischemic disease. Vascular: Major arterial flow voids are maintained skull base. Skull and upper cervical spine: Normal marrow signal. Sinuses/Orbits: Retention cysts in the maxillary sinuses. Otherwise, clear sinuses. No acute orbital findings. Other: No mastoid effusions. IMPRESSION: 1. No evidence of acute intracranial abnormality. 2. Mild chronic microvascular ischemic disease. Electronically Signed   By: Stevenson Elbe M.D.   On: 08/21/2023 18:12   CT HEAD WO CONTRAST Result Date: 08/21/2023 CLINICAL DATA:  Provided history: Neuro deficit, acute, stroke suspected. EXAM: CT HEAD WITHOUT CONTRAST TECHNIQUE: Contiguous axial images were obtained from the base of the skull through the vertex without intravenous contrast. RADIATION DOSE REDUCTION: This exam was performed according to the departmental dose-optimization program which includes automated exposure control, adjustment of the mA and/or kV according to patient size and/or use of iterative reconstruction technique. COMPARISON:  Head CT 12/15/2021. FINDINGS: Brain: No age-advanced or lobar predominant cerebral atrophy. Chronic lacunar infarct again demonstrated within the  genu of the left internal capsule (series 2, image 13). Prominent perivascular space within the inferior right basal ganglia. Patchy and ill-defined hypoattenuation elsewhere within the cerebral white matter, nonspecific but compatible with mild chronic small vessel ischemic disease. There is no acute intracranial hemorrhage. No demarcated cortical infarct. No extra-axial fluid collection. No evidence of an intracranial mass. No midline shift. Vascular: No hyperdense vessel.  Atherosclerotic calcifications. Skull: No calvarial fracture or aggressive osseous lesion. Sinuses/Orbits: No mass or acute finding within the imaged orbits. Mild mucosal thickening within the right maxillary sinus at the imaged levels. Trace mucosal thickening within the left sphenoid sinus. Mild mucosal thickening scattered within bilateral ethmoid air cells. Mild mucosal thickening right frontal sinus inferiorly. IMPRESSION: 1. No evidence of an acute intracranial abnormality. 2. Unchanged chronic lacunar infarct within the genu of left internal capsule. 3. Background mild cerebral white matter chronic small vessel ischemic disease. 4. Mild paranasal sinus mucosal thickening at the imaged levels. Electronically Signed   By: Bascom Lily D.O.   On: 08/21/2023 13:42    No results found.  No results found.    Assessment and Plan: Patient Active Problem List   Diagnosis Date Noted   OSA (obstructive sleep apnea) 07/13/2023   CPAP use counseling 07/13/2023   Obesity, morbid (HCC) 07/13/2023   Encounter for screening colonoscopy 10/20/2022   Adenomatous polyp of colon 10/20/2022   Morbid obesity (HCC) 09/24/2022   Heart palpitations 07/24/2021   Tinnitus of right ear 08/07/2020   Vertigo 08/07/2020   Difficulty walking 04/18/2020   Numbness and tingling 03/22/2020   Obstructive sleep apnea syndrome 01/18/2020   Thrombocytopenia (HCC) 04/08/2019   HTN (hypertension) 07/05/2018   Tobacco use 07/05/2018   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 OSA (obstructive sleep apnea) (Primary) The patient does tolerate PAP and reports  benefit from PAP use. The patient was reminded how to clean equipment and advised to replace supplies routinely. The patient was also counselled on weight loss. The compliance is excellent. The AHI is 5.1 (elevated due to mask leak)   OSA on cpap- controlled. Continue with  excellent compliance with pap. CPAP continues to be medically necessary to treat this patient's OSA. F/u one year.    2. CPAP use counseling CPAP Counseling: had a lengthy discussion with the patient regarding the importance of PAP therapy in management of the sleep apnea. Patient appears to understand the risk factor reduction and also understands the risks associated with untreated sleep apnea. Patient will try to make a good faith effort to remain compliant with therapy. Also instructed the patient on proper cleaning of the device including the water must be changed daily if possible and use of distilled water is preferred. Patient understands that the machine should be regularly cleaned with appropriate recommended cleaning solutions that do not damage the PAP machine for example given white vinegar and water rinses. Other methods such as ozone treatment may not be as good as these simple methods to achieve cleaning.   3. Obesity, morbid (HCC) Obesity Counseling: Had a lengthy discussion regarding patients BMI and weight issues. Patient was instructed on portion control as well as increased activity. Also discussed caloric restrictions with trying to maintain intake less than 2000 Kcal. Discussions were made in accordance with the 5As of weight management. Simple actions such as not eating late and if able to, taking a walk is  suggested.     General Counseling: I have discussed the findings of the evaluation and examination with Karen Potts.  I have also discussed any further diagnostic evaluation thatmay be needed or ordered today. Karen Potts verbalizes understanding of the findings of todays visit. We also reviewed her medications today and discussed drug interactions and side effects including but not limited excessive drowsiness and altered mental states. We also discussed that there is always a risk not just to her but also people around her. she has been encouraged to call the office with any questions or concerns that should arise related to todays visit.  No orders of the defined types were placed in this encounter.       I have personally obtained a history, examined the patient, evaluated laboratory and imaging results, formulated the assessment and plan and placed orders. This patient was seen today by Louann Rous, PA-C in collaboration with Dr. Cam Cava.   Cordie Deters, MD [facility] Diplomate ABMS Pulmonary Critical Care Medicine and Sleep Medicine

## 2023-10-12 ENCOUNTER — Ambulatory Visit (INDEPENDENT_AMBULATORY_CARE_PROVIDER_SITE_OTHER): Admitting: Internal Medicine

## 2023-10-12 VITALS — BP 152/81 | HR 74 | Resp 16 | Ht 67.0 in | Wt 280.0 lb

## 2023-10-12 DIAGNOSIS — G4733 Obstructive sleep apnea (adult) (pediatric): Secondary | ICD-10-CM

## 2023-10-12 DIAGNOSIS — Z7189 Other specified counseling: Secondary | ICD-10-CM | POA: Diagnosis not present

## 2023-10-12 NOTE — Patient Instructions (Signed)

## 2023-11-11 ENCOUNTER — Ambulatory Visit: Attending: Nurse Practitioner | Admitting: Physical Therapy

## 2023-11-11 ENCOUNTER — Encounter: Payer: Self-pay | Admitting: Physical Therapy

## 2023-11-11 ENCOUNTER — Other Ambulatory Visit: Payer: Self-pay

## 2023-11-11 DIAGNOSIS — R2681 Unsteadiness on feet: Secondary | ICD-10-CM | POA: Insufficient documentation

## 2023-11-11 DIAGNOSIS — R42 Dizziness and giddiness: Secondary | ICD-10-CM | POA: Diagnosis present

## 2023-11-11 NOTE — Therapy (Signed)
 OUTPATIENT PHYSICAL THERAPY VESTIBULAR EVALUATION     Patient Name: Karen Potts MRN: [id number] DOB:Aug 05, 1960, 63 y.o., female Today's Date: 11/11/2023  END OF SESSION:   PT End of Session - 11/11/23 1621     Visit Number 1    Number of Visits 24    Date for PT Re-Evaluation 02/03/24    PT Start Time 1615    PT Stop Time 1710    PT Time Calculation (min) 55 min    Activity Tolerance Patient tolerated treatment well;Other (comment)   pt experiencing nausea following repositioning manuever   Behavior During Therapy [facility] for tasks assessed/performed             Past Medical History:  Diagnosis Date   Bulging lumbar disc 2022   Diabetes mellitus without complication (HCC)    Hypertension    MI, old 2019   Sleep apnea    Vertigo    Past Surgical History:  Procedure Laterality Date   ABDOMINAL HYSTERECTOMY     BREAST BIOPSY Left 10/2017    APOCRINE TYPE CYST WITH FIBROSIS Of the Wall   BREAST BIOPSY Left 07/15/2022   Stereo Bx, X Clip, neg   BREAST BIOPSY Left 07/15/2022   MM LT BREAST BX W LOC DEV 1ST LESION IMAGE BX SPEC STEREO GUIDE 07/15/2022 [facility]   COLONOSCOPY WITH PROPOFOL  N/A 10/20/2022   Procedure: COLONOSCOPY WITH PROPOFOL ;  Surgeon: Luke Salaam, MD;  Location: [facility] ENDOSCOPY;  Service: Gastroenterology;  Laterality: N/A;   CORONARY STENT INTERVENTION N/A 02/24/2018   Procedure: CORONARY STENT INTERVENTION;  Surgeon: Percival Brace, MD;  Location: [facility] INVASIVE CV LAB;  Service: Cardiovascular;  Laterality: N/A;   HERNIA REPAIR     LEFT HEART CATH AND CORONARY ANGIOGRAPHY Right 02/24/2018   Procedure: LEFT HEART CATH AND CORONARY ANGIOGRAPHY;  Surgeon: Percival Brace, MD;  Location: [facility] INVASIVE CV LAB;  Service: Cardiovascular;  Laterality: Right;   SHOULDER ARTHROSCOPY WITH OPEN ROTATOR CUFF REPAIR Left 08/14/2017   Procedure: SHOULDER ARTHROSCOPY WITH ROTATOR CUFF REPAIR, BICEPS TENOTOMY, SUBACROMIAL DECOMPRESSION;  Surgeon:  Jerlyn Moons, MD;  Location: [facility] ORS;  Service: Orthopedics;  Laterality: Left;   UMBILICAL HERNIA REPAIR     Patient Active Problem List   Diagnosis Date Noted   OSA (obstructive sleep apnea) 07/13/2023   CPAP use counseling 07/13/2023   Obesity, morbid (HCC) 07/13/2023   Encounter for screening colonoscopy 10/20/2022   Adenomatous polyp of colon 10/20/2022   Morbid obesity (HCC) 09/24/2022   Heart palpitations 07/24/2021   Tinnitus of right ear 08/07/2020   Vertigo 08/07/2020   Difficulty walking 04/18/2020   Numbness and tingling 03/22/2020   Obstructive sleep apnea syndrome 01/18/2020   Thrombocytopenia (HCC) 04/08/2019   HTN (hypertension) 07/05/2018   Tobacco use 07/05/2018   S/P drug eluting coronary stent placement 03/15/2018   Unstable angina (HCC) 02/24/2018   NSTEMI (non-ST elevated myocardial infarction) (HCC) 02/24/2018   Full thickness rotator cuff tear 07/31/2017   Periumbilical hernia 06/06/2014    PCP: [facility]  REFERRING PROVIDER: Evette Hoes, NP   REFERRING DIAG: H81.10 (ICD-10-CM) - Benign paroxysmal vertigo, unspecified ear   THERAPY DIAG: Dizziness and giddiness  Unsteadiness on feet  ONSET DATE: started in 2019 with exacerbation starting in December 2024 with ED visit February 2025  Rationale for Evaluation and Treatment: Rehabilitation  SUBJECTIVE:   SUBJECTIVE STATEMENT:  Pt states her vertigo started in February of 2019 following a fall. Pt states she was previously treated for vertigo in  this clinic in ~3 years ago. Pt reports she had onset of vertigo in December and then had a stronger exacerbation causing her to go to ED on 08/21/2023 due to vertigo that was exacerbated by turning to the R. When pt went to ED she had 3 days of headache with nausea and vomiting with onset of dizziness and blurred vision.   Pt accompanied by: friend  PERTINENT HISTORY:  PMH: NSTEMI, hypertension, vertigo, Type 2 diabetes,  Hyperlipidemia, sleep apnea on CPAP  Recent ED visit on 08/21/2023 with MD note:  "63 year old female presenting to the emergency department for treatment and evaluation of 3 days of headache with nausea and vomiting with onset of dizziness and blurred vision this morning.  See HPI for further details.   While awaiting ER room assignment, labs were obtained.  She has a mild leukocytosis at 12.5.  BMP shows glucose of 147.  Initial troponin is normal at 3.  EKG is reassuring.   On exam, the patient has no focal weakness.  She states that if she lays with her head turned to the left that the headache and blurred vision improves.  If she turns her head to the right or sits up it gets worse.  She has not yet taken anything for the headache today.  Tylenol  ordered.  Respiratory panel ordered. MRI is negative for acute abnormality "  PAIN:  Are you having pain? No  PRECAUTIONS: None  RED FLAGS: None   WEIGHT BEARING RESTRICTIONS: No  FALLS: Has patient fallen in last 6 months? Yes. 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  DIAGNOSTIC FINDINGS:   EXAM: MRI HEAD WITHOUT CONTRAST   IMPRESSION: 1. No evidence of acute intracranial abnormality. 2. Mild chronic microvascular ischemic disease.   Electronically Signed   By: Stevenson Elbe M.D.   On: 08/21/2023  18:12  COGNITION: Overall cognitive status: Within functional limits for tasks assessed   SENSATION: WFL  EDEMA:  Not formally assessed  MUSCLE TONE:  Not formally assessed  DTRs:  Not formally assessed  POSTURE:  rounded shoulders and forward head  Cervical ROM:    Active  WFL flexion/extension and rotation in sitting A/PROM (deg) eval  Flexion   Extension   Right lateral flexion   Left lateral flexion   Right rotation   Left rotation   (Blank rows = not tested)  STRENGTH:   LOWER EXTREMITY MMT:   MMT  NOT FORMALLY ASSESSED, WFL for mobility tasks observed Right eval Left eval  Hip flexion    Hip abduction    Hip adduction    Hip internal rotation    Hip external rotation    Knee flexion    Knee extension    Ankle dorsiflexion    Ankle plantarflexion    Ankle inversion    Ankle eversion    (Blank rows = not tested)  BED MOBILITY:  Sit to supine Modified independence Supine  to sit Modified independence  TRANSFERS: Assistive device utilized: None  Sit to stand: Complete Independence Stand to sit: Complete Independence Chair to chair: Complete Independence Floor: not assessed  RAMP: not assessed  CURB: not assessed  GAIT: Gait pattern: WFL and step through pattern Distance walked: clinic distance Assistive device utilized: None Level of assistance: Complete Independence Comments: would benefit from testing of dynamic gait challenges  PATIENT SURVEYS:  DHI 30 indicating mild handicap 16-34 Points (mild handicap)  36-52 Points (moderate handicap)  54+ Points (severe handicap)  VESTIBULAR ASSESSMENT:   SYMPTOM BEHAVIOR:  Subjective history: Pt states she sleeps on her L side because when she turns her head to R it causes vertigo. States bending forward causes her symptoms. Pt states she has had onset of vertigo during a massage in the past. Pt reports she has had a constant low tone ringing in R ear since 2019.   Non-Vestibular  symptoms: tinnitus (constant low tone), nausea/vomiting  Type of dizziness: Imbalance (Disequilibrium), Spinning/Vertigo, Unsteady with head/body turns, Lightheadedness/Faint, and "World moves"  Frequency: pt tries to avoid head movements that will trigger it, but states happens roughly 1x/day when transitioning supine<>sit  Duration: "a few seconds"   Aggravating factors: Induced by position change: supine to sit and Induced by motion: bending down to the ground, turning body quickly, and turning head quickly  Relieving factors: head stationary, rest, slow movements, and pt has learned to modify her movements to avoid onset of symptoms  Progression of symptoms: not as severe as they were in February 2025  OCULOMOTOR EXAM:  Ocular Alignment: normal  Ocular ROM: No Limitations  Spontaneous Nystagmus: absent  Gaze-Induced Nystagmus: absent  Smooth Pursuits: intact and some extra eye movements when moving at a diagonal, but WNL  Saccades: intact  Convergence/Divergence: WFL Cover/Un-cover: WNL   VESTIBULAR - OCULAR REFLEX:   Slow VOR: Normal  VOR Cancellation: Corrective Saccades in both directions with pt reporting slight symptoms with this   Head-Impulse Test: HIT Right: positive HIT Left: positive  Dynamic Visual Acuity: Not able to be assessed   POSITIONAL TESTING: Loaded Right Dix-Hallpike: upbeating, right nystagmus, with pt very symptomatic and strong nystagmus, and Duration: ~30 seconds    OTHOSTATICS: not done  FUNCTIONAL GAIT: Functional gait assessment: would benefit from testing                                                                                                                             TREATMENT DATE: 11/11/2023  Canalith Repositioning: Canalith Repositioning Maneuver (CRM) x1 for R posterior canal BPPV with pt having nausea following but no emesis production - provided cool wash cloth Due to feeling nauseous unable to repeat positional test at this  time  Provided pt with ANPT BPPV and After BPPV Maneuver handout.  PATIENT EDUCATION: Education details: Educated on Geographical information systems officer and findings today, Educated on vestibular treatment, Provided hand-out on what to do following repositioning maneuver Person educated: Patient and Friend  Education method: Chief Technology Officer Education comprehension: verbalized understanding and needs further education  HOME EXERCISE PROGRAM:  GOALS: Goals reviewed with patient? Yes  SHORT TERM GOALS: Target date: 12/23/2023  Patient will be independent in home exercise program to improve strength/mobility and/or vertigo symptoms for better functional independence with ADLs.  Baseline: need to initiate Goal status: INITIAL   LONG TERM GOALS: Target date: 02/03/2024  Patient will reduce dizziness handicap inventory score to <30, for less dizziness with ADLs and increased safety with home and work tasks.  Baseline: 30 Goal status: INITIAL  2.  Patient will increase Functional Gait Assessment (FGA) score to >20/30 as to reduce fall risk and improve dynamic gait safety with community ambulation.  Baseline: need to assess Goal status: INITIAL  3.  Patient will report having <2 episode of dizziness in a week following treatment. Baseline: currently having 1x/daily Goal status: INITIAL  4.  Patient will have negative testing for positional vertigo. Baseline: positive R Dix-Hallpike  Goal status: INITIAL   ASSESSMENT:  CLINICAL IMPRESSION: Patient is a 63 y.o. female who was seen today for physical therapy evaluation and treatment for vertigo/dizziness with imbalance that started in December 2024 with exacerbation in February 2025. Patient found to have positive HIT bilaterally as well as reporting feeling symptoms during VOR cancellation. Patient with positive R Loaded Dix-Hallpike with R upbeating torsional nystagmus with strong symptom response and was treated with 1x CRM. Pt became nauseous  following repositioning maneuver and was therefore unable to complete re-peat positional testing this date. Ms.Porath will benefit from further skilled PT to improve these deficits in order to decrease her dizziness/vertigo, increase QOL, and ease/safety with ADLs and functional mobility to maintain independent level.  OBJECTIVE IMPAIRMENTS: decreased activity tolerance, decreased balance, decreased knowledge of condition, decreased mobility, difficulty walking, and dizziness.   ACTIVITY LIMITATIONS: lifting, bending, squatting, sleeping, bed mobility, reach over head, and locomotion level  PARTICIPATION LIMITATIONS: cleaning, laundry, and community activity  PERSONAL FACTORS: Past/current experiences, Sex, Time since onset of injury/illness/exacerbation, and 3+ comorbidities: NSTEMI, hypertension, vertigo, Type 2 diabetes, Hyperlipidemia, sleep apnea on CPAP are also affecting patient's functional outcome.   REHAB POTENTIAL: Excellent  CLINICAL DECISION MAKING: Evolving/moderate complexity  EVALUATION COMPLEXITY: Moderate   PLAN:  PT FREQUENCY: 1-2x/week  PT DURATION: 12 weeks  PLANNED INTERVENTIONS: 97164- PT Re-evaluation, 97750- Physical Performance Testing, 97110-Therapeutic exercises, 97530- Therapeutic activity, 97112- Neuromuscular re-education, 97535- Self Care, [id number]- Manual therapy, [id number]- Gait training, [phone]- Canalith repositioning, Patient/Family education, Balance training, Stair training, Taping, Dry Needling, Joint mobilization, Spinal mobilization, Vestibular training, Visual/preceptual remediation/compensation, Cryotherapy, Moist heat, and Biofeedback  PLAN FOR NEXT SESSION:  - repeat R Dix Hallpike Test and treat - perform additional positional testing as indicated - FGA  - when appropriate re-assess symptoms with VOR Cancellation    Kimiko Common, PT, DPT, NCS, CSRS Physical Therapist - [facility]  5:36 PM 11/11/23

## 2023-11-16 NOTE — Therapy (Signed)
 OUTPATIENT PHYSICAL THERAPY VESTIBULAR TREATMENT     Patient Name: Karen Potts MRN: [id number] DOB:May 02, 1961, 63 y.o., female Today's Date: 11/17/2023  END OF SESSION:   PT End of Session - 11/17/23 0935     Visit Number 2    Number of Visits 24    Date for PT Re-Evaluation 02/03/24    PT Start Time 0935    PT Stop Time 1019    PT Time Calculation (min) 44 min    Activity Tolerance Patient tolerated treatment well    Behavior During Therapy [facility] for tasks assessed/performed              Past Medical History:  Diagnosis Date   Bulging lumbar disc 2022   Diabetes mellitus without complication (HCC)    Hypertension    MI, old 2019   Sleep apnea    Vertigo    Past Surgical History:  Procedure Laterality Date   ABDOMINAL HYSTERECTOMY     BREAST BIOPSY Left 10/2017    APOCRINE TYPE CYST WITH FIBROSIS Of the Wall   BREAST BIOPSY Left 07/15/2022   Stereo Bx, X Clip, neg   BREAST BIOPSY Left 07/15/2022   MM LT BREAST BX W LOC DEV 1ST LESION IMAGE BX SPEC STEREO GUIDE 07/15/2022 [facility]   COLONOSCOPY WITH PROPOFOL  N/A 10/20/2022   Procedure: COLONOSCOPY WITH PROPOFOL ;  Surgeon: Luke Salaam, MD;  Location: [facility] ENDOSCOPY;  Service: Gastroenterology;  Laterality: N/A;   CORONARY STENT INTERVENTION N/A 02/24/2018   Procedure: CORONARY STENT INTERVENTION;  Surgeon: Percival Brace, MD;  Location: [facility] INVASIVE CV LAB;  Service: Cardiovascular;  Laterality: N/A;   HERNIA REPAIR     LEFT HEART CATH AND CORONARY ANGIOGRAPHY Right 02/24/2018   Procedure: LEFT HEART CATH AND CORONARY ANGIOGRAPHY;  Surgeon: Percival Brace, MD;  Location: [facility] INVASIVE CV LAB;  Service: Cardiovascular;  Laterality: Right;   SHOULDER ARTHROSCOPY WITH OPEN ROTATOR CUFF REPAIR Left 08/14/2017   Procedure: SHOULDER ARTHROSCOPY WITH ROTATOR CUFF REPAIR, BICEPS TENOTOMY, SUBACROMIAL DECOMPRESSION;  Surgeon: Jerlyn Moons, MD;  Location: [facility] ORS;  Service: Orthopedics;   Laterality: Left;   UMBILICAL HERNIA REPAIR     Patient Active Problem List   Diagnosis Date Noted   OSA (obstructive sleep apnea) 07/13/2023   CPAP use counseling 07/13/2023   Obesity, morbid (HCC) 07/13/2023   Encounter for screening colonoscopy 10/20/2022   Adenomatous polyp of colon 10/20/2022   Morbid obesity (HCC) 09/24/2022   Heart palpitations 07/24/2021   Tinnitus of right ear 08/07/2020   Vertigo 08/07/2020   Difficulty walking 04/18/2020   Numbness and tingling 03/22/2020   Obstructive sleep apnea syndrome 01/18/2020   Thrombocytopenia (HCC) 04/08/2019   HTN (hypertension) 07/05/2018   Tobacco use 07/05/2018   S/P drug eluting coronary stent placement 03/15/2018   Unstable angina (HCC) 02/24/2018   NSTEMI (non-ST elevated myocardial infarction) (HCC) 02/24/2018   Full thickness rotator cuff tear 07/31/2017   Periumbilical hernia 06/06/2014    PCP: [facility]  REFERRING PROVIDER: Evette Hoes, NP   REFERRING DIAG: H81.10 (ICD-10-CM) - Benign paroxysmal vertigo, unspecified ear   THERAPY DIAG:  Dizziness and giddiness  Unsteadiness on feet  ONSET DATE: started in 2019 with exacerbation starting in December 2024 with ED visit February 2025  Rationale for Evaluation and Treatment: Rehabilitation  SUBJECTIVE:   SUBJECTIVE STATEMENT:  Pt reports she made it to the elevator after last visit and then had emesis in a trash can due to being so nauseous. Pt reports she  has been "fine" since last therapy, but pt states she has been trying to avoid moving her head, especially in the bed to avoid having vertigo.  Denies pain. Denies stumbles/falls since last visit.    From Initial Eval: Pt states her vertigo started in February of 2019 following a fall. Pt states she was previously treated for vertigo in this clinic in ~3 years ago. Pt reports she had onset of vertigo in December and then had a stronger exacerbation causing her to go to ED  on 08/21/2023 due to vertigo that was exacerbated by turning to the R. When pt went to ED she had 3 days of headache with nausea and vomiting with onset of dizziness and blurred vision.   Pt accompanied by: friend  PERTINENT HISTORY:  PMH: NSTEMI, hypertension, vertigo, Type 2 diabetes, Hyperlipidemia, sleep apnea on CPAP  Recent ED visit on 08/21/2023 with MD note:  "63 year old female presenting to the emergency department for treatment and evaluation of 3 days of headache with nausea and vomiting with onset of dizziness and blurred vision this morning.  See HPI for further details.   While awaiting ER room assignment, labs were obtained.  She has a mild leukocytosis at 12.5.  BMP shows glucose of 147.  Initial troponin is normal at 3.  EKG is reassuring.   On exam, the patient has no focal weakness.  She states that if she lays with her head turned to the left that the headache and blurred vision improves.  If she turns her head to the right or sits up it gets worse.  She has not yet taken anything for the headache today.  Tylenol  ordered.  Respiratory panel ordered. MRI is negative for acute abnormality "  PAIN:  Are you having pain? No  PRECAUTIONS: None  RED FLAGS: None   WEIGHT BEARING RESTRICTIONS: No  FALLS: Has patient fallen in last 6 months? Yes. Number of falls 1x near fall due to the vertigo at [facility] when she caught herself against the wall  LIVING ENVIRONMENT: Lives with: lives alone Lives in: House/apartment - Tax [employer] Complex Stairs: No, all 1 level Has following equipment at home: patient has built-in call bells in her apartment where she can pull in the event of an emergency; shower chair; grab bars; cane   PLOF: Independent, Independent with household mobility without device, and Independent with homemaking with ambulation  PATIENT GOALS: "get rid of this vertigo"  OBJECTIVE:  Note: Objective measures were completed at Evaluation unless  otherwise noted.  DIAGNOSTIC FINDINGS:   EXAM: MRI HEAD WITHOUT CONTRAST   IMPRESSION: 1. No evidence of acute intracranial abnormality. 2. Mild chronic microvascular ischemic disease.   Electronically Signed   By: Stevenson Elbe M.D.   On: 08/21/2023 18:12  COGNITION: Overall cognitive status: Within functional limits for tasks assessed   SENSATION: WFL  EDEMA:  Not formally assessed  MUSCLE TONE:  Not formally assessed  DTRs:  Not formally assessed  POSTURE:  rounded shoulders and forward head  Cervical ROM:    Active  WFL flexion/extension and rotation in sitting A/PROM (deg) eval  Flexion   Extension   Right lateral flexion   Left lateral flexion   Right rotation   Left rotation   (Blank rows = not tested)  STRENGTH:   LOWER EXTREMITY MMT:   MMT  NOT FORMALLY ASSESSED, WFL for mobility tasks observed Right eval Left eval  Hip flexion    Hip abduction    Hip  adduction    Hip internal rotation    Hip external rotation    Knee flexion    Knee extension    Ankle dorsiflexion    Ankle plantarflexion    Ankle inversion    Ankle eversion    (Blank rows = not tested)  BED MOBILITY:  Sit to supine Modified independence Supine to sit Modified independence  TRANSFERS: Assistive device utilized: None  Sit to stand: Complete Independence Stand to sit: Complete Independence Chair to chair: Complete Independence Floor: not assessed  RAMP: not assessed  CURB: not assessed  GAIT: Gait pattern: WFL and step through pattern Distance walked: clinic distance Assistive device utilized: None Level of assistance: Complete Independence Comments: would benefit from testing of dynamic gait challenges  PATIENT SURVEYS:  DHI 30 indicating mild handicap 16-34 Points (mild handicap)  36-52 Points (moderate handicap)  54+ Points (severe handicap)  VESTIBULAR ASSESSMENT:   SYMPTOM BEHAVIOR:  Subjective history: Pt states she sleeps on her L  side because when she turns her head to R it causes vertigo. States bending forward causes her symptoms. Pt states she has had onset of vertigo during a massage in the past. Pt reports she has had a constant low tone ringing in R ear since 2019.   Non-Vestibular symptoms: tinnitus (constant low tone), nausea/vomiting  Type of dizziness: Imbalance (Disequilibrium), Spinning/Vertigo, Unsteady with head/body turns, Lightheadedness/Faint, and "World moves"  Frequency: pt tries to avoid head movements that will trigger it, but states happens roughly 1x/day when transitioning supine<>sit  Duration: "a few seconds"   Aggravating factors: Induced by position change: supine to sit and Induced by motion: bending down to the ground, turning body quickly, and turning head quickly  Relieving factors: head stationary, rest, slow movements, and pt has learned to modify her movements to avoid onset of symptoms  Progression of symptoms: not as severe as they were in February 2025  OCULOMOTOR EXAM:  Ocular Alignment: normal  Ocular ROM: No Limitations  Spontaneous Nystagmus: absent  Gaze-Induced Nystagmus: absent  Smooth Pursuits: intact and some extra eye movements when moving at a diagonal, but WNL  Saccades: intact  Convergence/Divergence: WFL Cover/Un-cover: WNL   VESTIBULAR - OCULAR REFLEX:   Slow VOR: Normal  VOR Cancellation: Corrective Saccades in both directions with pt reporting slight symptoms with this   Head-Impulse Test: HIT Right: positive HIT Left: positive  Dynamic Visual Acuity: Not able to be assessed   POSITIONAL TESTING: Loaded Right Dix-Hallpike: upbeating, right nystagmus, with pt very symptomatic and strong nystagmus, and Duration: ~30 seconds    OTHOSTATICS: not done  FUNCTIONAL GAIT: Functional gait assessment: would benefit from testing                                                                                                                             TREATMENT  DATE: 11/17/2023  Positional testing: Loaded R Dix-Hallpike x2: negative for nystagmus and symptoms Loaded L Dix Hallpike x1: negative for  nystagmus and symptoms R and L Roll Test: negative nystagmus and symptoms bilaterally  Re-test VOR Cancellation: a few corrective saccades with L head rotations    R HIT: still positive for corrective saccade L HIT: WNL, 1x possible corrective saccade, but not consistent, may have been attention   10 Meter Walk Test: Patient instructed to walk 10 meters (32.8 ft) as quickly and as safely as possible at their normal speed Results: 1.37 m/s independently (7.31 seconds)  Cut off scores:   Household Ambulator  < 0.4 m/s  Limited Community Ambulator  0.4 - 0.8 m/s  [employer]  > 0.8 m/s  Increased fall risk  < 1.58m/s  Crossing a Street  >1.71m/s  MCID 0.05 m/s (small), 0.13 m/s (moderate), 0.06 m/s (significant)  (ANPTA Core Set of Outcome Measures for Adults with Neurologic Conditions, 2018)    Pt participated in Functional Gait Assessment (FGA) with score of 19/30 demonstrating medium fall risk (low fall risk 25-28, medium fall risk 19-24, and high fall risk <19). However, pt limited with some of the items due to R>L knee pain.    [facility] PT Assessment - 11/17/23 0001       Functional Gait  Assessment   Gait assessed  Yes    Gait Level Surface Walks 20 ft in less than 5.5 sec, no assistive devices, good speed, no evidence for imbalance, normal gait pattern, deviates no more than 6 in outside of the 12 in walkway width.    Change in Gait Speed Able to smoothly change walking speed without loss of balance or gait deviation. Deviate no more than 6 in outside of the 12 in walkway width.    Gait with Horizontal Head Turns Performs head turns smoothly with no change in gait. Deviates no more than 6 in outside 12 in walkway width    Gait with Vertical Head Turns Performs task with slight change in gait velocity (eg, minor disruption to smooth  gait path), deviates 6 - 10 in outside 12 in walkway width or uses assistive device    Gait and Pivot Turn Pivot turns safely in greater than 3 sec and stops with no loss of balance, or pivot turns safely within 3 sec and stops with mild imbalance, requires small steps to catch balance.   pt just a little hesitant   Step Over Obstacle Is able to step over one shoe box (4.5 in total height) without changing gait speed. No evidence of imbalance.    Gait with Narrow Base of Support Ambulates less than 4 steps heel to toe or cannot perform without assistance.   requires HHA to feel safe performing this   Gait with Eyes Closed Cannot walk 20 ft without assistance, severe gait deviations or imbalance, deviates greater than 15 in outside 12 in walkway width or will not attempt task.    Ambulating Backwards Walks 20 ft, uses assistive device, slower speed, mild gait deviations, deviates 6-10 in outside 12 in walkway width.   slow speed   Steps Alternating feet, must use rail.    Total Score 19            Patient reports no significant concern with dynamic gait from her vertigo.    PATIENT EDUCATION: Education details: Educated on Geographical information systems officer and findings today, Educated on vestibular treatment, Provided hand-out on what to do following repositioning maneuver Person educated: Patient and Friend Education method: Explanation and Handouts Education comprehension: verbalized understanding and needs further education  HOME EXERCISE  PROGRAM:  GOALS: Goals reviewed with patient? Yes  SHORT TERM GOALS: Target date: 12/23/2023  Patient will be independent in home exercise program to improve strength/mobility and/or vertigo symptoms for better functional independence with ADLs.  Baseline: need to initiate Goal status: INITIAL   LONG TERM GOALS: Target date: 02/03/2024  Patient will reduce dizziness handicap inventory score to <30, for less dizziness with ADLs and increased safety with home  and work tasks.  Baseline: 30 Goal status: INITIAL  2.  Patient will increase Functional Gait Assessment (FGA) score to >20/30 as to reduce fall risk and improve dynamic gait safety with community ambulation.  Baseline: need to assess Goal status: INITIAL  3.  Patient will report having <2 episode of dizziness in a week following treatment. Baseline: currently having 1x/daily Goal status: INITIAL  4.  Patient will have negative testing for positional vertigo. Baseline: positive R Dix-Hallpike  Goal status: INITIAL   ASSESSMENT:  CLINICAL IMPRESSION: Patient is a 63 y.o. female who was seen today for follow-up physical therapy treatment for vertigo/dizziness with imbalance that started in December 2024 with exacerbation in February 2025. Upon repeat positional testing, patient negative for all. However, pt continues to have some saccadic eye corrections with VOR cancellation and HIT testing. Patient reports she has avoided any movements she thought might cause her vertigo since last visit. Therapist educated pt to return to full normal activity so we truly know whether her BPPV has been resolved. Patient participated in FGA scoring 19/30 with pt having some limitations due to knee pain and pt reporting no concern for her balance from the BPPV. Pt requests to participate in at least 1 more therapy visit to ensure her BPPV has resolved, prior to D/C. Ms.Riviere will benefit from further skilled PT to improve these deficits in order to decrease her dizziness/vertigo, increase QOL, and ease/safety with ADLs and functional mobility to maintain independent level.  OBJECTIVE IMPAIRMENTS: decreased activity tolerance, decreased balance, decreased knowledge of condition, decreased mobility, difficulty walking, and dizziness.   ACTIVITY LIMITATIONS: lifting, bending, squatting, sleeping, bed mobility, reach over head, and locomotion level  PARTICIPATION LIMITATIONS: cleaning, laundry, and community  activity  PERSONAL FACTORS: Past/current experiences, Sex, Time since onset of injury/illness/exacerbation, and 3+ comorbidities: NSTEMI, hypertension, vertigo, Type 2 diabetes, Hyperlipidemia, sleep apnea on CPAP are also affecting patient's functional outcome.   REHAB POTENTIAL: Excellent  CLINICAL DECISION MAKING: Evolving/moderate complexity  EVALUATION COMPLEXITY: Moderate   PLAN:  PT FREQUENCY: 1-2x/week  PT DURATION: 12 weeks  PLANNED INTERVENTIONS: 97164- PT Re-evaluation, 97750- Physical Performance Testing, 97110-Therapeutic exercises, 97530- Therapeutic activity, 97112- Neuromuscular re-education, 97535- Self Care, 95638- Manual therapy, [phone]- Gait training, [phone]- Canalith repositioning, Patient/Family education, Balance training, Stair training, Taping, Dry Needling, Joint mobilization, Spinal mobilization, Vestibular training, Visual/preceptual remediation/compensation, Cryotherapy, Moist heat, and Biofeedback  PLAN FOR NEXT SESSION:  - repeat positional testing as indicated - discharge at that time if pt's symptoms have resolved    Carlen Chasten, PT, DPT, NCS, CSRS Physical Therapist - [facility]  [facility]  5:08 PM 11/17/23

## 2023-11-17 ENCOUNTER — Ambulatory Visit: Admitting: Physical Therapy

## 2023-11-17 DIAGNOSIS — R42 Dizziness and giddiness: Secondary | ICD-10-CM | POA: Diagnosis not present

## 2023-11-17 DIAGNOSIS — R2681 Unsteadiness on feet: Secondary | ICD-10-CM

## 2023-11-25 ENCOUNTER — Ambulatory Visit: Admitting: Physical Therapy

## 2023-11-25 DIAGNOSIS — R2681 Unsteadiness on feet: Secondary | ICD-10-CM

## 2023-11-25 DIAGNOSIS — R42 Dizziness and giddiness: Secondary | ICD-10-CM

## 2023-11-25 NOTE — Therapy (Signed)
 OUTPATIENT PHYSICAL THERAPY VESTIBULAR TREATMENT/DISCHARGE      Patient Name: Karen Potts MRN: [id number] DOB:[birth year], 63 y.o., female Today's Date: 11/25/2023  END OF SESSION:   PT End of Session - 11/25/23 1321     Visit Number 3    Number of Visits 24    Date for PT Re-Evaluation 02/03/24    PT Start Time 1320    PT Stop Time 1354    PT Time Calculation (min) 34 min    Activity Tolerance Patient tolerated treatment well    Behavior During Therapy [facility] for tasks assessed/performed               Past Medical History:  Diagnosis Date   Bulging lumbar disc 2022   Diabetes mellitus without complication (HCC)    Hypertension    MI, old 2019   Sleep apnea    Vertigo    Past Surgical History:  Procedure Laterality Date   ABDOMINAL HYSTERECTOMY     BREAST BIOPSY Left 10/2017    APOCRINE TYPE CYST WITH FIBROSIS Of the Wall   BREAST BIOPSY Left 07/15/2022   Stereo Bx, X Clip, neg   BREAST BIOPSY Left 07/15/2022   MM LT BREAST BX W LOC DEV 1ST LESION IMAGE BX SPEC STEREO GUIDE 07/15/2022 [facility]   COLONOSCOPY WITH PROPOFOL  N/A 10/20/2022   Procedure: COLONOSCOPY WITH PROPOFOL ;  Surgeon: Luke Salaam, MD;  Location: [facility];  Service: Gastroenterology;  Laterality: N/A;   CORONARY STENT INTERVENTION N/A 02/24/2018   Procedure: CORONARY STENT INTERVENTION;  Surgeon: Percival Brace, MD;  Location: [facility] INVASIVE CV LAB;  Service: Cardiovascular;  Laterality: N/A;   HERNIA REPAIR     LEFT HEART CATH AND CORONARY ANGIOGRAPHY Right 02/24/2018   Procedure: LEFT HEART CATH AND CORONARY ANGIOGRAPHY;  Surgeon: Percival Brace, MD;  Location: [facility] INVASIVE CV LAB;  Service: Cardiovascular;  Laterality: Right;   SHOULDER ARTHROSCOPY WITH OPEN ROTATOR CUFF REPAIR Left 08/14/2017   Procedure: SHOULDER ARTHROSCOPY WITH ROTATOR CUFF REPAIR, BICEPS TENOTOMY, SUBACROMIAL DECOMPRESSION;  Surgeon: Jerlyn Moons, MD;  Location: [facility] ORS;  Service:  Orthopedics;  Laterality: Left;   UMBILICAL HERNIA REPAIR     Patient Active Problem List   Diagnosis Date Noted   OSA (obstructive sleep apnea) 07/13/2023   CPAP use counseling 07/13/2023   Obesity, morbid (HCC) 07/13/2023   Encounter for screening colonoscopy 10/20/2022   Adenomatous polyp of colon 10/20/2022   Morbid obesity (HCC) 09/24/2022   Heart palpitations 07/24/2021   Tinnitus of right ear 08/07/2020   Vertigo 08/07/2020   Difficulty walking 04/18/2020   Numbness and tingling 03/22/2020   Obstructive sleep apnea syndrome 01/18/2020   Thrombocytopenia (HCC) 04/08/2019   HTN (hypertension) 07/05/2018   Tobacco use 07/05/2018   S/P drug eluting coronary stent placement 03/15/2018   Unstable angina (HCC) 02/24/2018   NSTEMI (non-ST elevated myocardial infarction) (HCC) 02/24/2018   Full thickness rotator cuff tear 07/31/2017   Periumbilical hernia 06/06/2014    PCP: [facility]  REFERRING PROVIDER: Evette Hoes, NP   REFERRING DIAG: H81.10 (ICD-10-CM) - Benign paroxysmal vertigo, unspecified ear   THERAPY DIAG:  Dizziness and giddiness  Unsteadiness on feet  ONSET DATE: started in 2019 with exacerbation starting in December 2024 with ED visit February 2025  Rationale for Evaluation and Treatment: Rehabilitation  SUBJECTIVE:   SUBJECTIVE STATEMENT:  Pt reports she is doing good. Pt states she has been "turning her head left and right in the bed" and hasn't had any additional  episodes of dizziness/vertigo. Pt very appreciative and excited about this.   Pt requests to repeat the positional testing today to make her feel more confident that her vertigo is gone.  Denies pain. Denies stumbles/falls since last visit.    From Initial Eval: Pt states her vertigo started in February of 2019 following a fall. Pt states she was previously treated for vertigo in this clinic in ~3 years ago. Pt reports she had onset of vertigo in December and  then had a stronger exacerbation causing her to go to ED on 08/21/2023 due to vertigo that was exacerbated by turning to the R. When pt went to ED she had 3 days of headache with nausea and vomiting with onset of dizziness and blurred vision.   Pt accompanied by: friend  PERTINENT HISTORY:  PMH: NSTEMI, hypertension, vertigo, Type 2 diabetes, Hyperlipidemia, sleep apnea on CPAP  Recent ED visit on 08/21/2023 with MD note:  "63 year old female presenting to the emergency department for treatment and evaluation of 3 days of headache with nausea and vomiting with onset of dizziness and blurred vision this morning.  See HPI for further details.   While awaiting ER room assignment, labs were obtained.  She has a mild leukocytosis at 12.5.  BMP shows glucose of 147.  Initial troponin is normal at 3.  EKG is reassuring.   On exam, the patient has no focal weakness.  She states that if she lays with her head turned to the left that the headache and blurred vision improves.  If she turns her head to the right or sits up it gets worse.  She has not yet taken anything for the headache today.  Tylenol  ordered.  Respiratory panel ordered. MRI is negative for acute abnormality "  PAIN:  Are you having pain? No  PRECAUTIONS: None  RED FLAGS: None   WEIGHT BEARING RESTRICTIONS: No  FALLS: Has patient fallen in last 6 months? Yes. 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  DIAGNOSTIC FINDINGS:   EXAM: MRI HEAD WITHOUT CONTRAST   IMPRESSION: 1. No evidence of acute intracranial abnormality. 2. Mild chronic microvascular ischemic disease.   Electronically Signed   By: Stevenson Elbe M.D.   On: 08/21/2023 18:12  COGNITION: Overall cognitive status: Within functional limits for tasks assessed   SENSATION: WFL  EDEMA:  Not formally assessed  MUSCLE TONE:  Not formally assessed  DTRs:  Not formally assessed  POSTURE:  rounded shoulders and forward head  Cervical ROM:    Active  WFL flexion/extension and rotation in sitting A/PROM (deg) eval  Flexion   Extension   Right lateral flexion   Left lateral flexion   Right rotation   Left rotation   (Blank rows = not tested)  STRENGTH:   LOWER EXTREMITY MMT:   MMT  NOT FORMALLY ASSESSED, WFL for mobility tasks observed Right eval Left eval  Hip flexion    Hip  abduction    Hip adduction    Hip internal rotation    Hip external rotation    Knee flexion    Knee extension    Ankle dorsiflexion    Ankle plantarflexion    Ankle inversion    Ankle eversion    (Blank rows = not tested)  BED MOBILITY:  Sit to supine Modified independence Supine to sit Modified independence  TRANSFERS: Assistive device utilized: None  Sit to stand: Complete Independence Stand to sit: Complete Independence Chair to chair: Complete Independence Floor: not assessed  RAMP: not assessed  CURB: not assessed  GAIT: Gait pattern: WFL and step through pattern Distance walked: clinic distance Assistive device utilized: None Level of assistance: Complete Independence Comments: would benefit from testing of dynamic gait challenges  PATIENT SURVEYS:  DHI 30 indicating mild handicap 16-34 Points (mild handicap)  36-52 Points (moderate handicap)  54+ Points (severe handicap)  VESTIBULAR ASSESSMENT:   SYMPTOM  BEHAVIOR:  Subjective history: Pt states she sleeps on her L side because when she turns her head to R it causes vertigo. States bending forward causes her symptoms. Pt states she has had onset of vertigo during a massage in the past. Pt reports she has had a constant low tone ringing in R ear since 2019.   Non-Vestibular symptoms: tinnitus (constant low tone), nausea/vomiting  Type of dizziness: Imbalance (Disequilibrium), Spinning/Vertigo, Unsteady with head/body turns, Lightheadedness/Faint, and "World moves"  Frequency: pt tries to avoid head movements that will trigger it, but states happens roughly 1x/day when transitioning supine<>sit  Duration: "a few seconds"   Aggravating factors: Induced by position change: supine to sit and Induced by motion: bending down to the ground, turning body quickly, and turning head quickly  Relieving factors: head stationary, rest, slow movements, and pt has learned to modify her movements to avoid onset of symptoms  Progression of symptoms: not as severe as they were in February 2025  OCULOMOTOR EXAM:  Ocular Alignment: normal  Ocular ROM: No Limitations  Spontaneous Nystagmus: absent  Gaze-Induced Nystagmus: absent  Smooth Pursuits: intact and some extra eye movements when moving at a diagonal, but WNL  Saccades: intact  Convergence/Divergence: WFL Cover/Un-cover: WNL   VESTIBULAR - OCULAR REFLEX:   Slow VOR: Normal  VOR Cancellation: Corrective Saccades in both directions with pt reporting slight symptoms with this   Head-Impulse Test: HIT Right: positive HIT Left: positive  Dynamic Visual Acuity: Not able to be assessed   POSITIONAL TESTING: Loaded Right Dix-Hallpike: upbeating, right nystagmus, with pt very symptomatic and strong nystagmus, and Duration: ~30 seconds    OTHOSTATICS: not done  FUNCTIONAL GAIT: Functional gait assessment: would benefit from testing                                                                                                                              TREATMENT DATE: 11/25/2023  Positional testing: Loaded R Dix-Hallpike x2: negative for nystagmus and symptoms Loaded L  Etta Heritage x2: negative for nystagmus and symptoms R and L Roll Test: negative nystagmus and symptoms bilaterally  Patient reports no significant concern with dynamic gait from her vertigo.  Performed 1 set of the below exercises as prescribed on pt's HEP to ensure pt understanding  - Seated Gaze Stabilization with Head Rotation - 10 reps - Seated Gaze Stabilization with Head Nod  -10 reps - Seated VOR Cancellation - 10 reps - Standing Balance with Eyes Closed - 30 second hold  Pt reports no dizziness symptoms with the exercises and reports being able to keep the target in focus. Pt educated that she can discontinue these exercise after 1-2 weeks if they are easy with no difficulty keeping target in focus and not experiencing any dizziness. Pt does report some neck "clicking" when rotating her head towards the RIGHT; however, denies pain. Therapist instructed pt to discontinue the exercises if she experiences any pain.   Pt in agreement with plan to discharge from therapy today since her symptoms have been resolved and she reports no additional concerns.   PATIENT EDUCATION: Education details: Educated on Geographical information systems officer and findings today, Educated on vestibular treatment, Provided hand-out on what to do following repositioning maneuver Person educated: Patient and Friend Education method: Explanation and Handouts Education comprehension: verbalized understanding and needs further education  HOME EXERCISE PROGRAM:  Access Code: [email or web address] URL: [email or web address] Date: 11/25/2023 Prepared by: Carlen Chasten  Exercises - Seated Gaze Stabilization with Head Rotation  - 1 x daily - 7 x weekly - 2-3 sets - 10-20 reps - Seated Gaze Stabilization with Head Nod  - 1 x daily - 7 x weekly - 2-3 sets - 10-20  reps - Seated VOR Cancellation  - 1 x daily - 7 x weekly - 2-3 sets - 10-20 reps - Standing Balance with Eyes Closed  - 1 x daily - 7 x weekly - 2 sets - 30 seconds hold  GOALS: Goals reviewed with patient? Yes  SHORT TERM GOALS: Target date: 12/23/2023  Patient will be independent in home exercise program to improve strength/mobility and/or vertigo symptoms for better functional independence with ADLs.  Baseline: provided on 11/25/2023 Goal status: MET   LONG TERM GOALS: Target date: 02/03/2024  Patient will reduce dizziness handicap inventory score to <30, for less dizziness with ADLs and increased safety with home and work tasks.  Baseline: 30 Goal status: PARTIALLY MET  2.  Patient will increase Functional Gait Assessment (FGA) score to >20/30 as to reduce fall risk and improve dynamic gait safety with community ambulation.  Baseline: 11/17/2023: 19/30 Goal status: PARTIALLY MET  3.  Patient will report having <2 episode of dizziness in a week following treatment. Baseline: currently having 1x/daily 11/26/2023: denies any dizziness since after 1st visit Goal status: MET  4.  Patient will have negative testing for positional vertigo. Baseline: positive R Dix-Hallpike  11/26/2023: negative  Goal status: MET   ASSESSMENT:  CLINICAL IMPRESSION:  Patient is a 63 y.o. female who was seen today for follow-up physical therapy treatment for vertigo/dizziness with imbalance that started in December 2024 with exacerbation in February 2025. Patient requesting to repeat positional testing again today and was patient negative for all positions. Pt has continued to have some saccadic eye corrections with VOR cancellation and HIT testing; therefore, prescribed gaze stabilization exercises on HEP as well as standing balance eyes closed for increased integration of her somatosensory and vestibular systems for balance stability. Patient reports she has been intentionally moving her head  more when in  the bed and has not had any additional vertigo/dizziness. Pt reports no additional questions/concerns and is in agreement with plan to discharge from therapy at this time.   OBJECTIVE IMPAIRMENTS: decreased activity tolerance, decreased balance, decreased knowledge of condition, decreased mobility, difficulty walking, and dizziness.   ACTIVITY LIMITATIONS: lifting, bending, squatting, sleeping, bed mobility, reach over head, and locomotion level  PARTICIPATION LIMITATIONS: cleaning, laundry, and community activity  PERSONAL FACTORS: Past/current experiences, Sex, Time since onset of injury/illness/exacerbation, and 3+ comorbidities: NSTEMI, hypertension, vertigo, Type 2 diabetes, Hyperlipidemia, sleep apnea on CPAP are also affecting patient's functional outcome.   REHAB POTENTIAL: Excellent  CLINICAL DECISION MAKING: Evolving/moderate complexity  EVALUATION COMPLEXITY: Moderate   PLAN:  PT FREQUENCY: 1-2x/week  PT DURATION: 12 weeks  PLANNED INTERVENTIONS: 97164- PT Re-evaluation, 97750- Physical Performance Testing, 97110-Therapeutic exercises, 97530- Therapeutic activity, 97112- Neuromuscular re-education, 97535- Self Care, 86578- Manual therapy, [phone]- Gait training, [phone]- Canalith repositioning, Patient/Family education, Balance training, Stair training, Taping, Dry Needling, Joint mobilization, Spinal mobilization, Vestibular training, Visual/preceptual remediation/compensation, Cryotherapy, Moist heat, and Biofeedback  PLAN FOR NEXT SESSION:  Discharged     Carlen Chasten, PT, DPT, NCS, CSRS Physical Therapist - [place]  [facility]  1:57 PM 11/25/23

## 2023-12-02 ENCOUNTER — Ambulatory Visit

## 2023-12-02 ENCOUNTER — Ambulatory Visit: Admitting: Physical Therapy

## 2023-12-07 ENCOUNTER — Ambulatory Visit: Admitting: Physical Therapy

## 2023-12-09 ENCOUNTER — Ambulatory Visit: Admitting: Physical Therapy

## 2023-12-14 ENCOUNTER — Ambulatory Visit: Admitting: Physical Therapy

## 2023-12-16 ENCOUNTER — Ambulatory Visit: Admitting: Physical Therapy

## 2023-12-21 ENCOUNTER — Ambulatory Visit: Admitting: Physical Therapy

## 2023-12-24 ENCOUNTER — Ambulatory Visit: Admitting: Physical Therapy

## 2024-01-12 ENCOUNTER — Ambulatory Visit: Admitting: Physical Therapy

## 2024-01-14 ENCOUNTER — Ambulatory Visit

## 2024-01-19 ENCOUNTER — Ambulatory Visit: Admitting: Physical Therapy

## 2024-01-19 NOTE — Congregational Nurse Program (Signed)
  Dept: [phone]   Congregational Nurse Program Note  Date of Encounter: 01/19/2024  Past Medical History: Past Medical History:  Diagnosis Date   Bulging lumbar disc 2022   Diabetes mellitus without complication (HCC)    Hypertension    MI, old 2019   Sleep apnea    Vertigo     Encounter Details:  Community Questionnaire - 01/19/24 1516       Questionnaire   Ask client: Do you give verbal consent for me to treat you today? Yes    Student Assistance N/A    Location Patient Served  [facility]    Encounter Setting CN site    Population Status Unknown    Insurance Medicaid    Insurance/Financial Assistance Referral N/A    Medication N/A    Medical Provider Yes    Screening Referrals Made N/A    Medical Referrals Made N/A    Medical Appointment Completed N/A    CNP Interventions Advocate/Support    Screenings CN Performed Blood Pressure    ED Visit Averted N/A    Life-Saving Intervention Made N/A          Today's Vitals   01/19/24 1515  BP: 139/79   There is no height or weight on file to calculate BMI.  Patient has been using BP monitor daily and has been documenting on log.

## 2024-01-21 ENCOUNTER — Ambulatory Visit

## 2024-01-26 ENCOUNTER — Ambulatory Visit: Admitting: Physical Therapy

## 2024-01-28 ENCOUNTER — Ambulatory Visit: Admitting: Physical Therapy

## 2024-02-18 LAB — GLUCOSE, POCT (MANUAL RESULT ENTRY): POC Glucose: 222 mg/dL — AB (ref 70–99)

## 2024-02-18 NOTE — Congregational Nurse Program (Signed)
  Dept: [phone]   Congregational Nurse Program Note  Date of Encounter: 02/18/2024  Past Medical History: Past Medical History:  Diagnosis Date   Bulging lumbar disc 2022   Diabetes mellitus without complication (HCC)    Hypertension    MI, old 2019   Sleep apnea    Vertigo     Encounter Details:  Community Questionnaire - 02/18/24 1754       Questionnaire   Ask client: Do you give verbal consent for me to treat you today? Yes    Student Assistance N/A    Location Patient Served  [facility]    Encounter Setting CN site    Population Status Unknown    Insurance Medicaid    Insurance/Financial Assistance Referral N/A    Medication N/A    Medical Provider Yes    Screening Referrals Made N/A    Medical Referrals Made N/A    Medical Appointment Completed N/A    CNP Interventions Advocate/Support    Screenings CN Performed Blood Pressure;Blood Glucose    ED Visit Averted N/A    Life-Saving Intervention Made N/A          Today's Vitals   02/18/24 1754  BP: 132/62   There is no height or weight on file to calculate BMI.'

## 2024-02-26 ENCOUNTER — Encounter: Payer: Self-pay | Admitting: Internal Medicine

## 2024-02-26 ENCOUNTER — Inpatient Hospital Stay (HOSPITAL_BASED_OUTPATIENT_CLINIC_OR_DEPARTMENT_OTHER): Payer: 59 | Admitting: Internal Medicine

## 2024-02-26 ENCOUNTER — Inpatient Hospital Stay: Payer: 59 | Attending: Internal Medicine

## 2024-02-26 DIAGNOSIS — Z79899 Other long term (current) drug therapy: Secondary | ICD-10-CM | POA: Insufficient documentation

## 2024-02-26 DIAGNOSIS — D696 Thrombocytopenia, unspecified: Secondary | ICD-10-CM

## 2024-02-26 DIAGNOSIS — E119 Type 2 diabetes mellitus without complications: Secondary | ICD-10-CM | POA: Insufficient documentation

## 2024-02-26 DIAGNOSIS — Z7902 Long term (current) use of antithrombotics/antiplatelets: Secondary | ICD-10-CM | POA: Insufficient documentation

## 2024-02-26 DIAGNOSIS — Z90710 Acquired absence of both cervix and uterus: Secondary | ICD-10-CM | POA: Diagnosis not present

## 2024-02-26 DIAGNOSIS — D72820 Lymphocytosis (symptomatic): Secondary | ICD-10-CM | POA: Insufficient documentation

## 2024-02-26 DIAGNOSIS — I252 Old myocardial infarction: Secondary | ICD-10-CM | POA: Diagnosis not present

## 2024-02-26 DIAGNOSIS — F17210 Nicotine dependence, cigarettes, uncomplicated: Secondary | ICD-10-CM | POA: Diagnosis not present

## 2024-02-26 DIAGNOSIS — D693 Immune thrombocytopenic purpura: Secondary | ICD-10-CM | POA: Diagnosis present

## 2024-02-26 DIAGNOSIS — Z7984 Long term (current) use of oral hypoglycemic drugs: Secondary | ICD-10-CM | POA: Insufficient documentation

## 2024-02-26 DIAGNOSIS — G4733 Obstructive sleep apnea (adult) (pediatric): Secondary | ICD-10-CM | POA: Diagnosis not present

## 2024-02-26 DIAGNOSIS — I10 Essential (primary) hypertension: Secondary | ICD-10-CM | POA: Insufficient documentation

## 2024-02-26 DIAGNOSIS — Z7982 Long term (current) use of aspirin: Secondary | ICD-10-CM | POA: Diagnosis not present

## 2024-02-26 DIAGNOSIS — I2510 Atherosclerotic heart disease of native coronary artery without angina pectoris: Secondary | ICD-10-CM | POA: Insufficient documentation

## 2024-02-26 LAB — CMP (CANCER CENTER ONLY)
ALT: 21 U/L (ref 0–44)
AST: 25 U/L (ref 15–41)
Albumin: 3.7 g/dL (ref 3.5–5.0)
Alkaline Phosphatase: 65 U/L (ref 38–126)
Anion gap: 8 (ref 5–15)
BUN: 10 mg/dL (ref 8–23)
CO2: 25 mmol/L (ref 22–32)
Calcium: 9.4 mg/dL (ref 8.9–10.3)
Chloride: 102 mmol/L (ref 98–111)
Creatinine: 0.87 mg/dL (ref 0.44–1.00)
GFR, Estimated: >60 mL/min (ref >60)
Glucose, Bld: 184 mg/dL — ABNORMAL HIGH (ref 70–99)
Potassium: 3.3 mmol/L — ABNORMAL LOW (ref 3.5–5.1)
Sodium: 135 mmol/L (ref 135–145)
Total Bilirubin: 0.8 mg/dL (ref 0.0–1.2)
Total Protein: 7.3 g/dL (ref 6.5–8.1)

## 2024-02-26 LAB — VITAMIN B12: Vitamin B-12: 239 pg/mL (ref 180–914)

## 2024-02-26 LAB — CBC WITH DIFFERENTIAL (CANCER CENTER ONLY)
Abs Immature Granulocytes: 0.04 K/uL (ref 0.00–0.07)
Basophils Absolute: 0 K/uL (ref 0.0–0.1)
Basophils Relative: 0 %
Eosinophils Absolute: 0.4 K/uL (ref 0.0–0.5)
Eosinophils Relative: 4 %
HCT: 41.8 % (ref 36.0–46.0)
Hemoglobin: 13.6 g/dL (ref 12.0–15.0)
Immature Granulocytes: 0 %
Lymphocytes Relative: 40 %
Lymphs Abs: 4.2 K/uL — ABNORMAL HIGH (ref 0.7–4.0)
MCH: 27.4 pg (ref 26.0–34.0)
MCHC: 32.5 g/dL (ref 30.0–36.0)
MCV: 84.1 fL (ref 80.0–100.0)
Monocytes Absolute: 0.7 K/uL (ref 0.1–1.0)
Monocytes Relative: 6 %
Neutro Abs: 5.1 K/uL (ref 1.7–7.7)
Neutrophils Relative %: 50 %
Platelet Count: 111 K/uL — ABNORMAL LOW (ref 150–400)
RBC: 4.97 MIL/uL (ref 3.87–5.11)
RDW: 15 % (ref 11.5–15.5)
WBC Count: 10.4 K/uL (ref 4.0–10.5)
nRBC: 0 % (ref 0.0–0.2)

## 2024-02-26 LAB — LACTATE DEHYDROGENASE: LDH: 115 U/L (ref 98–192)

## 2024-02-26 NOTE — Progress Notes (Signed)
 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  Karen Potts 63 y.o.  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   Patient denies any easy bruising or bleeding.No blood in stool.    Review of Systems  Constitutional:  Positive for malaise/fatigue. Negative for chills, fever and weight loss.  HENT:  Negative for hearing loss, nosebleeds, sore throat and tinnitus.   Eyes:  Negative for blurred vision and double vision.  Respiratory:  Negative for cough, hemoptysis, shortness of breath and wheezing.   Cardiovascular:  Negative for chest pain, palpitations and leg swelling.  Gastrointestinal:  Negative for abdominal pain, blood in stool, constipation, diarrhea, melena, nausea and vomiting.  Genitourinary:  Negative for dysuria and urgency.  Musculoskeletal:  Positive for back pain and joint pain. Negative for falls and myalgias.  Skin:  Negative for itching and rash.  Neurological:  Negative for dizziness, tingling, sensory change, loss of consciousness, weakness and headaches.  Endo/Heme/Allergies:  Negative for environmental allergies. Does not bruise/bleed easily.  Psychiatric/Behavioral:  Negative for depression. The patient is not nervous/anxious and does not have insomnia.       MEDICAL HISTORY:  Past Medical History:  Diagnosis Date   Bulging lumbar disc 2022   Diabetes mellitus without complication (HCC)    Hypertension    MI, old 2019   Sleep apnea    Vertigo     SURGICAL HISTORY: Past Surgical History:  Procedure Laterality Date   ABDOMINAL HYSTERECTOMY     BREAST BIOPSY Left 10/2017    APOCRINE TYPE CYST WITH FIBROSIS Of the Wall   BREAST BIOPSY Left 07/15/2022   Stereo Bx, X Clip, neg   BREAST BIOPSY Left 07/15/2022   MM LT BREAST BX W LOC DEV 1ST LESION IMAGE BX SPEC STEREO GUIDE 07/15/2022 [facility]   COLONOSCOPY WITH PROPOFOL  N/A 10/20/2022   Procedure: COLONOSCOPY WITH PROPOFOL ;  Surgeon: Therisa Bi, MD;  Location: [facility] ENDOSCOPY;  Service: Gastroenterology;  Laterality: N/A;   CORONARY STENT INTERVENTION N/A 02/24/2018   Procedure: CORONARY STENT INTERVENTION;  Surgeon: Ammon Blunt, MD;  Location: [facility] INVASIVE CV LAB;  Service: Cardiovascular;  Laterality: N/A;   HERNIA REPAIR     LEFT HEART CATH AND CORONARY ANGIOGRAPHY Right 02/24/2018   Procedure: LEFT HEART CATH AND CORONARY ANGIOGRAPHY;  Surgeon: Ammon Blunt, MD;  Location: [facility] INVASIVE CV LAB;  Service: Cardiovascular;  Laterality: Right;   SHOULDER ARTHROSCOPY WITH OPEN ROTATOR CUFF REPAIR Left 08/14/2017   Procedure: SHOULDER ARTHROSCOPY WITH ROTATOR CUFF REPAIR, BICEPS TENOTOMY, SUBACROMIAL DECOMPRESSION;  Surgeon: Leora Lynwood SAUNDERS, MD;  Location: [facility] ORS;  Service: Orthopedics;  Laterality: Left;   UMBILICAL HERNIA REPAIR      SOCIAL HISTORY: Social History   Socioeconomic History   Marital status: Married    Spouse name: Not on file   Number of children: Not on file   Years of education: Not on file   Highest education level: Not on  file  Occupational History   Not on file  Tobacco Use   Smoking status: Some Days    Current packs/day: 0.00    Types: Cigarettes    Last attempt to quit: 02/24/2018    Years since quitting: 6.0   Smokeless  tobacco: Never   Tobacco comments:    Angelys said she quit smoking the day of her heart attack.  Vaping Use   Vaping status: Never Used  Substance and Sexual Activity   Alcohol use: No   Drug use: No   Sexual activity: Yes    Birth control/protection: Condom  Other Topics Concern   Not on file  Social History Narrative   Not working/disability; smoking; no alcohol. In [place]/ lives with a friend.    Social Drivers of Corporate investment banker Strain: Not on file  Food Insecurity: Not on file  Transportation Needs: Not on file  Physical Activity: Not on file  Stress: Not on file  Social Connections: Not on file  Intimate Partner Violence: Not on file    FAMILY HISTORY: Family History  Problem Relation Age of Onset   Breast cancer Neg Hx     ALLERGIES:  has no known allergies.  MEDICATIONS:  Current Outpatient Medications  Medication Sig Dispense Refill   aspirin  EC 81 MG tablet Take 81 mg by mouth daily.     atorvastatin  (LIPITOR) 80 MG tablet Take 1 tablet (80 mg total) by mouth daily at 6 PM. 30 tablet 0   JARDIANCE 10 MG TABS tablet Take 10 mg by mouth daily.     lisinopril -hydrochlorothiazide  (PRINZIDE ,ZESTORETIC ) 10-12.5 MG tablet TAKE 1 TABLET BY MOUTH ONCE DAILY 90 tablet 1   meclizine  (ANTIVERT ) 12.5 MG tablet Take 1 tablet (12.5 mg total) by mouth 3 (three) times daily as needed for dizziness or nausea. 30 tablet 1   metFORMIN (GLUCOPHAGE) 500 MG tablet Take 500 mg by mouth daily with breakfast.     nitroGLYCERIN  (NITROSTAT ) 0.4 MG SL tablet Place 1 tablet (0.4 mg total) under the tongue every 5 (five) minutes as needed for chest pain. 30 tablet 12   triamcinolone cream (KENALOG) 0.1 % Apply topically 2 (two) times daily.     No current facility-administered medications for this visit.     SABRA  PHYSICAL EXAMINATION:   Vitals:   02/26/24 1338  BP: 122/73  Pulse: 68  Resp: 16  Temp: (!) 97 F (36.1 C)  SpO2: 100%    Filed Weights   02/26/24  1338  Weight: 125.7 kg     Physical Exam HENT:     Head: Normocephalic and atraumatic.     Mouth/Throat:     Pharynx: No oropharyngeal exudate.  Eyes:     Pupils: Pupils are equal, round, and reactive to light.  Cardiovascular:     Rate and Rhythm: Normal rate and regular rhythm.  Pulmonary:     Effort: No respiratory distress.     Breath sounds: No wheezing.  Abdominal:     General: Bowel sounds are normal. There is no distension.     Palpations: Abdomen is soft. There is no mass.     Tenderness: There is no abdominal tenderness. There is no guarding or rebound.  Musculoskeletal:        General: No tenderness. Normal range of motion.     Cervical back: Normal range of motion and neck supple.  Skin:    General: Skin is warm.  Neurological:     Mental Status: She  is alert and oriented to person, place, and time.  Psychiatric:        Mood and Affect: Affect normal.      LABORATORY DATA:  I have reviewed the data as listed Lab Results  Component Value Date   WBC 10.4 02/26/2024   HGB 13.6 02/26/2024   HCT 41.8 02/26/2024   MCV 84.1 02/26/2024   PLT 111 (L) 02/26/2024   Recent Labs    08/21/23 1201 08/28/23 1301 02/26/24 1331  NA 138 134* 135  K 4.1 3.7 3.3*  CL 103 102 102  CO2 26 24 25   GLUCOSE 147* 146* 184*  BUN 17 18 10   CREATININE 0.79 0.77 0.87  CALCIUM  9.1 8.9 9.4  GFRNONAA >60 >60 >60  PROT  --  7.3 7.3  ALBUMIN  --  3.8 3.7  AST  --  22 25  ALT  --  25 21  ALKPHOS  --  74 65  BILITOT  --  0.8 0.8     No results found.   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  Platelets today are 111- stable.  Asymptomatic.  Would not recommend treatment unless less than 50 or symptomatic./See below- stable.   # Chronic mild leucocytosis/mild lymphocytosis- check flow cytometry at next visit.   # CAD/MI- [Aug 2019]- asprin; Effient  Cule.Councilman ] [Dr.Parschoes/]-stable  #  DISPOSITION: # follow up in 6 month-MD: labs-1 weeks PRIOR-  cbc/cmp;LDH/b12 levels; peripheral blood flowcytometry- Dr.B  PCP: community health     Cindy JONELLE Joe, MD 02/26/2024

## 2024-02-26 NOTE — Assessment & Plan Note (Addendum)
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  Platelets today are 111- stable.  Asymptomatic.  Would not recommend treatment unless less than 50 or symptomatic./See below- stable.   # Chronic mild leucocytosis/mild lymphocytosis- check flow cytometry at next visit.   # CAD/MI- [Aug 2019]- asprin; Effient  Cule.Councilman ] [Dr.Parschoes/]-stable  # DISPOSITION: # follow up in 6 month-MD: labs-1 weeks PRIOR-  cbc/cmp;LDH/b12 levels; peripheral blood flowcytometry- Dr.B  PCP: community health

## 2024-02-26 NOTE — Progress Notes (Signed)
 Easy bruising: NO  Petechiae (bleeding under skin): NO Gingival bleeding (gums): NO Epistaxis (nose bleeds): NO  Hematochezia (blood in stools): NO Hematuria (blood in urine): NO

## 2024-03-03 NOTE — Congregational Nurse Program (Cosign Needed)
  Dept: [phone]   Congregational Nurse Program Note  Date of Encounter: 03/03/2024  Patient complains of hurting in left hip, says she is seeing orthopedic doctor next week.  Patient given toilet seat riser to aid with mobility in standing up and sitting.   Past Medical History: Past Medical History:  Diagnosis Date   Bulging lumbar disc 2022   Diabetes mellitus without complication (HCC)    Hypertension    MI, old 2019   Sleep apnea    Vertigo     Encounter Details:  Community Questionnaire - 03/03/24 1955       Questionnaire   Ask client: Do you give verbal consent for me to treat you today? Yes    Student Assistance N/A    Location [facility] S[facility]  [facility]    Encounter Setting CN site    Population Status Unknown    Insurance Medicaid    Insurance/Financial Assistance Referral N/A    Medication N/A    Medical Provider Yes    Screening Referrals Made N/A    Medical Referrals Made N/A    Medical Appointment Completed N/A    CNP Interventions Advocate/Support    Screenings CN Performed N/A    ED Visit Averted N/A    Life-Saving Intervention Made N/A

## 2024-03-31 ENCOUNTER — Other Ambulatory Visit: Payer: Self-pay | Admitting: Nurse Practitioner

## 2024-03-31 DIAGNOSIS — Z1231 Encounter for screening mammogram for malignant neoplasm of breast: Secondary | ICD-10-CM

## 2024-04-19 LAB — GLUCOSE, POCT (MANUAL RESULT ENTRY): POC Glucose: 155 mg/dL — AB (ref 70–99)

## 2024-04-19 NOTE — Congregational Nurse Program (Signed)
  Dept: [phone]   Congregational Nurse Program Note  Date of Encounter: 04/19/2024  Past Medical History: Past Medical History:  Diagnosis Date   Bulging lumbar disc 2022   Diabetes mellitus without complication (HCC)    Hypertension    MI, old 2019   Sleep apnea    Vertigo     Encounter Details:  Community Questionnaire - 04/19/24 9081       Questionnaire   Ask client: Do you give verbal consent for me to treat you today? Yes    Student Assistance N/A    Location Patient Served  [facility]    Encounter Setting CN site    Population Status Unknown    Insurance Medicaid    Insurance/Financial Assistance Referral N/A    Medication N/A    Medical Provider Yes    Screening Referrals Made N/A    Medical Referrals Made N/A    Medical Appointment Completed N/A    CNP Interventions Advocate/Support    Screenings CN Performed Blood Glucose    ED Visit Averted N/A    Life-Saving Intervention Made N/A

## 2024-06-20 ENCOUNTER — Ambulatory Visit
Admission: RE | Admit: 2024-06-20 | Discharge: 2024-06-20 | Disposition: A | Discharge status: Home / Self Care | Source: Ambulatory Visit | Attending: Nurse Practitioner | Admitting: Nurse Practitioner

## 2024-06-20 DIAGNOSIS — Z1231 Encounter for screening mammogram for malignant neoplasm of breast: Secondary | ICD-10-CM | POA: Insufficient documentation

## 2024-08-19 ENCOUNTER — Other Ambulatory Visit

## 2024-08-26 ENCOUNTER — Ambulatory Visit: Admitting: Internal Medicine
